# Patient Record
Sex: Male | Born: 1945 | Race: White | Hispanic: No | Marital: Married | Smoking: Never smoker
Health system: Southern US, Community
[De-identification: ages and names within clinical notes are randomized; demographics above are authoritative.]

## PROBLEM LIST (undated history)

## (undated) DIAGNOSIS — E119 Type 2 diabetes mellitus without complications: Secondary | ICD-10-CM

## (undated) DIAGNOSIS — Z9109 Other allergy status, other than to drugs and biological substances: Secondary | ICD-10-CM

## (undated) DIAGNOSIS — K219 Gastro-esophageal reflux disease without esophagitis: Secondary | ICD-10-CM

## (undated) DIAGNOSIS — I4510 Unspecified right bundle-branch block: Secondary | ICD-10-CM

## (undated) DIAGNOSIS — F32A Depression, unspecified: Secondary | ICD-10-CM

## (undated) DIAGNOSIS — N189 Chronic kidney disease, unspecified: Secondary | ICD-10-CM

## (undated) DIAGNOSIS — Z87442 Personal history of urinary calculi: Secondary | ICD-10-CM

## (undated) DIAGNOSIS — M1990 Unspecified osteoarthritis, unspecified site: Secondary | ICD-10-CM

## (undated) DIAGNOSIS — S81801A Unspecified open wound, right lower leg, initial encounter: Secondary | ICD-10-CM

## (undated) DIAGNOSIS — F329 Major depressive disorder, single episode, unspecified: Secondary | ICD-10-CM

## (undated) DIAGNOSIS — L509 Urticaria, unspecified: Secondary | ICD-10-CM

## (undated) DIAGNOSIS — J45909 Unspecified asthma, uncomplicated: Secondary | ICD-10-CM

## (undated) DIAGNOSIS — I10 Essential (primary) hypertension: Secondary | ICD-10-CM

## (undated) DIAGNOSIS — G4730 Sleep apnea, unspecified: Secondary | ICD-10-CM

## (undated) HISTORY — DX: Depression, unspecified: F32.A

## (undated) HISTORY — PX: JOINT REPLACEMENT: SHX530

## (undated) HISTORY — DX: Unspecified asthma, uncomplicated: J45.909

## (undated) HISTORY — DX: Chronic kidney disease, unspecified: N18.9

## (undated) HISTORY — PX: SPINE SURGERY: SHX786

## (undated) HISTORY — PX: TONSILLECTOMY: SUR1361

## (undated) HISTORY — PX: EYE SURGERY: SHX253

## (undated) HISTORY — DX: Essential (primary) hypertension: I10

## (undated) HISTORY — DX: Other allergy status, other than to drugs and biological substances: Z91.09

## (undated) HISTORY — PX: TONSILLECTOMY: SHX5217

## (undated) HISTORY — DX: Type 2 diabetes mellitus without complications: E11.9

## (undated) HISTORY — DX: Major depressive disorder, single episode, unspecified: F32.9

---

## 1993-12-05 HISTORY — PX: BACK SURGERY: SHX140

## 1999-09-18 ENCOUNTER — Ambulatory Visit: Admission: RE | Admit: 1999-09-18 | Discharge: 1999-09-18 | Payer: Self-pay | Admitting: Family Medicine

## 1999-10-22 ENCOUNTER — Ambulatory Visit (HOSPITAL_COMMUNITY): Admission: RE | Admit: 1999-10-22 | Discharge: 1999-10-22 | Payer: Self-pay | Admitting: Gastroenterology

## 2004-03-09 ENCOUNTER — Encounter: Admission: RE | Admit: 2004-03-09 | Discharge: 2004-06-07 | Payer: Self-pay | Admitting: Internal Medicine

## 2005-09-29 ENCOUNTER — Ambulatory Visit: Payer: Self-pay | Admitting: Internal Medicine

## 2008-06-02 ENCOUNTER — Encounter: Payer: Self-pay | Admitting: Cardiology

## 2008-06-12 ENCOUNTER — Ambulatory Visit: Payer: Self-pay | Admitting: Cardiology

## 2008-06-19 ENCOUNTER — Ambulatory Visit: Payer: Self-pay | Admitting: Cardiology

## 2008-06-19 ENCOUNTER — Ambulatory Visit: Payer: Self-pay

## 2008-06-19 ENCOUNTER — Encounter: Payer: Self-pay | Admitting: Cardiology

## 2008-06-19 LAB — CONVERTED CEMR LAB
BUN: 14 mg/dL (ref 6–23)
Creatinine, Ser: 0.8 mg/dL (ref 0.4–1.5)
GFR calc Af Amer: 127 mL/min
GFR calc non Af Amer: 105 mL/min

## 2008-09-18 ENCOUNTER — Ambulatory Visit: Payer: Self-pay | Admitting: Internal Medicine

## 2008-09-26 ENCOUNTER — Ambulatory Visit: Payer: Self-pay | Admitting: Internal Medicine

## 2008-11-20 ENCOUNTER — Ambulatory Visit: Payer: Self-pay | Admitting: Internal Medicine

## 2009-02-06 ENCOUNTER — Ambulatory Visit: Payer: Self-pay | Admitting: Internal Medicine

## 2009-03-12 ENCOUNTER — Ambulatory Visit: Payer: Self-pay | Admitting: Internal Medicine

## 2009-05-13 DIAGNOSIS — I10 Essential (primary) hypertension: Secondary | ICD-10-CM | POA: Insufficient documentation

## 2009-05-13 DIAGNOSIS — Z9109 Other allergy status, other than to drugs and biological substances: Secondary | ICD-10-CM

## 2009-05-13 DIAGNOSIS — E119 Type 2 diabetes mellitus without complications: Secondary | ICD-10-CM | POA: Insufficient documentation

## 2009-05-13 DIAGNOSIS — J45909 Unspecified asthma, uncomplicated: Secondary | ICD-10-CM | POA: Insufficient documentation

## 2009-05-13 DIAGNOSIS — F339 Major depressive disorder, recurrent, unspecified: Secondary | ICD-10-CM | POA: Insufficient documentation

## 2009-07-13 ENCOUNTER — Ambulatory Visit: Payer: Self-pay | Admitting: Internal Medicine

## 2009-09-04 ENCOUNTER — Ambulatory Visit: Payer: Self-pay | Admitting: Internal Medicine

## 2009-09-07 ENCOUNTER — Ambulatory Visit: Payer: Self-pay | Admitting: Internal Medicine

## 2009-12-28 ENCOUNTER — Ambulatory Visit: Payer: Self-pay | Admitting: Internal Medicine

## 2009-12-28 ENCOUNTER — Encounter: Admission: RE | Admit: 2009-12-28 | Discharge: 2009-12-28 | Payer: Self-pay | Admitting: Internal Medicine

## 2010-01-08 ENCOUNTER — Ambulatory Visit: Payer: Self-pay | Admitting: Internal Medicine

## 2010-03-01 ENCOUNTER — Ambulatory Visit: Payer: Self-pay | Admitting: Internal Medicine

## 2010-11-08 ENCOUNTER — Ambulatory Visit: Payer: Self-pay | Admitting: Gastroenterology

## 2010-11-09 LAB — PATHOLOGY REPORT

## 2010-11-15 LAB — HM COLONOSCOPY: HM Colonoscopy: NORMAL

## 2010-12-26 ENCOUNTER — Encounter: Payer: Self-pay | Admitting: Internal Medicine

## 2011-04-19 NOTE — Assessment & Plan Note (Signed)
[facility] OFFICE NOTE   JASMON, GRAFFAM                        MRN:          [id number]  DATE:06/12/2008                            DOB:          [birth year]    ADDENDUM   Instead of increasing the patient's Micardis, we will leave this at 80  mg p.o. daily, and we will add Norvasc 5 mg p.o. daily.  Note, he has an  allergy to HCTZ.     Madolyn Frieze [name], MD, [facility]  Electronically Signed    BSC/MedQ  DD: 06/12/2008  DT: 06/12/2008  Job #: [id number]   cc:   Luanna Cole. Lenord Fellers, M.D.

## 2011-04-19 NOTE — Assessment & Plan Note (Signed)
[facility] OFFICE NOTE   Larry Roman, Larry Roman                        MRN:          [id number]  DATE:06/12/2008                            DOB:          06-Dec-1945    The patient is a pleasant 65 year old male who I was asked to evaluate  for hypertension, episodes of dizziness, and diaphoresis.  The patient  has no prior cardiac history.  He typically does not have dyspnea on  exertion, orthopnea, PND, pedal edema, palpitations, presyncope,  syncope, or exertional chest pain.  He recently states that he was in  the sun at approximately 10 o'clock in the morning.  He otherwise is  doing very little activity.  He developed a sensation of feeling weak  and fatigued and also was diaphoretic.  There was mild nausea.  There is  no chest pain, palpitations, or shortness of breath.  He felt that this  was most likely heat exhaustion.  He went inside and his symptoms did  improve, but he felt weak for 2 days.  He had a second episode on May 23, 2008.  At this time, again he was having diaphoresis and weakness as  well as nausea, but there was no chest pain.  This lasted for several  hours and resolved spontaneously.  His blood pressure also apparently  has been elevated and ACT was added to his baseline regimen of Micardis  80 mg p.o. daily.  However, he apparently developed a rash and swelling  in his upper extremities with this and it was discontinued.  Because of  the above, we were asked to further evaluate.   MEDICATIONS:  1. Micardis 80 mg p.o. daily.  2. Cymbalta 30 mg p.o. daily.  3. Astelin nasal spray .  4. Zantac.  5. Loratadine 10 mg p.o. daily.   He has no known drug allergies.   SOCIAL HISTORY:  He does not smoke.  He consumes an occasional beer.   FAMILY HISTORY:  His mother died of myocardial infarction at age 33.  His father had congestive heart failure.   PAST MEDICAL HISTORY:  Significant for  diet-controlled diabetes mellitus  for approximately 2 years.  He also has hypertension.  There is no  hyperlipidemia by his report.  He has had a prior tonsillectomy as well  as back surgery.  He also has a history of asthma as a child and also  allergies.  There is a history of depression by his report.   REVIEW OF SYSTEMS:  He denies any headaches, fevers, or chills.  There  is no productive cough or hemoptysis.  There is no dysphagia,  odynophagia, melena, or hematochezia.  There is no dysuria or hematuria.  No rash or seizure activity.  No orthopnea, PND, or pedal edema.  The  remaining systems are negative.   PHYSICAL EXAMINATION:  VITAL SIGNS:  Today, blood pressure is 177/95 and  his pulse is 59.  He weighs 253 pounds.  GENERAL:  He is well developed and somewhat obese.  He is in no acute  distress  at present.  He does not appear to be depressed.  SKIN:  Warm and dry.  There is no peripheral clubbing.  BACK:  Normal.  HEENT:  Normal with normal eyelids.  NECK:  Supple with a normal upstroke bilaterally.  No bruits noted.  There is no jugular venous distention and I cannot appreciate  thyromegaly.  CHEST:  Clear to auscultation with normal expansion.  CARDIOVASCULAR:  Regular rhythm with a normal S1 and S2.  There is a 2/6  systolic murmur at left sternal border.  There is no diastolic murmur  noted.  There is no S3 or S4.  ABDOMINAL:  Nontender and nondistended.  Positive bowel sounds.  No  hepatosplenomegaly.  No mass appreciated.  There is no abdominal bruit.  He has 2+ femoral pulses bilaterally.  No bruits.  EXTREMITIES:  No edema.  I can palpate no cords.  He has 2+ posterior  tibial pulses bilaterally.  NEUROLOGIC:  Grossly intact.   I do have an electrocardiogram from Dr. Beryle Quant office dated June 02, 2008.  At that time, he was in a sinus rhythm with a normal axis and  there were nonspecific ST changes.   DIAGNOSES:  1. Episodes of dizziness, nausea, and  diaphoresis.  The patient feels      that these are most likely related to heat exhaustion.  He is also      diabetic, has hypertension.  We will schedule him for a stress      Myoview for risk stratification.  If this shows no ischemia, then      we will not pursue further cardiac workup.  2. Murmur - he will also receive an echocardiogram, although this      sounds to be potentially an ejection murmur.  3. Hypertension - his blood pressure is elevated today.  He states it      typically runs in the 130-140 range.  I have asked him to increase      his Micardis to 160 mg p.o. daily.  We will check a BMET in 1 week      to follow his potassium and renal function.  He will track his      blood pressure at home and keep records and follow up with Dr.      Lenord Fellers for further adjustment of his medical regimen as indicated.  4. History of depression.  5. History of allergies.  6. Diet-controlled diabetes mellitus - Management per Dr. Lenord Fellers.   If his Myoview and echocardiogram are unremarkable, then he will see [place]  back on an as-needed basis.     Madolyn Frieze [name], MD, [facility]  Electronically Signed    BSC/MedQ  DD: 06/12/2008  DT: 06/13/2008  Job #: [id number]   cc:   Luanna Cole. Lenord Fellers, M.D.

## 2011-05-26 ENCOUNTER — Encounter: Payer: Self-pay | Admitting: Cardiovascular Disease

## 2011-11-07 ENCOUNTER — Other Ambulatory Visit: Payer: Self-pay

## 2011-11-10 ENCOUNTER — Ambulatory Visit (INDEPENDENT_AMBULATORY_CARE_PROVIDER_SITE_OTHER): Payer: PRIVATE HEALTH INSURANCE | Admitting: Internal Medicine

## 2011-11-10 ENCOUNTER — Encounter: Payer: Self-pay | Admitting: Internal Medicine

## 2011-11-10 VITALS — BP 120/70 | HR 71 | Temp 97.7°F | Wt 236.0 lb

## 2011-11-10 DIAGNOSIS — Z23 Encounter for immunization: Secondary | ICD-10-CM

## 2011-11-10 DIAGNOSIS — M549 Dorsalgia, unspecified: Secondary | ICD-10-CM

## 2011-11-10 DIAGNOSIS — Z0000 Encounter for general adult medical examination without abnormal findings: Secondary | ICD-10-CM

## 2011-11-10 DIAGNOSIS — G4700 Insomnia, unspecified: Secondary | ICD-10-CM

## 2011-11-10 DIAGNOSIS — I10 Essential (primary) hypertension: Secondary | ICD-10-CM

## 2011-11-10 DIAGNOSIS — E119 Type 2 diabetes mellitus without complications: Secondary | ICD-10-CM

## 2011-11-10 MED ORDER — ZOLPIDEM TARTRATE 10 MG PO TABS: 5.0000 mg | ORAL_TABLET | Freq: Every evening | ORAL | Status: DC | PRN

## 2011-11-10 MED ORDER — TRIAMCINOLONE ACETONIDE 0.1 % EX CREA: 1.0000 "application " | TOPICAL_CREAM | Freq: Two times a day (BID) | CUTANEOUS | Status: DC | PRN

## 2011-11-10 MED ORDER — ZOSTER VACCINE LIVE 19400 UNT/0.65ML ~~LOC~~ SOLR: 0.6500 mL | Freq: Once | SUBCUTANEOUS | Status: AC

## 2011-11-10 MED ORDER — HYDROCODONE-ACETAMINOPHEN 10-650 MG PO TABS: 1.0000 | ORAL_TABLET | Freq: Four times a day (QID) | ORAL | Status: DC | PRN

## 2011-11-10 MED ORDER — AMLODIPINE BESYLATE 10 MG PO TABS: 10.0000 mg | ORAL_TABLET | Freq: Every day | ORAL | Status: DC

## 2011-11-10 MED ORDER — AZELASTINE HCL 0.1 % NA SOLN: 1.0000 | Freq: Two times a day (BID) | NASAL | Status: DC

## 2011-11-10 MED ORDER — FUROSEMIDE 20 MG PO TABS: 20.0000 mg | ORAL_TABLET | Freq: Every day | ORAL | Status: DC

## 2011-11-10 MED ORDER — DULOXETINE HCL 30 MG PO CPEP: 30.0000 mg | ORAL_CAPSULE | Freq: Every day | ORAL | Status: DC

## 2011-11-10 MED ORDER — CYCLOBENZAPRINE HCL 10 MG PO TABS: 10.0000 mg | ORAL_TABLET | Freq: Three times a day (TID) | ORAL | Status: DC | PRN

## 2011-11-10 MED ORDER — GLUCOSE BLOOD VI STRP: 1.0000 | ORAL_STRIP | Freq: Two times a day (BID) | Status: DC

## 2011-11-10 MED ORDER — LOSARTAN POTASSIUM 100 MG PO TABS: 100.0000 mg | ORAL_TABLET | Freq: Every day | ORAL | Status: DC

## 2011-11-10 NOTE — Progress Notes (Signed)
Subjective:    Patient ID: LOU LOEWE, male    DOB: 10-08-1946, 65 y.o.   MRN: [id number]  HPI Mr. Mota is a 65 year old male with a history of hypertension, diabetes, and insomnia who presents for followup. He reports that he has been doing well. In regards to his hypertension, he has not regularly been checking his blood pressure, however he reports full compliance with his medications. He notes that he has been exercising on a regular basis by walking.  In regards to his diabetes, he did not bring a record of his blood sugars today. His diabetes has typically been diet controlled. He is due for hemoglobin A1c.  In regards to his insomnia, he reports good control of Ambien. He denies any side effects from this medication.   Outpatient Encounter Prescriptions as of 11/10/2011  Medication Sig Dispense Refill  . amLODipine (NORVASC) 10 MG tablet Take 1 tablet (10 mg total) by mouth daily.  90 tablet  3  . aspirin EC 81 MG tablet Take 81 mg by mouth daily.        Marland Kitchen azelastine (ASTELIN) 137 MCG/SPRAY nasal spray Place 1 spray into the nose 2 (two) times daily. Use in each nostril as directed  90 mL  3  . cyclobenzaprine (FLEXERIL) 10 MG tablet Take 1 tablet (10 mg total) by mouth 3 (three) times daily as needed.  30 tablet  3  . DULoxetine (CYMBALTA) 30 MG capsule Take 1 capsule (30 mg total) by mouth daily.  90 capsule  3  . furosemide (LASIX) 20 MG tablet Take 1 tablet (20 mg total) by mouth daily.  90 tablet  3  . glucose blood (ONE TOUCH TEST STRIPS) test strip 1 each by Other route 2 (two) times daily. Use as instructed  100 each  3  . HYDROcodone-acetaminophen (LORCET) 10-650 MG per tablet Take 1 tablet by mouth every 6 (six) hours as needed.  60 tablet  3  . losartan (COZAAR) 100 MG tablet Take 1 tablet (100 mg total) by mouth daily.  90 tablet  3  . triamcinolone cream (KENALOG) 0.1 % Apply 1 application topically 2 (two) times daily as needed.  30 g  3  . zolpidem (AMBIEN) 10 MG tablet  Take 0.5-1 tablets (5-10 mg total) by mouth at bedtime as needed.  30 tablet  1    Review of Systems  Constitutional: Negative for fever, chills, activity change, appetite change, fatigue and unexpected weight change.  Eyes: Negative for visual disturbance.  Respiratory: Negative for cough and shortness of breath.   Cardiovascular: Negative for chest pain, palpitations and leg swelling.  Gastrointestinal: Negative for abdominal pain and abdominal distention.  Genitourinary: Negative for dysuria, urgency and difficulty urinating.  Musculoskeletal: Negative for arthralgias and gait problem.  Skin: Negative for color change and rash.  Hematological: Negative for adenopathy.  Psychiatric/Behavioral: Negative for sleep disturbance and dysphoric mood. The patient is not nervous/anxious.    BP 120/70  Pulse 71  Temp(Src) 97.7 F (36.5 C) (Oral)  Wt 236 lb (107.049 kg)  SpO2 96%     Objective:   Physical Exam  Constitutional: He is oriented to person, place, and time. He appears well-developed and well-nourished. No distress.  HENT:  Head: Normocephalic and atraumatic.  Right Ear: External ear normal.  Left Ear: External ear normal.  Nose: Nose normal.  Mouth/Throat: Oropharynx is clear and moist. No oropharyngeal exudate.  Eyes: Conjunctivae and EOM are normal. Pupils are equal, round, and reactive to  light. Right eye exhibits no discharge. Left eye exhibits no discharge. No scleral icterus.  Neck: Normal range of motion. Neck supple. No tracheal deviation present. No thyromegaly present.  Cardiovascular: Normal rate, regular rhythm and normal heart sounds.  Exam reveals no gallop and no friction rub.   No murmur heard. Pulmonary/Chest: Effort normal and breath sounds normal. No respiratory distress. He has no wheezes. He has no rales. He exhibits no tenderness.  Abdominal: Soft. Bowel sounds are normal. He exhibits no distension and no mass. There is no tenderness. There is no rebound  and no guarding.  Musculoskeletal: Normal range of motion. He exhibits no edema.  Lymphadenopathy:    He has no cervical adenopathy.  Neurological: He is alert and oriented to person, place, and time. No cranial nerve deficit. Coordination normal.  Skin: Skin is warm and dry. No rash noted. He is not diaphoretic. No erythema. No pallor.  Psychiatric: He has a normal mood and affect. His behavior is normal. Judgment and thought content normal.          Assessment & Plan:  1. Hypertension - blood pressure well-controlled today. We'll check renal function with labs. Will continue current medications. Patient will followup in 6 months.  2. Diabetes mellitus - will check hemoglobin A1c with labs today.  3. Insomnia - will continue Ambien. Refill given today.  4. Health maintenance - flu vaccine and tetanus and pertussis vaccines given today. Prescription for her shingles vaccine also given. Will check labs today including CBC, CMP, lipid profile, and hemoglobin A1c. We discussed that benefits and risk of checking PSA. Patient would like to check PSA with labs today. Patient is up-to-date on colonoscopy which was performed one year ago.

## 2011-11-11 LAB — COMPREHENSIVE METABOLIC PANEL
Alkaline Phosphatase: 66 U/L (ref 39–117)
BUN: 16 mg/dL (ref 6–23)
CO2: 30 mEq/L (ref 19–32)
GFR: 97.67 mL/min (ref >60.00)
Glucose, Bld: 98 mg/dL (ref 70–99)
Total Bilirubin: 1 mg/dL (ref 0.3–1.2)

## 2011-11-11 LAB — CBC WITH DIFFERENTIAL/PLATELET
Basophils Relative: 0.4 % (ref 0.0–3.0)
Eosinophils Absolute: 0.3 10*3/uL (ref 0.0–0.7)
HCT: 42 % (ref 39.0–52.0)
Lymphs Abs: 1 10*3/uL (ref 0.7–4.0)
MCHC: 34.3 g/dL (ref 30.0–36.0)
MCV: 93.2 fl (ref 78.0–100.0)
Monocytes Absolute: 0.4 10*3/uL (ref 0.1–1.0)
Neutrophils Relative %: 71.8 % (ref 43.0–77.0)
Platelets: 233 10*3/uL (ref 150.0–400.0)
RBC: 4.5 Mil/uL (ref 4.22–5.81)

## 2011-11-11 LAB — MICROALBUMIN / CREATININE URINE RATIO
Creatinine,U: 169.5 mg/dL
Microalb Creat Ratio: 1.7 mg/g (ref 0.0–30.0)
Microalb, Ur: 2.9 mg/dL — ABNORMAL HIGH (ref 0.0–1.9)

## 2011-11-11 LAB — LIPID PANEL: VLDL: 42 mg/dL — ABNORMAL HIGH (ref 0.0–40.0)

## 2011-11-11 LAB — HEMOGLOBIN A1C: Hgb A1c MFr Bld: 6.2 % (ref 4.6–6.5)

## 2011-11-11 LAB — PSA: PSA: 0.39 ng/mL (ref 0.10–4.00)

## 2011-12-08 ENCOUNTER — Other Ambulatory Visit: Payer: Self-pay | Admitting: *Deleted

## 2011-12-08 DIAGNOSIS — I10 Essential (primary) hypertension: Secondary | ICD-10-CM

## 2011-12-08 MED ORDER — LOSARTAN POTASSIUM 100 MG PO TABS: 100.0000 mg | ORAL_TABLET | Freq: Every day | ORAL | Status: DC

## 2011-12-22 ENCOUNTER — Other Ambulatory Visit: Payer: Self-pay | Admitting: *Deleted

## 2011-12-22 DIAGNOSIS — G4700 Insomnia, unspecified: Secondary | ICD-10-CM

## 2011-12-22 MED ORDER — ZOLPIDEM TARTRATE 10 MG PO TABS: 5.0000 mg | ORAL_TABLET | Freq: Every evening | ORAL | Status: DC | PRN

## 2012-02-07 ENCOUNTER — Encounter: Payer: Self-pay | Admitting: Internal Medicine

## 2012-02-15 ENCOUNTER — Encounter: Payer: Self-pay | Admitting: Internal Medicine

## 2012-05-10 ENCOUNTER — Ambulatory Visit: Payer: PRIVATE HEALTH INSURANCE | Admitting: Internal Medicine

## 2012-07-09 ENCOUNTER — Other Ambulatory Visit (INDEPENDENT_AMBULATORY_CARE_PROVIDER_SITE_OTHER): Payer: Medicare Other | Admitting: *Deleted

## 2012-07-09 ENCOUNTER — Telehealth: Payer: Self-pay | Admitting: *Deleted

## 2012-07-09 DIAGNOSIS — I10 Essential (primary) hypertension: Secondary | ICD-10-CM

## 2012-07-09 DIAGNOSIS — E119 Type 2 diabetes mellitus without complications: Secondary | ICD-10-CM

## 2012-07-09 NOTE — Addendum Note (Signed)
Addended by: Jobie Quaker on: 07/09/2012 05:23 PM   Modules accepted: Orders

## 2012-07-09 NOTE — Telephone Encounter (Signed)
CMP and lipids. Thanks!

## 2012-07-09 NOTE — Telephone Encounter (Signed)
[facility] advised per this note.

## 2012-07-09 NOTE — Telephone Encounter (Signed)
Patient came in today for labs, what would you like for me to order?

## 2012-07-10 LAB — LIPID PANEL
Cholesterol: 155 mg/dL (ref 0–200)
LDL Cholesterol: 84 mg/dL (ref 0–99)
Triglycerides: 157 mg/dL — ABNORMAL HIGH (ref 0.0–149.0)

## 2012-07-10 LAB — HEMOGLOBIN A1C: Hgb A1c MFr Bld: 6.4 % (ref 4.6–6.5)

## 2012-07-10 LAB — COMPREHENSIVE METABOLIC PANEL
ALT: 28 U/L (ref 0–53)
AST: 27 U/L (ref 0–37)
Alkaline Phosphatase: 54 U/L (ref 39–117)
Calcium: 9.5 mg/dL (ref 8.4–10.5)
Chloride: 98 mEq/L (ref 96–112)
Creatinine, Ser: 0.9 mg/dL (ref 0.4–1.5)
Potassium: 4 mEq/L (ref 3.5–5.1)

## 2012-07-16 ENCOUNTER — Ambulatory Visit (INDEPENDENT_AMBULATORY_CARE_PROVIDER_SITE_OTHER): Payer: Medicare Other | Admitting: Internal Medicine

## 2012-07-16 ENCOUNTER — Encounter: Payer: Self-pay | Admitting: Internal Medicine

## 2012-07-16 VITALS — BP 160/90 | HR 60 | Temp 98.2°F | Ht 72.0 in | Wt 234.5 lb

## 2012-07-16 DIAGNOSIS — Z23 Encounter for immunization: Secondary | ICD-10-CM | POA: Diagnosis not present

## 2012-07-16 DIAGNOSIS — M549 Dorsalgia, unspecified: Secondary | ICD-10-CM

## 2012-07-16 DIAGNOSIS — I10 Essential (primary) hypertension: Secondary | ICD-10-CM

## 2012-07-16 DIAGNOSIS — E119 Type 2 diabetes mellitus without complications: Secondary | ICD-10-CM

## 2012-07-16 DIAGNOSIS — G4700 Insomnia, unspecified: Secondary | ICD-10-CM | POA: Diagnosis not present

## 2012-07-16 MED ORDER — ZOLPIDEM TARTRATE 10 MG PO TABS: 5.0000 mg | ORAL_TABLET | Freq: Every evening | ORAL | Status: DC | PRN

## 2012-07-16 MED ORDER — AZELASTINE HCL 0.1 % NA SOLN: 1.0000 | Freq: Two times a day (BID) | NASAL | Status: DC

## 2012-07-16 MED ORDER — HYDROCODONE-ACETAMINOPHEN 10-650 MG PO TABS: 1.0000 | ORAL_TABLET | Freq: Four times a day (QID) | ORAL | Status: DC | PRN

## 2012-07-16 MED ORDER — DULOXETINE HCL 30 MG PO CPEP: 30.0000 mg | ORAL_CAPSULE | Freq: Every day | ORAL | Status: DC

## 2012-07-16 MED ORDER — FUROSEMIDE 20 MG PO TABS: 20.0000 mg | ORAL_TABLET | Freq: Every day | ORAL | Status: DC

## 2012-07-16 MED ORDER — AMLODIPINE BESYLATE 10 MG PO TABS: 10.0000 mg | ORAL_TABLET | Freq: Every day | ORAL | Status: DC

## 2012-07-16 MED ORDER — CYCLOBENZAPRINE HCL 10 MG PO TABS: 10.0000 mg | ORAL_TABLET | Freq: Three times a day (TID) | ORAL | Status: DC | PRN

## 2012-07-16 MED ORDER — LOSARTAN POTASSIUM 100 MG PO TABS: 100.0000 mg | ORAL_TABLET | Freq: Every day | ORAL | Status: DC

## 2012-07-16 NOTE — Assessment & Plan Note (Signed)
Blood sugars well controlled with hemoglobin A1c of 6.4%. Encouraged him to continue efforts at exercise, setting goals for 30 minutes of walking daily. Encouraged him to continue efforts at healthy diet. Followup in 6 months.

## 2012-07-16 NOTE — Assessment & Plan Note (Signed)
Blood pressure elevated today. Patient reports compliance with medication, but notes that this has been particularly anxious time for him. We will plan to recheck blood pressure in 4 weeks. He will also monitor at home and call if consistently greater than 140/90.

## 2012-07-16 NOTE — Progress Notes (Signed)
Subjective:    Patient ID: GUIDO COMP, male    DOB: November 16, 1946, 66 y.o.   MRN: [id number]  HPI 66 year old male with history of hypertension, elevated blood sugars presents for followup. He reports he is generally doing well. He reports full compliance with his medications. He reports that he has been walking 2 miles 5 days per week in effort to improve his health. He has also been limiting his intake of sweets and fatty foods.  Outpatient Encounter Prescriptions as of 07/16/2012  Medication Sig Dispense Refill  . amLODipine (NORVASC) 10 MG tablet Take 1 tablet (10 mg total) by mouth daily.  90 tablet  3  . aspirin EC 81 MG tablet Take 81 mg by mouth daily.        Marland Kitchen azelastine (ASTELIN) 137 MCG/SPRAY nasal spray Place 1 spray into the nose 2 (two) times daily. Use in each nostril as directed  90 mL  3  . cyclobenzaprine (FLEXERIL) 10 MG tablet Take 1 tablet (10 mg total) by mouth 3 (three) times daily as needed.  90 tablet  3  . DULoxetine (CYMBALTA) 30 MG capsule Take 1 capsule (30 mg total) by mouth daily.  90 capsule  3  . furosemide (LASIX) 20 MG tablet Take 1 tablet (20 mg total) by mouth daily.  90 tablet  3  . glucose blood (ONE TOUCH TEST STRIPS) test strip 1 each by Other route 2 (two) times daily. Use as instructed  100 each  3  . HYDROcodone-acetaminophen (LORCET) 10-650 MG per tablet Take 1 tablet by mouth every 6 (six) hours as needed.  60 tablet  3  . losartan (COZAAR) 100 MG tablet Take 1 tablet (100 mg total) by mouth daily.  90 tablet  3  . zolpidem (AMBIEN) 10 MG tablet Take 0.5-1 tablets (5-10 mg total) by mouth at bedtime as needed.  90 tablet  0   BP 160/90  Pulse 60  Temp 98.2 F (36.8 C) (Oral)  Ht 6' (1.829 m)  Wt 234 lb 8 oz (106.369 kg)  BMI 31.80 kg/m2  SpO2 98%  Review of Systems  Constitutional: Negative for fever, chills, activity change, appetite change, fatigue and unexpected weight change.  Eyes: Negative for visual disturbance.  Respiratory: Negative  for cough and shortness of breath.   Cardiovascular: Negative for chest pain, palpitations and leg swelling.  Gastrointestinal: Negative for abdominal pain and abdominal distention.  Genitourinary: Negative for dysuria, urgency and difficulty urinating.  Musculoskeletal: Negative for arthralgias and gait problem.  Skin: Negative for color change and rash.  Hematological: Negative for adenopathy.  Psychiatric/Behavioral: Negative for disturbed wake/sleep cycle and dysphoric mood. The patient is not nervous/anxious.        Objective:   Physical Exam  Constitutional: He is oriented to person, place, and time. He appears well-developed and well-nourished. No distress.  HENT:  Head: Normocephalic and atraumatic.  Right Ear: External ear normal.  Left Ear: External ear normal.  Nose: Nose normal.  Mouth/Throat: Oropharynx is clear and moist. No oropharyngeal exudate.  Eyes: Conjunctivae and EOM are normal. Pupils are equal, round, and reactive to light. Right eye exhibits no discharge. Left eye exhibits no discharge. No scleral icterus.  Neck: Normal range of motion. Neck supple. No tracheal deviation present. No thyromegaly present.  Cardiovascular: Normal rate, regular rhythm and normal heart sounds.  Exam reveals no gallop and no friction rub.   No murmur heard. Pulmonary/Chest: Effort normal and breath sounds normal. No respiratory distress. He has  no wheezes. He has no rales. He exhibits no tenderness.  Musculoskeletal: Normal range of motion. He exhibits no edema.  Lymphadenopathy:    He has no cervical adenopathy.  Neurological: He is alert and oriented to person, place, and time. No cranial nerve deficit. Coordination normal.  Skin: Skin is warm and dry. No rash noted. He is not diaphoretic. No erythema. No pallor.  Psychiatric: He has a normal mood and affect. His behavior is normal. Judgment and thought content normal.          Assessment & Plan:

## 2012-07-24 ENCOUNTER — Other Ambulatory Visit: Payer: Self-pay | Admitting: *Deleted

## 2012-07-24 MED ORDER — GLUCOSE BLOOD VI STRP: 1.0000 | ORAL_STRIP | Freq: Two times a day (BID) | Status: DC

## 2012-07-31 ENCOUNTER — Other Ambulatory Visit: Payer: Self-pay | Admitting: *Deleted

## 2012-07-31 MED ORDER — GLUCOSE BLOOD VI STRP: ORAL_STRIP | Status: DC

## 2012-08-30 ENCOUNTER — Encounter: Payer: Medicare Other | Admitting: Internal Medicine

## 2012-09-10 ENCOUNTER — Encounter: Payer: Self-pay | Admitting: Internal Medicine

## 2012-09-10 ENCOUNTER — Ambulatory Visit (INDEPENDENT_AMBULATORY_CARE_PROVIDER_SITE_OTHER): Payer: Medicare Other | Admitting: Internal Medicine

## 2012-09-10 VITALS — BP 150/90 | HR 61 | Temp 98.6°F | Ht 72.0 in | Wt 234.8 lb

## 2012-09-10 DIAGNOSIS — I10 Essential (primary) hypertension: Secondary | ICD-10-CM

## 2012-09-10 DIAGNOSIS — G4700 Insomnia, unspecified: Secondary | ICD-10-CM | POA: Diagnosis not present

## 2012-09-10 DIAGNOSIS — F3289 Other specified depressive episodes: Secondary | ICD-10-CM

## 2012-09-10 DIAGNOSIS — Z23 Encounter for immunization: Secondary | ICD-10-CM

## 2012-09-10 DIAGNOSIS — Z0000 Encounter for general adult medical examination without abnormal findings: Secondary | ICD-10-CM

## 2012-09-10 DIAGNOSIS — F329 Major depressive disorder, single episode, unspecified: Secondary | ICD-10-CM

## 2012-09-10 DIAGNOSIS — I4510 Unspecified right bundle-branch block: Secondary | ICD-10-CM

## 2012-09-10 DIAGNOSIS — E119 Type 2 diabetes mellitus without complications: Secondary | ICD-10-CM

## 2012-09-10 DIAGNOSIS — N529 Male erectile dysfunction, unspecified: Secondary | ICD-10-CM

## 2012-09-10 MED ORDER — VARDENAFIL HCL 5 MG PO TABS: 5.0000 mg | ORAL_TABLET | ORAL | Status: DC | PRN

## 2012-09-10 MED ORDER — AZELASTINE HCL 0.1 % NA SOLN: 1.0000 | Freq: Two times a day (BID) | NASAL | Status: DC

## 2012-09-10 MED ORDER — LOSARTAN POTASSIUM 100 MG PO TABS: 100.0000 mg | ORAL_TABLET | Freq: Every day | ORAL | Status: DC

## 2012-09-10 MED ORDER — ZOLPIDEM TARTRATE 10 MG PO TABS: 5.0000 mg | ORAL_TABLET | Freq: Every evening | ORAL | Status: DC | PRN

## 2012-09-10 NOTE — Assessment & Plan Note (Signed)
Symptoms well controlled with Cymbalta. Will continue. Follow up 6 months and prn.

## 2012-09-10 NOTE — Assessment & Plan Note (Signed)
New RBBB noted on EKG today. Asymptomatic. However, some risk for CAD including diabetes mellitus, HTN. Will set up cardiology evaluation with possible stress test.

## 2012-09-10 NOTE — Assessment & Plan Note (Addendum)
General medical exam normal today. Health maintenance is UTD including colonoscopy, vaccinations.  Appropriate screening performed. Information on HCPOA given. Encouraged continued efforts at healthy diet and exercise.  Follow up 6 months and prn.

## 2012-09-10 NOTE — Assessment & Plan Note (Signed)
BP slightly elevated today, but well controlled at home. Will continue current medications. Follow up 6 months and prn.

## 2012-09-10 NOTE — Progress Notes (Signed)
Subjective:    Patient ID: Larry Roman, male    DOB: 06-Mar-1946, 66 y.o.   MRN: [id number]  HPI The patient is here for annual Medicare wellness examination and management of other chronic and acute problems.   The risk factors are reflected in the social history.  The roster of all physicians providing medical care to patient - is listed in the Snapshot section of the chart.  Activities of daily living:  The patient is 100% independent in all ADLs: dressing, toileting, feeding as well as independent mobility  Home safety : The patient has smoke detectors in the home. They wear seatbelts.  There are locked firearms at home. There is no violence in the home.   There is no risks for hepatitis, STDs or HIV. There is no history of blood transfusion. They have no travel history to infectious disease endemic areas of the world.  The patient has seen their dentist in the last six month. (Dr. Judy Pimple) They have seen their eye doctor in the last year. [facility]) No trouble with hearing  They have deferred audiologic testing in the last year.   They do not  have excessive sun exposure. Discussed the need for sun protection: hats, long sleeves and use of sunscreen if there is significant sun exposure.   Diet: the importance of a healthy diet is discussed. They do have a healthy diet.  The benefits of regular aerobic exercise were discussed. He walks 5 days per week 35-46min.   Depression screen: there are no signs or vegative symptoms of depression- irritability, change in appetite, anhedonia, sadness/tearfullness.  Cognitive assessment: the patient manages all their financial and personal affairs and is actively engaged. They could relate day,date,year and events.  Living Will in Place. HCPOA in place.  The following portions of the patient's history were reviewed and updated as appropriate: allergies, current medications, past family history, past medical history,  past surgical  history, past social history  and problem list.  Visual acuity was not assessed per patient preference since he has regular follow up with her ophthalmologist. Hearing and body mass index were assessed and reviewed.   During the course of the visit the patient was educated and counseled about appropriate screening and preventive services including : fall prevention , diabetes screening, nutrition counseling, colorectal cancer screening, and recommended immunizations.    He is also concerned today about some erectile dysfunction. He notes difficulty establishing and maintaining an erection during intercourse. He has never taken medications for this.  In regards to chronic issue of hypertension, he brings record of blood pressures which show average blood pressure 140/80.  Outpatient Encounter Prescriptions as of 09/10/2012  Medication Sig Dispense Refill  . amLODipine (NORVASC) 10 MG tablet Take 1 tablet (10 mg total) by mouth daily.  90 tablet  3  . aspirin EC 81 MG tablet Take 81 mg by mouth daily.        Marland Kitchen azelastine (ASTELIN) 137 MCG/SPRAY nasal spray Place 1 spray into the nose 2 (two) times daily. Use in each nostril as directed  90 mL  3  . cyclobenzaprine (FLEXERIL) 10 MG tablet Take 1 tablet (10 mg total) by mouth 3 (three) times daily as needed.  90 tablet  3  . DULoxetine (CYMBALTA) 30 MG capsule Take 1 capsule (30 mg total) by mouth daily.  90 capsule  3  . furosemide (LASIX) 20 MG tablet Take 1 tablet (20 mg total) by mouth daily.  90 tablet  3  . glucose blood test strip One Touch Test strips Dx: 250.00 Use to check blood sugar 2-3 times daily  100 each  11  . HYDROcodone-acetaminophen (LORCET) 10-650 MG per tablet Take 1 tablet by mouth every 6 (six) hours as needed.  60 tablet  3  . losartan (COZAAR) 100 MG tablet Take 1 tablet (100 mg total) by mouth daily.  90 tablet  3  . zolpidem (AMBIEN) 10 MG tablet Take 0.5-1 tablets (5-10 mg total) by mouth at bedtime as needed.  90  tablet  0   BP 150/90  Pulse 61  Temp 98.6 F (37 C) (Oral)  Ht 6' (1.829 m)  Wt 234 lb 12 oz (106.482 kg)  BMI 31.84 kg/m2  SpO2 96%  Review of Systems  Constitutional: Negative for fever, chills, activity change, appetite change, fatigue and unexpected weight change.  Eyes: Negative for visual disturbance.  Respiratory: Negative for cough and shortness of breath.   Cardiovascular: Negative for chest pain, palpitations and leg swelling.  Gastrointestinal: Negative for abdominal pain and abdominal distention.  Genitourinary: Negative for dysuria, urgency and difficulty urinating.  Musculoskeletal: Negative for arthralgias and gait problem.  Skin: Negative for color change and rash.  Hematological: Negative for adenopathy.  Psychiatric/Behavioral: Negative for disturbed wake/sleep cycle and dysphoric mood. The patient is not nervous/anxious.        Objective:   Physical Exam  Constitutional: He is oriented to person, place, and time. He appears well-developed and well-nourished. No distress.  HENT:  Head: Normocephalic and atraumatic.  Right Ear: External ear normal.  Left Ear: External ear normal.  Nose: Nose normal.  Mouth/Throat: Oropharynx is clear and moist. No oropharyngeal exudate.  Eyes: Conjunctivae normal and EOM are normal. Pupils are equal, round, and reactive to light. Right eye exhibits no discharge. Left eye exhibits no discharge. No scleral icterus.  Neck: Normal range of motion. Neck supple. No tracheal deviation present. No thyromegaly present.  Cardiovascular: Normal rate, regular rhythm and normal heart sounds.  Exam reveals no gallop and no friction rub.   No murmur heard. Pulmonary/Chest: Effort normal and breath sounds normal. No respiratory distress. He has no wheezes. He has no rales. He exhibits no tenderness.  Abdominal: Soft. Bowel sounds are normal. He exhibits no distension and no mass. There is no tenderness. There is no guarding.    Musculoskeletal: Normal range of motion. He exhibits no edema.  Lymphadenopathy:    He has no cervical adenopathy.  Neurological: He is alert and oriented to person, place, and time. No cranial nerve deficit. Coordination normal.  Skin: Skin is warm and dry. No rash noted. He is not diaphoretic. No erythema. No pallor.  Psychiatric: He has a normal mood and affect. His behavior is normal. Judgment and thought content normal.          Assessment & Plan:

## 2012-09-10 NOTE — Assessment & Plan Note (Signed)
Borderline diabetes. Diet controlled. Recent A1c showed excellent control of BG. Will continue to monitor. Follow up 6 months.

## 2012-09-10 NOTE — Assessment & Plan Note (Signed)
Symptoms of erectile dysfunction likely related to use of SSRI. Will try adding Levitra to see if any improvement. Follow up 6 months and prn.

## 2012-09-13 ENCOUNTER — Encounter: Payer: Self-pay | Admitting: Internal Medicine

## 2012-09-17 ENCOUNTER — Encounter: Payer: Self-pay | Admitting: Internal Medicine

## 2012-09-20 ENCOUNTER — Telehealth: Payer: Self-pay | Admitting: Internal Medicine

## 2012-09-20 NOTE — Telephone Encounter (Signed)
Abnormal EKG  

## 2012-09-21 ENCOUNTER — Encounter: Payer: Self-pay | Admitting: Internal Medicine

## 2012-09-24 ENCOUNTER — Encounter: Payer: Self-pay | Admitting: Internal Medicine

## 2012-09-24 DIAGNOSIS — R9431 Abnormal electrocardiogram [ECG] [EKG]: Secondary | ICD-10-CM

## 2012-10-15 ENCOUNTER — Ambulatory Visit (INDEPENDENT_AMBULATORY_CARE_PROVIDER_SITE_OTHER): Payer: Medicare Other | Admitting: Cardiovascular Disease

## 2012-10-15 ENCOUNTER — Encounter: Payer: Self-pay | Admitting: Cardiovascular Disease

## 2012-10-15 VITALS — BP 112/70 | HR 74 | Ht 72.0 in | Wt 233.2 lb

## 2012-10-15 DIAGNOSIS — I4510 Unspecified right bundle-branch block: Secondary | ICD-10-CM

## 2012-10-15 DIAGNOSIS — E119 Type 2 diabetes mellitus without complications: Secondary | ICD-10-CM | POA: Diagnosis not present

## 2012-10-15 DIAGNOSIS — I10 Essential (primary) hypertension: Secondary | ICD-10-CM

## 2012-10-15 NOTE — Assessment & Plan Note (Signed)
We have encouraged continued exercise, careful diet management in an effort to lose weight. 

## 2012-10-15 NOTE — Assessment & Plan Note (Signed)
Likely a benign finding in the setting of no symptoms. He is active, exercises on a regular basis. His other risk factors are well controlled with excellent cholesterol, low blood pressure and hemoglobin A1c. Nonsmoker. We have discussed the various treatment options with him which included medical management as he is doing and possibly a stress test. After much discussion, stress test will not be ordered at this time as he is asymptomatic. He will call us if he has any symptoms of shortness of breath or chest discomfort concerning for angina.

## 2012-10-15 NOTE — Progress Notes (Signed)
Patient ID: Larry Roman, male    DOB: 02/24/1946, 66 y.o.   MRN: [id number]  HPI Comments: 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 He is very active at baseline. 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 He does have a relatively vigorous space with no symptoms. Last weekend, he went "zip lining"and [place] and reported no problems.   He was surprised to hear he had a new change noted on his EKG.  Prior EKGs from 2009 showed normal sinus rhythm. Recent EKG showed new right bundle branch block since 2009. Repeat EKG today shows normal sinus rhythm with rate 74 beats per minute, right bundle branch block, no other significant ST or T wave changes     Outpatient Encounter Prescriptions as of 10/15/2012  Medication Sig Dispense Refill  . amLODipine (NORVASC) 10 MG tablet Take 1 tablet (10 mg total) by mouth daily.  90 tablet  3  . aspirin EC 81 MG tablet Take 81 mg by mouth daily.        Marland Kitchen azelastine (ASTELIN) 137 MCG/SPRAY nasal spray Place 1 spray into the nose 2 (two) times daily. Use in each nostril as directed  90 mL  3  . cyclobenzaprine (FLEXERIL) 10 MG tablet Take 1 tablet (10 mg total) by mouth 3 (three) times daily as needed.  90 tablet  3  . DULoxetine (CYMBALTA) 30 MG capsule Take 1 capsule (30 mg total) by mouth daily.  90 capsule  3  . furosemide (LASIX) 20 MG tablet Take 1 tablet (20 mg total) by mouth daily.  90 tablet  3  . glucose blood test strip One Touch Test strips Dx: 250.00 Use to check blood sugar 2-3 times daily  100 each  11  . HYDROcodone-acetaminophen (LORCET) 10-650 MG per tablet Take 1 tablet by mouth every 6 (six) hours as  needed.  60 tablet  3  . losartan (COZAAR) 100 MG tablet Take 1 tablet (100 mg total) by mouth daily.  90 tablet  3  . vardenafil (LEVITRA) 5 MG tablet Take 1 tablet (5 mg total) by mouth as needed for erectile dysfunction.  10 tablet  0  . zolpidem (AMBIEN) 10 MG tablet Take 0.5-1 tablets (5-10 mg total) by mouth at bedtime as needed.  90 tablet  0    Review of Systems  Constitutional: Negative.   HENT: Negative.   Eyes: Negative.   Respiratory: Negative.   Cardiovascular: Negative.   Gastrointestinal: Negative.   Musculoskeletal: Negative.   Skin: Negative.   Neurological: Negative.   Hematological: Negative.   Psychiatric/Behavioral: Negative.   All other systems reviewed and are negative.    BP 112/70  Pulse 74  Ht 6' (1.829 m)  Wt 233 lb 4 oz (105.802 kg)  BMI 31.63 kg/m2  Physical Exam  Nursing note and vitals reviewed. Constitutional: He is oriented to person, place, and time. He appears well-developed and well-nourished.  HENT:  Head: Normocephalic.  Nose: Nose normal.  Mouth/Throat: Oropharynx is clear and moist.  Eyes: Conjunctivae normal are normal. Pupils are equal, round, and reactive to light.  Neck: Normal range of motion. Neck supple. No JVD present.  Cardiovascular: Normal rate, regular  rhythm, S1 normal, S2 normal, normal heart sounds and intact distal pulses.  Exam reveals no gallop and no friction rub.   No murmur heard. Pulmonary/Chest: Effort normal and breath sounds normal. No respiratory distress. He has no wheezes. He has no rales. He exhibits no tenderness.  Abdominal: Soft. Bowel sounds are normal. He exhibits no distension. There is no tenderness.  Musculoskeletal: Normal range of motion. He exhibits no edema and no tenderness.  Lymphadenopathy:    He has no cervical adenopathy.  Neurological: He is alert and oriented to person, place, and time. Coordination normal.  Skin: Skin is warm and dry. No rash noted. No erythema.  Psychiatric: He has  a normal mood and affect. His behavior is normal. Judgment and thought content normal.           Assessment and Plan

## 2012-10-15 NOTE — Patient Instructions (Addendum)
You are doing well. No medication changes were made.  No further testing at this time Please call the office if you have shortness of breath or chest pain with exertion  Please call us if you have new issues that need to be addressed before your next appt.

## 2012-10-15 NOTE — Assessment & Plan Note (Signed)
Blood pressure is well controlled on today's visit. No changes made to the medications. 

## 2012-12-14 ENCOUNTER — Encounter: Payer: Self-pay | Admitting: Internal Medicine

## 2012-12-14 ENCOUNTER — Telehealth: Payer: Self-pay | Admitting: Internal Medicine

## 2012-12-14 ENCOUNTER — Ambulatory Visit (INDEPENDENT_AMBULATORY_CARE_PROVIDER_SITE_OTHER): Payer: Medicare Other | Admitting: Internal Medicine

## 2012-12-14 VITALS — BP 170/92 | HR 77 | Temp 98.2°F | Ht 72.0 in | Wt 235.8 lb

## 2012-12-14 DIAGNOSIS — N390 Urinary tract infection, site not specified: Secondary | ICD-10-CM

## 2012-12-14 LAB — POCT URINALYSIS DIPSTICK
Protein, UA: 30
Spec Grav, UA: 1.015
Urobilinogen, UA: 0.2

## 2012-12-14 MED ORDER — CIPROFLOXACIN HCL 500 MG PO TABS: 500.0000 mg | ORAL_TABLET | Freq: Two times a day (BID) | ORAL | Status: DC

## 2012-12-14 NOTE — Telephone Encounter (Signed)
Repeat UA next week

## 2012-12-14 NOTE — Assessment & Plan Note (Signed)
Symptoms and urinalysis consistent with UTI. Will treat empirically with cipro x 7 days. Will send urine for culture. Will repeat UA in 1 week to ensure clearance. We discussed potential longer course of treatment given risk prostatitis if UA positive in 1 week.

## 2012-12-14 NOTE — Progress Notes (Signed)
Subjective:    Patient ID: Larry Roman, male    DOB: 1946/03/31, 67 y.o.   MRN: [id number]  HPI 67 year old male with history of hypertension presents for acute visit complaining of several days of increased urinary frequency, urgency, dysuria, and low back pain. He denies any fever or chills. He has been taking Pyridium with some improvement in symptoms.  Outpatient Encounter Prescriptions as of 12/14/2012  Medication Sig Dispense Refill  . amLODipine (NORVASC) 10 MG tablet Take 1 tablet (10 mg total) by mouth daily.  90 tablet  3  . aspirin EC 81 MG tablet Take 81 mg by mouth daily.        Marland Kitchen azelastine (ASTELIN) 137 MCG/SPRAY nasal spray Place 1 spray into the nose 2 (two) times daily. Use in each nostril as directed  90 mL  3  . cyclobenzaprine (FLEXERIL) 10 MG tablet Take 1 tablet (10 mg total) by mouth 3 (three) times daily as needed.  90 tablet  3  . DULoxetine (CYMBALTA) 30 MG capsule Take 1 capsule (30 mg total) by mouth daily.  90 capsule  3  . furosemide (LASIX) 20 MG tablet Take 1 tablet (20 mg total) by mouth daily.  90 tablet  3  . glucose blood test strip One Touch Test strips Dx: 250.00 Use to check blood sugar 2-3 times daily  100 each  11  . HYDROcodone-acetaminophen (LORCET) 10-650 MG per tablet Take 1 tablet by mouth every 6 (six) hours as needed.  60 tablet  3  . losartan (COZAAR) 100 MG tablet Take 1 tablet (100 mg total) by mouth daily.  90 tablet  3  . vardenafil (LEVITRA) 5 MG tablet Take 1 tablet (5 mg total) by mouth as needed for erectile dysfunction.  10 tablet  0  . zolpidem (AMBIEN) 10 MG tablet Take 0.5-1 tablets (5-10 mg total) by mouth at bedtime as needed.  90 tablet  0   BP 170/92  Pulse 77  Temp 98.2 F (36.8 C) (Oral)  Ht 6' (1.829 m)  Wt 235 lb 12 oz (106.935 kg)  BMI 31.97 kg/m2  SpO2 96%  Review of Systems  Constitutional: Negative for fever, chills, activity change, appetite change, fatigue and unexpected weight change.  Eyes: Negative for  visual disturbance.  Respiratory: Negative for cough and shortness of breath.   Cardiovascular: Negative for chest pain, palpitations and leg swelling.  Gastrointestinal: Negative for abdominal pain and abdominal distention.  Genitourinary: Positive for dysuria, urgency and frequency. Negative for hematuria, penile swelling, scrotal swelling, difficulty urinating, penile pain and testicular pain.  Musculoskeletal: Positive for back pain (low back). Negative for arthralgias and gait problem.  Skin: Negative for color change and rash.  Hematological: Negative for adenopathy.  Psychiatric/Behavioral: Negative for sleep disturbance and dysphoric mood. The patient is not nervous/anxious.        Objective:   Physical Exam  Constitutional: He is oriented to person, place, and time. He appears well-developed and well-nourished. No distress.  HENT:  Head: Normocephalic and atraumatic.  Right Ear: External ear normal.  Left Ear: External ear normal.  Nose: Nose normal.  Mouth/Throat: Oropharynx is clear and moist.  Eyes: Conjunctivae normal and EOM are normal. Pupils are equal, round, and reactive to light. Right eye exhibits no discharge. Left eye exhibits no discharge. No scleral icterus.  Neck: Normal range of motion. Neck supple. No tracheal deviation present. No thyromegaly present.  Cardiovascular: Normal rate, regular rhythm and normal heart sounds.  Exam reveals no  gallop and no friction rub.   No murmur heard. Pulmonary/Chest: Effort normal and breath sounds normal. No respiratory distress. He has no wheezes. He has no rales. He exhibits no tenderness.  Abdominal: There is no tenderness (no CVA tenderness).  Musculoskeletal: Normal range of motion. He exhibits no edema.  Lymphadenopathy:    He has no cervical adenopathy.  Neurological: He is alert and oriented to person, place, and time. No cranial nerve deficit. Coordination normal.  Skin: Skin is warm and dry. No rash noted. He is not  diaphoretic. No erythema. No pallor.  Psychiatric: He has a normal mood and affect. His behavior is normal. Judgment and thought content normal.          Assessment & Plan:

## 2012-12-16 ENCOUNTER — Encounter: Payer: Self-pay | Admitting: Internal Medicine

## 2012-12-17 ENCOUNTER — Encounter: Payer: Self-pay | Admitting: Internal Medicine

## 2012-12-20 ENCOUNTER — Telehealth: Payer: Self-pay | Admitting: *Deleted

## 2012-12-20 NOTE — Telephone Encounter (Signed)
This pt is coming in for labs tomorrow (01.17.2014). Was it just for a urine sample?

## 2012-12-20 NOTE — Telephone Encounter (Signed)
Repeat urinalysis because of hematuria.

## 2012-12-21 ENCOUNTER — Telehealth: Payer: Self-pay | Admitting: *Deleted

## 2012-12-21 ENCOUNTER — Other Ambulatory Visit (HOSPITAL_COMMUNITY)
Admission: RE | Admit: 2012-12-21 | Discharge: 2012-12-21 | Disposition: A | Discharge status: Home / Self Care | Payer: Medicare Other | Source: Ambulatory Visit | Attending: Internal Medicine | Admitting: Internal Medicine

## 2012-12-21 ENCOUNTER — Other Ambulatory Visit (INDEPENDENT_AMBULATORY_CARE_PROVIDER_SITE_OTHER): Payer: Medicare Other

## 2012-12-21 DIAGNOSIS — R319 Hematuria, unspecified: Secondary | ICD-10-CM | POA: Insufficient documentation

## 2012-12-21 DIAGNOSIS — D414 Neoplasm of uncertain behavior of bladder: Secondary | ICD-10-CM | POA: Diagnosis not present

## 2012-12-21 LAB — POCT URINALYSIS DIPSTICK
Bilirubin, UA: NEGATIVE
Glucose, UA: NEGATIVE
Nitrite, UA: NEGATIVE
Spec Grav, UA: <=1.005

## 2012-12-21 NOTE — Telephone Encounter (Signed)
Yes, please.

## 2012-12-21 NOTE — Telephone Encounter (Signed)
For this pt would you like a urine culture done?  

## 2012-12-23 LAB — URINE CULTURE
Colony Count: NO GROWTH
Organism ID, Bacteria: NO GROWTH

## 2012-12-24 ENCOUNTER — Other Ambulatory Visit: Payer: Self-pay | Admitting: Internal Medicine

## 2012-12-24 ENCOUNTER — Encounter: Payer: Self-pay | Admitting: Internal Medicine

## 2012-12-24 DIAGNOSIS — R319 Hematuria, unspecified: Secondary | ICD-10-CM

## 2012-12-24 DIAGNOSIS — R8289 Other abnormal findings on cytological and histological examination of urine: Secondary | ICD-10-CM

## 2012-12-27 ENCOUNTER — Other Ambulatory Visit: Payer: Medicare Other

## 2012-12-28 ENCOUNTER — Ambulatory Visit: Payer: Medicare Other | Admitting: Internal Medicine

## 2013-01-01 DIAGNOSIS — N410 Acute prostatitis: Secondary | ICD-10-CM | POA: Diagnosis not present

## 2013-01-01 DIAGNOSIS — N401 Enlarged prostate with lower urinary tract symptoms: Secondary | ICD-10-CM | POA: Diagnosis not present

## 2013-01-14 ENCOUNTER — Encounter: Payer: Self-pay | Admitting: Internal Medicine

## 2013-01-14 DIAGNOSIS — N390 Urinary tract infection, site not specified: Secondary | ICD-10-CM

## 2013-01-14 MED ORDER — CIPROFLOXACIN HCL 500 MG PO TABS: 500.0000 mg | ORAL_TABLET | Freq: Two times a day (BID) | ORAL | Status: DC

## 2013-01-15 ENCOUNTER — Encounter: Payer: Self-pay | Admitting: Internal Medicine

## 2013-01-15 ENCOUNTER — Other Ambulatory Visit: Payer: Medicare Other

## 2013-01-15 DIAGNOSIS — N390 Urinary tract infection, site not specified: Secondary | ICD-10-CM

## 2013-01-16 ENCOUNTER — Encounter: Payer: Self-pay | Admitting: Internal Medicine

## 2013-01-16 ENCOUNTER — Other Ambulatory Visit: Payer: Medicare Other

## 2013-01-18 LAB — URINE CULTURE: Organism ID, Bacteria: NO GROWTH

## 2013-01-28 ENCOUNTER — Other Ambulatory Visit: Payer: Self-pay | Admitting: Internal Medicine

## 2013-01-28 ENCOUNTER — Encounter: Payer: Self-pay | Admitting: Internal Medicine

## 2013-01-28 NOTE — Telephone Encounter (Signed)
If pt is not completely out - I would hold this refill for Dr Dan Humphreys since she will return in 2 days.  (since the rx is for 90 tablets. )

## 2013-01-29 ENCOUNTER — Encounter: Payer: Self-pay | Admitting: Internal Medicine

## 2013-01-29 DIAGNOSIS — G4700 Insomnia, unspecified: Secondary | ICD-10-CM

## 2013-01-29 NOTE — Telephone Encounter (Signed)
This has been addressed in a previous encounter.

## 2013-01-30 MED ORDER — ZOLPIDEM TARTRATE 10 MG PO TABS: 5.0000 mg | ORAL_TABLET | Freq: Every evening | ORAL | Status: DC | PRN

## 2013-01-30 NOTE — Telephone Encounter (Signed)
Fine to fill. 

## 2013-01-30 NOTE — Telephone Encounter (Signed)
Requesting refill on:    Ambien

## 2013-01-30 NOTE — Telephone Encounter (Signed)
Rx has been sent to the pharmacy

## 2013-01-31 ENCOUNTER — Encounter: Payer: Self-pay | Admitting: Internal Medicine

## 2013-02-22 ENCOUNTER — Encounter: Payer: Self-pay | Admitting: Internal Medicine

## 2013-02-22 ENCOUNTER — Telehealth: Payer: Self-pay | Admitting: Internal Medicine

## 2013-02-22 NOTE — Telephone Encounter (Signed)
notes

## 2013-03-04 ENCOUNTER — Other Ambulatory Visit (INDEPENDENT_AMBULATORY_CARE_PROVIDER_SITE_OTHER): Payer: Medicare Other

## 2013-03-04 ENCOUNTER — Encounter: Payer: Self-pay | Admitting: Internal Medicine

## 2013-03-04 DIAGNOSIS — Z139 Encounter for screening, unspecified: Secondary | ICD-10-CM

## 2013-03-04 LAB — POCT URINALYSIS DIPSTICK
Glucose, UA: NEGATIVE
Leukocytes, UA: NEGATIVE
Nitrite, UA: NEGATIVE
Urobilinogen, UA: 0.2
pH, UA: 5.5

## 2013-03-07 ENCOUNTER — Encounter: Payer: Self-pay | Admitting: Internal Medicine

## 2013-03-22 ENCOUNTER — Ambulatory Visit: Payer: Medicare Other | Admitting: Internal Medicine

## 2013-03-25 ENCOUNTER — Encounter: Payer: Self-pay | Admitting: Internal Medicine

## 2013-03-25 ENCOUNTER — Ambulatory Visit (INDEPENDENT_AMBULATORY_CARE_PROVIDER_SITE_OTHER): Payer: Medicare Other | Admitting: Internal Medicine

## 2013-03-25 VITALS — BP 156/96 | HR 84 | Temp 98.4°F | Wt 233.0 lb

## 2013-03-25 DIAGNOSIS — R300 Dysuria: Secondary | ICD-10-CM | POA: Diagnosis not present

## 2013-03-25 DIAGNOSIS — Z9109 Other allergy status, other than to drugs and biological substances: Secondary | ICD-10-CM

## 2013-03-25 DIAGNOSIS — I10 Essential (primary) hypertension: Secondary | ICD-10-CM

## 2013-03-25 DIAGNOSIS — R5383 Other fatigue: Secondary | ICD-10-CM | POA: Diagnosis not present

## 2013-03-25 DIAGNOSIS — R5381 Other malaise: Secondary | ICD-10-CM

## 2013-03-25 DIAGNOSIS — E119 Type 2 diabetes mellitus without complications: Secondary | ICD-10-CM | POA: Diagnosis not present

## 2013-03-25 DIAGNOSIS — G4700 Insomnia, unspecified: Secondary | ICD-10-CM | POA: Diagnosis not present

## 2013-03-25 DIAGNOSIS — M549 Dorsalgia, unspecified: Secondary | ICD-10-CM

## 2013-03-25 DIAGNOSIS — F329 Major depressive disorder, single episode, unspecified: Secondary | ICD-10-CM | POA: Diagnosis not present

## 2013-03-25 DIAGNOSIS — J45909 Unspecified asthma, uncomplicated: Secondary | ICD-10-CM

## 2013-03-25 DIAGNOSIS — N390 Urinary tract infection, site not specified: Secondary | ICD-10-CM

## 2013-03-25 DIAGNOSIS — I4510 Unspecified right bundle-branch block: Secondary | ICD-10-CM

## 2013-03-25 DIAGNOSIS — N529 Male erectile dysfunction, unspecified: Secondary | ICD-10-CM

## 2013-03-25 LAB — POCT URINALYSIS DIPSTICK
Blood, UA: NEGATIVE
Glucose, UA: NEGATIVE
Nitrite, UA: NEGATIVE
Protein, UA: NEGATIVE
Urobilinogen, UA: 0.2

## 2013-03-25 LAB — VITAMIN B12: Vitamin B-12: 268 pg/mL (ref 211–911)

## 2013-03-25 LAB — COMPREHENSIVE METABOLIC PANEL
ALT: 35 U/L (ref 0–53)
AST: 28 U/L (ref 0–37)
Albumin: 4.6 g/dL (ref 3.5–5.2)
BUN: 9 mg/dL (ref 6–23)
Calcium: 9.3 mg/dL (ref 8.4–10.5)
Chloride: 99 mEq/L (ref 96–112)
Potassium: 3.9 mEq/L (ref 3.5–5.1)
Sodium: 137 mEq/L (ref 135–145)
Total Protein: 7.8 g/dL (ref 6.0–8.3)

## 2013-03-25 LAB — CBC WITH DIFFERENTIAL/PLATELET
Basophils Relative: 0.6 % (ref 0.0–3.0)
Eosinophils Absolute: 0.2 10*3/uL (ref 0.0–0.7)
Lymphocytes Relative: 15.7 % (ref 12.0–46.0)
MCHC: 34.6 g/dL (ref 30.0–36.0)
Neutrophils Relative %: 74.8 % (ref 43.0–77.0)
Platelets: 274 10*3/uL (ref 150.0–400.0)
RBC: 4.6 Mil/uL (ref 4.22–5.81)
WBC: 7.3 10*3/uL (ref 4.5–10.5)

## 2013-03-25 MED ORDER — ZOLPIDEM TARTRATE 10 MG PO TABS: 5.0000 mg | ORAL_TABLET | Freq: Every evening | ORAL | Status: DC | PRN

## 2013-03-25 MED ORDER — FUROSEMIDE 20 MG PO TABS: 20.0000 mg | ORAL_TABLET | Freq: Every day | ORAL | Status: DC

## 2013-03-25 MED ORDER — AMLODIPINE BESYLATE 10 MG PO TABS: 10.0000 mg | ORAL_TABLET | Freq: Every day | ORAL | Status: DC

## 2013-03-25 MED ORDER — DULOXETINE HCL 30 MG PO CPEP: 30.0000 mg | ORAL_CAPSULE | Freq: Every day | ORAL | Status: DC

## 2013-03-25 MED ORDER — LOSARTAN POTASSIUM 100 MG PO TABS: 100.0000 mg | ORAL_TABLET | Freq: Every day | ORAL | Status: DC

## 2013-03-25 NOTE — Assessment & Plan Note (Signed)
Persistent symptoms of fatigue. No focal symptoms such as chest pain, dyspnea, change in bowel habits or appetite. Suspect symptoms may be related to ongoing issues with prostatitis. Will also check CMP, CBC, TSH, B12 with labs today. Follow up 4 weeks. If no improvement, will consider sleep study.

## 2013-03-25 NOTE — Addendum Note (Signed)
Addended by: Baldomero Lamy on: 03/25/2013 01:19 PM   Modules accepted: Orders

## 2013-03-25 NOTE — Progress Notes (Signed)
Subjective:    Patient ID: Larry Roman, male    DOB: 11-29-1946, 67 y.o.   MRN: [id number]  HPI 67YO male with history of hypertension and diet-controlled diabetes presents for followup. At his last visit he was noted to have hematuria and a urine cytology was abnormal. He was referred to urology. Repeat urinalysis was negative for blood. Repeat cytology was not performed. He was treated for suspected prostatitis with Bactrim x2 weeks and then Cipro times one week. Despite this, he continues to have increased urinary frequency and some dysuria. He denies fever, chills, flank pain.   He also notes ongoing fatigue. He attributes this in part to waking up several times during the night to urinate. He denies focal symptoms such as chest pain, shortness of breath, change in appetite.  In regards to BP, he notes typically at home BP 130s/80s. He is compliant with medications. He denies chest pain, headache, palpitations.  Outpatient Encounter Prescriptions as of 03/25/2013  Medication Sig Dispense Refill  . amLODipine (NORVASC) 10 MG tablet Take 1 tablet (10 mg total) by mouth daily.  90 tablet  3  . aspirin EC 81 MG tablet Take 81 mg by mouth daily.        Marland Kitchen azelastine (ASTELIN) 137 MCG/SPRAY nasal spray Place 1 spray into the nose 2 (two) times daily. Use in each nostril as directed  90 mL  3  . cyclobenzaprine (FLEXERIL) 10 MG tablet Take 1 tablet (10 mg total) by mouth 3 (three) times daily as needed.  90 tablet  3  . DULoxetine (CYMBALTA) 30 MG capsule Take 1 capsule (30 mg total) by mouth daily.  90 capsule  3  . furosemide (LASIX) 20 MG tablet Take 1 tablet (20 mg total) by mouth daily.  90 tablet  3  . glucose blood test strip One Touch Test strips Dx: 250.00 Use to check blood sugar 2-3 times daily  100 each  11  . HYDROcodone-acetaminophen (LORCET) 10-650 MG per tablet Take 1 tablet by mouth every 6 (six) hours as needed.  60 tablet  3  . losartan (COZAAR) 100 MG tablet Take 1 tablet (100  mg total) by mouth daily.  90 tablet  3  . vardenafil (LEVITRA) 5 MG tablet Take 1 tablet (5 mg total) by mouth as needed for erectile dysfunction.  10 tablet  0  . zolpidem (AMBIEN) 10 MG tablet Take 0.5-1 tablets (5-10 mg total) by mouth at bedtime as needed.  90 tablet  0   No facility-administered encounter medications on file as of 03/25/2013.   BP 156/96  Pulse 84  Temp(Src) 98.4 F (36.9 C) (Oral)  Wt 233 lb (105.688 kg)  BMI 31.59 kg/m2  SpO2 97%  Review of Systems  Constitutional: Positive for diaphoresis. Negative for fever, chills, activity change, appetite change, fatigue and unexpected weight change.  Eyes: Negative for visual disturbance.  Respiratory: Negative for cough and shortness of breath.   Cardiovascular: Negative for chest pain, palpitations and leg swelling.  Gastrointestinal: Negative for abdominal pain and abdominal distention.  Genitourinary: Positive for dysuria, urgency and frequency. Negative for hematuria, flank pain, discharge, penile swelling, difficulty urinating, genital sores and penile pain.  Musculoskeletal: Negative for arthralgias and gait problem.  Skin: Negative for color change and rash.  Hematological: Negative for adenopathy.  Psychiatric/Behavioral: Negative for sleep disturbance and dysphoric mood. The patient is not nervous/anxious.        Objective:   Physical Exam  Constitutional: He is oriented to  person, place, and time. He appears well-developed and well-nourished. No distress.  HENT:  Head: Normocephalic and atraumatic.  Right Ear: External ear normal.  Left Ear: External ear normal.  Nose: Nose normal.  Mouth/Throat: Oropharynx is clear and moist. No oropharyngeal exudate.  Eyes: Conjunctivae and EOM are normal. Pupils are equal, round, and reactive to light. Right eye exhibits no discharge. Left eye exhibits no discharge. No scleral icterus.  Neck: Normal range of motion. Neck supple. No tracheal deviation present. No  thyromegaly present.  Cardiovascular: Normal rate, regular rhythm and normal heart sounds.  Exam reveals no gallop and no friction rub.   No murmur heard. Pulmonary/Chest: Effort normal and breath sounds normal. No accessory muscle usage. Not tachypneic. No respiratory distress. He has no decreased breath sounds. He has no wheezes. He has no rhonchi. He has no rales. He exhibits no tenderness.  Musculoskeletal: Normal range of motion. He exhibits no edema.  Lymphadenopathy:    He has no cervical adenopathy.  Neurological: He is alert and oriented to person, place, and time. No cranial nerve deficit. Coordination normal.  Skin: Skin is warm and dry. No rash noted. He is not diaphoretic. No erythema. No pallor.  Psychiatric: He has a normal mood and affect. His behavior is normal. Judgment and thought content normal.          Assessment & Plan:

## 2013-03-25 NOTE — Assessment & Plan Note (Signed)
BP Readings from Last 3 Encounters:  03/25/13 156/96  12/14/12 170/92  10/15/12 112/70   Blood pressure initially elevated on arrival at clinic however then improved on recheck. We'll continue to monitor. Patient will call consistently greater than 150/90.

## 2013-03-25 NOTE — Assessment & Plan Note (Signed)
Will check A1c with labs today. 

## 2013-03-25 NOTE — Assessment & Plan Note (Signed)
Persistent dysuria and increased frequency. Urine culture x 2 negative. Will repeat urine cytology, which was abnormal in the past. If persistently abnormal, will set up follow up with urology.

## 2013-03-26 ENCOUNTER — Encounter: Payer: Self-pay | Admitting: Internal Medicine

## 2013-03-26 ENCOUNTER — Ambulatory Visit: Payer: Medicare Other

## 2013-03-26 DIAGNOSIS — E1059 Type 1 diabetes mellitus with other circulatory complications: Secondary | ICD-10-CM

## 2013-03-26 LAB — URINE CULTURE
Colony Count: NO GROWTH
Organism ID, Bacteria: NO GROWTH

## 2013-03-26 LAB — HEMOGLOBIN A1C: Hgb A1c MFr Bld: 6.7 % — ABNORMAL HIGH (ref 4.6–6.5)

## 2013-04-25 ENCOUNTER — Ambulatory Visit (INDEPENDENT_AMBULATORY_CARE_PROVIDER_SITE_OTHER): Payer: Medicare Other | Admitting: Internal Medicine

## 2013-04-25 ENCOUNTER — Encounter: Payer: Self-pay | Admitting: Internal Medicine

## 2013-04-25 VITALS — BP 136/74 | HR 70 | Temp 98.6°F | Wt 232.0 lb

## 2013-04-25 DIAGNOSIS — N419 Inflammatory disease of prostate, unspecified: Secondary | ICD-10-CM | POA: Diagnosis not present

## 2013-04-25 DIAGNOSIS — M549 Dorsalgia, unspecified: Secondary | ICD-10-CM | POA: Diagnosis not present

## 2013-04-25 LAB — POCT URINALYSIS DIPSTICK
Bilirubin, UA: NEGATIVE
Glucose, UA: NEGATIVE
Ketones, UA: NEGATIVE
Leukocytes, UA: NEGATIVE
Nitrite, UA: NEGATIVE

## 2013-04-25 MED ORDER — HYDROCODONE-ACETAMINOPHEN 10-650 MG PO TABS: 1.0000 | ORAL_TABLET | Freq: Four times a day (QID) | ORAL | Status: DC | PRN

## 2013-04-25 NOTE — Assessment & Plan Note (Signed)
Chronic low back pain well controlled with prn Hydrocodone. Will continue.

## 2013-04-25 NOTE — Assessment & Plan Note (Signed)
Recent h/o prostatitis. Symptoms not completely resolved. Having some dysuria. Will repeat urinalysis and will check PSA with labs today. Will plan to set up new urology evaluation if labs show signs of persistent infection.

## 2013-04-25 NOTE — Progress Notes (Signed)
Subjective:    Patient ID: Larry Roman, male    DOB: [birth year], 67 y.o.   MRN: [id number]  HPI 67 year old male with history of diabetes, hypertension, chronic low back pain, and recent episode of prostatitis presents for followup. He reports persistent symptoms of burning pain in his genital area. He denies any increased urinary frequency, hematuria, fever, chills, flank pain. He denies any change in urine flow. He completed one month course of antibiotics. He was seen by urology. Aside from this, he reports he is generally feeling well.  Chronic low back pain has generally been well-controlled with intermittent use of hydrocodone. Blood sugars have been well-controlled and he is compliant with healthy, low carbohydrate diet. Last A1c in April 2014 was 6.7%.  Outpatient Encounter Prescriptions as of 04/25/2013  Medication Sig Dispense Refill  . amLODipine (NORVASC) 10 MG tablet Take 1 tablet (10 mg total) by mouth daily.  90 tablet  3  . aspirin EC 81 MG tablet Take 81 mg by mouth daily.        Marland Kitchen azelastine (ASTELIN) 137 MCG/SPRAY nasal spray Place 1 spray into the nose 2 (two) times daily. Use in each nostril as directed  90 mL  3  . cyclobenzaprine (FLEXERIL) 10 MG tablet Take 1 tablet (10 mg total) by mouth 3 (three) times daily as needed.  90 tablet  3  . DULoxetine (CYMBALTA) 30 MG capsule Take 1 capsule (30 mg total) by mouth daily.  90 capsule  3  . furosemide (LASIX) 20 MG tablet Take 1 tablet (20 mg total) by mouth daily.  90 tablet  3  . glucose blood test strip One Touch Test strips Dx: 250.00 Use to check blood sugar 2-3 times daily  100 each  11  . HYDROcodone-acetaminophen (LORCET) 10-650 MG per tablet Take 1 tablet by mouth every 6 (six) hours as needed.  60 tablet  3  . losartan (COZAAR) 100 MG tablet Take 1 tablet (100 mg total) by mouth daily.  90 tablet  3  . vardenafil (LEVITRA) 5 MG tablet Take 1 tablet (5 mg total) by mouth as needed for erectile dysfunction.  10 tablet   0  . zolpidem (AMBIEN) 10 MG tablet Take 0.5-1 tablets (5-10 mg total) by mouth at bedtime as needed.  90 tablet  0  . [DISCONTINUED] HYDROcodone-acetaminophen (LORCET) 10-650 MG per tablet Take 1 tablet by mouth every 6 (six) hours as needed.  60 tablet  3   No facility-administered encounter medications on file as of 04/25/2013.   BP 136/74  Pulse 70  Temp(Src) 98.6 F (37 C) (Oral)  Wt 232 lb (105.235 kg)  BMI 31.46 kg/m2  SpO2 95%  Review of Systems  Constitutional: Negative for fever, chills, activity change, appetite change, fatigue and unexpected weight change.  Eyes: Negative for visual disturbance.  Respiratory: Negative for cough and shortness of breath.   Cardiovascular: Negative for chest pain, palpitations and leg swelling.  Gastrointestinal: Negative for abdominal pain and abdominal distention.  Genitourinary: Positive for dysuria. Negative for urgency, frequency, hematuria, decreased urine volume, penile swelling, scrotal swelling, difficulty urinating and testicular pain.  Musculoskeletal: Negative for arthralgias and gait problem.  Skin: Negative for color change and rash.  Hematological: Negative for adenopathy.  Psychiatric/Behavioral: Negative for sleep disturbance and dysphoric mood. The patient is not nervous/anxious.        Objective:   Physical Exam  Constitutional: He is oriented to person, place, and time. He appears well-developed and well-nourished. No distress.  HENT:  Head: Normocephalic and atraumatic.  Right Ear: External ear normal.  Left Ear: External ear normal.  Nose: Nose normal.  Mouth/Throat: Oropharynx is clear and moist. No oropharyngeal exudate.  Eyes: Conjunctivae and EOM are normal. Pupils are equal, round, and reactive to light. Right eye exhibits no discharge. Left eye exhibits no discharge. No scleral icterus.  Neck: Normal range of motion. Neck supple. No tracheal deviation present. No thyromegaly present.  Cardiovascular: Normal  rate, regular rhythm and normal heart sounds.  Exam reveals no gallop and no friction rub.   No murmur heard. Pulmonary/Chest: Effort normal and breath sounds normal. No respiratory distress. He has no wheezes. He has no rales. He exhibits no tenderness.  Musculoskeletal: Normal range of motion. He exhibits no edema.  Lymphadenopathy:    He has no cervical adenopathy.  Neurological: He is alert and oriented to person, place, and time. No cranial nerve deficit. Coordination normal.  Skin: Skin is warm and dry. No rash noted. He is not diaphoretic. No erythema. No pallor.  Psychiatric: He has a normal mood and affect. His behavior is normal. Judgment and thought content normal.          Assessment & Plan:

## 2013-04-26 LAB — PSA, TOTAL AND FREE
PSA, Free Pct: 18 % — ABNORMAL LOW (ref >25)
PSA, Free: 0.1 ng/mL
PSA: 0.56 ng/mL (ref <=4.00)

## 2013-04-27 LAB — CULTURE, URINE COMPREHENSIVE
Colony Count: NO GROWTH
Organism ID, Bacteria: NO GROWTH

## 2013-05-15 ENCOUNTER — Telehealth: Payer: Self-pay | Admitting: *Deleted

## 2013-05-15 NOTE — Telephone Encounter (Signed)
Need to switch to Lorcet from the 500 to  [place] 10-325. They no longer make the 500. Patient is in [place] on [address]

## 2013-05-15 NOTE — Telephone Encounter (Signed)
Called and spoke with pharmacist.

## 2013-05-15 NOTE — Telephone Encounter (Signed)
That is fine 

## 2013-06-25 ENCOUNTER — Encounter: Payer: Self-pay | Admitting: Internal Medicine

## 2013-06-25 ENCOUNTER — Ambulatory Visit (INDEPENDENT_AMBULATORY_CARE_PROVIDER_SITE_OTHER): Payer: Medicare Other | Admitting: Internal Medicine

## 2013-06-25 VITALS — BP 138/90 | HR 63 | Temp 98.2°F | Wt 232.0 lb

## 2013-06-25 DIAGNOSIS — G4700 Insomnia, unspecified: Secondary | ICD-10-CM | POA: Diagnosis not present

## 2013-06-25 DIAGNOSIS — I10 Essential (primary) hypertension: Secondary | ICD-10-CM | POA: Diagnosis not present

## 2013-06-25 DIAGNOSIS — E119 Type 2 diabetes mellitus without complications: Secondary | ICD-10-CM | POA: Diagnosis not present

## 2013-06-25 LAB — COMPREHENSIVE METABOLIC PANEL WITH GFR
ALT: 30 U/L (ref 0–53)
AST: 25 U/L (ref 0–37)
Albumin: 4.4 g/dL (ref 3.5–5.2)
Alkaline Phosphatase: 58 U/L (ref 39–117)
BUN: 12 mg/dL (ref 6–23)
CO2: 30 meq/L (ref 19–32)
Calcium: 9.5 mg/dL (ref 8.4–10.5)
Chloride: 101 meq/L (ref 96–112)
Creatinine, Ser: 0.8 mg/dL (ref 0.4–1.5)
GFR: 101.34 mL/min
Glucose, Bld: 100 mg/dL — ABNORMAL HIGH (ref 70–99)
Potassium: 3.9 meq/L (ref 3.5–5.1)
Sodium: 138 meq/L (ref 135–145)
Total Bilirubin: 1 mg/dL (ref 0.3–1.2)
Total Protein: 7.3 g/dL (ref 6.0–8.3)

## 2013-06-25 LAB — HEMOGLOBIN A1C: Hgb A1c MFr Bld: 6.9 % — ABNORMAL HIGH (ref 4.6–6.5)

## 2013-06-25 MED ORDER — ZOLPIDEM TARTRATE 10 MG PO TABS: 5.0000 mg | ORAL_TABLET | Freq: Every evening | ORAL | Status: DC | PRN

## 2013-06-25 NOTE — Progress Notes (Signed)
Subjective:    Patient ID: Larry Roman, male    DOB: August 09, 1946, 67 y.o.   MRN: [id number]  HPI 68 year old male with diet-controlled diabetes, hypertension, chronic insomnia presents for followup. At his last visit, he was being treated for acute prostatitis. He reports that symptoms have completely resolved. He is generally feeling well. He periodically test his blood sugars and notes that his blood sugars have been increasing gradually. He attributes this to some dietary indiscretion. He has never taken medication to control his blood sugars. He denies polyuria. He denies any blood sugars greater than 200. No other concerns today.  Outpatient Encounter Prescriptions as of 06/25/2013  Medication Sig Dispense Refill  . amLODipine (NORVASC) 10 MG tablet Take 1 tablet (10 mg total) by mouth daily.  90 tablet  3  . aspirin EC 81 MG tablet Take 81 mg by mouth daily.        Marland Kitchen azelastine (ASTELIN) 137 MCG/SPRAY nasal spray Place 1 spray into the nose 2 (two) times daily. Use in each nostril as directed  90 mL  3  . cyclobenzaprine (FLEXERIL) 10 MG tablet Take 1 tablet (10 mg total) by mouth 3 (three) times daily as needed.  90 tablet  3  . DULoxetine (CYMBALTA) 30 MG capsule Take 1 capsule (30 mg total) by mouth daily.  90 capsule  3  . furosemide (LASIX) 20 MG tablet Take 1 tablet (20 mg total) by mouth daily.  90 tablet  3  . glucose blood test strip One Touch Test strips Dx: 250.00 Use to check blood sugar 2-3 times daily  100 each  11  . HYDROcodone-acetaminophen (LORCET) 10-650 MG per tablet Take 1 tablet by mouth every 6 (six) hours as needed.  60 tablet  3  . losartan (COZAAR) 100 MG tablet Take 1 tablet (100 mg total) by mouth daily.  90 tablet  3  . vardenafil (LEVITRA) 5 MG tablet Take 1 tablet (5 mg total) by mouth as needed for erectile dysfunction.  10 tablet  0  . zolpidem (AMBIEN) 10 MG tablet Take 0.5-1 tablets (5-10 mg total) by mouth at bedtime as needed.  90 tablet  2  .  [DISCONTINUED] zolpidem (AMBIEN) 10 MG tablet Take 0.5-1 tablets (5-10 mg total) by mouth at bedtime as needed.  90 tablet  0   No facility-administered encounter medications on file as of 06/25/2013.   BP 138/90  Pulse 63  Temp(Src) 98.2 F (36.8 C) (Oral)  Wt 232 lb (105.235 kg)  BMI 31.46 kg/m2  SpO2 97%  Review of Systems  Constitutional: Negative for fever, chills, activity change, appetite change, fatigue and unexpected weight change.  Eyes: Negative for visual disturbance.  Respiratory: Negative for cough and shortness of breath.   Cardiovascular: Negative for chest pain, palpitations and leg swelling.  Gastrointestinal: Negative for abdominal pain and abdominal distention.  Genitourinary: Negative for dysuria, urgency and difficulty urinating.  Musculoskeletal: Negative for arthralgias and gait problem.  Skin: Negative for color change and rash.  Hematological: Negative for adenopathy.  Psychiatric/Behavioral: Negative for sleep disturbance and dysphoric mood. The patient is not nervous/anxious.        Objective:   Physical Exam  Constitutional: He is oriented to person, place, and time. He appears well-developed and well-nourished. No distress.  HENT:  Head: Normocephalic and atraumatic.  Right Ear: External ear normal.  Left Ear: External ear normal.  Nose: Nose normal.  Mouth/Throat: Oropharynx is clear and moist. No oropharyngeal exudate.  Eyes: Conjunctivae  and EOM are normal. Pupils are equal, round, and reactive to light. Right eye exhibits no discharge. Left eye exhibits no discharge. No scleral icterus.  Neck: Normal range of motion. Neck supple. No tracheal deviation present. No thyromegaly present.  Cardiovascular: Normal rate, regular rhythm and normal heart sounds.  Exam reveals no gallop and no friction rub.   No murmur heard. Pulmonary/Chest: Effort normal and breath sounds normal. No respiratory distress. He has no wheezes. He has no rales. He exhibits no  tenderness.  Musculoskeletal: Normal range of motion. He exhibits no edema.  Lymphadenopathy:    He has no cervical adenopathy.  Neurological: He is alert and oriented to person, place, and time. No cranial nerve deficit. Coordination normal.  Skin: Skin is warm and dry. No rash noted. He is not diaphoretic. No erythema. No pallor.  Psychiatric: He has a normal mood and affect. His behavior is normal. Judgment and thought content normal.          Assessment & Plan:

## 2013-06-25 NOTE — Assessment & Plan Note (Signed)
Lab Results  Component Value Date   HGBA1C 6.7* 03/26/2013   Historically diet-controlled. Will recheck A1c with labs today. Discussed adding metformin if A1c consistently >6.5%.

## 2013-06-25 NOTE — Assessment & Plan Note (Signed)
BP Readings from Last 3 Encounters:  06/25/13 138/90  04/25/13 136/74  03/25/13 156/96   BP well controlled generally. Will continue current medications. Will check renal function with labs today.

## 2013-06-27 ENCOUNTER — Encounter: Payer: Self-pay | Admitting: Internal Medicine

## 2013-06-27 MED ORDER — METFORMIN HCL 500 MG PO TABS: 500.0000 mg | ORAL_TABLET | Freq: Two times a day (BID) | ORAL | Status: DC

## 2013-08-29 DIAGNOSIS — E119 Type 2 diabetes mellitus without complications: Secondary | ICD-10-CM | POA: Diagnosis not present

## 2013-10-01 ENCOUNTER — Encounter: Payer: Self-pay | Admitting: *Deleted

## 2013-10-02 ENCOUNTER — Encounter: Payer: Self-pay | Admitting: Internal Medicine

## 2013-10-02 ENCOUNTER — Ambulatory Visit: Payer: Medicare Other

## 2013-10-02 ENCOUNTER — Ambulatory Visit (INDEPENDENT_AMBULATORY_CARE_PROVIDER_SITE_OTHER): Payer: Medicare Other | Admitting: Internal Medicine

## 2013-10-02 VITALS — BP 136/80 | HR 76 | Temp 98.5°F | Wt 229.0 lb

## 2013-10-02 DIAGNOSIS — E119 Type 2 diabetes mellitus without complications: Secondary | ICD-10-CM

## 2013-10-02 DIAGNOSIS — J321 Chronic frontal sinusitis: Secondary | ICD-10-CM

## 2013-10-02 DIAGNOSIS — G4700 Insomnia, unspecified: Secondary | ICD-10-CM

## 2013-10-02 DIAGNOSIS — Z23 Encounter for immunization: Secondary | ICD-10-CM | POA: Diagnosis not present

## 2013-10-02 DIAGNOSIS — I10 Essential (primary) hypertension: Secondary | ICD-10-CM

## 2013-10-02 LAB — HM DIABETES FOOT EXAM: HM Diabetic Foot Exam: NORMAL

## 2013-10-02 LAB — LIPID PANEL
Cholesterol: 161 mg/dL (ref 0–200)
HDL: 41.4 mg/dL (ref >39.00)
Total CHOL/HDL Ratio: 4
Triglycerides: 226 mg/dL — ABNORMAL HIGH (ref 0.0–149.0)

## 2013-10-02 LAB — COMPREHENSIVE METABOLIC PANEL
ALT: 32 U/L (ref 0–53)
AST: 29 U/L (ref 0–37)
Albumin: 4.6 g/dL (ref 3.5–5.2)
Alkaline Phosphatase: 56 U/L (ref 39–117)
BUN: 11 mg/dL (ref 6–23)
CO2: 29 mEq/L (ref 19–32)
Calcium: 9.5 mg/dL (ref 8.4–10.5)
Glucose, Bld: 132 mg/dL — ABNORMAL HIGH (ref 70–99)
Potassium: 3.8 mEq/L (ref 3.5–5.1)
Sodium: 137 mEq/L (ref 135–145)
Total Protein: 7.8 g/dL (ref 6.0–8.3)

## 2013-10-02 LAB — HEMOGLOBIN A1C: Hgb A1c MFr Bld: 6.3 % (ref 4.6–6.5)

## 2013-10-02 LAB — LDL CHOLESTEROL, DIRECT: Direct LDL: 91.9 mg/dL

## 2013-10-02 LAB — HM DIABETES EYE EXAM

## 2013-10-02 MED ORDER — AZELASTINE HCL 0.1 % NA SOLN: 1.0000 | Freq: Two times a day (BID) | NASAL | Status: DC

## 2013-10-02 MED ORDER — GLUCOSE BLOOD VI STRP: ORAL_STRIP | Status: DC

## 2013-10-02 MED ORDER — AMOXICILLIN-POT CLAVULANATE 875-125 MG PO TABS: 1.0000 | ORAL_TABLET | Freq: Two times a day (BID) | ORAL | Status: DC

## 2013-10-02 MED ORDER — AMLODIPINE BESYLATE 10 MG PO TABS: 10.0000 mg | ORAL_TABLET | Freq: Every day | ORAL | Status: DC

## 2013-10-02 MED ORDER — ZOLPIDEM TARTRATE 10 MG PO TABS: 10.0000 mg | ORAL_TABLET | Freq: Every evening | ORAL | Status: DC | PRN

## 2013-10-02 NOTE — Assessment & Plan Note (Addendum)
Lab Results  Component Value Date   HGBA1C 6.9* 06/25/2013   Will check A1c with labs today. Continue metformin. Foot exam normal today. Eye exam up-to-date. Historically, he has not been on a statin medication because of low cholesterol readings. Will recheck lipid profile today.

## 2013-10-02 NOTE — Assessment & Plan Note (Signed)
BP Readings from Last 3 Encounters:  10/02/13 136/80  06/25/13 138/90  04/25/13 136/74   Blood pressure well-controlled on losartan. Will check renal function with labs today.

## 2013-10-02 NOTE — Progress Notes (Signed)
Subjective:    Patient ID: Larry Roman, male    DOB: 07/03/1946, 67 y.o.   MRN: [id number]  HPI 67 year old male with history of diabetes, hypertension presents for followup. He reports blood sugars have been well-controlled with fasting blood sugars near 100. He is compliant with medication. No low blood sugars less than 70 or blood sugars greater than 200 noted.  He is concerned today about one week history of nasal congestion and frontal sinus pain. Symptoms began suddenly last week after he spent a prolonged period of time outdoors working with proximal. He questions whether allergic symptoms may have triggered sinus infection. He has had sinus infections in the past. He denies any fever or chills. He denies any cough. He has been taking Alka-Seltzer with some improvement.  Outpatient Encounter Prescriptions as of 10/02/2013  Medication Sig Dispense Refill  . amLODipine (NORVASC) 10 MG tablet Take 1 tablet (10 mg total) by mouth daily.  90 tablet  3  . aspirin EC 81 MG tablet Take 81 mg by mouth daily.        Marland Kitchen azelastine (ASTELIN) 137 MCG/SPRAY nasal spray Place 1 spray into the nose 2 (two) times daily. Use in each nostril as directed  90 mL  3  . Cholecalciferol (VITAMIN D3) 2000 UNITS TABS Take by mouth.      . Cyanocobalamin (B-12) 5000 MCG SUBL Place under the tongue.      . cyclobenzaprine (FLEXERIL) 10 MG tablet Take 1 tablet (10 mg total) by mouth 3 (three) times daily as needed.  90 tablet  3  . DULoxetine (CYMBALTA) 30 MG capsule Take 1 capsule (30 mg total) by mouth daily.  90 capsule  3  . furosemide (LASIX) 20 MG tablet Take 1 tablet (20 mg total) by mouth daily.  90 tablet  3  . glucose blood test strip One Touch Test strips Use to check blood sugar 2-3 times daily  100 each  11  . loratadine (CLARITIN) 10 MG tablet Take 10 mg by mouth daily.      Marland Kitchen losartan (COZAAR) 100 MG tablet Take 1 tablet (100 mg total) by mouth daily.  90 tablet  3  . metFORMIN (GLUCOPHAGE) 500 MG  tablet Take 1 tablet (500 mg total) by mouth 2 (two) times daily with a meal.  180 tablet  3  . ranitidine (ZANTAC) 75 MG tablet Take 75 mg by mouth daily as needed for heartburn.      . zolpidem (AMBIEN) 10 MG tablet Take 1 tablet (10 mg total) by mouth at bedtime as needed.  90 tablet  2  . HYDROcodone-acetaminophen (LORCET) 10-650 MG per tablet Take 1 tablet by mouth every 6 (six) hours as needed.  60 tablet  3  . vardenafil (LEVITRA) 5 MG tablet Take 1 tablet (5 mg total) by mouth as needed for erectile dysfunction.  10 tablet  0   No facility-administered encounter medications on file as of 10/02/2013.   BP 136/80  Pulse 76  Temp(Src) 98.5 F (36.9 C) (Oral)  Wt 229 lb (103.874 kg)  BMI 31.05 kg/m2  SpO2 97%  Review of Systems  Constitutional: Positive for fatigue. Negative for fever, chills, activity change, appetite change and unexpected weight change.  HENT: Positive for rhinorrhea and sinus pressure.   Eyes: Negative for visual disturbance.  Respiratory: Negative for cough and shortness of breath.   Cardiovascular: Negative for chest pain, palpitations and leg swelling.  Gastrointestinal: Negative for abdominal pain and abdominal distention.  Genitourinary: Negative for dysuria, urgency and difficulty urinating.  Musculoskeletal: Negative for arthralgias and gait problem.  Skin: Negative for color change and rash.  Hematological: Negative for adenopathy.  Psychiatric/Behavioral: Negative for sleep disturbance and dysphoric mood. The patient is not nervous/anxious.        Objective:   Physical Exam  Constitutional: He is oriented to person, place, and time. He appears well-developed and well-nourished. No distress.  HENT:  Head: Normocephalic and atraumatic.  Right Ear: External ear normal. Tympanic membrane is erythematous. A middle ear effusion is present.  Left Ear: External ear normal. A middle ear effusion is present.  Nose: Mucosal edema present. Right sinus  exhibits frontal sinus tenderness. Left sinus exhibits frontal sinus tenderness.  Mouth/Throat: Oropharynx is clear and moist. No oropharyngeal exudate.  Eyes: Conjunctivae and EOM are normal. Pupils are equal, round, and reactive to light. Right eye exhibits no discharge. Left eye exhibits no discharge. No scleral icterus.  Neck: Normal range of motion. Neck supple. No tracheal deviation present. No thyromegaly present.  Cardiovascular: Normal rate, regular rhythm and normal heart sounds.  Exam reveals no gallop and no friction rub.   No murmur heard. Pulmonary/Chest: Effort normal and breath sounds normal. No respiratory distress. He has no wheezes. He has no rales. He exhibits no tenderness.  Musculoskeletal: Normal range of motion. He exhibits no edema.  Lymphadenopathy:    He has no cervical adenopathy.  Neurological: He is alert and oriented to person, place, and time. No cranial nerve deficit. Coordination normal.  Skin: Skin is warm and dry. No rash noted. He is not diaphoretic. No erythema. No pallor.  Psychiatric: He has a normal mood and affect. His behavior is normal. Judgment and thought content normal.          Assessment & Plan:

## 2013-10-02 NOTE — Assessment & Plan Note (Signed)
Symptoms and exam consistent with frontal sinusitis. We discussed that this may resolve without intervention. He will continue to monitor symptoms and use Alka-Seltzer as needed over the next 24 hours. If no improvement, will start Augmentin twice daily for 10 days. He will e-mail or call with update. Followup if no improvement.

## 2013-10-07 ENCOUNTER — Telehealth: Payer: Self-pay | Admitting: *Deleted

## 2013-10-07 NOTE — Telephone Encounter (Signed)
Called patients pharmacy and they said the he didn't need a prior authorization, that they need a dx code for the strip and the rx sent over with the dx code

## 2013-10-07 NOTE — Telephone Encounter (Signed)
Patient left a voicemail stating he needs PA for his test strips, [facility] faxed something over last week in reference to this. Could you please check into this, it is [facility] on [address] for his [employer] Strips.

## 2013-10-08 NOTE — Telephone Encounter (Signed)
Thanks and patient was informed prescription resent to pharmacy

## 2013-10-10 ENCOUNTER — Encounter: Payer: Self-pay | Admitting: Internal Medicine

## 2013-10-10 MED ORDER — GLUCOSE BLOOD VI STRP: ORAL_STRIP | Status: DC

## 2013-11-25 ENCOUNTER — Other Ambulatory Visit: Payer: Self-pay | Admitting: Internal Medicine

## 2013-12-16 ENCOUNTER — Telehealth: Payer: Self-pay | Admitting: Internal Medicine

## 2013-12-16 NOTE — Telephone Encounter (Signed)
Spoke with patient and he would like a refill on this abx because he has a head cold that he thinks it is going into bronchitis or sinus infection.

## 2013-12-16 NOTE — Telephone Encounter (Signed)
amoxicillin-clavulanate (AUGMENTIN) 875-125 MG per tablet

## 2013-12-16 NOTE — Telephone Encounter (Signed)
Needs to be seen. May overbook 6:30pm tomorrow

## 2013-12-17 NOTE — Telephone Encounter (Signed)
Spoke with patient and offered an appointment. He declined, per patient he will wait awhile to see if it gets better on it's own if it doesn't then he will call back to schedule an appointment.

## 2014-01-14 ENCOUNTER — Encounter: Payer: Self-pay | Admitting: Internal Medicine

## 2014-01-14 ENCOUNTER — Ambulatory Visit (INDEPENDENT_AMBULATORY_CARE_PROVIDER_SITE_OTHER): Payer: Medicare Other | Admitting: Internal Medicine

## 2014-01-14 VITALS — BP 140/88 | HR 68 | Temp 97.8°F | Wt 223.0 lb

## 2014-01-14 DIAGNOSIS — Z0000 Encounter for general adult medical examination without abnormal findings: Secondary | ICD-10-CM

## 2014-01-14 DIAGNOSIS — E119 Type 2 diabetes mellitus without complications: Secondary | ICD-10-CM

## 2014-01-14 DIAGNOSIS — G4700 Insomnia, unspecified: Secondary | ICD-10-CM | POA: Diagnosis not present

## 2014-01-14 DIAGNOSIS — Z23 Encounter for immunization: Secondary | ICD-10-CM | POA: Diagnosis not present

## 2014-01-14 DIAGNOSIS — M549 Dorsalgia, unspecified: Secondary | ICD-10-CM

## 2014-01-14 DIAGNOSIS — Z125 Encounter for screening for malignant neoplasm of prostate: Secondary | ICD-10-CM

## 2014-01-14 DIAGNOSIS — J321 Chronic frontal sinusitis: Secondary | ICD-10-CM

## 2014-01-14 DIAGNOSIS — I10 Essential (primary) hypertension: Secondary | ICD-10-CM

## 2014-01-14 LAB — MICROALBUMIN / CREATININE URINE RATIO
Creatinine,U: 150.8 mg/dL
Microalb Creat Ratio: 5.3 mg/g (ref 0.0–30.0)
Microalb, Ur: 8 mg/dL — ABNORMAL HIGH (ref 0.0–1.9)

## 2014-01-14 LAB — COMPREHENSIVE METABOLIC PANEL
ALBUMIN: 4.5 g/dL (ref 3.5–5.2)
ALT: 29 U/L (ref 0–53)
AST: 24 U/L (ref 0–37)
Alkaline Phosphatase: 56 U/L (ref 39–117)
BUN: 16 mg/dL (ref 6–23)
CALCIUM: 9.6 mg/dL (ref 8.4–10.5)
CHLORIDE: 99 meq/L (ref 96–112)
CO2: 28 mEq/L (ref 19–32)
Creatinine, Ser: 0.8 mg/dL (ref 0.4–1.5)
GFR: 102.63 mL/min (ref >60.00)
Glucose, Bld: 87 mg/dL (ref 70–99)
Potassium: 3.9 mEq/L (ref 3.5–5.1)
SODIUM: 137 meq/L (ref 135–145)
Total Bilirubin: 1.1 mg/dL (ref 0.3–1.2)
Total Protein: 7.3 g/dL (ref 6.0–8.3)

## 2014-01-14 LAB — HEMOGLOBIN A1C: Hgb A1c MFr Bld: 6 % (ref 4.6–6.5)

## 2014-01-14 LAB — PSA, MEDICARE: PSA: 0.43 ng/ml (ref 0.10–4.00)

## 2014-01-14 MED ORDER — GLUCOSE BLOOD VI STRP: ORAL_STRIP | Status: DC

## 2014-01-14 MED ORDER — ZOLPIDEM TARTRATE 5 MG PO TABS: 5.0000 mg | ORAL_TABLET | Freq: Every evening | ORAL | Status: DC | PRN

## 2014-01-14 MED ORDER — AMOXICILLIN-POT CLAVULANATE 875-125 MG PO TABS: 1.0000 | ORAL_TABLET | Freq: Two times a day (BID) | ORAL | Status: DC

## 2014-01-14 MED ORDER — LOSARTAN POTASSIUM 100 MG PO TABS: 100.0000 mg | ORAL_TABLET | Freq: Every day | ORAL | Status: DC

## 2014-01-14 MED ORDER — HYDROCODONE-ACETAMINOPHEN 5-325 MG PO TABS: 1.0000 | ORAL_TABLET | Freq: Four times a day (QID) | ORAL | Status: DC | PRN

## 2014-01-14 MED ORDER — FUROSEMIDE 20 MG PO TABS: 20.0000 mg | ORAL_TABLET | Freq: Every day | ORAL | Status: DC

## 2014-01-14 MED ORDER — CYCLOBENZAPRINE HCL 10 MG PO TABS: 10.0000 mg | ORAL_TABLET | Freq: Three times a day (TID) | ORAL | Status: DC | PRN

## 2014-01-14 MED ORDER — DULOXETINE HCL 30 MG PO CPEP: 30.0000 mg | ORAL_CAPSULE | Freq: Every day | ORAL | Status: DC

## 2014-01-14 NOTE — Assessment & Plan Note (Signed)
Lab Results  Component Value Date   HGBA1C 6.3 10/02/2013   BG well controlled on metformin alone. Will recheck A1c today. Plan for 6 month follow up. Foot exam normal today.

## 2014-01-14 NOTE — Assessment & Plan Note (Signed)
Secondary to OA and DJD. Symptoms well controlled with prn Flexeril and Hydrocodone. Will continue.

## 2014-01-14 NOTE — Progress Notes (Signed)
Subjective:    Patient ID: Larry Roman, male    DOB: 11/09/1946, 68 y.o.   MRN: [id number]  HPI The patient is here for annual Medicare wellness examination and management of other chronic and acute problems.   The risk factors are reflected in the social history.  The roster of all physicians providing medical care to patient - is listed in the Snapshot section of the chart.  Activities of daily living:  The patient is 100% independent in all ADLs: dressing, toileting, feeding as well as independent mobility. Lives with wife. No pets in home.  Home safety : The patient has smoke detectors in the home. They wear seatbelts.  There are locked firearms at home. There is no violence in the home.   There is no risks for hepatitis, STDs or HIV. There is no history of blood transfusion. They have no travel history to infectious disease endemic areas of the world.  The patient has seen their dentist in the last six month. (Dr. Belva Agee) They have seen their eye doctor in the last year. [facility]) No trouble with hearing  They have deferred audiologic testing in the last year.   They do not  have excessive sun exposure. Discussed the need for sun protection: hats, long sleeves and use of sunscreen if there is significant sun exposure. No dermatologist.  Diet: the importance of a healthy diet is discussed. They do have a healthy diet.  The benefits of regular aerobic exercise were discussed. He walks 5 days per week 35-76min.   Depression screen: there are no signs or vegative symptoms of depression- irritability, change in appetite, anhedonia, sadness/tearfullness.  Cognitive assessment: the patient manages all their financial and personal affairs and is actively engaged. They could relate day,date,year and events.  Living Will in Place. HCPOA - wife, Larry Roman.  The following portions of the patient's history were reviewed and updated as appropriate: allergies, current  medications, past family history, past medical history,  past surgical history, past social history  and problem list.  Visual acuity was not assessed per patient preference since he has regular follow up with her ophthalmologist. Hearing and body mass index were assessed and reviewed.   During the course of the visit the patient was educated and counseled about appropriate screening and preventive services including : fall prevention , diabetes screening, nutrition counseling, colorectal cancer screening, and recommended immunizations.     Outpatient Encounter Prescriptions as of 01/14/2014  Medication Sig  . amLODipine (NORVASC) 10 MG tablet Take 1 tablet (10 mg total) by mouth daily.  Marland Kitchen aspirin EC 81 MG tablet Take 81 mg by mouth daily.    Marland Kitchen azelastine (ASTELIN) 137 MCG/SPRAY nasal spray Place 1 spray into the nose 2 (two) times daily. Use in each nostril as directed  . Cholecalciferol (VITAMIN D3) 2000 UNITS TABS Take by mouth.  . Cyanocobalamin (B-12) 5000 MCG SUBL Place under the tongue.  . cyclobenzaprine (FLEXERIL) 10 MG tablet Take 1 tablet (10 mg total) by mouth 3 (three) times daily as needed.  . DULoxetine (CYMBALTA) 30 MG capsule Take 1 capsule (30 mg total) by mouth daily.  . furosemide (LASIX) 20 MG tablet Take 1 tablet (20 mg total) by mouth daily.  Marland Kitchen glucose blood test strip One Touch Test strips Use to check blood sugar 2-3 times daily  . loratadine (CLARITIN) 10 MG tablet Take 10 mg by mouth daily.  Marland Kitchen losartan (COZAAR) 100 MG tablet Take 1 tablet (100 mg  total) by mouth daily.  . metFORMIN (GLUCOPHAGE) 500 MG tablet Take 1 tablet (500 mg total) by mouth 2 (two) times daily with a meal.  . ranitidine (ZANTAC) 75 MG tablet Take 75 mg by mouth daily as needed for heartburn.  . zolpidem (AMBIEN) 10 MG tablet Take 1 tablet (10 mg total) by mouth at bedtime as needed.  Marland Kitchen HYDROcodone-acetaminophen (LORCET) 10-650 MG per tablet Take 1 tablet by mouth every 6 (six) hours as needed.    . vardenafil (LEVITRA) 5 MG tablet Take 1 tablet (5 mg total) by mouth as needed for erectile dysfunction.   BP 140/88  Pulse 68  Temp(Src) 97.8 F (36.6 C) (Oral)  Wt 223 lb (101.152 kg)  SpO2 96%   Review of Systems  Constitutional: Negative for fever, chills, activity change, appetite change, fatigue and unexpected weight change.  Eyes: Negative for visual disturbance.  Respiratory: Negative for cough and shortness of breath.   Cardiovascular: Negative for chest pain, palpitations and leg swelling.  Gastrointestinal: Negative for abdominal pain and abdominal distention.  Genitourinary: Negative for dysuria, urgency and difficulty urinating.  Musculoskeletal: Positive for arthralgias and back pain (chronic). Negative for gait problem.  Skin: Negative for color change and rash.  Hematological: Negative for adenopathy.  Psychiatric/Behavioral: Negative for sleep disturbance and dysphoric mood. The patient is not nervous/anxious.        Objective:   Physical Exam  Constitutional: He is oriented to person, place, and time. He appears well-developed and well-nourished. No distress.  HENT:  Head: Normocephalic and atraumatic.  Right Ear: External ear normal.  Left Ear: External ear normal.  Nose: Nose normal.  Mouth/Throat: Oropharynx is clear and moist. No oropharyngeal exudate.  Eyes: Conjunctivae and EOM are normal. Pupils are equal, round, and reactive to light. Right eye exhibits no discharge. Left eye exhibits no discharge. No scleral icterus.  Neck: Normal range of motion. Neck supple. No tracheal deviation present. No thyromegaly present.  Cardiovascular: Normal rate, regular rhythm and normal heart sounds.  Exam reveals no gallop and no friction rub.   No murmur heard. Pulmonary/Chest: Effort normal and breath sounds normal. No accessory muscle usage. Not tachypneic. No respiratory distress. He has no decreased breath sounds. He has no wheezes. He has no rhonchi. He has no  rales. He exhibits no tenderness.  Abdominal: Soft. Bowel sounds are normal. He exhibits no distension and no mass. There is no tenderness. There is no rebound and no guarding.  Musculoskeletal: Normal range of motion. He exhibits no edema.  Lymphadenopathy:    He has no cervical adenopathy.  Neurological: He is alert and oriented to person, place, and time. No cranial nerve deficit. Coordination normal.  Skin: Skin is warm and dry. No rash noted. He is not diaphoretic. No erythema. No pallor.  Psychiatric: He has a normal mood and affect. His behavior is normal. Judgment and thought content normal.          Assessment & Plan:

## 2014-01-14 NOTE — Assessment & Plan Note (Signed)
Will try reducing dose of Ambien to 5mg  daily given FDA guidelines. Follow up prn.

## 2014-01-14 NOTE — Assessment & Plan Note (Signed)
General medical exam normal today. Health maintenance UTD except for Prevnar which was given today. Encouraged continued healthy diet and regular exercise. Labs today including CMP, PSA, urine microalbumin. Will hold on repeating lipids, as normal 09/2013.

## 2014-01-14 NOTE — Assessment & Plan Note (Signed)
BP Readings from Last 3 Encounters:  01/14/14 140/88  10/02/13 136/80  06/25/13 138/90   BP generally well controlled on Losartan and amlodipine. Will continue.

## 2014-01-15 ENCOUNTER — Telehealth: Payer: Self-pay | Admitting: Internal Medicine

## 2014-01-15 NOTE — Telephone Encounter (Signed)
Patient was sent a Mychart message 

## 2014-01-15 NOTE — Telephone Encounter (Signed)
Relevant patient education assigned to patient using Emmi. ° °

## 2014-01-15 NOTE — Telephone Encounter (Signed)
See below my chart message   ','<More Detail >>          Appointment Request (HM)  Larry Roman  MRN: [id number] DOB: 02-18-1946     Pt Work: [phone] Pt Home: [phone]   Sent: Tue January 14, 2014 5:02 PM   Entered: [phone]     To: [facility] Admin Pool                                 Message    Appointment Request From: Chancy Hurter      With Provider: Rica Mast, MD [-Primary Care Physician-]      Preferred Date Range: Any date 01/14/2014 or later      Preferred Times: Any      Reason: To address the following health maintenance concerns.   Zostavax      Comments:   Had a shingles shot at [facility] in [place]   on 12/01/2011.   I remember because it cost $200.   Please update my record.      Cheri Guppy

## 2014-01-16 ENCOUNTER — Telehealth: Payer: Self-pay

## 2014-01-16 NOTE — Telephone Encounter (Signed)
Relevant patient education assigned to patient using Emmi. ° °

## 2014-04-22 ENCOUNTER — Ambulatory Visit: Payer: Medicare Other | Admitting: Internal Medicine

## 2014-06-19 ENCOUNTER — Other Ambulatory Visit: Payer: Self-pay | Admitting: Internal Medicine

## 2014-07-10 ENCOUNTER — Ambulatory Visit: Payer: Medicare Other | Admitting: Internal Medicine

## 2014-07-22 ENCOUNTER — Telehealth: Payer: Self-pay | Admitting: *Deleted

## 2014-07-22 NOTE — Telephone Encounter (Signed)
PA started online for cyclobenzaprine. Form given to Dr. Gilford Rile for signature

## 2014-07-23 ENCOUNTER — Encounter: Payer: Self-pay | Admitting: Internal Medicine

## 2014-07-23 ENCOUNTER — Ambulatory Visit (INDEPENDENT_AMBULATORY_CARE_PROVIDER_SITE_OTHER): Payer: Medicare Other | Admitting: Internal Medicine

## 2014-07-23 VITALS — BP 134/78 | HR 66 | Temp 98.4°F | Ht 72.0 in | Wt 229.5 lb

## 2014-07-23 DIAGNOSIS — M549 Dorsalgia, unspecified: Secondary | ICD-10-CM | POA: Diagnosis not present

## 2014-07-23 DIAGNOSIS — E785 Hyperlipidemia, unspecified: Secondary | ICD-10-CM

## 2014-07-23 DIAGNOSIS — G4700 Insomnia, unspecified: Secondary | ICD-10-CM

## 2014-07-23 DIAGNOSIS — I10 Essential (primary) hypertension: Secondary | ICD-10-CM

## 2014-07-23 DIAGNOSIS — R5381 Other malaise: Secondary | ICD-10-CM

## 2014-07-23 DIAGNOSIS — L989 Disorder of the skin and subcutaneous tissue, unspecified: Secondary | ICD-10-CM | POA: Insufficient documentation

## 2014-07-23 DIAGNOSIS — E119 Type 2 diabetes mellitus without complications: Secondary | ICD-10-CM

## 2014-07-23 DIAGNOSIS — R5383 Other fatigue: Secondary | ICD-10-CM

## 2014-07-23 LAB — COMPREHENSIVE METABOLIC PANEL
ALBUMIN: 4.4 g/dL (ref 3.5–5.2)
ALT: 29 U/L (ref 0–53)
AST: 23 U/L (ref 0–37)
Alkaline Phosphatase: 52 U/L (ref 39–117)
BUN: 17 mg/dL (ref 6–23)
CALCIUM: 9.5 mg/dL (ref 8.4–10.5)
CO2: 28 meq/L (ref 19–32)
Chloride: 100 mEq/L (ref 96–112)
Creatinine, Ser: 0.9 mg/dL (ref 0.4–1.5)
GFR: 90.61 mL/min (ref >60.00)
Glucose, Bld: 85 mg/dL (ref 70–99)
POTASSIUM: 4 meq/L (ref 3.5–5.1)
Sodium: 137 mEq/L (ref 135–145)
Total Bilirubin: 1.1 mg/dL (ref 0.2–1.2)
Total Protein: 7.1 g/dL (ref 6.0–8.3)

## 2014-07-23 LAB — CBC WITH DIFFERENTIAL/PLATELET
Basophils Absolute: 0.1 10*3/uL (ref 0.0–0.1)
Basophils Relative: 1.3 % (ref 0.0–3.0)
EOS PCT: 2.4 % (ref 0.0–5.0)
Eosinophils Absolute: 0.1 10*3/uL (ref 0.0–0.7)
HCT: 41.3 % (ref 39.0–52.0)
Hemoglobin: 13.9 g/dL (ref 13.0–17.0)
LYMPHS PCT: 19.9 % (ref 12.0–46.0)
Lymphs Abs: 1.2 10*3/uL (ref 0.7–4.0)
MCHC: 33.6 g/dL (ref 30.0–36.0)
MCV: 93.5 fl (ref 78.0–100.0)
Monocytes Absolute: 0.5 10*3/uL (ref 0.1–1.0)
Monocytes Relative: 7.3 % (ref 3.0–12.0)
NEUTROS PCT: 69.1 % (ref 43.0–77.0)
Neutro Abs: 4.3 10*3/uL (ref 1.4–7.7)
PLATELETS: 249 10*3/uL (ref 150.0–400.0)
RBC: 4.41 Mil/uL (ref 4.22–5.81)
RDW: 13.1 % (ref 11.5–15.5)
WBC: 6.2 10*3/uL (ref 4.0–10.5)

## 2014-07-23 LAB — HEMOGLOBIN A1C: Hgb A1c MFr Bld: 6.2 % (ref 4.6–6.5)

## 2014-07-23 LAB — LIPID PANEL
CHOL/HDL RATIO: 4
Cholesterol: 158 mg/dL (ref 0–200)
HDL: 38.5 mg/dL — ABNORMAL LOW (ref >39.00)
NONHDL: 119.5
Triglycerides: 326 mg/dL — ABNORMAL HIGH (ref 0.0–149.0)
VLDL: 65.2 mg/dL — ABNORMAL HIGH (ref 0.0–40.0)

## 2014-07-23 LAB — LDL CHOLESTEROL, DIRECT: LDL DIRECT: 86.4 mg/dL

## 2014-07-23 LAB — TSH: TSH: 1.86 u[IU]/mL (ref 0.35–4.50)

## 2014-07-23 LAB — MICROALBUMIN / CREATININE URINE RATIO
Creatinine,U: 232.7 mg/dL
MICROALB UR: 9.8 mg/dL — AB (ref 0.0–1.9)
MICROALB/CREAT RATIO: 4.2 mg/g (ref 0.0–30.0)

## 2014-07-23 LAB — VITAMIN B12: Vitamin B-12: >1500 pg/mL — ABNORMAL HIGH (ref 211–911)

## 2014-07-23 MED ORDER — CYCLOBENZAPRINE HCL 10 MG PO TABS: 10.0000 mg | ORAL_TABLET | Freq: Three times a day (TID) | ORAL | Status: DC | PRN

## 2014-07-23 MED ORDER — ZOLPIDEM TARTRATE 5 MG PO TABS: 5.0000 mg | ORAL_TABLET | Freq: Every evening | ORAL | Status: DC | PRN

## 2014-07-23 MED ORDER — AMLODIPINE BESYLATE 10 MG PO TABS: 10.0000 mg | ORAL_TABLET | Freq: Every day | ORAL | Status: DC

## 2014-07-23 MED ORDER — AZELASTINE HCL 0.1 % NA SOLN: 1.0000 | Freq: Two times a day (BID) | NASAL | Status: DC

## 2014-07-23 MED ORDER — DULOXETINE HCL 30 MG PO CPEP: 30.0000 mg | ORAL_CAPSULE | Freq: Every day | ORAL | Status: DC

## 2014-07-23 NOTE — Assessment & Plan Note (Signed)
BP Readings from Last 3 Encounters:  07/23/14 134/78  01/14/14 140/88  10/02/13 136/80   BP well controlled on current medications. Renal function with labs today.

## 2014-07-23 NOTE — Assessment & Plan Note (Signed)
Scaling lesion left thumb. Will set up dermatology evaluation.

## 2014-07-23 NOTE — Assessment & Plan Note (Signed)
Symptoms well controlled with Flexeril. Will continue.

## 2014-07-23 NOTE — Progress Notes (Signed)
Pre visit review using our clinic review tool, if applicable. No additional management support is needed unless otherwise documented below in the visit note. 

## 2014-07-23 NOTE — Patient Instructions (Signed)
Labs today.  Follow up in 3 months and as needed. 

## 2014-07-23 NOTE — Assessment & Plan Note (Signed)
Symptoms well controlled with Ambien 5mg  dosing. Will continue.

## 2014-07-23 NOTE — Progress Notes (Signed)
Subjective:    Patient ID: Larry Roman, male    DOB: 09/07/1946, 68 y.o.   MRN: [id number]  HPI 68YO male presents for follow up.  DM - Morning BG near 120-125.  Feeling generally fatigued this summer. No focal symptoms. No chest pain, dyspnea, change in bowel habits.  Notes scaling of left thumb nail present for several months. Not painful.  Review of Systems  Constitutional: Positive for fatigue. Negative for fever, chills, activity change, appetite change and unexpected weight change.  Eyes: Negative for visual disturbance.  Respiratory: Negative for cough and shortness of breath.   Cardiovascular: Negative for chest pain, palpitations and leg swelling.  Gastrointestinal: Negative for nausea, vomiting, abdominal pain, diarrhea, constipation and abdominal distention.  Genitourinary: Negative for dysuria, urgency and difficulty urinating.  Musculoskeletal: Negative for arthralgias and gait problem.  Skin: Negative for color change and rash.  Hematological: Negative for adenopathy.  Psychiatric/Behavioral: Negative for sleep disturbance and dysphoric mood. The patient is not nervous/anxious.        Objective:    BP 134/78  Pulse 66  Temp(Src) 98.4 F (36.9 C) (Oral)  Ht 6' (1.829 m)  Wt 229 lb 8 oz (104.101 kg)  BMI 31.12 kg/m2  SpO2 96% Physical Exam  Constitutional: He is oriented to person, place, and time. He appears well-developed and well-nourished. No distress.  HENT:  Head: Normocephalic and atraumatic.  Right Ear: External ear normal.  Left Ear: External ear normal.  Nose: Nose normal.  Mouth/Throat: Oropharynx is clear and moist. No oropharyngeal exudate.  Eyes: Conjunctivae and EOM are normal. Pupils are equal, round, and reactive to light. Right eye exhibits no discharge. Left eye exhibits no discharge. No scleral icterus.  Neck: Normal range of motion. Neck supple. No tracheal deviation present. No thyromegaly present.  Cardiovascular: Normal rate,  regular rhythm and normal heart sounds.  Exam reveals no gallop and no friction rub.   No murmur heard. Pulmonary/Chest: Effort normal and breath sounds normal. No accessory muscle usage. Not tachypneic. No respiratory distress. He has no decreased breath sounds. He has no wheezes. He has no rhonchi. He has no rales. He exhibits no tenderness.  Musculoskeletal: Normal range of motion. He exhibits no edema.  Lymphadenopathy:    He has no cervical adenopathy.  Neurological: He is alert and oriented to person, place, and time. No cranial nerve deficit. Coordination normal.  Skin: Skin is warm and dry. No rash noted. He is not diaphoretic. No erythema. No pallor.     Psychiatric: He has a normal mood and affect. His behavior is normal. Judgment and thought content normal.          Assessment & Plan:   Problem List Items Addressed This Visit     Unprioritized   Back pain     Symptoms well controlled with Flexeril. Will continue.    Relevant Medications      DULoxetine (CYMBALTA) DR capsule      cyclobenzaprine (FLEXERIL) tablet   Diabetes mellitus type 2, controlled - Primary     Will check A1c with labs today. Continue Metformin.    Relevant Orders      Comprehensive metabolic panel      Hemoglobin A1c      Lipid panel      Microalbumin / creatinine urine ratio   Hypertension      BP Readings from Last 3 Encounters:  07/23/14 134/78  01/14/14 140/88  10/02/13 136/80   BP well controlled on current medications.  Renal function with labs today.    Relevant Medications      amLODIpine (NORVASC) tablet   Insomnia     Symptoms well controlled with Ambien 5mg  dosing. Will continue.    Relevant Medications      zolpidem (AMBIEN)  tablet   Other malaise and fatigue     Recent generalized fatigue. No focal symptoms. Will check CBC, TSH, B12, CMP with labs.    Relevant Orders      B12      TSH      CBC w/Diff   Thumb lesion     Scaling lesion left thumb. Will set up  dermatology evaluation.    Relevant Orders      Ambulatory referral to Dermatology       Return in about 3 months (around 10/23/2014) for Recheck of Diabetes.

## 2014-07-23 NOTE — Assessment & Plan Note (Signed)
Will check A1c with labs today. Continue Metformin. 

## 2014-07-23 NOTE — Assessment & Plan Note (Signed)
Recent generalized fatigue. No focal symptoms. Will check CBC, TSH, B12, CMP with labs.

## 2014-07-24 NOTE — Telephone Encounter (Signed)
Approved through 07/23/15

## 2014-07-29 ENCOUNTER — Telehealth: Payer: Self-pay | Admitting: *Deleted

## 2014-07-29 ENCOUNTER — Encounter: Payer: Self-pay | Admitting: Internal Medicine

## 2014-07-29 NOTE — Telephone Encounter (Signed)
Pt sent mychart, stating needing PA for Zolpidem. Pa sent to insurance for approval. PA denied due to the fact that he has not tried a non-high risk medication alternative formulary drug like temazepam, silenor, or rozerem. Ok to change to one of those options if he is agreeable?

## 2014-07-29 NOTE — Telephone Encounter (Signed)
PA started online for Zolpidem, awaiting response from insurance

## 2014-07-29 NOTE — Telephone Encounter (Signed)
Yes, I would recommend Temazepam 15mg  po qhs #30 if he is reasonable to try this.

## 2014-07-30 MED ORDER — TEMAZEPAM 15 MG PO CAPS: 15.0000 mg | ORAL_CAPSULE | Freq: Every evening | ORAL | Status: DC | PRN

## 2014-07-30 NOTE — Telephone Encounter (Signed)
Sent mychart

## 2014-07-30 NOTE — Telephone Encounter (Signed)
Rx phoned to pharmacy. Pt notified via mychart

## 2014-08-04 ENCOUNTER — Encounter: Payer: Self-pay | Admitting: Internal Medicine

## 2014-08-26 ENCOUNTER — Telehealth: Payer: Self-pay | Admitting: *Deleted

## 2014-08-26 NOTE — Telephone Encounter (Signed)
Pt has side effects of dry mouth with Temazepam. Started PA for Zolpidem, given to Dr. Gilford Rile for signature.

## 2014-08-26 NOTE — Addendum Note (Signed)
Addended by: Wynonia Lawman E on: 08/26/2014 02:20 PM   Modules accepted: Orders

## 2014-08-27 NOTE — Telephone Encounter (Signed)
Zolpidem approved. Pt notified.

## 2014-08-30 ENCOUNTER — Ambulatory Visit (INDEPENDENT_AMBULATORY_CARE_PROVIDER_SITE_OTHER): Payer: Medicare Other

## 2014-08-30 DIAGNOSIS — Z23 Encounter for immunization: Secondary | ICD-10-CM | POA: Diagnosis not present

## 2014-09-18 ENCOUNTER — Encounter: Payer: Self-pay | Admitting: Internal Medicine

## 2014-09-18 ENCOUNTER — Other Ambulatory Visit: Payer: Self-pay | Admitting: Internal Medicine

## 2014-09-20 ENCOUNTER — Encounter: Payer: Self-pay | Admitting: Internal Medicine

## 2014-09-20 ENCOUNTER — Ambulatory Visit (INDEPENDENT_AMBULATORY_CARE_PROVIDER_SITE_OTHER): Payer: Medicare Other | Admitting: Internal Medicine

## 2014-09-20 VITALS — BP 142/78 | HR 65 | Temp 98.0°F

## 2014-09-20 DIAGNOSIS — J0100 Acute maxillary sinusitis, unspecified: Secondary | ICD-10-CM

## 2014-09-20 MED ORDER — AMOXICILLIN-POT CLAVULANATE 875-125 MG PO TABS: 1.0000 | ORAL_TABLET | Freq: Two times a day (BID) | ORAL | Status: DC

## 2014-09-20 NOTE — Patient Instructions (Signed)
Start Augmentin twice daily.  Please call if symptoms are not improving.

## 2014-09-20 NOTE — Assessment & Plan Note (Signed)
Symptoms consistent with acute maxillary sinusitis. Will continue Augmentin for 10 day course. Prn antihistamines as needed. Avoid decongestants because of hypertension. Follow up prn if symptoms are not improving.

## 2014-09-20 NOTE — Progress Notes (Signed)
   Subjective:    Patient ID: Larry Roman, male    DOB: [birth year], 68 y.o.   MRN: [id number]  HPI 68YO male presents for acute visit.  Developed "itchy ears" and sore throat with burning in nasal passages on Wednesday. Started Rx for Augmentin which seems to be helping, but is out of meds. No fever, chills. No shortness of breath. Occasional dry cough. Taking some OTC decongestants on occasion with some improvement in symptoms. Having continued bilateral sinus pressure.  Review of Systems  Constitutional: Negative for fever, chills, activity change and fatigue.  HENT: Positive for congestion, postnasal drip, sinus pressure and sore throat. Negative for ear discharge, ear pain, hearing loss, nosebleeds, rhinorrhea, sneezing, tinnitus, trouble swallowing and voice change.   Eyes: Negative for discharge, redness, itching and visual disturbance.  Respiratory: Positive for cough. Negative for chest tightness, shortness of breath, wheezing and stridor.   Cardiovascular: Negative for chest pain and leg swelling.  Musculoskeletal: Negative for arthralgias, myalgias, neck pain and neck stiffness.  Skin: Negative for color change and rash.  Neurological: Negative for dizziness, facial asymmetry and headaches.  Psychiatric/Behavioral: Negative for sleep disturbance.       Objective:    BP 142/78  Pulse 65  Temp(Src) 98 F (36.7 C)  SpO2 96% Physical Exam  Constitutional: He is oriented to person, place, and time. He appears well-developed and well-nourished. No distress.  HENT:  Head: Normocephalic and atraumatic.  Right Ear: External ear normal.  Left Ear: External ear normal.  Nose: Mucosal edema present. Right sinus exhibits maxillary sinus tenderness. Left sinus exhibits maxillary sinus tenderness.  Mouth/Throat: Oropharynx is clear and moist. No oropharyngeal exudate.  Eyes: Conjunctivae and EOM are normal. Pupils are equal, round, and reactive to light. Right eye exhibits no  discharge. Left eye exhibits no discharge. No scleral icterus.  Neck: Normal range of motion. Neck supple. No tracheal deviation present. No thyromegaly present.  Cardiovascular: Normal rate, regular rhythm and normal heart sounds.  Exam reveals no gallop and no friction rub.   No murmur heard. Pulmonary/Chest: Effort normal and breath sounds normal. No accessory muscle usage. Not tachypneic. No respiratory distress. He has no decreased breath sounds. He has no wheezes. He has no rhonchi. He has no rales. He exhibits no tenderness.  Musculoskeletal: Normal range of motion. He exhibits no edema.  Lymphadenopathy:    He has no cervical adenopathy.  Neurological: He is alert and oriented to person, place, and time. No cranial nerve deficit. Coordination normal.  Skin: Skin is warm and dry. No rash noted. He is not diaphoretic. No erythema. No pallor.  Psychiatric: He has a normal mood and affect. His behavior is normal. Judgment and thought content normal.          Assessment & Plan:   Problem List Items Addressed This Visit     Unprioritized   Acute maxillary sinusitis - Primary     Symptoms consistent with acute maxillary sinusitis. Will continue Augmentin for 10 day course. Prn antihistamines as needed. Avoid decongestants because of hypertension. Follow up prn if symptoms are not improving.    Relevant Medications      AMOXICILLIN-POT CLAVULANATE 875-125 MG PO TABS       Return if symptoms worsen or fail to improve.

## 2014-09-24 DIAGNOSIS — L603 Nail dystrophy: Secondary | ICD-10-CM | POA: Diagnosis not present

## 2014-09-24 DIAGNOSIS — L708 Other acne: Secondary | ICD-10-CM | POA: Diagnosis not present

## 2014-09-24 DIAGNOSIS — B359 Dermatophytosis, unspecified: Secondary | ICD-10-CM | POA: Diagnosis not present

## 2014-10-17 ENCOUNTER — Other Ambulatory Visit: Payer: Self-pay | Admitting: Internal Medicine

## 2014-10-23 ENCOUNTER — Ambulatory Visit (INDEPENDENT_AMBULATORY_CARE_PROVIDER_SITE_OTHER): Payer: Medicare Other | Admitting: Internal Medicine

## 2014-10-23 ENCOUNTER — Encounter: Payer: Self-pay | Admitting: Internal Medicine

## 2014-10-23 VITALS — BP 157/87 | HR 68 | Temp 98.6°F | Ht 72.0 in | Wt 233.2 lb

## 2014-10-23 DIAGNOSIS — M549 Dorsalgia, unspecified: Secondary | ICD-10-CM

## 2014-10-23 DIAGNOSIS — E119 Type 2 diabetes mellitus without complications: Secondary | ICD-10-CM | POA: Diagnosis not present

## 2014-10-23 DIAGNOSIS — I10 Essential (primary) hypertension: Secondary | ICD-10-CM

## 2014-10-23 DIAGNOSIS — J0100 Acute maxillary sinusitis, unspecified: Secondary | ICD-10-CM

## 2014-10-23 DIAGNOSIS — M545 Low back pain: Secondary | ICD-10-CM

## 2014-10-23 MED ORDER — ZOLPIDEM TARTRATE 5 MG PO TABS: 5.0000 mg | ORAL_TABLET | Freq: Every evening | ORAL | Status: DC | PRN

## 2014-10-23 MED ORDER — METFORMIN HCL 500 MG PO TABS: 500.0000 mg | ORAL_TABLET | Freq: Two times a day (BID) | ORAL | Status: DC

## 2014-10-23 MED ORDER — DULOXETINE HCL 30 MG PO CPEP: 30.0000 mg | ORAL_CAPSULE | Freq: Every day | ORAL | Status: DC

## 2014-10-23 MED ORDER — AMLODIPINE BESYLATE 10 MG PO TABS: 10.0000 mg | ORAL_TABLET | Freq: Every day | ORAL | Status: DC

## 2014-10-23 MED ORDER — LOSARTAN POTASSIUM 100 MG PO TABS: 100.0000 mg | ORAL_TABLET | Freq: Every day | ORAL | Status: DC

## 2014-10-23 MED ORDER — HYDROCODONE-ACETAMINOPHEN 5-325 MG PO TABS: 1.0000 | ORAL_TABLET | Freq: Four times a day (QID) | ORAL | Status: DC | PRN

## 2014-10-23 MED ORDER — AMOXICILLIN-POT CLAVULANATE 875-125 MG PO TABS: 1.0000 | ORAL_TABLET | Freq: Two times a day (BID) | ORAL | Status: DC

## 2014-10-23 NOTE — Assessment & Plan Note (Signed)
Symptoms and exam consistent with acute maxillary sinusitis. Will start Augmentin. Use probiotic with Augmentin. Follow up by email next week and in 3 weeks for recheck.

## 2014-10-23 NOTE — Progress Notes (Signed)
Pre visit review using our clinic review tool, if applicable. No additional management support is needed unless otherwise documented below in the visit note. 

## 2014-10-23 NOTE — Assessment & Plan Note (Signed)
Chronic. Continue prn Hydrocodone for severe pain only.

## 2014-10-23 NOTE — Assessment & Plan Note (Signed)
Lab Results  Component Value Date   HGBA1C 6.2 07/23/2014   Will check A1c with labs today. Continue metformin.

## 2014-10-23 NOTE — Assessment & Plan Note (Signed)
BP Readings from Last 3 Encounters:  10/23/14 157/87  09/20/14 142/78  07/23/14 134/78   BP elevated today, likely because he has been using Bronkaid (with ephedrine). Will have him hold this medication. Renal function with labs. Recheck BP in 3 weeks.

## 2014-10-23 NOTE — Patient Instructions (Signed)
Start Augmentin twice daily.  Follow up recheck in 3 weeks.

## 2014-10-23 NOTE — Progress Notes (Signed)
Subjective:    Patient ID: Larry Roman, male    DOB: [birth year], 68 y.o.   MRN: [id number]  HPI 68YO male presents for follow up.  Tuesday developed nasal congestion, pressure, sore throat, hoarse voice. No fever, chills. Took Bronkaid 3-4 times yesterday with some improvement.  DM - BG well controlled recently. Did not bring record. Compliant with medications.  Review of Systems  Constitutional: Negative for fever, chills, activity change, appetite change, fatigue and unexpected weight change.  HENT: Positive for congestion, ear pain, postnasal drip, rhinorrhea, sinus pressure, sore throat and voice change. Negative for tinnitus.   Eyes: Negative for visual disturbance.  Respiratory: Positive for cough and shortness of breath. Negative for wheezing.   Cardiovascular: Negative for chest pain, palpitations and leg swelling.  Gastrointestinal: Negative for abdominal pain and abdominal distention.  Genitourinary: Negative for dysuria, urgency and difficulty urinating.  Musculoskeletal: Negative for arthralgias and gait problem.  Skin: Negative for color change and rash.  Hematological: Negative for adenopathy.  Psychiatric/Behavioral: Negative for sleep disturbance and dysphoric mood. The patient is not nervous/anxious.        Objective:    BP 157/87 mmHg  Pulse 68  Temp(Src) 98.6 F (37 C) (Oral)  Ht 6' (1.829 m)  Wt 233 lb 4 oz (105.802 kg)  BMI 31.63 kg/m2  SpO2 98% Physical Exam  Constitutional: He is oriented to person, place, and time. He appears well-developed and well-nourished. No distress.  HENT:  Head: Normocephalic and atraumatic.  Right Ear: External ear normal. No swelling or tenderness. Tympanic membrane is bulging. A middle ear effusion is present.  Left Ear: External ear normal. No swelling or tenderness. Tympanic membrane is bulging. A middle ear effusion is present.  Nose: Mucosal edema present. Right sinus exhibits maxillary sinus tenderness. Left sinus  exhibits maxillary sinus tenderness.  Mouth/Throat: Oropharynx is clear and moist. No oropharyngeal exudate.  Eyes: Conjunctivae and EOM are normal. Pupils are equal, round, and reactive to light. Right eye exhibits no discharge. Left eye exhibits no discharge. No scleral icterus.  Neck: Normal range of motion. Neck supple. No tracheal deviation present. No thyromegaly present.  Cardiovascular: Normal rate, regular rhythm and normal heart sounds.  Exam reveals no gallop and no friction rub.   No murmur heard. Pulmonary/Chest: Effort normal and breath sounds normal. No accessory muscle usage. No tachypnea. No respiratory distress. He has no decreased breath sounds. He has no wheezes. He has no rhonchi. He has no rales. He exhibits no tenderness.  Musculoskeletal: Normal range of motion. He exhibits no edema.  Lymphadenopathy:    He has no cervical adenopathy.  Neurological: He is alert and oriented to person, place, and time. No cranial nerve deficit. Coordination normal.  Skin: Skin is warm and dry. No rash noted. He is not diaphoretic. No erythema. No pallor.  Psychiatric: He has a normal mood and affect. His behavior is normal. Judgment and thought content normal.          Assessment & Plan:   Problem List Items Addressed This Visit      Unprioritized   Acute maxillary sinusitis    Symptoms and exam consistent with acute maxillary sinusitis. Will start Augmentin. Use probiotic with Augmentin. Follow up by email next week and in 3 weeks for recheck.    Relevant Medications      amoxicillin-clavulanate (AUGMENTIN) 875-125 MG per tablet   Back pain    Chronic. Continue prn Hydrocodone for severe pain only.    Relevant  Medications      HYDROcodone-acetaminophen (NORCO/VICODIN) 5-325 MG per tablet      DULoxetine (CYMBALTA) DR capsule   Diabetes mellitus type 2, controlled - Primary    Lab Results  Component Value Date   HGBA1C 6.2 07/23/2014   Will check A1c with labs today.  Continue metformin.    Relevant Medications      metFORMIN (GLUCOPHAGE) tablet      losartan (COZAAR) tablet   Other Relevant Orders      Comprehensive metabolic panel      Hemoglobin A1c   Hypertension    BP Readings from Last 3 Encounters:  10/23/14 157/87  09/20/14 142/78  07/23/14 134/78   BP elevated today, likely because he has been using Bronkaid (with ephedrine). Will have him hold this medication. Renal function with labs. Recheck BP in 3 weeks.    Relevant Medications      amLODIpine (NORVASC) tablet      losartan (COZAAR) tablet       Return in about 3 weeks (around 11/13/2014) for Recheck.

## 2014-10-24 LAB — COMPREHENSIVE METABOLIC PANEL
ALT: 32 U/L (ref 0–53)
AST: 29 U/L (ref 0–37)
Albumin: 4.3 g/dL (ref 3.5–5.2)
Alkaline Phosphatase: 57 U/L (ref 39–117)
BILIRUBIN TOTAL: 0.7 mg/dL (ref 0.2–1.2)
BUN: 15 mg/dL (ref 6–23)
CO2: 27 meq/L (ref 19–32)
CREATININE: 0.8 mg/dL (ref 0.4–1.5)
Calcium: 9.4 mg/dL (ref 8.4–10.5)
Chloride: 102 mEq/L (ref 96–112)
GFR: 100.93 mL/min (ref >60.00)
GLUCOSE: 97 mg/dL (ref 70–99)
Potassium: 3.6 mEq/L (ref 3.5–5.1)
Sodium: 137 mEq/L (ref 135–145)
Total Protein: 6.8 g/dL (ref 6.0–8.3)

## 2014-10-24 LAB — HEMOGLOBIN A1C: Hgb A1c MFr Bld: 6.2 % (ref 4.6–6.5)

## 2014-10-27 DIAGNOSIS — B351 Tinea unguium: Secondary | ICD-10-CM | POA: Diagnosis not present

## 2014-10-27 DIAGNOSIS — L708 Other acne: Secondary | ICD-10-CM | POA: Diagnosis not present

## 2014-10-27 DIAGNOSIS — L603 Nail dystrophy: Secondary | ICD-10-CM | POA: Diagnosis not present

## 2014-10-27 DIAGNOSIS — D2339 Other benign neoplasm of skin of other parts of face: Secondary | ICD-10-CM | POA: Diagnosis not present

## 2014-10-29 ENCOUNTER — Encounter: Payer: Self-pay | Admitting: Internal Medicine

## 2014-11-10 ENCOUNTER — Other Ambulatory Visit: Payer: Self-pay | Admitting: Internal Medicine

## 2014-11-12 ENCOUNTER — Encounter: Payer: Self-pay | Admitting: Internal Medicine

## 2014-11-17 ENCOUNTER — Telehealth: Payer: Self-pay | Admitting: Internal Medicine

## 2014-11-17 NOTE — Telephone Encounter (Signed)
The patient stated he was treated for a sinus infection over 3 weeks ago and he wants to know if something could be called to the pharmacy for a sinus infection or does he have to come in to see the physician.

## 2014-11-17 NOTE — Telephone Encounter (Signed)
The patient has been contacted he will see Doss on 12.15.16.

## 2014-11-17 NOTE — Telephone Encounter (Signed)
Per [place] [facility] Board guidelines, he will need to be seen in a visit. We can work him in tomorrow if needed.

## 2014-11-18 ENCOUNTER — Ambulatory Visit (INDEPENDENT_AMBULATORY_CARE_PROVIDER_SITE_OTHER): Payer: Medicare Other | Admitting: Nurse Practitioner

## 2014-11-18 ENCOUNTER — Encounter: Payer: Self-pay | Admitting: Nurse Practitioner

## 2014-11-18 VITALS — BP 142/76 | HR 73 | Temp 98.0°F | Resp 12 | Ht 72.0 in | Wt 228.0 lb

## 2014-11-18 DIAGNOSIS — J0101 Acute recurrent maxillary sinusitis: Secondary | ICD-10-CM | POA: Diagnosis not present

## 2014-11-18 MED ORDER — METHYLPREDNISOLONE (PAK) 4 MG PO TABS: ORAL_TABLET | ORAL | Status: DC

## 2014-11-18 MED ORDER — LEVOFLOXACIN 250 MG PO TABS: 250.0000 mg | ORAL_TABLET | Freq: Every day | ORAL | Status: DC

## 2014-11-18 NOTE — Progress Notes (Signed)
Subjective:    Patient ID: Larry Roman, male    DOB: 18-May-1946, 68 y.o.   MRN: [id number]  HPI  [name] is a 68 yo male with a hx of Acute Maxillary Sinusitis.   1) Congestion, drainage, tenderness behind ears, chest congestion- green, no rhinorrhea, no fever, Ears feel congested one is not worse than the other. Treatment to date:  10/27/14, was better Z-pack with Augmentin (pt took wife's prescription of z-pack with his rx for augmentin).  Astelin- Fine  Claritin- every day  Alkaseltzer plus, Dayquil- not helpful Ibuprofen q4h- Helps   Review of Systems  Constitutional: Positive for fatigue. Negative for fever, chills and diaphoresis.  HENT: Positive for congestion, ear pain, postnasal drip, sinus pressure and sneezing. Negative for nosebleeds, rhinorrhea, sore throat and trouble swallowing.        Frontal and maxillary pressure, frontal worse  Eyes: Negative for visual disturbance.  Respiratory: Positive for cough. Negative for chest tightness, shortness of breath and wheezing.   Cardiovascular: Negative for chest pain, palpitations and leg swelling.  Gastrointestinal: Negative for nausea, vomiting, abdominal pain and diarrhea.  Musculoskeletal: Negative for myalgias.  Skin: Negative for rash.  Neurological: Positive for headaches. Negative for dizziness and light-headedness.   Past Medical History  Diagnosis Date  . HTN (hypertension)   . DM (diabetes mellitus)   . Depression   . Other allergy, other than to medicinal agents   . Extrinsic asthma, unspecified     childhood    History   Social History  . Marital Status: Married    Spouse Name: N/A    Number of Children: 2  . Years of Education: N/A   Occupational History  .     Social History Main Topics  . Smoking status: Never Smoker   . Smokeless tobacco: Never Used     Comment: tobacco use - no  . Alcohol Use: Yes  . Drug Use: No  . Sexual Activity: Not on file   Other Topics Concern  . Not on file     Social History Narrative    Past Surgical History  Procedure Laterality Date  . Tonsillectomy    . Back surgery      Family History  Problem Relation Age of Onset  . Heart failure Father   . Heart attack Mother     No Known Allergies  Current Outpatient Prescriptions on File Prior to Visit  Medication Sig Dispense Refill  . amLODipine (NORVASC) 10 MG tablet TAKE 1 TABLET BY MOUTH EVERY DAY 90 tablet 2  . aspirin EC 81 MG tablet Take 81 mg by mouth daily.      Marland Kitchen azelastine (ASTELIN) 0.1 % nasal spray USE 1 SPRAY IN EACH NOSTRIL TWICE DAILY AS DIRECTED 30 mL 2  . Cholecalciferol (VITAMIN D3) 2000 UNITS TABS Take by mouth.    . Cyanocobalamin (B-12) 5000 MCG SUBL Place under the tongue.    . DULoxetine (CYMBALTA) 30 MG capsule Take 1 capsule (30 mg total) by mouth daily. 90 capsule 3  . furosemide (LASIX) 20 MG tablet Take 1 tablet (20 mg total) by mouth daily. 90 tablet 3  . glucose blood test strip One Touch Test strips Use to check blood sugar 2-3 times daily 100 each 11  . loratadine (CLARITIN) 10 MG tablet Take 10 mg by mouth daily.    Marland Kitchen losartan (COZAAR) 100 MG tablet Take 1 tablet (100 mg total) by mouth daily. 90 tablet 3  . metFORMIN (GLUCOPHAGE)  500 MG tablet Take 1 tablet (500 mg total) by mouth 2 (two) times daily with a meal. 180 tablet 1  . ranitidine (ZANTAC) 75 MG tablet Take 75 mg by mouth daily as needed for heartburn.    . zolpidem (AMBIEN) 5 MG tablet Take 1 tablet (5 mg total) by mouth at bedtime as needed for sleep. 30 tablet 4  . cyclobenzaprine (FLEXERIL) 10 MG tablet Take 1 tablet (10 mg total) by mouth 3 (three) times daily as needed. (Patient not taking: Reported on 11/18/2014) 90 tablet 3  . HYDROcodone-acetaminophen (NORCO/VICODIN) 5-325 MG per tablet Take 1 tablet by mouth every 6 (six) hours as needed for moderate pain. (Patient not taking: Reported on 11/18/2014) 60 tablet 0   No current facility-administered medications on file prior to visit.        Objective:   Physical Exam  Constitutional: He is oriented to person, place, and time. He appears well-developed and well-nourished. No distress.  HENT:  Head: Normocephalic and atraumatic.  Right Ear: External ear normal.  Left Ear: External ear normal.  Mouth/Throat: No oropharyngeal exudate.  Eyes: Conjunctivae and EOM are normal. Pupils are equal, round, and reactive to light. Right eye exhibits no discharge. Left eye exhibits no discharge. No scleral icterus.  Cardiovascular: Normal rate and regular rhythm.   Pulmonary/Chest: Effort normal and breath sounds normal. No respiratory distress. He has no wheezes. He has no rales. He exhibits no tenderness.  Neurological: He is alert and oriented to person, place, and time.  Skin: Skin is warm and dry. No rash noted. He is not diaphoretic.  Psychiatric: He has a normal mood and affect. His behavior is normal. Judgment and thought content normal.      BP 142/76 mmHg  Pulse 73  Temp(Src) 98 F (36.7 C) (Oral)  Resp 12  Ht 6' (1.829 m)  Wt 228 lb (103.42 kg)  BMI 30.92 kg/m2  SpO2 97%      Assessment & Plan:

## 2014-11-18 NOTE — Progress Notes (Signed)
Pre visit review using our clinic review tool, if applicable. No additional management support is needed unless otherwise documented below in the visit note. 

## 2014-11-18 NOTE — Patient Instructions (Signed)
Call us if symptoms worsen or do not improve.   Please finish all antibiotics.   Prednisone will help with inflammation.   Happy Holidays!

## 2014-11-19 NOTE — Assessment & Plan Note (Addendum)
Pt took wife's z-pack and augmentin without relief. Will try Levaquin x 5 days. Call us if symptoms worsen or do not improve. Prednisone taper for inflammation. ENT is next step if not improved.

## 2014-11-27 ENCOUNTER — Ambulatory Visit: Payer: Medicare Other | Admitting: Internal Medicine

## 2014-12-05 HISTORY — PX: CATARACT EXTRACTION, BILATERAL: SHX1313

## 2014-12-18 ENCOUNTER — Telehealth: Payer: Self-pay | Admitting: *Deleted

## 2014-12-18 MED ORDER — GLUCOSE BLOOD VI STRP: ORAL_STRIP | Status: DC

## 2014-12-18 NOTE — Telephone Encounter (Signed)
Fine to check blood sugar just once per day

## 2014-12-18 NOTE — Telephone Encounter (Signed)
Rx sent to pharmacy by escript  

## 2014-12-18 NOTE — Telephone Encounter (Signed)
Fax from [facility], Medicare requires additional documentation if needing to check sugars more than 1x/day for non-insulin treated. Ok to change Rx to once daily checking or send 6 months of progress notes indicating why needs to check more than once daily?

## 2014-12-24 DIAGNOSIS — H2513 Age-related nuclear cataract, bilateral: Secondary | ICD-10-CM | POA: Diagnosis not present

## 2015-01-09 DIAGNOSIS — H2513 Age-related nuclear cataract, bilateral: Secondary | ICD-10-CM | POA: Diagnosis not present

## 2015-01-19 ENCOUNTER — Ambulatory Visit: Payer: Self-pay | Admitting: Ophthalmology

## 2015-01-19 DIAGNOSIS — Z7982 Long term (current) use of aspirin: Secondary | ICD-10-CM | POA: Diagnosis not present

## 2015-01-19 DIAGNOSIS — E119 Type 2 diabetes mellitus without complications: Secondary | ICD-10-CM | POA: Diagnosis not present

## 2015-01-19 DIAGNOSIS — M549 Dorsalgia, unspecified: Secondary | ICD-10-CM | POA: Diagnosis not present

## 2015-01-19 DIAGNOSIS — H2513 Age-related nuclear cataract, bilateral: Secondary | ICD-10-CM | POA: Diagnosis not present

## 2015-01-19 DIAGNOSIS — Z79899 Other long term (current) drug therapy: Secondary | ICD-10-CM | POA: Diagnosis not present

## 2015-01-19 DIAGNOSIS — I10 Essential (primary) hypertension: Secondary | ICD-10-CM | POA: Diagnosis not present

## 2015-01-19 DIAGNOSIS — R531 Weakness: Secondary | ICD-10-CM | POA: Diagnosis not present

## 2015-01-19 DIAGNOSIS — I4510 Unspecified right bundle-branch block: Secondary | ICD-10-CM | POA: Diagnosis not present

## 2015-01-19 DIAGNOSIS — F329 Major depressive disorder, single episode, unspecified: Secondary | ICD-10-CM | POA: Diagnosis not present

## 2015-01-19 DIAGNOSIS — H2511 Age-related nuclear cataract, right eye: Secondary | ICD-10-CM | POA: Diagnosis not present

## 2015-02-06 ENCOUNTER — Other Ambulatory Visit: Payer: Self-pay | Admitting: Internal Medicine

## 2015-02-08 ENCOUNTER — Other Ambulatory Visit: Payer: Self-pay | Admitting: Internal Medicine

## 2015-02-09 NOTE — Telephone Encounter (Signed)
Last visit: 10/23/14

## 2015-02-09 NOTE — Telephone Encounter (Signed)
Called to pharmacy 

## 2015-02-12 ENCOUNTER — Encounter: Payer: Self-pay | Admitting: Nurse Practitioner

## 2015-02-12 ENCOUNTER — Encounter: Payer: Self-pay | Admitting: Internal Medicine

## 2015-02-12 ENCOUNTER — Ambulatory Visit (INDEPENDENT_AMBULATORY_CARE_PROVIDER_SITE_OTHER): Payer: Medicare Other | Admitting: Nurse Practitioner

## 2015-02-12 VITALS — BP 138/78 | HR 77 | Temp 98.7°F | Resp 14 | Ht 72.0 in | Wt 228.4 lb

## 2015-02-12 DIAGNOSIS — J0141 Acute recurrent pansinusitis: Secondary | ICD-10-CM | POA: Diagnosis not present

## 2015-02-12 NOTE — Progress Notes (Signed)
Subjective:    Patient ID: Larry Ramus., male    DOB: [birth year], 69 y.o.   MRN: [id number]  HPI  Larry Roman is a 69 yo male with a CC of sinusitis and ear pressure x 2 days.   1) First saw Dr. Gilford Rile in October, Augmentin was given for 10 days.  Augmentin in November after seeing Dr. Gilford Rile again.  Saw me in December and Levaquin x 5 days with Prednisone taper  Current symptoms: 99 temp last night, frontal pressure equal on both sides worse than maxillary pressure, which is also equal on both sides.  Tried: Alkaseltzer plus- not helpful   Review of Systems  Constitutional: Negative for fever, chills, diaphoresis and fatigue.  HENT: Positive for congestion, ear pain and sinus pressure. Negative for ear discharge, facial swelling, nosebleeds, postnasal drip, rhinorrhea, sneezing and sore throat.        Ear pressure bilaterally  Eyes: Positive for discharge. Negative for visual disturbance.  Respiratory: Positive for cough. Negative for chest tightness, shortness of breath and wheezing.   Cardiovascular: Negative for chest pain, palpitations and leg swelling.  Gastrointestinal: Negative for nausea, vomiting and diarrhea.  Skin: Negative for rash.  Neurological: Positive for headaches. Negative for dizziness, weakness and numbness.  Psychiatric/Behavioral: The patient is not nervous/anxious.    Past Medical History  Diagnosis Date  . HTN (hypertension)   . DM (diabetes mellitus)   . Depression   . Other allergy, other than to medicinal agents   . Extrinsic asthma, unspecified     childhood    History   Social History  . Marital Status: Married    Spouse Name: N/A  . Number of Children: 2  . Years of Education: N/A   Occupational History  .     Social History Main Topics  . Smoking status: Never Smoker   . Smokeless tobacco: Never Used     Comment: tobacco use - no  . Alcohol Use: Yes  . Drug Use: No  . Sexual Activity: Not on file   Other Topics Concern  .  Not on file   Social History Narrative    Past Surgical History  Procedure Laterality Date  . Tonsillectomy    . Back surgery      Family History  Problem Relation Age of Onset  . Heart failure Father   . Heart attack Mother     No Known Allergies  Current Outpatient Prescriptions on File Prior to Visit  Medication Sig Dispense Refill  . amLODipine (NORVASC) 10 MG tablet TAKE 1 TABLET BY MOUTH EVERY DAY 90 tablet 2  . aspirin EC 81 MG tablet Take 81 mg by mouth daily.      Marland Kitchen azelastine (ASTELIN) 0.1 % nasal spray USE 1 SPRAY IN EACH NOSTRIL TWICE DAILY AS DIRECTED 30 mL 2  . Cholecalciferol (VITAMIN D3) 2000 UNITS TABS Take by mouth.    . Cyanocobalamin (B-12) 5000 MCG SUBL Place under the tongue.    . cyclobenzaprine (FLEXERIL) 10 MG tablet Take 1 tablet (10 mg total) by mouth 3 (three) times daily as needed. 90 tablet 3  . DULoxetine (CYMBALTA) 30 MG capsule Take 1 capsule (30 mg total) by mouth daily. 90 capsule 3  . furosemide (LASIX) 20 MG tablet TAKE 1 TABLET EVERY DAY 90 tablet 3  . glucose blood test strip One Touch Test strips Use to check blood once daily Dx E11.9 50 each 5  . HYDROcodone-acetaminophen (NORCO/VICODIN) 5-325 MG per tablet  Take 1 tablet by mouth every 6 (six) hours as needed for moderate pain. 60 tablet 0  . loratadine (CLARITIN) 10 MG tablet Take 10 mg by mouth daily.    Marland Kitchen losartan (COZAAR) 100 MG tablet Take 1 tablet (100 mg total) by mouth daily. 90 tablet 3  . metFORMIN (GLUCOPHAGE) 500 MG tablet Take 1 tablet (500 mg total) by mouth 2 (two) times daily with a meal. 180 tablet 1  . ranitidine (ZANTAC) 75 MG tablet Take 75 mg by mouth daily as needed for heartburn.    . zolpidem (AMBIEN) 5 MG tablet TAKE 1 TABLET BY MOUTH AT BEDTIME AS NEEDED 90 tablet 1   No current facility-administered medications on file prior to visit.       Objective:   Physical Exam  Constitutional: He is oriented to person, place, and time. He appears well-developed and  well-nourished. No distress.  BP 138/78 mmHg  Pulse 77  Temp(Src) 98.7 F (37.1 C) (Oral)  Resp 14  Ht 6' (1.829 m)  Wt 228 lb 6.4 oz (103.602 kg)  BMI 30.97 kg/m2  SpO2 97%   HENT:  Head: Normocephalic and atraumatic.  Right Ear: External ear normal.  Left Ear: External ear normal.  Mouth/Throat: No oropharyngeal exudate.  TM's blocked by cerumen on left and right ear canal was red with some scabbing seen- states he uses q-tips.  Eyes: Conjunctivae and EOM are normal. Pupils are equal, round, and reactive to light. Right eye exhibits no discharge. Left eye exhibits no discharge. No scleral icterus.  Neck: Normal range of motion. Neck supple. No thyromegaly present.  Cardiovascular: Normal rate, regular rhythm, normal heart sounds and intact distal pulses.  Exam reveals no gallop and no friction rub.   No murmur heard. Pulmonary/Chest: Effort normal and breath sounds normal. No respiratory distress. He has no wheezes. He has no rales. He exhibits no tenderness.  Lymphadenopathy:    He has no cervical adenopathy.  Neurological: He is alert and oriented to person, place, and time.  Skin: Skin is warm and dry. No rash noted. He is not diaphoretic.  Psychiatric: He has a normal mood and affect. His behavior is normal. Judgment and thought content normal.      Assessment & Plan:

## 2015-02-12 NOTE — Progress Notes (Signed)
Pre visit review using our clinic review tool, if applicable. No additional management support is needed unless otherwise documented below in the visit note. 

## 2015-02-12 NOTE — Assessment & Plan Note (Addendum)
Pt has multiple visits (4) in 6 months for sinus pressure and other symptoms. Instead of antibiotics today I will send him to ENT for evaluation and treatment. Asked him to try NeilMed OTC sinus rinses. FU prn worsening/failure to improve.

## 2015-02-12 NOTE — Patient Instructions (Signed)
NeilMed rinse over the counter at any drug store. This will help clear your sinuses.   See Bonnita Nasuti for your referral to ENT.

## 2015-02-16 DIAGNOSIS — J301 Allergic rhinitis due to pollen: Secondary | ICD-10-CM | POA: Diagnosis not present

## 2015-02-16 DIAGNOSIS — J324 Chronic pansinusitis: Secondary | ICD-10-CM | POA: Diagnosis not present

## 2015-02-21 ENCOUNTER — Other Ambulatory Visit: Payer: Self-pay | Admitting: Internal Medicine

## 2015-02-25 DIAGNOSIS — H2512 Age-related nuclear cataract, left eye: Secondary | ICD-10-CM | POA: Diagnosis not present

## 2015-03-09 ENCOUNTER — Ambulatory Visit
Admit: 2015-03-09 | Disposition: A | Discharge status: Home / Self Care | Payer: Self-pay | Attending: Ophthalmology | Admitting: Ophthalmology

## 2015-03-09 DIAGNOSIS — H2512 Age-related nuclear cataract, left eye: Secondary | ICD-10-CM | POA: Diagnosis not present

## 2015-03-09 DIAGNOSIS — I499 Cardiac arrhythmia, unspecified: Secondary | ICD-10-CM | POA: Diagnosis not present

## 2015-03-09 DIAGNOSIS — I10 Essential (primary) hypertension: Secondary | ICD-10-CM | POA: Diagnosis not present

## 2015-03-09 DIAGNOSIS — K5790 Diverticulosis of intestine, part unspecified, without perforation or abscess without bleeding: Secondary | ICD-10-CM | POA: Diagnosis not present

## 2015-03-09 DIAGNOSIS — E119 Type 2 diabetes mellitus without complications: Secondary | ICD-10-CM | POA: Diagnosis not present

## 2015-03-09 DIAGNOSIS — Z7982 Long term (current) use of aspirin: Secondary | ICD-10-CM | POA: Diagnosis not present

## 2015-03-09 DIAGNOSIS — Z791 Long term (current) use of non-steroidal anti-inflammatories (NSAID): Secondary | ICD-10-CM | POA: Diagnosis not present

## 2015-03-09 DIAGNOSIS — F329 Major depressive disorder, single episode, unspecified: Secondary | ICD-10-CM | POA: Diagnosis not present

## 2015-03-09 DIAGNOSIS — Z79899 Other long term (current) drug therapy: Secondary | ICD-10-CM | POA: Diagnosis not present

## 2015-03-09 DIAGNOSIS — J45909 Unspecified asthma, uncomplicated: Secondary | ICD-10-CM | POA: Diagnosis not present

## 2015-03-11 DIAGNOSIS — J324 Chronic pansinusitis: Secondary | ICD-10-CM | POA: Diagnosis not present

## 2015-03-11 DIAGNOSIS — H6063 Unspecified chronic otitis externa, bilateral: Secondary | ICD-10-CM | POA: Diagnosis not present

## 2015-03-20 ENCOUNTER — Telehealth: Payer: Self-pay

## 2015-03-20 NOTE — Telephone Encounter (Signed)
PA needed for zolpidem. PA completed on cover my meds. Awaiting a response at this time.

## 2015-03-23 NOTE — Telephone Encounter (Signed)
PA approved. Approval authorizes coverage through 12/20/14 til 12/05/15.

## 2015-04-09 LAB — HM DIABETES EYE EXAM

## 2015-04-14 ENCOUNTER — Encounter: Payer: Self-pay | Admitting: Internal Medicine

## 2015-04-14 ENCOUNTER — Ambulatory Visit (INDEPENDENT_AMBULATORY_CARE_PROVIDER_SITE_OTHER): Payer: Medicare Other | Admitting: Internal Medicine

## 2015-04-14 VITALS — BP 126/76 | HR 65 | Temp 98.0°F | Ht 72.0 in | Wt 231.5 lb

## 2015-04-14 DIAGNOSIS — E119 Type 2 diabetes mellitus without complications: Secondary | ICD-10-CM | POA: Diagnosis not present

## 2015-04-14 DIAGNOSIS — J209 Acute bronchitis, unspecified: Secondary | ICD-10-CM

## 2015-04-14 DIAGNOSIS — I10 Essential (primary) hypertension: Secondary | ICD-10-CM | POA: Diagnosis not present

## 2015-04-14 MED ORDER — AZITHROMYCIN 250 MG PO TABS
ORAL_TABLET | ORAL | Status: DC
Start: 2015-04-14 — End: 2015-05-18

## 2015-04-14 MED ORDER — PREDNISONE 10 MG PO TABS: ORAL_TABLET | ORAL | Status: DC

## 2015-04-14 NOTE — Assessment & Plan Note (Signed)
BG well controlled. Will check A1c with labs in 4 weeks. Continue Metformin.

## 2015-04-14 NOTE — Progress Notes (Signed)
Subjective:    Patient ID: Larry Ramus., male    DOB: 1946-07-02, 69 y.o.   MRN: [id number]  HPI  69YO male presents for follow up.  Recently recovered from sinus infection. Continues to have nasal congestion. Using Mucinex. Has "hacking cough" in the morning. Productive cough with purulent mucous continues. No chest pain. No fever, chills. No dyspnea.  DM - BG have been near 140s. Compliant with medications.  Past medical, surgical, family and social history per today's encounter.  Review of Systems  Constitutional: Positive for fatigue. Negative for fever, chills and activity change.  HENT: Positive for congestion, postnasal drip and sinus pressure. Negative for ear discharge, ear pain, hearing loss, nosebleeds, rhinorrhea, sneezing, sore throat, tinnitus, trouble swallowing and voice change.   Eyes: Negative for discharge, redness, itching and visual disturbance.  Respiratory: Positive for cough. Negative for chest tightness, shortness of breath, wheezing and stridor.   Cardiovascular: Negative for chest pain and leg swelling.  Musculoskeletal: Negative for myalgias, arthralgias, neck pain and neck stiffness.  Skin: Negative for color change and rash.  Neurological: Negative for dizziness, facial asymmetry and headaches.  Psychiatric/Behavioral: Negative for sleep disturbance.       Objective:    BP 126/76 mmHg  Pulse 65  Temp(Src) 98 F (36.7 C) (Oral)  Ht 6' (1.829 m)  Wt 231 lb 8 oz (105.008 kg)  BMI 31.39 kg/m2  SpO2 98% Physical Exam  Constitutional: He is oriented to person, place, and time. He appears well-developed and well-nourished. No distress.  HENT:  Head: Normocephalic and atraumatic.  Right Ear: External ear normal.  Left Ear: External ear normal.  Nose: Nose normal.  Mouth/Throat: Oropharynx is clear and moist. No oropharyngeal exudate.  Eyes: Conjunctivae and EOM are normal. Pupils are equal, round, and reactive to light. Right eye exhibits no  discharge. Left eye exhibits no discharge. No scleral icterus.  Neck: Normal range of motion. Neck supple. No tracheal deviation present. No thyromegaly present.  Cardiovascular: Normal rate, regular rhythm and normal heart sounds.  Exam reveals no gallop and no friction rub.   No murmur heard. Pulmonary/Chest: Effort normal. No accessory muscle usage. No tachypnea. No respiratory distress. He has no decreased breath sounds. He has no wheezes. He has rhonchi (scattered). He has no rales. He exhibits no tenderness.  Musculoskeletal: Normal range of motion. He exhibits no edema.  Lymphadenopathy:    He has no cervical adenopathy.  Neurological: He is alert and oriented to person, place, and time. No cranial nerve deficit. Coordination normal.  Skin: Skin is warm and dry. No rash noted. He is not diaphoretic. No erythema. No pallor.  Psychiatric: He has a normal mood and affect. His behavior is normal. Judgment and thought content normal.          Assessment & Plan:   Problem List Items Addressed This Visit      Unprioritized   Acute bronchitis    Symptoms and exam c/w acute bronchitis. Will start Azithromycin and Prednisone taper. Continue Mucinex. Follow up in 4 weeks and prn.      Relevant Medications   predniSONE (DELTASONE) 10 MG tablet   Diabetes mellitus type 2, controlled - Primary    BG well controlled. Will check A1c with labs in 4 weeks. Continue Metformin.      Relevant Orders   Comprehensive metabolic panel   Hemoglobin A1c   Lipid panel   Microalbumin / creatinine urine ratio   Hypertension    BP  Readings from Last 3 Encounters:  04/14/15 126/76  02/12/15 138/78  11/18/14 142/76   BP well controlled. Continue current medications. Renal function with labs next visit.          Return in about 4 weeks (around 05/12/2015) for Recheck of Diabetes.

## 2015-04-14 NOTE — Assessment & Plan Note (Signed)
BP Readings from Last 3 Encounters:  04/14/15 126/76  02/12/15 138/78  11/18/14 142/76   BP well controlled. Continue current medications. Renal function with labs next visit.

## 2015-04-14 NOTE — Progress Notes (Signed)
Pre visit review using our clinic review tool, if applicable. No additional management support is needed unless otherwise documented below in the visit note. 

## 2015-04-14 NOTE — Patient Instructions (Signed)
Start Azithromycin and Prednisone taper. Continue Mucinex.  Labs in 4 weeks prior to follow up visit.

## 2015-04-14 NOTE — Assessment & Plan Note (Signed)
Symptoms and exam c/w acute bronchitis. Will start Azithromycin and Prednisone taper. Continue Mucinex. Follow up in 4 weeks and prn.

## 2015-04-15 ENCOUNTER — Encounter: Payer: Self-pay | Admitting: *Deleted

## 2015-04-22 ENCOUNTER — Other Ambulatory Visit: Payer: Self-pay | Admitting: Internal Medicine

## 2015-05-14 ENCOUNTER — Other Ambulatory Visit (INDEPENDENT_AMBULATORY_CARE_PROVIDER_SITE_OTHER): Payer: Medicare Other

## 2015-05-14 DIAGNOSIS — E119 Type 2 diabetes mellitus without complications: Secondary | ICD-10-CM

## 2015-05-14 LAB — LIPID PANEL
CHOL/HDL RATIO: 3
Cholesterol: 128 mg/dL (ref 0–200)
HDL: 37.9 mg/dL — ABNORMAL LOW (ref >39.00)
LDL Cholesterol: 61 mg/dL (ref 0–99)
NonHDL: 90.1
Triglycerides: 144 mg/dL (ref 0.0–149.0)
VLDL: 28.8 mg/dL (ref 0.0–40.0)

## 2015-05-14 LAB — MICROALBUMIN / CREATININE URINE RATIO
Creatinine,U: 84.8 mg/dL
MICROALB/CREAT RATIO: 2.9 mg/g (ref 0.0–30.0)
Microalb, Ur: 2.5 mg/dL — ABNORMAL HIGH (ref 0.0–1.9)

## 2015-05-14 LAB — COMPREHENSIVE METABOLIC PANEL
ALBUMIN: 4.4 g/dL (ref 3.5–5.2)
ALT: 25 U/L (ref 0–53)
AST: 22 U/L (ref 0–37)
Alkaline Phosphatase: 67 U/L (ref 39–117)
BUN: 14 mg/dL (ref 6–23)
CHLORIDE: 102 meq/L (ref 96–112)
CO2: 30 mEq/L (ref 19–32)
Calcium: 9.6 mg/dL (ref 8.4–10.5)
Creatinine, Ser: 0.79 mg/dL (ref 0.40–1.50)
GFR: 103.71 mL/min (ref >60.00)
Glucose, Bld: 141 mg/dL — ABNORMAL HIGH (ref 70–99)
Potassium: 4.5 mEq/L (ref 3.5–5.1)
Sodium: 137 mEq/L (ref 135–145)
Total Bilirubin: 0.8 mg/dL (ref 0.2–1.2)
Total Protein: 6.9 g/dL (ref 6.0–8.3)

## 2015-05-14 LAB — HEMOGLOBIN A1C: HEMOGLOBIN A1C: 6.3 % (ref 4.6–6.5)

## 2015-05-18 ENCOUNTER — Ambulatory Visit (INDEPENDENT_AMBULATORY_CARE_PROVIDER_SITE_OTHER): Payer: Medicare Other | Admitting: Internal Medicine

## 2015-05-18 ENCOUNTER — Encounter: Payer: Self-pay | Admitting: Internal Medicine

## 2015-05-18 VITALS — BP 129/78 | HR 77 | Temp 98.1°F | Ht 72.0 in | Wt 232.5 lb

## 2015-05-18 DIAGNOSIS — G4700 Insomnia, unspecified: Secondary | ICD-10-CM

## 2015-05-18 DIAGNOSIS — E119 Type 2 diabetes mellitus without complications: Secondary | ICD-10-CM | POA: Diagnosis not present

## 2015-05-18 DIAGNOSIS — I10 Essential (primary) hypertension: Secondary | ICD-10-CM | POA: Diagnosis not present

## 2015-05-18 LAB — HM DIABETES FOOT EXAM: HM Diabetic Foot Exam: NORMAL

## 2015-05-18 MED ORDER — ZOLPIDEM TARTRATE 5 MG PO TABS: 5.0000 mg | ORAL_TABLET | Freq: Every evening | ORAL | Status: DC | PRN

## 2015-05-18 NOTE — Assessment & Plan Note (Addendum)
Excellent control of BG. Will continue Metformin. Follow up in 6 months. Note no statin medication as LDL 60 without medications.

## 2015-05-18 NOTE — Assessment & Plan Note (Signed)
Symptoms well controlled with Ambien. Will continue. 

## 2015-05-18 NOTE — Assessment & Plan Note (Signed)
BP Readings from Last 3 Encounters:  05/18/15 129/78  04/14/15 126/76  02/12/15 138/78   BP well controlled. Renal function normal. Continue current medications.

## 2015-05-18 NOTE — Patient Instructions (Signed)
Labs and follow up in 6 months

## 2015-05-18 NOTE — Progress Notes (Signed)
   Subjective:    Patient ID: Larry Ramus., male    DOB: September 27, 1946, 69 y.o.   MRN: [id number]  HPI  69YO male presents for follow up.  Feeling well. No concerns today. Fasting BG near 80-100. Compliant with medication. No side effects noted. Trying to follow a healthy diet. Exercising 296min per week.  Tapered Ambien to 5mg  daily at bedtime. Symptoms well controlled with this dosing. No daytime fatigue.  Past medical, surgical, family and social history per today's encounter.  Review of Systems  Constitutional: Negative for fever, chills, activity change, appetite change, fatigue and unexpected weight change.  Eyes: Negative for visual disturbance.  Respiratory: Negative for cough and shortness of breath.   Cardiovascular: Negative for chest pain, palpitations and leg swelling.  Gastrointestinal: Negative for nausea, vomiting, abdominal pain, diarrhea, constipation and abdominal distention.  Genitourinary: Negative for dysuria, urgency and difficulty urinating.  Musculoskeletal: Negative for myalgias, arthralgias and gait problem.  Skin: Negative for color change and rash.  Hematological: Negative for adenopathy.  Psychiatric/Behavioral: Negative for sleep disturbance and dysphoric mood. The patient is not nervous/anxious.        Objective:    BP 129/78 mmHg  Pulse 77  Temp(Src) 98.1 F (36.7 C) (Oral)  Ht 6' (1.829 m)  Wt 232 lb 8 oz (105.461 kg)  BMI 31.53 kg/m2  SpO2 98% Physical Exam  Constitutional: He is oriented to person, place, and time. He appears well-developed and well-nourished. No distress.  HENT:  Head: Normocephalic and atraumatic.  Right Ear: External ear normal.  Left Ear: External ear normal.  Nose: Nose normal.  Mouth/Throat: Oropharynx is clear and moist. No oropharyngeal exudate.  Eyes: Conjunctivae and EOM are normal. Pupils are equal, round, and reactive to light. Right eye exhibits no discharge. Left eye exhibits no discharge. No scleral  icterus.  Neck: Normal range of motion. Neck supple. No tracheal deviation present. No thyromegaly present.  Cardiovascular: Normal rate, regular rhythm and normal heart sounds.  Exam reveals no gallop and no friction rub.   No murmur heard. Pulmonary/Chest: Effort normal and breath sounds normal. No accessory muscle usage. No tachypnea. No respiratory distress. He has no decreased breath sounds. He has no wheezes. He has no rhonchi. He has no rales. He exhibits no tenderness.  Musculoskeletal: Normal range of motion. He exhibits no edema.  Lymphadenopathy:    He has no cervical adenopathy.  Neurological: He is alert and oriented to person, place, and time. No cranial nerve deficit. Coordination normal.  Skin: Skin is warm and dry. No rash noted. He is not diaphoretic. No erythema. No pallor.  Psychiatric: He has a normal mood and affect. His behavior is normal. Judgment and thought content normal.          Assessment & Plan:   Problem List Items Addressed This Visit      Unprioritized   Diabetes mellitus type 2, controlled - Primary    Excellent control of BG. Will continue Metformin. Follow up in 6 months. Note no statin medication as LDL 60 without medications.      Hypertension    BP Readings from Last 3 Encounters:  05/18/15 129/78  04/14/15 126/76  02/12/15 138/78   BP well controlled. Renal function normal. Continue current medications.      Insomnia    Symptoms well controlled with Ambien. Will continue.          Return in about 6 months (around 11/17/2015) for Recheck of Diabetes.

## 2015-05-18 NOTE — Progress Notes (Signed)
Pre visit review using our clinic review tool, if applicable. No additional management support is needed unless otherwise documented below in the visit note. 

## 2015-06-17 ENCOUNTER — Other Ambulatory Visit: Payer: Self-pay | Admitting: Internal Medicine

## 2015-06-18 ENCOUNTER — Encounter: Payer: Self-pay | Admitting: Nurse Practitioner

## 2015-06-18 ENCOUNTER — Ambulatory Visit (INDEPENDENT_AMBULATORY_CARE_PROVIDER_SITE_OTHER): Payer: Medicare Other | Admitting: Nurse Practitioner

## 2015-06-18 VITALS — BP 132/82 | HR 71 | Temp 99.4°F | Resp 18 | Ht 72.0 in | Wt 230.4 lb

## 2015-06-18 DIAGNOSIS — J329 Chronic sinusitis, unspecified: Secondary | ICD-10-CM | POA: Diagnosis not present

## 2015-06-18 MED ORDER — SULFAMETHOXAZOLE-TRIMETHOPRIM 800-160 MG PO TABS: 1.0000 | ORAL_TABLET | Freq: Two times a day (BID) | ORAL | Status: DC

## 2015-06-18 MED ORDER — METHYLPREDNISOLONE 4 MG PO TABS: ORAL_TABLET | ORAL | Status: DC

## 2015-06-18 NOTE — Assessment & Plan Note (Addendum)
Pt reports ENT gave him Bactrim-DS last time and was helpful. Will also send in medrol dose pack. FU prn worsening/failure to improve.

## 2015-06-18 NOTE — Progress Notes (Signed)
   Subjective:    Patient ID: Larry Ramus., male    DOB: 1946-06-25, 69 y.o.   MRN: [id number]  HPI  Larry Roman is a 69 yo male with a CC sinusitis/bronchitis x 2 days.   1) Pain, pressure, drainage, tightness, congestion in chest, cough yellow/green. Right side of face tender, pndrip, ear feels full on right.   Tylenol- Helpful  Mucinex- Not helpful   Review of Systems  Constitutional: Negative for fever, chills, diaphoresis and fatigue.  HENT: Positive for ear pain, postnasal drip, rhinorrhea and sinus pressure. Negative for congestion, ear discharge, sneezing, sore throat, tinnitus, trouble swallowing and voice change.   Eyes: Negative for visual disturbance.  Respiratory: Positive for cough. Negative for chest tightness, shortness of breath and wheezing.   Cardiovascular: Negative for chest pain, palpitations and leg swelling.  Gastrointestinal: Negative for nausea, vomiting and diarrhea.  Skin: Negative for rash.  Neurological: Positive for headaches. Negative for dizziness.      Objective:   Physical Exam  Constitutional: He is oriented to person, place, and time. He appears well-developed and well-nourished. No distress.  BP 132/82 mmHg  Pulse 71  Temp(Src) 99.4 F (37.4 C)  Resp 18  Ht 6' (1.829 m)  Wt 230 lb 6.4 oz (104.509 kg)  BMI 31.24 kg/m2  SpO2 97%   HENT:  Head: Normocephalic and atraumatic.  Right Ear: External ear normal.  Left Ear: External ear normal.  Mouth/Throat: Oropharynx is clear and moist.  TM's clear bilaterally  Red oropharynx with clear drainage  Eyes: Conjunctivae and EOM are normal. Pupils are equal, round, and reactive to light. Right eye exhibits no discharge. Left eye exhibits no discharge. No scleral icterus.  Neck: Normal range of motion. Neck supple. No thyromegaly present.  Cardiovascular: Normal rate and regular rhythm.   Pulmonary/Chest: Effort normal and breath sounds normal. No respiratory distress. He has no wheezes. He has  no rales. He exhibits no tenderness.  Lymphadenopathy:    He has no cervical adenopathy.  Neurological: He is alert and oriented to person, place, and time.  Skin: Skin is warm and dry. No rash noted. He is not diaphoretic.  Psychiatric: He has a normal mood and affect. His behavior is normal. Judgment and thought content normal.      Assessment & Plan:

## 2015-06-18 NOTE — Patient Instructions (Signed)
Prednisone with breakfast  6 tablets on day 1, 5 tablets on day 2, 4 tablets on day 3, 3 tablets on day 4, 2 tablets day 5, 1 tablet on day 6...done!   Continue Mucinex.

## 2015-09-08 ENCOUNTER — Encounter: Payer: Self-pay | Admitting: Internal Medicine

## 2015-09-14 ENCOUNTER — Ambulatory Visit (INDEPENDENT_AMBULATORY_CARE_PROVIDER_SITE_OTHER)
Admission: RE | Admit: 2015-09-14 | Discharge: 2015-09-14 | Disposition: A | Discharge status: Home / Self Care | Payer: Medicare Other | Source: Ambulatory Visit | Attending: Internal Medicine | Admitting: Internal Medicine

## 2015-09-14 ENCOUNTER — Ambulatory Visit (INDEPENDENT_AMBULATORY_CARE_PROVIDER_SITE_OTHER): Payer: Medicare Other | Admitting: Internal Medicine

## 2015-09-14 ENCOUNTER — Encounter: Payer: Self-pay | Admitting: Internal Medicine

## 2015-09-14 VITALS — BP 136/78 | HR 69 | Temp 98.0°F | Ht 72.0 in | Wt 228.4 lb

## 2015-09-14 DIAGNOSIS — M5431 Sciatica, right side: Secondary | ICD-10-CM | POA: Diagnosis not present

## 2015-09-14 DIAGNOSIS — G8929 Other chronic pain: Secondary | ICD-10-CM | POA: Insufficient documentation

## 2015-09-14 DIAGNOSIS — M545 Low back pain: Secondary | ICD-10-CM | POA: Diagnosis not present

## 2015-09-14 DIAGNOSIS — M179 Osteoarthritis of knee, unspecified: Secondary | ICD-10-CM | POA: Diagnosis not present

## 2015-09-14 DIAGNOSIS — Z23 Encounter for immunization: Secondary | ICD-10-CM | POA: Diagnosis not present

## 2015-09-14 DIAGNOSIS — M5430 Sciatica, unspecified side: Secondary | ICD-10-CM | POA: Insufficient documentation

## 2015-09-14 DIAGNOSIS — M25561 Pain in right knee: Secondary | ICD-10-CM

## 2015-09-14 DIAGNOSIS — M25562 Pain in left knee: Secondary | ICD-10-CM

## 2015-09-14 MED ORDER — PREDNISONE 10 MG PO TABS: ORAL_TABLET | ORAL | Status: DC

## 2015-09-14 MED ORDER — HYDROCODONE-ACETAMINOPHEN 5-325 MG PO TABS: 1.0000 | ORAL_TABLET | Freq: Three times a day (TID) | ORAL | Status: DC | PRN

## 2015-09-14 NOTE — Addendum Note (Signed)
Addended by: Ronette Deter A on: 09/14/2015 09:01 AM   Modules accepted: Orders

## 2015-09-14 NOTE — Progress Notes (Signed)
Subjective:    Patient ID: Larry Roman., male    DOB: January 28, 1946, 69 y.o.   MRN: [id number]  HPI  69YO male presents for acute visit  Back pain - Started 3 weeks ago. Has h/o lumbar back surgery in past with discectomy.  Sharp pain starts in posterior right hip and radiates down right posterior leg. Sometimes feels that leg will give out on him. No numbness. Worse with increased physical activity. Taking Hydrocodone with some improvement. However this causes constipation. 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 Followed by chiropractor for right leg pain.  Wt Readings from Last 3 Encounters:  09/14/15 228 lb 6 oz (103.59 kg)  06/18/15 230 lb 6.4 oz (104.509 kg)  05/18/15 232 lb 8 oz (105.461 kg)   BP Readings from Last 3 Encounters:  09/14/15 136/78  06/18/15 132/82  05/18/15 129/78    Past Medical History  Diagnosis Date  . HTN (hypertension)   . DM (diabetes mellitus) ([place])   . Depression   . Other allergy, other than to medicinal agents   . Extrinsic asthma, unspecified     childhood   Family History  Problem Relation Age of Onset  . Heart failure Father   . Heart attack Mother    Past Surgical History  Procedure Laterality Date  . Tonsillectomy    . Back surgery    . Cataract extraction, bilateral  2016    Dr. Kerman Passey at [place]  . Marital Status: Married    Spouse Name: N/A  . Number of Children: 2  . Years of Education: N/A   Occupational History  .     Social History Main Topics  . Smoking status: Never Smoker   . Smokeless tobacco: Never Used     Comment: tobacco use - no  . Alcohol Use: Yes  . Drug Use: No  . Sexual Activity: Not Asked   Other Topics Concern  . None   Social History Narrative    Review of Systems  Constitutional: Negative for fever, chills, activity change, appetite change, fatigue and unexpected weight change.  Eyes: Negative for visual disturbance.    Respiratory: Negative for cough and shortness of breath.   Cardiovascular: Negative for chest pain, palpitations and leg swelling.  Gastrointestinal: Positive for constipation. Negative for nausea, vomiting, abdominal pain, diarrhea and abdominal distention.  Genitourinary: Negative for dysuria, urgency and difficulty urinating.  Musculoskeletal: Positive for myalgias, back pain and arthralgias. Negative for gait problem.  Skin: Negative for color change and rash.  Neurological: Positive for weakness. Negative for numbness.  Hematological: Negative for adenopathy.  Psychiatric/Behavioral: Negative for suicidal ideas, sleep disturbance and dysphoric mood. The patient is not nervous/anxious.        Objective:    BP 136/78 mmHg  Pulse 69  Temp(Src) 98 F (36.7 C) (Oral)  Ht 6' (1.829 m)  Wt 228 lb 6 oz (103.59 kg)  BMI 30.97 kg/m2  SpO2 96% Physical Exam  Constitutional: He is oriented to person, place, and time. He appears well-developed and well-nourished. No distress.  HENT:  Head: Normocephalic and atraumatic.  Right Ear: External ear normal.  Left Ear: External ear normal.  Nose: Nose normal.  Mouth/Throat: Oropharynx is clear and moist. No oropharyngeal exudate.  Eyes: Conjunctivae and EOM are normal. Pupils are equal, round, and reactive to light. Right eye exhibits no discharge. Left eye exhibits no discharge. No scleral icterus.  Neck:  Normal range of motion. Neck supple. No tracheal deviation present. No thyromegaly present.  Cardiovascular: Normal rate, regular rhythm and normal heart sounds.  Exam reveals no gallop and no friction rub.   No murmur heard. Pulmonary/Chest: Effort normal and breath sounds normal. No accessory muscle usage. No tachypnea. No respiratory distress. He has no decreased breath sounds. He has no wheezes. He has no rhonchi. He has no rales. He exhibits no tenderness.  Musculoskeletal: Normal range of motion. He exhibits no edema.       Lumbar  back: He exhibits tenderness and pain. He exhibits normal range of motion and no bony tenderness.  Atrophy right calf noted  Lymphadenopathy:    He has no cervical adenopathy.  Neurological: He is alert and oriented to person, place, and time. No cranial nerve deficit. Coordination normal.  Skin: Skin is warm and dry. No rash noted. He is not diaphoretic. No erythema. No pallor.  Psychiatric: He has a normal mood and affect. His behavior is normal. Judgment and thought content normal.          Assessment & Plan:   Problem List Items Addressed This Visit      Unprioritized   Back pain   Relevant Medications   predniSONE (DELTASONE) 10 MG tablet   HYDROcodone-acetaminophen (NORCO/VICODIN) 5-325 MG tablet   Right knee pain - Primary    Right knee pain medially, with crepitus noted on exam. Likely OA and question medial meniscal tear. Will get plain xray. Continue prn Hydrocodone for severe pain.      Relevant Orders   DG Knee Complete 4 Views Right   Sciatic pain    Right sciatica, acute on chronic pain. Will start Prednisone taper. Continue prn Hydrocodone. Follow up recheck next week and prn.      Relevant Medications   predniSONE (DELTASONE) 10 MG tablet       Return in about 1 week (around 09/21/2015) for Recheck.

## 2015-09-14 NOTE — Assessment & Plan Note (Signed)
Right knee pain medially, with crepitus noted on exam. Likely OA and question medial meniscal tear. Will get plain xray. Continue prn Hydrocodone for severe pain.

## 2015-09-14 NOTE — Assessment & Plan Note (Signed)
Right sciatica, acute on chronic pain. Will start Prednisone taper. Continue prn Hydrocodone. Follow up recheck next week and prn.

## 2015-09-14 NOTE — Patient Instructions (Signed)
Start Prednisone taper.  Xray today of right knee.  Follow up next week.

## 2015-09-21 ENCOUNTER — Ambulatory Visit: Payer: Medicare Other | Admitting: Internal Medicine

## 2015-09-23 ENCOUNTER — Encounter: Payer: Self-pay | Admitting: Internal Medicine

## 2015-09-23 ENCOUNTER — Ambulatory Visit
Admission: RE | Admit: 2015-09-23 | Discharge: 2015-09-23 | Disposition: A | Discharge status: Home / Self Care | Payer: Medicare Other | Source: Ambulatory Visit | Attending: Internal Medicine | Admitting: Internal Medicine

## 2015-09-23 DIAGNOSIS — S83241A Other tear of medial meniscus, current injury, right knee, initial encounter: Secondary | ICD-10-CM | POA: Insufficient documentation

## 2015-09-23 DIAGNOSIS — S83209A Unspecified tear of unspecified meniscus, current injury, unspecified knee, initial encounter: Secondary | ICD-10-CM

## 2015-09-23 DIAGNOSIS — M179 Osteoarthritis of knee, unspecified: Secondary | ICD-10-CM | POA: Diagnosis not present

## 2015-09-23 DIAGNOSIS — X58XXXA Exposure to other specified factors, initial encounter: Secondary | ICD-10-CM | POA: Insufficient documentation

## 2015-09-23 DIAGNOSIS — M25561 Pain in right knee: Secondary | ICD-10-CM | POA: Diagnosis present

## 2015-09-29 ENCOUNTER — Encounter: Payer: Self-pay | Admitting: Internal Medicine

## 2015-09-29 ENCOUNTER — Ambulatory Visit (INDEPENDENT_AMBULATORY_CARE_PROVIDER_SITE_OTHER): Payer: Medicare Other | Admitting: Internal Medicine

## 2015-09-29 VITALS — BP 138/73 | HR 68 | Temp 98.0°F | Ht 72.0 in | Wt 229.4 lb

## 2015-09-29 DIAGNOSIS — I10 Essential (primary) hypertension: Secondary | ICD-10-CM

## 2015-09-29 DIAGNOSIS — S83249A Other tear of medial meniscus, current injury, unspecified knee, initial encounter: Secondary | ICD-10-CM | POA: Insufficient documentation

## 2015-09-29 DIAGNOSIS — S83241D Other tear of medial meniscus, current injury, right knee, subsequent encounter: Secondary | ICD-10-CM | POA: Diagnosis not present

## 2015-09-29 DIAGNOSIS — M549 Dorsalgia, unspecified: Secondary | ICD-10-CM

## 2015-09-29 MED ORDER — DULOXETINE HCL 30 MG PO CPEP: 30.0000 mg | ORAL_CAPSULE | Freq: Every day | ORAL | Status: DC

## 2015-09-29 NOTE — Patient Instructions (Signed)
Continue Hydrocodone as needed for severe pain.  We will set up orthopedic evaluation.

## 2015-09-29 NOTE — Progress Notes (Signed)
Subjective:    Patient ID: Larry Roman., male    DOB: 08/29/1946, 69 y.o.   MRN: [id number]  HPI  69YO male presents for follow up.  RIght knee pain - Continues to have medial knee pain and also having some right sciatic pain. Taking Hydrocodone 1-3 times per day with improvement in pain symptoms. Prolonged inactivity, followed by activity causes pain. Going from seated to standing position increases pain. Minimal swelling noted.  HTN - Questions if he can limit use of medication. Has been taking Amlodipine, Losartan and Furosemide for years. No side effects noted.  Wt Readings from Last 3 Encounters:  09/29/15 229 lb 6 oz (104.044 kg)  09/14/15 228 lb 6 oz (103.59 kg)  06/18/15 230 lb 6.4 oz (104.509 kg)   BP Readings from Last 3 Encounters:  09/29/15 138/73  09/14/15 136/78  06/18/15 132/82    Past Medical History  Diagnosis Date  . HTN (hypertension)   . DM (diabetes mellitus) ([place])   . Depression   . Other allergy, other than to medicinal agents   . Extrinsic asthma, unspecified     childhood   Family History  Problem Relation Age of Onset  . Heart failure Father   . Heart attack Mother    Past Surgical History  Procedure Laterality Date  . Tonsillectomy    . Back surgery    . Cataract extraction, bilateral  2016    Dr. Kerman Passey at [facility]  . Marital Status: Married    Spouse Name: N/A  . Number of Children: 2  . Years of Education: N/A   Occupational History  .     Social History Main Topics  . Smoking status: Never Smoker   . Smokeless tobacco: Never Used     Comment: tobacco use - no  . Alcohol Use: Yes  . Drug Use: No  . Sexual Activity: Not Asked   Other Topics Concern  . None   Social History Narrative    Review of Systems  Constitutional: Negative for fever, chills, activity change, appetite change, fatigue and unexpected weight change.  Eyes: Negative for visual disturbance.    Respiratory: Negative for cough and shortness of breath.   Cardiovascular: Negative for chest pain, palpitations and leg swelling.  Gastrointestinal: Negative for abdominal pain and abdominal distention.  Genitourinary: Negative for dysuria, urgency and difficulty urinating.  Musculoskeletal: Positive for myalgias, arthralgias and gait problem.  Skin: Negative for color change and rash.  Hematological: Negative for adenopathy.  Psychiatric/Behavioral: Negative for sleep disturbance and dysphoric mood. The patient is not nervous/anxious.        Objective:    BP 138/73 mmHg  Pulse 68  Temp(Src) 98 F (36.7 C) (Oral)  Ht 6' (1.829 m)  Wt 229 lb 6 oz (104.044 kg)  BMI 31.10 kg/m2  SpO2 96% Physical Exam  Constitutional: He is oriented to person, place, and time. He appears well-developed and well-nourished. No distress.  HENT:  Head: Normocephalic and atraumatic.  Right Ear: External ear normal.  Left Ear: External ear normal.  Nose: Nose normal.  Mouth/Throat: Oropharynx is clear and moist.  Eyes: Conjunctivae and EOM are normal. Pupils are equal, round, and reactive to light. Right eye exhibits no discharge. Left eye exhibits no discharge. No scleral icterus.  Neck: Normal range of motion. Neck supple. No tracheal deviation present. No thyromegaly present.  Cardiovascular: Normal rate, regular rhythm and normal heart sounds.  Exam reveals no gallop and no friction rub.   No murmur heard. Pulmonary/Chest: Effort normal and breath sounds normal. No accessory muscle usage. No tachypnea. No respiratory distress. He has no decreased breath sounds. He has no wheezes. He has no rhonchi. He has no rales. He exhibits no tenderness.  Musculoskeletal: Normal range of motion. He exhibits no edema.       Right knee: He exhibits normal range of motion and no swelling. Tenderness found. Medial joint line tenderness noted.  Lymphadenopathy:    He has no cervical adenopathy.  Neurological: He is  alert and oriented to person, place, and time. No cranial nerve deficit. Coordination normal.  Skin: Skin is warm and dry. No rash noted. He is not diaphoretic. No erythema. No pallor.  Psychiatric: He has a normal mood and affect. His behavior is normal. Judgment and thought content normal.          Assessment & Plan:   Problem List Items Addressed This Visit      Unprioritized   Acute medial meniscal tear - Primary    Right medial meniscal tear. Reviewed MRI with pt. Will set up ortho evaluation. Continue prn Hydrocodone for severe pain.      Back pain    Chronic low back pain, controlled with use of Cymbalta and prn Hydrocodone. Will continue.      Relevant Medications   DULoxetine (CYMBALTA) 30 MG capsule   Hypertension    BP Readings from Last 3 Encounters:  09/29/15 138/73  09/14/15 136/78  06/18/15 132/82   BP well controlled. We discussed possibly trying to come off Furosemide. He will stop medication for a few days and monitor BP at home. Follow up 4 weeks and prn.          Return in about 4 weeks (around 10/27/2015) for Recheck.

## 2015-09-29 NOTE — Assessment & Plan Note (Signed)
Right medial meniscal tear. Reviewed MRI with pt. Will set up ortho evaluation. Continue prn Hydrocodone for severe pain.

## 2015-09-29 NOTE — Assessment & Plan Note (Signed)
Chronic low back pain, controlled with use of Cymbalta and prn Hydrocodone. Will continue.

## 2015-09-29 NOTE — Assessment & Plan Note (Signed)
BP Readings from Last 3 Encounters:  09/29/15 138/73  09/14/15 136/78  06/18/15 132/82   BP well controlled. We discussed possibly trying to come off Furosemide. He will stop medication for a few days and monitor BP at home. Follow up 4 weeks and prn.

## 2015-09-29 NOTE — Progress Notes (Signed)
Pre visit review using our clinic review tool, if applicable. No additional management support is needed unless otherwise documented below in the visit note. 

## 2015-10-06 DIAGNOSIS — M1711 Unilateral primary osteoarthritis, right knee: Secondary | ICD-10-CM | POA: Diagnosis not present

## 2015-10-15 ENCOUNTER — Ambulatory Visit (INDEPENDENT_AMBULATORY_CARE_PROVIDER_SITE_OTHER): Payer: Medicare Other | Admitting: Nurse Practitioner

## 2015-10-15 ENCOUNTER — Encounter: Payer: Self-pay | Admitting: Nurse Practitioner

## 2015-10-15 VITALS — BP 158/80 | HR 72 | Temp 98.1°F | Wt 227.0 lb

## 2015-10-15 DIAGNOSIS — J329 Chronic sinusitis, unspecified: Secondary | ICD-10-CM

## 2015-10-15 MED ORDER — METHYLPREDNISOLONE 4 MG PO TABS: ORAL_TABLET | ORAL | Status: DC

## 2015-10-15 MED ORDER — SULFAMETHOXAZOLE-TRIMETHOPRIM 800-160 MG PO TABS: 1.0000 | ORAL_TABLET | Freq: Two times a day (BID) | ORAL | Status: DC

## 2015-10-15 NOTE — Patient Instructions (Signed)
Use probiotics! Please take a probiotic ( Align, Floraque or Culturelle) while you are on the antibiotic to prevent a serious antibiotic associated diarrhea  Called clostirudium dificile colitis.   Bactrim DS twice daily x 10 days with prednisone taper.   Prednisone with breakfast or lunch at the latest.  6 tablets on day 1, 5 tablets on day 2, 4 tablets on day 3, 3 tablets on day 4, 2 tablets day 5, 1 tablet on day 6...done! Take tablets all together not spaced out Don't take with NSAIDs (Ibuprofen, Aleve, Naproxen, Meloxicam ect...)

## 2015-10-15 NOTE — Progress Notes (Signed)
Patient ID: Larry Roman., male    DOB: August 23, 1946  Age: 69 y.o. MRN: [id number]  CC: Sinusitis   HPI Larry Roman. presents for sinusitis x 1 week.  1) Saw Dr. Pryor Ochoa in March and he was prescribed prednisone taper and antibiotic.  Patient reports headache, ear pressure, scratchy throat Denies sick contacts Denies treatment to date  History Larry Roman has a past medical history of HTN (hypertension); DM (diabetes mellitus) ([place]); Depression; Other allergy, other than to medicinal agents; and Extrinsic asthma, unspecified.   He has past surgical history that includes Tonsillectomy; Back surgery; and Cataract extraction, bilateral (2016).   His family history includes Heart attack in his mother; Heart failure in his father.He reports that he has never smoked. He has never used smokeless tobacco. He reports that he drinks alcohol. He reports that he does not use illicit drugs.  Outpatient Prescriptions Prior to Visit  Medication Sig Dispense Refill  . amLODipine (NORVASC) 10 MG tablet TAKE 1 TABLET BY MOUTH EVERY DAY 90 tablet 2  . aspirin EC 81 MG tablet Take 81 mg by mouth daily.      Marland Kitchen azelastine (ASTELIN) 0.1 % nasal spray USE 1 SPRAY IN EACH NOSTRIL TWICE DAILY AS DIRECTED 30 mL 2  . Cholecalciferol (VITAMIN D3) 2000 UNITS TABS Take by mouth.    . Cyanocobalamin (B-12) 5000 MCG SUBL Place under the tongue.    . cyclobenzaprine (FLEXERIL) 10 MG tablet Take 1 tablet (10 mg total) by mouth 3 (three) times daily as needed. 90 tablet 3  . DULoxetine (CYMBALTA) 30 MG capsule Take 1 capsule (30 mg total) by mouth daily. 90 capsule 3  . furosemide (LASIX) 20 MG tablet TAKE 1 TABLET EVERY DAY (Patient not taking: Reported on 10/15/2015) 90 tablet 3  . glucose blood (ONE TOUCH ULTRA TEST) test strip Check sugar once daily. Dx E11.9 100 each 1  . HYDROcodone-acetaminophen (NORCO/VICODIN) 5-325 MG tablet Take 1-2 tablets by mouth 3 (three) times daily as needed for moderate pain. 90 tablet 0   . loratadine (CLARITIN) 10 MG tablet Take 10 mg by mouth daily.    Marland Kitchen losartan (COZAAR) 100 MG tablet Take 1 tablet (100 mg total) by mouth daily. 90 tablet 3  . metFORMIN (GLUCOPHAGE) 500 MG tablet TAKE 1 TABLET (500 MG TOTAL) BY MOUTH 2 (TWO) TIMES DAILY WITH A MEAL. 180 tablet 1  . ranitidine (ZANTAC) 75 MG tablet Take 75 mg by mouth daily as needed for heartburn.    . zolpidem (AMBIEN) 5 MG tablet Take 1 tablet (5 mg total) by mouth at bedtime as needed. 90 tablet 1  . predniSONE (DELTASONE) 10 MG tablet Take 60mg  by mouth on day 1, then taper by 10mg  daily until gone (Patient not taking: Reported on 10/15/2015) 21 tablet 0   No facility-administered medications prior to visit.    ROS Review of Systems  Constitutional: Negative for fever, chills, diaphoresis and fatigue.  HENT: Positive for congestion, ear pain, rhinorrhea, sinus pressure and sore throat. Negative for ear discharge and sneezing.   Eyes: Negative for visual disturbance.  Respiratory: Negative for chest tightness, shortness of breath and wheezing.   Cardiovascular: Negative for chest pain, palpitations and leg swelling.  Gastrointestinal: Negative for nausea, vomiting and diarrhea.  Endocrine: Negative for polydipsia, polyphagia and polyuria.  Neurological: Positive for headaches. Negative for dizziness.    Objective:  BP 158/80 mmHg  Pulse 72  Temp(Src) 98.1 F (36.7 C) (Oral)  Wt 227 lb (102.967  kg)  SpO2 95%  Physical Exam  Constitutional: He is oriented to person, place, and time. He appears well-developed and well-nourished. No distress.  HENT:  Head: Normocephalic and atraumatic.  Right Ear: External ear normal.  Left Ear: External ear normal.  TMs clear bilaterally  Eyes: EOM are normal. Pupils are equal, round, and reactive to light. Right eye exhibits no discharge. Left eye exhibits no discharge. No scleral icterus.  Cardiovascular: Normal rate, regular rhythm and normal heart sounds.  Exam reveals  no gallop and no friction rub.   No murmur heard. Pulmonary/Chest: Effort normal and breath sounds normal. No respiratory distress. He has no wheezes. He has no rales. He exhibits no tenderness.  Neurological: He is alert and oriented to person, place, and time.  Skin: Skin is warm and dry. No rash noted. He is not diaphoretic.  Psychiatric: He has a normal mood and affect. His behavior is normal. Judgment and thought content normal.      Assessment & Plan:   [name] was seen today for sinusitis.  Diagnoses and all orders for this visit:  Recurrent sinus infections  Other orders -     sulfamethoxazole-trimethoprim (BACTRIM DS,SEPTRA DS) 800-160 MG tablet; Take 1 tablet by mouth 2 (two) times daily. -     methylPREDNISolone (MEDROL) 4 MG tablet; Take 6 tablets by mouth with breakfast or lunch and decrease by 1 tablet each day until gone.  I have discontinued Larry Roman predniSONE. I am also having him start on sulfamethoxazole-trimethoprim and methylPREDNISolone. Additionally, I am having him maintain his aspirin EC, ranitidine, loratadine, Vitamin D3, B-12, cyclobenzaprine, losartan, amLODipine, furosemide, azelastine, glucose blood, zolpidem, metFORMIN, HYDROcodone-acetaminophen, and DULoxetine.  Meds ordered this encounter  Medications  . sulfamethoxazole-trimethoprim (BACTRIM DS,SEPTRA DS) 800-160 MG tablet    Sig: Take 1 tablet by mouth 2 (two) times daily.    Dispense:  20 tablet    Refill:  0    Order Specific Question:  Supervising Provider    Answer:  Deborra Medina L [[id number]]  . methylPREDNISolone (MEDROL) 4 MG tablet    Sig: Take 6 tablets by mouth with breakfast or lunch and decrease by 1 tablet each day until gone.    Dispense:  21 tablet    Refill:  0    Order Specific Question:  Supervising Provider    Answer:  Crecencio Mc [[id number]]     Follow-up: Return if symptoms worsen or fail to improve.

## 2015-10-21 NOTE — Assessment & Plan Note (Addendum)
Bactrim DS and prednisone taper given for care. Advised follow up with ENT if recurs. Probiotics encouraged. FU prn worsening/failure to improve.

## 2015-11-11 ENCOUNTER — Ambulatory Visit (INDEPENDENT_AMBULATORY_CARE_PROVIDER_SITE_OTHER): Payer: Medicare Other | Admitting: Nurse Practitioner

## 2015-11-11 ENCOUNTER — Encounter: Payer: Self-pay | Admitting: Nurse Practitioner

## 2015-11-11 VITALS — BP 130/76 | HR 68 | Temp 98.3°F | Resp 18 | Ht 72.0 in | Wt 226.6 lb

## 2015-11-11 DIAGNOSIS — J329 Chronic sinusitis, unspecified: Secondary | ICD-10-CM | POA: Diagnosis not present

## 2015-11-11 MED ORDER — SULFAMETHOXAZOLE-TRIMETHOPRIM 800-160 MG PO TABS: 1.0000 | ORAL_TABLET | Freq: Two times a day (BID) | ORAL | Status: DC

## 2015-11-11 MED ORDER — METHYLPREDNISOLONE 4 MG PO TABS: ORAL_TABLET | ORAL | Status: DC

## 2015-11-11 NOTE — Progress Notes (Signed)
Pre visit review using our clinic review tool, if applicable. No additional management support is needed unless otherwise documented below in the visit note. 

## 2015-11-11 NOTE — Patient Instructions (Signed)
Please take a probiotic ( Align, Floraque or Culturelle) while you are on the antibiotic to prevent a serious antibiotic associated diarrhea  Called clostirudium dificile colitis.   Prednisone with breakfast or lunch at the latest.  6 tablets on day 1, 5 tablets on day 2, 4 tablets on day 3, 3 tablets on day 4, 2 tablets day 5, 1 tablet on day 6...done! Take tablets all together not spaced out Don't take with NSAIDs (Ibuprofen, Aleve, Naproxen, Meloxicam ect...)  Continue to see ENT on the 16th to find out why these happen 6-7 x a year.

## 2015-11-11 NOTE — Assessment & Plan Note (Signed)
Same symptoms as last few visits. He does have an ENT visit on the 16 th and I encouraged him to keep this. Bactrim DS, Prednisone taper, encouraged probiotics.   FU prn worsening/failure to improve.

## 2015-11-11 NOTE — Progress Notes (Signed)
Patient ID: Larry Roman., male    DOB: 1946/10/26  Age: 69 y.o. MRN: [id number]  CC: Sinusitis   HPI Larry Roman. presents for CC of sinusitis x 2 days.   1) Ear pain bilaterally, PNDrip, sinus pressure. Pt has had multiple recurrences and wants the same regimen Larry Roman has had for the last few times. Larry Roman has had 6-7 episodes in the last year of this. Larry Roman has seen ENT in the past and his next appointment is on the 16th.   History Larry Roman has a past medical history of HTN (hypertension); DM (diabetes mellitus) ([place]); Depression; Other allergy, other than to medicinal agents; and Extrinsic asthma, unspecified.   Larry Roman has past surgical history that includes Tonsillectomy; Back surgery; and Cataract extraction, bilateral (2016).   His family history includes Heart attack in his mother; Heart failure in his father.Larry Roman reports that Larry Roman has never smoked. Larry Roman has never used smokeless tobacco. Larry Roman reports that Larry Roman drinks alcohol. Larry Roman reports that Larry Roman does not use illicit drugs.  Outpatient Prescriptions Prior to Visit  Medication Sig Dispense Refill  . amLODipine (NORVASC) 10 MG tablet TAKE 1 TABLET BY MOUTH EVERY DAY 90 tablet 2  . aspirin EC 81 MG tablet Take 81 mg by mouth daily.      Marland Kitchen azelastine (ASTELIN) 0.1 % nasal spray USE 1 SPRAY IN EACH NOSTRIL TWICE DAILY AS DIRECTED 30 mL 2  . Cholecalciferol (VITAMIN D3) 2000 UNITS TABS Take by mouth.    . Cyanocobalamin (B-12) 5000 MCG SUBL Place under the tongue.    . cyclobenzaprine (FLEXERIL) 10 MG tablet Take 1 tablet (10 mg total) by mouth 3 (three) times daily as needed. 90 tablet 3  . DULoxetine (CYMBALTA) 30 MG capsule Take 1 capsule (30 mg total) by mouth daily. 90 capsule 3  . furosemide (LASIX) 20 MG tablet TAKE 1 TABLET EVERY DAY (Patient not taking: Reported on 10/15/2015) 90 tablet 3  . glucose blood (ONE TOUCH ULTRA TEST) test strip Check sugar once daily. Dx E11.9 100 each 1  . HYDROcodone-acetaminophen (NORCO/VICODIN) 5-325 MG tablet Take 1-2  tablets by mouth 3 (three) times daily as needed for moderate pain. 90 tablet 0  . loratadine (CLARITIN) 10 MG tablet Take 10 mg by mouth daily.    Marland Kitchen losartan (COZAAR) 100 MG tablet Take 1 tablet (100 mg total) by mouth daily. 90 tablet 3  . metFORMIN (GLUCOPHAGE) 500 MG tablet TAKE 1 TABLET (500 MG TOTAL) BY MOUTH 2 (TWO) TIMES DAILY WITH A MEAL. 180 tablet 1  . ranitidine (ZANTAC) 75 MG tablet Take 75 mg by mouth daily as needed for heartburn.    . zolpidem (AMBIEN) 5 MG tablet Take 1 tablet (5 mg total) by mouth at bedtime as needed. 90 tablet 1  . methylPREDNISolone (MEDROL) 4 MG tablet Take 6 tablets by mouth with breakfast or lunch and decrease by 1 tablet each day until gone. 21 tablet 0  . sulfamethoxazole-trimethoprim (BACTRIM DS,SEPTRA DS) 800-160 MG tablet Take 1 tablet by mouth 2 (two) times daily. 20 tablet 0   No facility-administered medications prior to visit.    ROS Review of Systems  Constitutional: Negative for fever, chills, diaphoresis and fatigue.  HENT: Positive for congestion, ear pain, postnasal drip, rhinorrhea and sore throat. Negative for ear discharge, sneezing and trouble swallowing.   Respiratory: Negative for chest tightness, shortness of breath and wheezing.   Cardiovascular: Negative for chest pain, palpitations and leg swelling.  Gastrointestinal: Negative for nausea, vomiting  and diarrhea.  Skin: Negative for rash.  Neurological: Negative for dizziness and numbness.    Objective:  BP 130/76 mmHg  Pulse 68  Temp(Src) 98.3 F (36.8 C)  Resp 18  Ht 6' (1.829 m)  Wt 226 lb 9.6 oz (102.785 kg)  BMI 30.73 kg/m2  SpO2 93%  Physical Exam  Constitutional: Larry Roman is oriented to person, place, and time. Larry Roman appears well-developed and well-nourished. No distress.  HENT:  Head: Normocephalic and atraumatic.  Right Ear: External ear normal.  Left Ear: External ear normal.  Eyes: EOM are normal. Pupils are equal, round, and reactive to light. Right eye exhibits  no discharge. Left eye exhibits no discharge. No scleral icterus.  Cardiovascular: Normal rate, regular rhythm and normal heart sounds.   Pulmonary/Chest: Effort normal and breath sounds normal. No respiratory distress. Larry Roman has no wheezes. Larry Roman has no rales. Larry Roman exhibits no tenderness.  Neurological: Larry Roman is alert and oriented to person, place, and time.  Skin: Skin is warm and dry. No rash noted. Larry Roman is not diaphoretic.  Psychiatric: Larry Roman has a normal mood and affect. His behavior is normal. Judgment and thought content normal.   Assessment & Plan:   [name] was seen today for sinusitis.  Diagnoses and all orders for this visit:  Recurrent sinus infections  Other orders -     sulfamethoxazole-trimethoprim (BACTRIM DS,SEPTRA DS) 800-160 MG tablet; Take 1 tablet by mouth 2 (two) times daily. -     methylPREDNISolone (MEDROL) 4 MG tablet; Take 6 tablets by mouth with breakfast or lunch and decrease by 1 tablet each day until gone.  I am having Larry Roman maintain his aspirin EC, ranitidine, loratadine, Vitamin D3, B-12, cyclobenzaprine, losartan, amLODipine, furosemide, azelastine, glucose blood, zolpidem, metFORMIN, HYDROcodone-acetaminophen, DULoxetine, sulfamethoxazole-trimethoprim, and methylPREDNISolone.  Meds ordered this encounter  Medications  . sulfamethoxazole-trimethoprim (BACTRIM DS,SEPTRA DS) 800-160 MG tablet    Sig: Take 1 tablet by mouth 2 (two) times daily.    Dispense:  20 tablet    Refill:  0    Order Specific Question:  Supervising Provider    Answer:  Deborra Medina L [[id number]]  . methylPREDNISolone (MEDROL) 4 MG tablet    Sig: Take 6 tablets by mouth with breakfast or lunch and decrease by 1 tablet each day until gone.    Dispense:  21 tablet    Refill:  0    Order Specific Question:  Supervising Provider    Answer:  Crecencio Mc [[id number]]     Follow-up: Return if symptoms worsen or fail to improve.

## 2015-11-16 ENCOUNTER — Other Ambulatory Visit (INDEPENDENT_AMBULATORY_CARE_PROVIDER_SITE_OTHER): Payer: Medicare Other

## 2015-11-16 ENCOUNTER — Telehealth: Payer: Self-pay | Admitting: *Deleted

## 2015-11-16 DIAGNOSIS — E119 Type 2 diabetes mellitus without complications: Secondary | ICD-10-CM

## 2015-11-16 DIAGNOSIS — I10 Essential (primary) hypertension: Secondary | ICD-10-CM | POA: Diagnosis not present

## 2015-11-16 LAB — COMPREHENSIVE METABOLIC PANEL
ALBUMIN: 4.5 g/dL (ref 3.5–5.2)
ALT: 19 U/L (ref 0–53)
AST: 16 U/L (ref 0–37)
Alkaline Phosphatase: 67 U/L (ref 39–117)
BILIRUBIN TOTAL: 0.6 mg/dL (ref 0.2–1.2)
BUN: 21 mg/dL (ref 6–23)
CALCIUM: 9.7 mg/dL (ref 8.4–10.5)
CHLORIDE: 99 meq/L (ref 96–112)
CO2: 30 mEq/L (ref 19–32)
CREATININE: 1.15 mg/dL (ref 0.40–1.50)
GFR: 67.14 mL/min (ref >60.00)
Glucose, Bld: 134 mg/dL — ABNORMAL HIGH (ref 70–99)
Potassium: 4.5 mEq/L (ref 3.5–5.1)
SODIUM: 139 meq/L (ref 135–145)
TOTAL PROTEIN: 6.8 g/dL (ref 6.0–8.3)

## 2015-11-16 LAB — LIPID PANEL
Cholesterol: 140 mg/dL (ref 0–200)
HDL: 45.8 mg/dL (ref >39.00)
NonHDL: 93.92
TRIGLYCERIDES: 224 mg/dL — AB (ref 0.0–149.0)
Total CHOL/HDL Ratio: 3
VLDL: 44.8 mg/dL — ABNORMAL HIGH (ref 0.0–40.0)

## 2015-11-16 LAB — LDL CHOLESTEROL, DIRECT: LDL DIRECT: 68 mg/dL

## 2015-11-16 LAB — HEMOGLOBIN A1C: Hgb A1c MFr Bld: 6.4 % (ref 4.6–6.5)

## 2015-11-16 NOTE — Telephone Encounter (Signed)
Cmp, lipids and A1c for DM

## 2015-11-16 NOTE — Telephone Encounter (Signed)
Labs and dx?  

## 2015-11-17 ENCOUNTER — Other Ambulatory Visit: Payer: Self-pay | Admitting: Internal Medicine

## 2015-11-19 ENCOUNTER — Encounter: Payer: Self-pay | Admitting: Internal Medicine

## 2015-11-19 ENCOUNTER — Ambulatory Visit (INDEPENDENT_AMBULATORY_CARE_PROVIDER_SITE_OTHER): Payer: Medicare Other | Admitting: Internal Medicine

## 2015-11-19 VITALS — BP 130/79 | HR 80 | Temp 98.2°F | Ht 72.0 in | Wt 226.2 lb

## 2015-11-19 DIAGNOSIS — I10 Essential (primary) hypertension: Secondary | ICD-10-CM | POA: Diagnosis not present

## 2015-11-19 DIAGNOSIS — J329 Chronic sinusitis, unspecified: Secondary | ICD-10-CM | POA: Diagnosis not present

## 2015-11-19 DIAGNOSIS — E119 Type 2 diabetes mellitus without complications: Secondary | ICD-10-CM | POA: Diagnosis not present

## 2015-11-19 MED ORDER — LEVOFLOXACIN 500 MG PO TABS: 500.0000 mg | ORAL_TABLET | Freq: Every day | ORAL | Status: DC

## 2015-11-19 MED ORDER — AMLODIPINE BESYLATE 10 MG PO TABS: 10.0000 mg | ORAL_TABLET | Freq: Every day | ORAL | Status: DC

## 2015-11-19 NOTE — Assessment & Plan Note (Signed)
BP Readings from Last 3 Encounters:  11/19/15 130/79  11/11/15 130/76  10/15/15 158/80   BP well controlled. Will continue current medication.

## 2015-11-19 NOTE — Progress Notes (Addendum)
Subjective:    Patient ID: Larry Roman., male    DOB: Mar 02, 1946, 69 y.o.   MRN: [id number]  HPI  68YO male presents for follow up.  Recently seen for sinus infection. On prednisone and bactrim. Taking Mucinex. Continues to cough up mucous. Energy level low. Has ENT evaluation with Dr. Pryor Ochoa pending.  DM - BG in 120-130s. Trying to follow a healthy diet. Compliant with medication.    Wt Readings from Last 3 Encounters:  11/19/15 226 lb 4 oz (102.626 kg)  11/11/15 226 lb 9.6 oz (102.785 kg)  10/15/15 227 lb (102.967 kg)   BP Readings from Last 3 Encounters:  11/19/15 130/79  11/11/15 130/76  10/15/15 158/80    Past Medical History  Diagnosis Date  . HTN (hypertension)   . DM (diabetes mellitus) ([place])   . Depression   . Other allergy, other than to medicinal agents   . Extrinsic asthma, unspecified     childhood   Family History  Problem Relation Age of Onset  . Heart failure Father   . Heart attack Mother    Past Surgical History  Procedure Laterality Date  . Tonsillectomy    . Back surgery    . Cataract extraction, bilateral  2016    Dr. Kerman Passey at [place]  . Marital Status: Married    Spouse Name: N/A  . Number of Children: 2  . Years of Education: N/A   Occupational History  .     Social History Main Topics  . Smoking status: Never Smoker   . Smokeless tobacco: Never Used     Comment: tobacco use - no  . Alcohol Use: 0.0 oz/week    0 Standard drinks or equivalent per week  . Drug Use: No  . Sexual Activity: Not Asked   Other Topics Concern  . None   Social History Narrative    Review of Systems  Constitutional: Positive for fatigue. Negative for fever, chills, activity change, appetite change and unexpected weight change.  HENT: Positive for congestion, postnasal drip, rhinorrhea and sinus pressure. Negative for ear discharge, ear pain, hearing loss, nosebleeds, sneezing, sore throat,  tinnitus, trouble swallowing and voice change.   Eyes: Negative for discharge, redness, itching and visual disturbance.  Respiratory: Positive for cough. Negative for chest tightness, shortness of breath, wheezing and stridor.   Cardiovascular: Negative for chest pain, palpitations and leg swelling.  Gastrointestinal: Negative for nausea, vomiting, abdominal pain, diarrhea, constipation and abdominal distention.  Genitourinary: Negative for dysuria, urgency and difficulty urinating.  Musculoskeletal: Negative for myalgias, arthralgias, gait problem, neck pain and neck stiffness.  Skin: Negative for color change and rash.  Neurological: Negative for dizziness, facial asymmetry and headaches.  Hematological: Negative for adenopathy.  Psychiatric/Behavioral: Negative for sleep disturbance and dysphoric mood. The patient is not nervous/anxious.        Objective:    BP 130/79 mmHg  Pulse 80  Temp(Src) 98.2 F (36.8 C) (Oral)  Ht 6' (1.829 m)  Wt 226 lb 4 oz (102.626 kg)  BMI 30.68 kg/m2  SpO2 97% Physical Exam  Constitutional: He is oriented to person, place, and time. He appears well-developed and well-nourished. No distress.  HENT:  Head: Normocephalic and atraumatic.  Right Ear: Tympanic membrane and external ear normal.  Left Ear: Tympanic membrane and external ear normal.  Nose: No mucosal edema or rhinorrhea. Right sinus exhibits frontal sinus tenderness. Left sinus exhibits frontal sinus tenderness.  Mouth/Throat: Oropharynx is clear and moist. No oropharyngeal exudate.  Eyes: Conjunctivae and EOM are normal. Pupils are equal, round, and reactive to light. Right eye exhibits no discharge. Left eye exhibits no discharge. No scleral icterus.  Neck: Normal range of motion. Neck supple. No tracheal deviation present. No thyromegaly present.  Cardiovascular: Normal rate, regular rhythm and normal heart sounds.  Exam reveals no gallop and no friction rub.   No murmur  heard. Pulmonary/Chest: Effort normal and breath sounds normal. No accessory muscle usage. No tachypnea. No respiratory distress. He has no decreased breath sounds. He has no wheezes. He has no rhonchi. He has no rales. He exhibits no tenderness.  Musculoskeletal: Normal range of motion. He exhibits no edema.  Lymphadenopathy:    He has no cervical adenopathy.  Neurological: He is alert and oriented to person, place, and time. No cranial nerve deficit. Coordination normal.  Skin: Skin is warm and dry. No rash noted. He is not diaphoretic. No erythema. No pallor.  Psychiatric: He has a normal mood and affect. His behavior is normal. Judgment and thought content normal.          Assessment & Plan:   Problem List Items Addressed This Visit      Unprioritized   Diabetes mellitus type 2, controlled ([place])    BG very well controlled. Will continue Metformin. Follow up in 3 months and prn.      Hypertension    BP Readings from Last 3 Encounters:  11/19/15 130/79  11/11/15 130/76  10/15/15 158/80   BP well controlled. Will continue current medication.      Relevant Medications   amLODipine (NORVASC) 10 MG tablet   Recurrent sinus infections - Primary    No improvement with recent Bactrim. Will change to Levaquin. Follow up with ENT as scheduled. Question if culture and direct visualization might be helpful.      Relevant Medications   levofloxacin (LEVAQUIN) 500 MG tablet       Return in about 6 months (around 05/19/2016) for Recheck of Diabetes.

## 2015-11-19 NOTE — Patient Instructions (Signed)
Stop Bactrim.  Start Levaquin. Please take a probiotic while on this medication.  Follow up with Dr. Pryor Ochoa as scheduled.

## 2015-11-19 NOTE — Assessment & Plan Note (Signed)
BG very well controlled. Will continue Metformin. Follow up in 3 months and prn.

## 2015-11-19 NOTE — Progress Notes (Signed)
Pre visit review using our clinic review tool, if applicable. No additional management support is needed unless otherwise documented below in the visit note. 

## 2015-11-19 NOTE — Assessment & Plan Note (Signed)
No improvement with recent Bactrim. Will change to Levaquin. Follow up with ENT as scheduled. Question if culture and direct visualization might be helpful.

## 2015-11-20 DIAGNOSIS — J320 Chronic maxillary sinusitis: Secondary | ICD-10-CM | POA: Diagnosis not present

## 2015-12-10 DIAGNOSIS — M1711 Unilateral primary osteoarthritis, right knee: Secondary | ICD-10-CM | POA: Diagnosis not present

## 2015-12-21 DIAGNOSIS — R2689 Other abnormalities of gait and mobility: Secondary | ICD-10-CM | POA: Diagnosis not present

## 2015-12-21 DIAGNOSIS — M25561 Pain in right knee: Secondary | ICD-10-CM | POA: Diagnosis not present

## 2015-12-21 DIAGNOSIS — M25661 Stiffness of right knee, not elsewhere classified: Secondary | ICD-10-CM | POA: Diagnosis not present

## 2015-12-23 ENCOUNTER — Other Ambulatory Visit: Payer: Self-pay | Admitting: Internal Medicine

## 2015-12-23 DIAGNOSIS — M25661 Stiffness of right knee, not elsewhere classified: Secondary | ICD-10-CM | POA: Diagnosis not present

## 2015-12-23 DIAGNOSIS — M25561 Pain in right knee: Secondary | ICD-10-CM | POA: Diagnosis not present

## 2016-01-01 ENCOUNTER — Other Ambulatory Visit: Payer: Self-pay | Admitting: Internal Medicine

## 2016-01-05 ENCOUNTER — Encounter: Payer: Self-pay | Admitting: Internal Medicine

## 2016-01-06 ENCOUNTER — Other Ambulatory Visit: Payer: Self-pay | Admitting: *Deleted

## 2016-01-06 MED ORDER — KETOCONAZOLE 2 % EX CREA: 1.0000 "application " | TOPICAL_CREAM | Freq: Every day | CUTANEOUS | Status: DC

## 2016-01-06 MED ORDER — TRIAMCINOLONE ACETONIDE 0.1 % EX CREA: 1.0000 "application " | TOPICAL_CREAM | Freq: Two times a day (BID) | CUTANEOUS | Status: DC

## 2016-01-18 ENCOUNTER — Other Ambulatory Visit: Payer: Self-pay | Admitting: *Deleted

## 2016-01-18 ENCOUNTER — Encounter: Payer: Self-pay | Admitting: Internal Medicine

## 2016-01-18 DIAGNOSIS — M549 Dorsalgia, unspecified: Secondary | ICD-10-CM

## 2016-01-18 MED ORDER — DULOXETINE HCL 30 MG PO CPEP: 30.0000 mg | ORAL_CAPSULE | Freq: Every day | ORAL | Status: DC

## 2016-01-27 ENCOUNTER — Other Ambulatory Visit: Payer: Self-pay | Admitting: Internal Medicine

## 2016-02-18 DIAGNOSIS — M170 Bilateral primary osteoarthritis of knee: Secondary | ICD-10-CM | POA: Diagnosis not present

## 2016-02-22 ENCOUNTER — Encounter: Payer: Self-pay | Admitting: Internal Medicine

## 2016-02-23 ENCOUNTER — Other Ambulatory Visit: Payer: Self-pay | Admitting: Internal Medicine

## 2016-02-23 MED ORDER — ZOLPIDEM TARTRATE 5 MG PO TABS: 5.0000 mg | ORAL_TABLET | Freq: Every evening | ORAL | Status: DC | PRN

## 2016-02-23 NOTE — Telephone Encounter (Signed)
Was refilled 05/2015, but patient states it was refilled in December 2016. Please advise?

## 2016-02-28 ENCOUNTER — Other Ambulatory Visit: Payer: Self-pay | Admitting: Internal Medicine

## 2016-02-29 ENCOUNTER — Other Ambulatory Visit: Payer: Self-pay | Admitting: Internal Medicine

## 2016-04-04 ENCOUNTER — Encounter: Payer: Self-pay | Admitting: Internal Medicine

## 2016-04-04 ENCOUNTER — Other Ambulatory Visit: Payer: Self-pay | Admitting: Internal Medicine

## 2016-04-04 DIAGNOSIS — M549 Dorsalgia, unspecified: Secondary | ICD-10-CM

## 2016-04-04 MED ORDER — DULOXETINE HCL 30 MG PO CPEP: 30.0000 mg | ORAL_CAPSULE | Freq: Every day | ORAL | Status: DC

## 2016-04-04 NOTE — Telephone Encounter (Signed)
Patient is switching pharmacy's . He is switching to [facility] and would like a 90-day supply sent over. It looks like his last prescriptions was disp with 15. Please advise?

## 2016-04-04 NOTE — Telephone Encounter (Signed)
Fine to send 90 day supply

## 2016-04-22 ENCOUNTER — Ambulatory Visit (INDEPENDENT_AMBULATORY_CARE_PROVIDER_SITE_OTHER): Payer: Medicare Other

## 2016-04-22 VITALS — BP 150/80 | HR 62 | Temp 97.1°F | Resp 14 | Ht 71.0 in | Wt 228.8 lb

## 2016-04-22 DIAGNOSIS — Z0000 Encounter for general adult medical examination without abnormal findings: Secondary | ICD-10-CM

## 2016-04-22 NOTE — Progress Notes (Signed)
Subjective:   Larry Roman. is a 70 y.o. male who presents for Medicare Annual/Subsequent preventive examination.  Review of Systems:  No ROS.  Medicare Wellness Visit.  Cardiac Risk Factors include: advanced age (>60men, >61 women);male gender;hypertension;diabetes mellitus     Objective:    Vitals: BP 150/80 mmHg  Pulse 62  Temp(Src) 97.1 F (36.2 C) (Oral)  Resp 14  Ht 5\' 11"  (1.803 m)  Wt 228 lb 12.8 oz (103.783 kg)  BMI 31.93 kg/m2  SpO2 96%  Body mass index is 31.93 kg/(m^2).  Tobacco History  Smoking status  . Never Smoker   Smokeless tobacco  . Never Used    Comment: tobacco use - no     Counseling given: Not Answered   Past Medical History  Diagnosis Date  . HTN (hypertension)   . DM (diabetes mellitus) ([place])   . Depression   . Other allergy, other than to medicinal agents   . Extrinsic asthma, unspecified     childhood   Past Surgical History  Procedure Laterality Date  . Tonsillectomy    . Back surgery    . Cataract extraction, bilateral  2016    Dr. Kerman [facility]sey at [facility]   Family History  Problem Relation Age of Onset  . Heart failure Father   . Heart attack Mother    History  Sexual Activity  . Sexual Activity: Yes    Outpatient Encounter Prescriptions as of 04/22/2016  Medication Sig  . amLODipine (NORVASC) 10 MG tablet Take 1 tablet (10 mg total) by mouth daily.  Marland Kitchen aspirin EC 81 MG tablet Take 81 mg by mouth daily.    Marland Kitchen azelastine (ASTELIN) 0.1 % nasal spray USE 1 SPRAY IN EACH NOSTRIL TWICE DAILY AS DIRECTED  . Cholecalciferol (VITAMIN D3) 2000 UNITS TABS Take by mouth.  . Cyanocobalamin (B-12) 5000 MCG SUBL Place under the tongue.  . cyclobenzaprine (FLEXERIL) 10 MG tablet Take 1 tablet (10 mg total) by mouth 3 (three) times daily as needed.  . DULoxetine (CYMBALTA) 30 MG capsule Take 1 capsule (30 mg total) by mouth daily.  . furosemide (LASIX) 20 MG tablet TAKE 1 TABLET EVERY DAY  . HYDROcodone-acetaminophen  (NORCO/VICODIN) 5-325 MG tablet Take 1-2 tablets by mouth 3 (three) times daily as needed for moderate pain.  Marland Kitchen ketoconazole (NIZORAL) 2 % cream Apply 1 application topically daily.  Marland Kitchen loratadine (CLARITIN) 10 MG tablet Take 10 mg by mouth daily.  Marland Kitchen losartan (COZAAR) 100 MG tablet TAKE 1 TABLET (100 MG TOTAL) BY MOUTH DAILY.  . metFORMIN (GLUCOPHAGE) 500 MG tablet TAKE 1 TABLET (500 MG TOTAL) BY MOUTH 2 (TWO) TIMES DAILY WITH A MEAL.  Marland Kitchen ONE TOUCH ULTRA TEST test strip CHECK SUGAR ONCE DAILY. DX E11.9  . ranitidine (ZANTAC) 75 MG tablet Take 75 mg by mouth daily as needed for heartburn.  . zolpidem (AMBIEN) 5 MG tablet Take 1 tablet (5 mg total) by mouth at bedtime as needed.  . [DISCONTINUED] levofloxacin (LEVAQUIN) 500 MG tablet Take 1 tablet (500 mg total) by mouth daily.   No facility-administered encounter medications on file as of 04/22/2016.    Activities of Daily Living In your present state of health, do you have any difficulty performing the following activities: 04/22/2016  Hearing? N  Vision? N  Difficulty concentrating or making decisions? N  Walking or climbing stairs? N  Dressing or bathing? N  Doing errands, shopping? N  Preparing Food and eating ? N  Using the  Toilet? N  In the past six months, have you accidently leaked urine? N  Do you have problems with loss of bowel control? N  Managing your Medications? N  Managing your Finances? N  Housekeeping or managing your Housekeeping? N    Patient Care Team: Jackolyn Confer, MD as PCP - General (Internal Medicine)   Assessment:   This is a routine wellness examination for [facility]. The goal of the wellness visit is to assist the patient how to close the gaps in care and create a preventative care plan for the patient.   Taking VIT D3 as appropriate/Osteoporosis risk reviewed.  Medications reviewed; taking without issues or barriers.  Safety issues reviewed; smoke and carbon monoxide detectors in the home. Firearms  locked in a secure area within the home. Wears seatbelts when driving or riding with others. No violence in the home.  No identified risk were noted; The patient was oriented x 3; appropriate in dress and manner and no objective failures at ADL's or IADL's.   Type 2 diabetes mell-stable and followed by PCP. DMII wo cmp nt st u-stable and followed by PCP.  Hepatitis C Screening; postponed for next lab draw.  Educational material provided.  Patient Concerns:  Hx of back pain.  States he recently strained his back and is taking the muscle relaxer on file.  Follow up appointment offered; declined. Reports receiving bilateral knee injections for pain.  Stable and followed by Dr. Mack Guise. Encouraged to follow up with PCP as needed.  Exercise Activities and Dietary recommendations Current Exercise Habits: Home exercise routine, Type of exercise: walking, Frequency (Times/Week): 3, Intensity: Mild  Goals    . Healthy Lifestyle     Stay hydrated and drink plenty of fluids. Low carb foods.  Lean meats and vegetables. Stay active and continue walking for exercise.  Increase as tolerated.      Fall Risk Fall Risk  04/22/2016 02/12/2015 01/14/2014  Falls in the past year? No No No   Depression Screen PHQ 2/9 Scores 04/22/2016 02/12/2015 01/14/2014  PHQ - 2 Score 0 0 0    Cognitive Testing MMSE - Mini Mental State Exam 04/22/2016  Orientation to time 5  Orientation to Place 5  Registration 3  Attention/ Calculation 5  Recall 3  Language- name 2 objects 2  Language- repeat 1  Language- follow 3 step command 3  Language- read & follow direction 1  Write a sentence 1  Copy design 1  Total score 30    Immunization History  Administered Date(s) Administered  . Influenza Split 11/10/2011, 09/10/2012  . Influenza,inj,Quad PF,36+ Mos 10/02/2013, 08/30/2014, 09/14/2015  . Pneumococcal Conjugate-13 01/14/2014  . Pneumococcal Polysaccharide-23 07/16/2012  . Tdap 11/10/2011  . Varicella  11/16/2011  . Zoster 12/01/2011   Screening Tests Health Maintenance  Topic Date Due  . Hepatitis C Screening  1946-09-29  . OPHTHALMOLOGY EXAM  04/08/2016  . HEMOGLOBIN A1C  05/16/2016  . FOOT EXAM  05/17/2016  . INFLUENZA VACCINE  07/05/2016  . COLONOSCOPY  11/15/2020  . TETANUS/TDAP  11/09/2021  . ZOSTAVAX  Completed  . PNA vac Low Risk Adult  Completed      Plan:   End of life planning; Advance aging; Advanced directives discussed. Copy requested of current Living Will.  Return in June for scheduled visit.  Hepatitis C Screening, next lab draw.  Educational material provided.  Follow up with Dr. Gilford Rile as needed.    During the course of the visit the patient was educated  and counseled about the following appropriate screening and preventive services:   Vaccines to include Pneumoccal, Influenza, Hepatitis B, Td, Zostavax, HCV  Electrocardiogram  Cardiovascular Disease  Colorectal cancer screening  Diabetes screening  Prostate Cancer Screening  Glaucoma screening  Nutrition counseling   Smoking cessation counseling  Patient Instructions (the written plan) was given to the patient.    Varney Biles, LPN  [phone]

## 2016-04-22 NOTE — Patient Instructions (Addendum)
[name] , Thank you for taking time to come for your Medicare Wellness Visit. I appreciate your ongoing commitment to your health goals. Please review the following plan we discussed and let me know if I can assist you in the future.   Return in June for scheduled visit.  Hepatitis C Screening, next lab draw.  Educational material provided.  Follow up with Dr. Gilford Rile as needed.   This is a list of the screening recommended for you and due dates:  Health Maintenance  Topic Date Due  .  Hepatitis C: One time screening is recommended by Center for Disease Control  (CDC) for  adults born from [birth year] through 1964.   [birth year]  . Eye exam for diabetics  04/08/2016  . Hemoglobin A1C  05/16/2016  . Complete foot exam   05/17/2016  . Flu Shot  07/05/2016  . Colon Cancer Screening  11/15/2020  . Tetanus Vaccine  11/09/2021  . Shingles Vaccine  Completed  . Pneumonia vaccines  Completed    Health Maintenance, Male A healthy lifestyle and preventative care can promote health and wellness.  Maintain regular health, dental, and eye exams.  Eat a healthy diet. Foods like vegetables, fruits, whole grains, low-fat dairy products, and lean protein foods contain the nutrients you need and are low in calories. Decrease your intake of foods high in solid fats, added sugars, and salt. Get information about a proper diet from your health care provider, if necessary.  Regular physical exercise is one of the most important things you can do for your health. Most adults should get at least 150 minutes of moderate-intensity exercise (any activity that increases your heart rate and causes you to sweat) each week. In addition, most adults need muscle-strengthening exercises on 2 or more days a week.   Maintain a healthy weight. The body mass index (BMI) is a screening tool to identify possible weight problems. It provides an estimate of body fat based on height and weight. Your health care provider can find  your BMI and can help you achieve or maintain a healthy weight. For males 20 years and older:  A BMI below 18.5 is considered underweight.  A BMI of 18.5 to 24.9 is normal.  A BMI of 25 to 29.9 is considered overweight.  A BMI of 30 and above is considered obese.  Maintain normal blood lipids and cholesterol by exercising and minimizing your intake of saturated fat. Eat a balanced diet with plenty of fruits and vegetables. Blood tests for lipids and cholesterol should begin at age 47 and be repeated every 5 years. If your lipid or cholesterol levels are high, you are over age 33, or you are at high risk for heart disease, you may need your cholesterol levels checked more frequently.Ongoing high lipid and cholesterol levels should be treated with medicines if diet and exercise are not working.  If you smoke, find out from your health care provider how to quit. If you do not use tobacco, do not start.  Lung cancer screening is recommended for adults aged 46-80 years who are at high risk for developing lung cancer because of a history of smoking. A yearly low-dose CT scan of the lungs is recommended for people who have at least a 30-pack-year history of smoking and are current smokers or have quit within the past 15 years. A pack year of smoking is smoking an average of 1 pack of cigarettes a day for 1 year (for example, a 30-pack-year history of  smoking could mean smoking 1 pack a day for 30 years or 2 packs a day for 15 years). Yearly screening should continue until the smoker has stopped smoking for at least 15 years. Yearly screening should be stopped for people who develop a health problem that would prevent them from having lung cancer treatment.  If you choose to drink alcohol, do not have more than 2 drinks per day. One drink is considered to be 12 oz (360 mL) of beer, 5 oz (150 mL) of wine, or 1.5 oz (45 mL) of liquor.  Avoid the use of street drugs. Do not share needles with anyone. Ask for  help if you need support or instructions about stopping the use of drugs.  High blood pressure causes heart disease and increases the risk of stroke. High blood pressure is more likely to develop in:  People who have blood pressure in the end of the normal range (100-139/85-89 mm Hg).  People who are overweight or obese.  People who are African American.  If you are 54-42 years of age, have your blood pressure checked every 3-5 years. If you are 40 years of age or older, have your blood pressure checked every year. You should have your blood pressure measured twice--once when you are at a hospital or clinic, and once when you are not at a hospital or clinic. Record the average of the two measurements. To check your blood pressure when you are not at a hospital or clinic, you can use:  An automated blood pressure machine at a pharmacy.  A home blood pressure monitor.  If you are 55-23 years old, ask your health care provider if you should take aspirin to prevent heart disease.  Diabetes screening involves taking a blood sample to check your fasting blood sugar level. This should be done once every 3 years after age 33 if you are at a normal weight and without risk factors for diabetes. Testing should be considered at a younger age or be carried out more frequently if you are overweight and have at least 1 risk factor for diabetes.  Colorectal cancer can be detected and often prevented. Most routine colorectal cancer screening begins at the age of 82 and continues through age 32. However, your health care provider may recommend screening at an earlier age if you have risk factors for colon cancer. On a yearly basis, your health care provider may provide home test kits to check for hidden blood in the stool. A small camera at the end of a tube may be used to directly examine the colon (sigmoidoscopy or colonoscopy) to detect the earliest forms of colorectal cancer. Talk to your health care provider  about this at age 7 when routine screening begins. A direct exam of the colon should be repeated every 5-10 years through age 48, unless early forms of precancerous polyps or small growths are found.  People who are at an increased risk for hepatitis B should be screened for this virus. You are considered at high risk for hepatitis B if:  You were born in a country where hepatitis B occurs often. Talk with your health care provider about which countries are considered high risk.  Your parents were born in a high-risk country and you have not received a shot to protect against hepatitis B (hepatitis B vaccine).  You have HIV or AIDS.  You use needles to inject street drugs.  You live with, or have sex with, someone who has hepatitis B.  You are a man who has sex with other men (MSM).  You get hemodialysis treatment.  You take certain medicines for conditions like cancer, organ transplantation, and autoimmune conditions.  Hepatitis C blood testing is recommended for all people born from [birth year] through 1965 and any individual with known risk factors for hepatitis C.  Healthy men should no longer receive prostate-specific antigen (PSA) blood tests as part of routine cancer screening. Talk to your health care provider about prostate cancer screening.  Testicular cancer screening is not recommended for adolescents or adult males who have no symptoms. Screening includes self-exam, a health care provider exam, and other screening tests. Consult with your health care provider about any symptoms you have or any concerns you have about testicular cancer.  Practice safe sex. Use condoms and avoid high-risk sexual practices to reduce the spread of sexually transmitted infections (STIs).  You should be screened for STIs, including gonorrhea and chlamydia if:  You are sexually active and are younger than 24 years.  You are older than 24 years, and your health care provider tells you that you are at  risk for this type of infection.  Your sexual activity has changed since you were last screened, and you are at an increased risk for chlamydia or gonorrhea. Ask your health care provider if you are at risk.  If you are at risk of being infected with HIV, it is recommended that you take a prescription medicine daily to prevent HIV infection. This is called pre-exposure prophylaxis (PrEP). You are considered at risk if:  You are a man who has sex with other men (MSM).  You are a heterosexual man who is sexually active with multiple partners.  You take drugs by injection.  You are sexually active with a partner who has HIV.  Talk with your health care provider about whether you are at high risk of being infected with HIV. If you choose to begin PrEP, you should first be tested for HIV. You should then be tested every 3 months for as long as you are taking PrEP.  Use sunscreen. Apply sunscreen liberally and repeatedly throughout the day. You should seek shade when your shadow is shorter than you. Protect yourself by wearing long sleeves, pants, a wide-brimmed hat, and sunglasses year round whenever you are outdoors.  Tell your health care provider of new moles or changes in moles, especially if there is a change in shape or color. Also, tell your health care provider if a mole is larger than the size of a pencil eraser.  A one-time screening for abdominal aortic aneurysm (AAA) and surgical repair of large AAAs by ultrasound is recommended for men aged 34-75 years who are current or former smokers.  Stay current with your vaccines (immunizations).   This information is not intended to replace advice given to you by your health care provider. Make sure you discuss any questions you have with your health care provider.   Document Released: 05/19/2008 Document Revised: 12/12/2014 Document Reviewed: 04/18/2011 Elsevier Interactive Patient Education [employer].

## 2016-04-22 NOTE — Progress Notes (Signed)
Annual Wellness Visit as completed by Health Coach was reviewed in full.  

## 2016-05-03 ENCOUNTER — Other Ambulatory Visit: Payer: Self-pay | Admitting: Internal Medicine

## 2016-05-04 NOTE — Telephone Encounter (Signed)
Refill request for ketoconazole, last seen [id number], last filled [id number].  Please advise.

## 2016-05-05 DIAGNOSIS — M9903 Segmental and somatic dysfunction of lumbar region: Secondary | ICD-10-CM | POA: Diagnosis not present

## 2016-05-05 DIAGNOSIS — M5442 Lumbago with sciatica, left side: Secondary | ICD-10-CM | POA: Diagnosis not present

## 2016-05-09 DIAGNOSIS — M5442 Lumbago with sciatica, left side: Secondary | ICD-10-CM | POA: Diagnosis not present

## 2016-05-09 DIAGNOSIS — M9903 Segmental and somatic dysfunction of lumbar region: Secondary | ICD-10-CM | POA: Diagnosis not present

## 2016-05-11 LAB — HM DIABETES EYE EXAM

## 2016-05-12 ENCOUNTER — Encounter: Payer: Self-pay | Admitting: Internal Medicine

## 2016-05-12 DIAGNOSIS — E119 Type 2 diabetes mellitus without complications: Secondary | ICD-10-CM

## 2016-05-12 DIAGNOSIS — M5442 Lumbago with sciatica, left side: Secondary | ICD-10-CM | POA: Diagnosis not present

## 2016-05-12 DIAGNOSIS — I10 Essential (primary) hypertension: Secondary | ICD-10-CM

## 2016-05-12 DIAGNOSIS — M9903 Segmental and somatic dysfunction of lumbar region: Secondary | ICD-10-CM | POA: Diagnosis not present

## 2016-05-12 NOTE — Telephone Encounter (Signed)
Spoke to patient, he will be coming to draw his fasting labs tomorrow morning.

## 2016-05-13 ENCOUNTER — Other Ambulatory Visit: Payer: Medicare Other

## 2016-05-16 ENCOUNTER — Other Ambulatory Visit (INDEPENDENT_AMBULATORY_CARE_PROVIDER_SITE_OTHER): Payer: Medicare Other

## 2016-05-16 DIAGNOSIS — E119 Type 2 diabetes mellitus without complications: Secondary | ICD-10-CM

## 2016-05-16 DIAGNOSIS — M9903 Segmental and somatic dysfunction of lumbar region: Secondary | ICD-10-CM | POA: Diagnosis not present

## 2016-05-16 DIAGNOSIS — I10 Essential (primary) hypertension: Secondary | ICD-10-CM | POA: Diagnosis not present

## 2016-05-16 DIAGNOSIS — M5442 Lumbago with sciatica, left side: Secondary | ICD-10-CM | POA: Diagnosis not present

## 2016-05-16 LAB — LIPID PANEL
CHOLESTEROL: 147 mg/dL (ref 0–200)
HDL: 43.5 mg/dL (ref >39.00)
LDL Cholesterol: 68 mg/dL (ref 0–99)
NonHDL: 103.18
TRIGLYCERIDES: 177 mg/dL — AB (ref 0.0–149.0)
Total CHOL/HDL Ratio: 3
VLDL: 35.4 mg/dL (ref 0.0–40.0)

## 2016-05-16 LAB — COMPREHENSIVE METABOLIC PANEL
ALK PHOS: 56 U/L (ref 39–117)
ALT: 28 U/L (ref 0–53)
AST: 23 U/L (ref 0–37)
Albumin: 4.5 g/dL (ref 3.5–5.2)
BILIRUBIN TOTAL: 0.9 mg/dL (ref 0.2–1.2)
BUN: 20 mg/dL (ref 6–23)
CALCIUM: 9.4 mg/dL (ref 8.4–10.5)
CO2: 28 mEq/L (ref 19–32)
Chloride: 101 mEq/L (ref 96–112)
Creatinine, Ser: 0.87 mg/dL (ref 0.40–1.50)
GFR: 92.51 mL/min (ref >60.00)
Glucose, Bld: 119 mg/dL — ABNORMAL HIGH (ref 70–99)
POTASSIUM: 4.1 meq/L (ref 3.5–5.1)
Sodium: 138 mEq/L (ref 135–145)
TOTAL PROTEIN: 7.2 g/dL (ref 6.0–8.3)

## 2016-05-16 LAB — MICROALBUMIN / CREATININE URINE RATIO
CREATININE, U: 45.4 mg/dL
MICROALB/CREAT RATIO: 5.1 mg/g (ref 0.0–30.0)
Microalb, Ur: 2.3 mg/dL — ABNORMAL HIGH (ref 0.0–1.9)

## 2016-05-16 LAB — HEMOGLOBIN A1C: Hgb A1c MFr Bld: 6.5 % (ref 4.6–6.5)

## 2016-05-19 ENCOUNTER — Encounter: Payer: Self-pay | Admitting: Internal Medicine

## 2016-05-19 ENCOUNTER — Ambulatory Visit (INDEPENDENT_AMBULATORY_CARE_PROVIDER_SITE_OTHER): Payer: Medicare Other | Admitting: Internal Medicine

## 2016-05-19 VITALS — BP 148/72 | HR 69 | Ht 72.0 in | Wt 229.2 lb

## 2016-05-19 DIAGNOSIS — M9903 Segmental and somatic dysfunction of lumbar region: Secondary | ICD-10-CM | POA: Diagnosis not present

## 2016-05-19 DIAGNOSIS — M549 Dorsalgia, unspecified: Secondary | ICD-10-CM

## 2016-05-19 DIAGNOSIS — I10 Essential (primary) hypertension: Secondary | ICD-10-CM

## 2016-05-19 DIAGNOSIS — M545 Low back pain: Secondary | ICD-10-CM | POA: Diagnosis not present

## 2016-05-19 DIAGNOSIS — M5442 Lumbago with sciatica, left side: Secondary | ICD-10-CM | POA: Diagnosis not present

## 2016-05-19 DIAGNOSIS — E119 Type 2 diabetes mellitus without complications: Secondary | ICD-10-CM | POA: Diagnosis not present

## 2016-05-19 MED ORDER — DULOXETINE HCL 30 MG PO CPEP: 30.0000 mg | ORAL_CAPSULE | Freq: Every day | ORAL | Status: DC

## 2016-05-19 MED ORDER — AMLODIPINE BESYLATE 10 MG PO TABS: 10.0000 mg | ORAL_TABLET | Freq: Every day | ORAL | Status: DC

## 2016-05-19 MED ORDER — CYCLOBENZAPRINE HCL 10 MG PO TABS: 10.0000 mg | ORAL_TABLET | Freq: Three times a day (TID) | ORAL | Status: DC | PRN

## 2016-05-19 MED ORDER — ZOLPIDEM TARTRATE 5 MG PO TABS: 5.0000 mg | ORAL_TABLET | Freq: Every evening | ORAL | Status: DC | PRN

## 2016-05-19 MED ORDER — METFORMIN HCL 500 MG PO TABS
ORAL_TABLET | ORAL | Status: DC
Start: 2016-05-19 — End: 2017-06-08

## 2016-05-19 MED ORDER — LOSARTAN POTASSIUM 100 MG PO TABS: ORAL_TABLET | ORAL | Status: DC

## 2016-05-19 NOTE — Assessment & Plan Note (Signed)
Low back pain radiating to left hip. Question if this pain may be coming from hip. Currently under evaluation with ortho. Exam normal today with strength and sensation intact.  Will restart Cyclobenzaprine and use prn Hydrocodone for severe pain.

## 2016-05-19 NOTE — Assessment & Plan Note (Signed)
Lab Results  Component Value Date   HGBA1C 6.5 05/16/2016   BG well controlled. Continue Metformin

## 2016-05-19 NOTE — Progress Notes (Signed)
Pre visit review using our clinic review tool, if applicable. No additional management support is needed unless otherwise documented below in the visit note. 

## 2016-05-19 NOTE — Patient Instructions (Signed)
We will set up referral to Dr. Mack Guise.  Follow up 3 months.

## 2016-05-19 NOTE — Assessment & Plan Note (Signed)
BP Readings from Last 3 Encounters:  05/19/16 148/72  04/22/16 150/80  11/19/15 130/79   BP generally well controlled. Renal function normal. Continue current medication.

## 2016-05-19 NOTE — Progress Notes (Signed)
Subjective:    Patient ID: Larry Ramus., male    DOB: [birth year], 70 y.o.   MRN: [id number]  HPI  70YO male presents for follow up.  DM - BG running near 130s. Compliant with medication.  Lab Results  Component Value Date   HGBA1C 6.5 05/16/2016   Back pain - Chronic. Being followed by chiropractor. Question if hip problem. Described as burning pain in left sided buttock. Only in mornings. Improves with stretching. Severe at times.Seen by ortho, Dr. Raliegh Ip. Started on Meloxicam and Robaxin. No improvement with these. Has hydrocodone, but doesn't use because of constipation.   Wt Readings from Last 3 Encounters:  05/19/16 229 lb 3.2 oz (103.964 kg)  04/22/16 228 lb 12.8 oz (103.783 kg)  11/19/15 226 lb 4 oz (102.626 kg)   BP Readings from Last 3 Encounters:  05/19/16 148/72  04/22/16 150/80  11/19/15 130/79    Past Medical History  Diagnosis Date  . HTN (hypertension)   . DM (diabetes mellitus) ([place])   . Depression   . Other allergy, other than to medicinal agents   . Extrinsic asthma, unspecified     childhood   Family History  Problem Relation Age of Onset  . Heart failure Father   . Heart attack Mother    Past Surgical History  Procedure Laterality Date  . Tonsillectomy    . Back surgery    . Cataract extraction, bilateral  2016    Dr. Kerman Passey at [facility]  . Marital Status: Married    Spouse Name: N/A  . Number of Children: 2  . Years of Education: N/A   Occupational History  .     Social History Main Topics  . Smoking status: Never Smoker   . Smokeless tobacco: Never Used     Comment: tobacco use - no  . Alcohol Use: 0.0 oz/week    0 Standard drinks or equivalent per week  . Drug Use: No  . Sexual Activity: Yes   Other Topics Concern  . None   Social History Narrative    Review of Systems  Constitutional: Negative for fever, chills, activity change, appetite change, fatigue and unexpected  weight change.  Eyes: Negative for visual disturbance.  Respiratory: Negative for cough and shortness of breath.   Cardiovascular: Negative for chest pain, palpitations and leg swelling.  Gastrointestinal: Negative for nausea, vomiting, abdominal pain, diarrhea, constipation and abdominal distention.  Genitourinary: Negative for dysuria, urgency and difficulty urinating.  Musculoskeletal: Positive for myalgias, back pain and arthralgias. Negative for gait problem.  Skin: Negative for color change and rash.  Hematological: Negative for adenopathy.  Psychiatric/Behavioral: Negative for suicidal ideas, sleep disturbance and dysphoric mood. The patient is not nervous/anxious.        Objective:    BP 148/72 mmHg  Pulse 69  Ht 6' (1.829 m)  Wt 229 lb 3.2 oz (103.964 kg)  BMI 31.08 kg/m2  SpO2 96% Physical Exam  Constitutional: He is oriented to person, place, and time. He appears well-developed and well-nourished. No distress.  HENT:  Head: Normocephalic and atraumatic.  Right Ear: External ear normal.  Left Ear: External ear normal.  Nose: Nose normal.  Mouth/Throat: Oropharynx is clear and moist. No oropharyngeal exudate.  Eyes: Conjunctivae and EOM are normal. Pupils are equal, round, and reactive to light. Right eye exhibits no discharge. Left eye exhibits no discharge. No scleral icterus.  Neck: Normal range of motion. Neck  supple. No tracheal deviation present. No thyromegaly present.  Cardiovascular: Normal rate, regular rhythm and normal heart sounds.  Exam reveals no gallop and no friction rub.   No murmur heard. Pulmonary/Chest: Effort normal and breath sounds normal. No accessory muscle usage. No tachypnea. No respiratory distress. He has no decreased breath sounds. He has no wheezes. He has no rhonchi. He has no rales. He exhibits no tenderness.  Musculoskeletal: Normal range of motion. He exhibits no edema.       Left hip: He exhibits normal range of motion, normal strength  and no tenderness.  Lymphadenopathy:    He has no cervical adenopathy.  Neurological: He is alert and oriented to person, place, and time. No cranial nerve deficit. Coordination normal.  Skin: Skin is warm and dry. No rash noted. He is not diaphoretic. No erythema. No pallor.  Psychiatric: He has a normal mood and affect. His behavior is normal. Judgment and thought content normal.          Assessment & Plan:   Problem List Items Addressed This Visit      Unprioritized   Back pain    Low back pain radiating to left hip. Question if this pain may be coming from hip. Currently under evaluation with ortho. Exam normal today with strength and sensation intact.  Will restart Cyclobenzaprine and use prn Hydrocodone for severe pain.      Relevant Medications   cyclobenzaprine (FLEXERIL) 10 MG tablet   DULoxetine (CYMBALTA) 30 MG capsule   Other Relevant Orders   Ambulatory referral to Orthopedic Surgery   Diabetes mellitus type 2, controlled ([place]) - Primary    Lab Results  Component Value Date   HGBA1C 6.5 05/16/2016   BG well controlled. Continue Metformin      Relevant Medications   losartan (COZAAR) 100 MG tablet   metFORMIN (GLUCOPHAGE) 500 MG tablet   Hypertension    BP Readings from Last 3 Encounters:  05/19/16 148/72  04/22/16 150/80  11/19/15 130/79   BP generally well controlled. Renal function normal. Continue current medication.      Relevant Medications   losartan (COZAAR) 100 MG tablet   amLODipine (NORVASC) 10 MG tablet       Return in about 3 months (around 08/19/2016) for Recheck of Diabetes.  Ronette Deter, MD [facility]

## 2016-05-20 ENCOUNTER — Encounter: Payer: Self-pay | Admitting: Internal Medicine

## 2016-05-20 DIAGNOSIS — Z79899 Other long term (current) drug therapy: Secondary | ICD-10-CM | POA: Diagnosis not present

## 2016-05-20 DIAGNOSIS — Z79891 Long term (current) use of opiate analgesic: Secondary | ICD-10-CM | POA: Diagnosis not present

## 2016-05-23 DIAGNOSIS — M9903 Segmental and somatic dysfunction of lumbar region: Secondary | ICD-10-CM | POA: Diagnosis not present

## 2016-05-23 DIAGNOSIS — M5442 Lumbago with sciatica, left side: Secondary | ICD-10-CM | POA: Diagnosis not present

## 2016-05-26 DIAGNOSIS — M9903 Segmental and somatic dysfunction of lumbar region: Secondary | ICD-10-CM | POA: Diagnosis not present

## 2016-05-26 DIAGNOSIS — M5442 Lumbago with sciatica, left side: Secondary | ICD-10-CM | POA: Diagnosis not present

## 2016-05-30 ENCOUNTER — Encounter: Payer: Self-pay | Admitting: Internal Medicine

## 2016-06-02 DIAGNOSIS — M9903 Segmental and somatic dysfunction of lumbar region: Secondary | ICD-10-CM | POA: Diagnosis not present

## 2016-06-02 DIAGNOSIS — M5442 Lumbago with sciatica, left side: Secondary | ICD-10-CM | POA: Diagnosis not present

## 2016-06-09 DIAGNOSIS — M5442 Lumbago with sciatica, left side: Secondary | ICD-10-CM | POA: Diagnosis not present

## 2016-06-09 DIAGNOSIS — M9903 Segmental and somatic dysfunction of lumbar region: Secondary | ICD-10-CM | POA: Diagnosis not present

## 2016-06-16 DIAGNOSIS — M5416 Radiculopathy, lumbar region: Secondary | ICD-10-CM | POA: Diagnosis not present

## 2016-06-16 DIAGNOSIS — I10 Essential (primary) hypertension: Secondary | ICD-10-CM | POA: Diagnosis not present

## 2016-06-16 DIAGNOSIS — Z6830 Body mass index (BMI) 30.0-30.9, adult: Secondary | ICD-10-CM | POA: Diagnosis not present

## 2016-06-17 ENCOUNTER — Encounter: Payer: Self-pay | Admitting: Internal Medicine

## 2016-06-17 DIAGNOSIS — M4806 Spinal stenosis, lumbar region: Secondary | ICD-10-CM | POA: Diagnosis not present

## 2016-06-17 DIAGNOSIS — M5416 Radiculopathy, lumbar region: Secondary | ICD-10-CM | POA: Diagnosis not present

## 2016-06-21 DIAGNOSIS — M9903 Segmental and somatic dysfunction of lumbar region: Secondary | ICD-10-CM | POA: Diagnosis not present

## 2016-06-21 DIAGNOSIS — M5442 Lumbago with sciatica, left side: Secondary | ICD-10-CM | POA: Diagnosis not present

## 2016-06-23 DIAGNOSIS — M5416 Radiculopathy, lumbar region: Secondary | ICD-10-CM | POA: Diagnosis not present

## 2016-06-24 DIAGNOSIS — M5116 Intervertebral disc disorders with radiculopathy, lumbar region: Secondary | ICD-10-CM | POA: Diagnosis not present

## 2016-06-24 DIAGNOSIS — M47816 Spondylosis without myelopathy or radiculopathy, lumbar region: Secondary | ICD-10-CM | POA: Diagnosis not present

## 2016-06-24 DIAGNOSIS — M5416 Radiculopathy, lumbar region: Secondary | ICD-10-CM | POA: Diagnosis not present

## 2016-06-24 DIAGNOSIS — M4806 Spinal stenosis, lumbar region: Secondary | ICD-10-CM | POA: Diagnosis not present

## 2016-06-30 DIAGNOSIS — M5442 Lumbago with sciatica, left side: Secondary | ICD-10-CM | POA: Diagnosis not present

## 2016-06-30 DIAGNOSIS — M9903 Segmental and somatic dysfunction of lumbar region: Secondary | ICD-10-CM | POA: Diagnosis not present

## 2016-06-30 DIAGNOSIS — M5126 Other intervertebral disc displacement, lumbar region: Secondary | ICD-10-CM | POA: Diagnosis not present

## 2016-07-21 DIAGNOSIS — I10 Essential (primary) hypertension: Secondary | ICD-10-CM | POA: Diagnosis not present

## 2016-07-21 DIAGNOSIS — Z6830 Body mass index (BMI) 30.0-30.9, adult: Secondary | ICD-10-CM | POA: Diagnosis not present

## 2016-07-21 DIAGNOSIS — M5416 Radiculopathy, lumbar region: Secondary | ICD-10-CM | POA: Diagnosis not present

## 2016-08-09 DIAGNOSIS — M170 Bilateral primary osteoarthritis of knee: Secondary | ICD-10-CM | POA: Diagnosis not present

## 2016-08-16 DIAGNOSIS — M170 Bilateral primary osteoarthritis of knee: Secondary | ICD-10-CM | POA: Diagnosis not present

## 2016-08-19 ENCOUNTER — Encounter: Payer: Self-pay | Admitting: Family Medicine

## 2016-08-19 ENCOUNTER — Ambulatory Visit (INDEPENDENT_AMBULATORY_CARE_PROVIDER_SITE_OTHER): Payer: Medicare Other | Admitting: Family Medicine

## 2016-08-19 ENCOUNTER — Ambulatory Visit: Payer: Medicare Other | Admitting: Internal Medicine

## 2016-08-19 DIAGNOSIS — M549 Dorsalgia, unspecified: Secondary | ICD-10-CM

## 2016-08-19 DIAGNOSIS — E119 Type 2 diabetes mellitus without complications: Secondary | ICD-10-CM

## 2016-08-19 DIAGNOSIS — G4700 Insomnia, unspecified: Secondary | ICD-10-CM | POA: Diagnosis not present

## 2016-08-19 DIAGNOSIS — I10 Essential (primary) hypertension: Secondary | ICD-10-CM | POA: Diagnosis not present

## 2016-08-19 DIAGNOSIS — G8929 Other chronic pain: Secondary | ICD-10-CM | POA: Insufficient documentation

## 2016-08-19 MED ORDER — GLUCOSE BLOOD VI STRP: ORAL_STRIP | 3 refills | Status: DC

## 2016-08-19 MED ORDER — AZELASTINE HCL 0.1 % NA SOLN: NASAL | 3 refills | Status: DC

## 2016-08-19 MED ORDER — KETOCONAZOLE 2 % EX CREA: TOPICAL_CREAM | Freq: Every day | CUTANEOUS | 2 refills | Status: DC

## 2016-08-19 MED ORDER — ZOLPIDEM TARTRATE 5 MG PO TABS: 5.0000 mg | ORAL_TABLET | Freq: Every evening | ORAL | 1 refills | Status: DC | PRN

## 2016-08-19 NOTE — Addendum Note (Signed)
Addended by: Carmin Muskrat on: 08/19/2016 02:05 PM   Modules accepted: Orders

## 2016-08-19 NOTE — Assessment & Plan Note (Signed)
Stable.  Ambien refilled. 

## 2016-08-19 NOTE — Progress Notes (Signed)
Subjective:  Patient ID: Larry Ramus., male    DOB: 1946/03/21  Age: 70 y.o. MRN: [id number]  CC: Follow up  HPI:  70 year old male with hypertension, chronic back pain, DM 2 presents for follow-up.  DM  Well controlled.  Medications - Metformin.  Adverse effects - No.  Compliance - Yes.  Preventative care  Eye exam - Up to date.   Foot exam - In need of.  Last A1C - 6.5 in 6/12.  Urine microalbumin - On ARB.  HTN  Stable on Losartan or Norvasc.  Insomnia  Stable on Ambien.  Needs refill.  Social Hx   Social History   Social History  . Marital status: Married    Spouse name: N/A  . Number of children: 2  . Years of education: N/A   Occupational History  .  [employer]   Social History Main Topics  . Smoking status: Never Smoker  . Smokeless tobacco: Never Used     Comment: tobacco use - no  . Alcohol use 0.0 oz/week  . Drug use: No  . Sexual activity: Yes   Other Topics Concern  . None   Social History Narrative  . None    Review of Systems  Constitutional: Negative.   HENT: Positive for sinus pressure.   Neurological: Positive for headaches.   Objective:  BP (!) 145/88 (BP Location: Right Arm, Patient Position: Sitting, Cuff Size: Normal)   Pulse 85   Temp 98.7 F (37.1 C) (Oral)   Wt 223 lb 2 oz (101.2 kg)   SpO2 97%   BMI 30.26 kg/m   BP/Weight 08/19/2016 05/19/2016 [id number]  Systolic BP Q000111Q 123456 Q000111Q  Diastolic BP 88 72 80  Wt. (Lbs) 223.13 229.2 228.8  BMI 30.26 31.08 31.93    Physical Exam  Constitutional: He is oriented to person, place, and time. He appears well-developed. No distress.  Cardiovascular: Normal rate and regular rhythm.   Pulmonary/Chest: Effort normal. He has no wheezes. He has no rales.  Abdominal: Soft. He exhibits no distension. There is no tenderness. There is no rebound and no guarding.  Neurological: He is alert and oriented to person, place, and time.  Psychiatric: He has a normal mood  and affect.  Vitals reviewed.  Lab Results  Component Value Date   WBC 6.2 07/23/2014   HGB 13.9 07/23/2014   HCT 41.3 07/23/2014   PLT 249.0 07/23/2014   GLUCOSE 119 (H) 05/16/2016   CHOL 147 05/16/2016   TRIG 177.0 (H) 05/16/2016   HDL 43.50 05/16/2016   LDLDIRECT 68.0 11/16/2015   LDLCALC 68 05/16/2016   ALT 28 05/16/2016   AST 23 05/16/2016   NA 138 05/16/2016   K 4.1 05/16/2016   CL 101 05/16/2016   CREATININE 0.87 05/16/2016   BUN 20 05/16/2016   CO2 28 05/16/2016   TSH 1.86 07/23/2014   PSA 0.43 01/14/2014   HGBA1C 6.5 05/16/2016   MICROALBUR 2.3 (H) 05/16/2016    Assessment & Plan:   Problem List Items Addressed This Visit    Hypertension    Stable on Losartan and Norvasc. Continue.       Insomnia    Stable. Ambien refilled.       Diabetes mellitus type 2, controlled ([place])    Stable at goal. Continue metformin. Foot exam performed.        Other Visit Diagnoses   None.     Meds ordered this encounter  Medications  . zolpidem (AMBIEN)  5 MG tablet    Sig: Take 1 tablet (5 mg total) by mouth at bedtime as needed.    Dispense:  90 tablet    Refill:  1    Follow-up: Return in about 6 months (around 02/16/2017).  [place]

## 2016-08-19 NOTE — Progress Notes (Signed)
Pre visit review using our clinic review tool, if applicable. No additional management support is needed unless otherwise documented below in the visit note. 

## 2016-08-19 NOTE — Assessment & Plan Note (Signed)
Stable at goal. Continue metformin. Foot exam performed.

## 2016-08-19 NOTE — Patient Instructions (Signed)
Continue your current meds.  Follow up in 6 months.  Take care  Dr. Lacinda Axon

## 2016-08-19 NOTE — Assessment & Plan Note (Signed)
Stable on Losartan and Norvasc. Continue.

## 2016-08-23 DIAGNOSIS — M170 Bilateral primary osteoarthritis of knee: Secondary | ICD-10-CM | POA: Diagnosis not present

## 2016-08-25 ENCOUNTER — Ambulatory Visit (INDEPENDENT_AMBULATORY_CARE_PROVIDER_SITE_OTHER): Payer: Medicare Other

## 2016-08-25 DIAGNOSIS — Z23 Encounter for immunization: Secondary | ICD-10-CM | POA: Diagnosis not present

## 2016-08-29 ENCOUNTER — Other Ambulatory Visit: Payer: Self-pay | Admitting: Nurse Practitioner

## 2016-09-02 ENCOUNTER — Encounter: Payer: Self-pay | Admitting: Family Medicine

## 2016-09-02 ENCOUNTER — Ambulatory Visit (INDEPENDENT_AMBULATORY_CARE_PROVIDER_SITE_OTHER): Payer: Medicare Other | Admitting: Family Medicine

## 2016-09-02 DIAGNOSIS — J329 Chronic sinusitis, unspecified: Secondary | ICD-10-CM | POA: Diagnosis not present

## 2016-09-02 MED ORDER — SULFAMETHOXAZOLE-TRIMETHOPRIM 800-160 MG PO TABS: 1.0000 | ORAL_TABLET | Freq: Two times a day (BID) | ORAL | 0 refills | Status: DC

## 2016-09-02 NOTE — Progress Notes (Signed)
  Larry Rumps, MD Phone: [phone]  [name] Larry Roman. is a 70 y.o. male who presents today for same-day visit.  Sinusitis: Patient notes for the last 2 weeks he has had ear congestion, sore throat and postnasal drip, dry cough, and sinus pressure. He is not blowing anything out of his nose. No fevers. No shortness of breath. He was evaluated by ENT sometime last year for recurrent sinus infections and then saw his dentist who thought maybe it was his tooth but it didn't end up being his tooth. This is the first time he has had another sinus infection. He reports he responded well to Bactrim in the past.  ROS see history of present illness  Objective  Physical Exam Vitals:   09/02/16 0823  BP: 126/84  Pulse: 81  Temp: 98.5 F (36.9 C)    BP Readings from Last 3 Encounters:  09/02/16 126/84  08/19/16 (!) 145/88  05/19/16 (!) 148/72   Wt Readings from Last 3 Encounters:  09/02/16 226 lb 2 oz (102.6 kg)  08/19/16 223 lb 2 oz (101.2 kg)  05/19/16 229 lb 3.2 oz (104 kg)    Physical Exam  Constitutional: He is well-developed, well-nourished, and in no distress.  HENT:  Head: Normocephalic and atraumatic.  Mouth/Throat: Oropharynx is clear and moist. No oropharyngeal exudate.  Right TM normal, left TM obscured by cerumen initially, irrigated by nursing revealing a normal left TM  Eyes: Conjunctivae are normal. Pupils are equal, round, and reactive to light.  Neck: Neck supple.  Cardiovascular: Normal rate, regular rhythm and normal heart sounds.   Pulmonary/Chest: Effort normal and breath sounds normal.  Lymphadenopathy:    He has no cervical adenopathy.  Neurological: He is alert. Gait normal.  Skin: Skin is warm and dry.     Assessment/Plan: Please see individual problem list.  Recurrent sinus infections Patient with sinus infection. Given duration we will treat with antibiotics. Patient is requesting Bactrim. I did discuss that this is not a typical antibiotic for  sinus infections. He reports he has not responded to Augmentin in the past. He would like to proceed with Bactrim. This was sent to his pharmacy. He is given return precautions.   No orders of the defined types were placed in this encounter.   Meds ordered this encounter  Medications  . sulfamethoxazole-trimethoprim (BACTRIM DS,SEPTRA DS) 800-160 MG tablet    Sig: Take 1 tablet by mouth 2 (two) times daily.    Dispense:  14 tablet    Refill:  0   Larry Rumps, MD [place]

## 2016-09-02 NOTE — Patient Instructions (Signed)
Nice to meet you. You have sinus infection. We will treat this with Bactrim. If you develop fevers, cough productive of blood, shortness of breath, or any new or change in symptoms please seek medical attention.

## 2016-09-02 NOTE — Assessment & Plan Note (Addendum)
Patient with sinus infection. Given duration we will treat with antibiotics. Patient is requesting Bactrim. I did discuss that this is not a typical antibiotic for sinus infections. He reports he has not responded to Augmentin in the past. He would like to proceed with Bactrim. This was sent to his pharmacy. He is given return precautions.

## 2016-10-04 ENCOUNTER — Ambulatory Visit: Payer: Medicare Other

## 2016-10-07 ENCOUNTER — Ambulatory Visit: Payer: Medicare Other

## 2016-10-10 ENCOUNTER — Ambulatory Visit (INDEPENDENT_AMBULATORY_CARE_PROVIDER_SITE_OTHER): Payer: Medicare Other | Admitting: Family Medicine

## 2016-10-10 VITALS — BP 124/78 | HR 94 | Temp 98.6°F | Wt 230.1 lb

## 2016-10-10 DIAGNOSIS — R300 Dysuria: Secondary | ICD-10-CM

## 2016-10-10 DIAGNOSIS — E119 Type 2 diabetes mellitus without complications: Secondary | ICD-10-CM | POA: Diagnosis not present

## 2016-10-10 LAB — POC URINALSYSI DIPSTICK (AUTOMATED)
Bilirubin, UA: NEGATIVE
GLUCOSE UA: NEGATIVE
Nitrite, UA: NEGATIVE
Protein, UA: NEGATIVE
RBC UA: NEGATIVE
UROBILINOGEN UA: NEGATIVE
pH, UA: 5.5

## 2016-10-10 LAB — COMPREHENSIVE METABOLIC PANEL
ALBUMIN: 4.7 g/dL (ref 3.5–5.2)
ALT: 21 U/L (ref 0–53)
AST: 17 U/L (ref 0–37)
Alkaline Phosphatase: 75 U/L (ref 39–117)
BUN: 19 mg/dL (ref 6–23)
CALCIUM: 10.1 mg/dL (ref 8.4–10.5)
CHLORIDE: 101 meq/L (ref 96–112)
CO2: 31 mEq/L (ref 19–32)
Creatinine, Ser: 0.8 mg/dL (ref 0.40–1.50)
GFR: 101.79 mL/min (ref >60.00)
Glucose, Bld: 134 mg/dL — ABNORMAL HIGH (ref 70–99)
POTASSIUM: 4.8 meq/L (ref 3.5–5.1)
SODIUM: 139 meq/L (ref 135–145)
Total Bilirubin: 0.9 mg/dL (ref 0.2–1.2)
Total Protein: 7 g/dL (ref 6.0–8.3)

## 2016-10-10 LAB — HEMOGLOBIN A1C: HEMOGLOBIN A1C: 6.1 % (ref 4.6–6.5)

## 2016-10-10 NOTE — Progress Notes (Signed)
Pre visit review using our clinic review tool, if applicable. No additional management support is needed unless otherwise documented below in the visit note. 

## 2016-10-10 NOTE — Assessment & Plan Note (Signed)
New acute problem. With frequency, nocturia. Urinalysis with 1+ leukocytes. I doubt urinary tract infection given symptoms and the fact that he is a male. More likely to be LUTS from BPH. Will await urine culture.

## 2016-10-10 NOTE — Progress Notes (Signed)
Subjective:  Patient ID: Larry Roman., male    DOB: 06-29-1946  Age: 70 y.o. MRN: [id number]  CC: Burning with urination, sugars rising  HPI:  70 year old male with hypertension, DM 2, chronic low back pain presents with the above complaint.  Patient states that he has had mild dysuria for the past week. He reports associated urinary frequency. He also notes nocturia; he states that he gets up approximately 3 times a night. No associated fevers or chills. He used some Azo last week and had mild improvement. No associated fevers or chills. No abdominal pain. No known exacerbating factors.  Additionally, patient states that he has not been able to exercise recently. He states that his blood sugars have been increasing. He states that they are typically in the 140s. He endorses compliance with metformin. He's taking 500 mg twice daily.   Social Hx   Social History   Social History  . Marital status: Married    Spouse name: N/A  . Number of children: 2  . Years of education: N/A   Occupational History  .  [employer]   Social History Main Topics  . Smoking status: Never Smoker  . Smokeless tobacco: Never Used     Comment: tobacco use - no  . Alcohol use 0.0 oz/week  . Drug use: No  . Sexual activity: Yes   Other Topics Concern  . Not on file   Social History Narrative  . No narrative on file   Review of Systems  Constitutional: Negative.   Genitourinary: Positive for frequency.       Nocturia.   Objective:  BP 124/78 (BP Location: Left Arm, Patient Position: Sitting, Cuff Size: Large)   Pulse 94   Temp 98.6 F (37 C) (Oral)   Wt 230 lb 2 oz (104.4 kg)   BMI 31.21 kg/m   BP/Weight 10/10/2016 09/02/2016 4/97/0263  Systolic BP 785 885 027  Diastolic BP 78 84 88  Wt. (Lbs) 230.13 226.13 223.13  BMI 31.21 30.67 30.26   Physical Exam  Constitutional: He is oriented to person, place, and time. He appears well-developed. No distress.  Cardiovascular: Normal  rate and regular rhythm.   Pulmonary/Chest: Effort normal. He has no wheezes. He has no rales.  Abdominal: Soft. He exhibits no distension. There is no tenderness. There is no rebound and no guarding.  Neurological: He is alert and oriented to person, place, and time.  Psychiatric: He has a normal mood and affect.  Vitals reviewed.  Lab Results  Component Value Date   WBC 6.2 07/23/2014   HGB 13.9 07/23/2014   HCT 41.3 07/23/2014   PLT 249.0 07/23/2014   GLUCOSE 119 (H) 05/16/2016   CHOL 147 05/16/2016   TRIG 177.0 (H) 05/16/2016   HDL 43.50 05/16/2016   LDLDIRECT 68.0 11/16/2015   LDLCALC 68 05/16/2016   ALT 28 05/16/2016   AST 23 05/16/2016   NA 138 05/16/2016   K 4.1 05/16/2016   CL 101 05/16/2016   CREATININE 0.87 05/16/2016   BUN 20 05/16/2016   CO2 28 05/16/2016   TSH 1.86 07/23/2014   PSA 0.43 01/14/2014   HGBA1C 6.5 05/16/2016   MICROALBUR 2.3 (H) 05/16/2016   Results for orders placed or performed in visit on 10/10/16 (from the past 24 hour(s))  POCT Urinalysis Dipstick (Automated)     Status: Abnormal   Collection Time: 10/10/16  9:10 AM  Result Value Ref Range   Color, UA yellow  Clarity, UA clear    Glucose, UA negative    Bilirubin, UA neg    Ketones, UA 1+    Spec Grav, UA >=1.030    Blood, UA negative    pH, UA 5.5    Protein, UA negative    Urobilinogen, UA negative    Nitrite, UA negative    Leukocytes, UA small (1+) (A) Negative    Assessment & Plan:   Problem List Items Addressed This Visit    Dysuria - Primary    New acute problem. With frequency, nocturia. Urinalysis with 1+ leukocytes. I doubt urinary tract infection given symptoms and the fact that he is a male. More likely to be LUTS from BPH. Will await urine culture.       Relevant Orders   POCT Urinalysis Dipstick (Automated) (Completed)   Urine culture   Diabetes mellitus type 2, controlled ([place])    Worsening control. A1C today. Continuing metformin until A1C returns.  May need increase dosing.      Relevant Orders   Comp Met (CMET)   HgB A1c     Follow-up: Return in about 3 months (around 01/10/2017).  [place]

## 2016-10-10 NOTE — Patient Instructions (Signed)
We will wait on the culture results.  We will call with your lab results.  Follow up in ~ 3 months.  Take care  Dr. Lacinda Axon

## 2016-10-10 NOTE — Assessment & Plan Note (Signed)
Worsening control. A1C today. Continuing metformin until A1C returns. May need increase dosing.

## 2016-10-12 ENCOUNTER — Other Ambulatory Visit: Payer: Self-pay | Admitting: Family Medicine

## 2016-10-12 LAB — URINE CULTURE

## 2016-10-12 MED ORDER — TAMSULOSIN HCL 0.4 MG PO CAPS: 0.4000 mg | ORAL_CAPSULE | Freq: Every day | ORAL | 1 refills | Status: DC

## 2016-10-15 ENCOUNTER — Encounter: Payer: Self-pay | Admitting: Family Medicine

## 2016-10-17 ENCOUNTER — Other Ambulatory Visit: Payer: Self-pay | Admitting: Family Medicine

## 2016-10-17 MED ORDER — BLOOD GLUCOSE METER KIT: PACK | 0 refills | Status: DC

## 2016-10-20 ENCOUNTER — Other Ambulatory Visit: Payer: Self-pay | Admitting: Family Medicine

## 2016-10-20 ENCOUNTER — Encounter: Payer: Self-pay | Admitting: Family Medicine

## 2016-10-20 MED ORDER — EZ SMART BLOOD GLUCOSE LANCETS MISC: 3 refills | Status: DC

## 2016-10-20 MED ORDER — GLUCOSE BLOOD VI STRP: ORAL_STRIP | 3 refills | Status: DC

## 2016-10-21 ENCOUNTER — Telehealth: Payer: Self-pay | Admitting: *Deleted

## 2016-10-21 MED ORDER — EZ SMART BLOOD GLUCOSE LANCETS MISC: 3 refills | Status: DC

## 2016-10-21 MED ORDER — GLUCOSE BLOOD VI STRP: ORAL_STRIP | 12 refills | Status: DC

## 2016-10-21 NOTE — Addendum Note (Signed)
Addended by: Carmin Muskrat on: 10/21/2016 09:46 AM   Modules accepted: Orders

## 2016-10-21 NOTE — Telephone Encounter (Signed)
[facility] requested a resend for test strips -the Rx must say accu check aviva plus , with directions on how many times used daily and a diagnosis code. A resend for -5 soft click lancents, with directions on how many ties used daily, along with diagnosis code.  Pharmacy [facility] [address]

## 2016-10-22 ENCOUNTER — Other Ambulatory Visit: Payer: Self-pay | Admitting: Family Medicine

## 2016-10-22 ENCOUNTER — Encounter: Payer: Self-pay | Admitting: Family Medicine

## 2016-10-22 MED ORDER — GLUCOSE BLOOD VI STRP: ORAL_STRIP | 12 refills | Status: DC

## 2016-10-22 MED ORDER — ACCU-CHEK SOFTCLIX LANCETS MISC: 12 refills | Status: AC

## 2016-10-22 NOTE — Telephone Encounter (Signed)
Rx sents

## 2016-11-07 DIAGNOSIS — M170 Bilateral primary osteoarthritis of knee: Secondary | ICD-10-CM | POA: Diagnosis not present

## 2016-11-09 ENCOUNTER — Encounter: Payer: Self-pay | Admitting: Family Medicine

## 2016-11-18 DIAGNOSIS — M5416 Radiculopathy, lumbar region: Secondary | ICD-10-CM | POA: Diagnosis not present

## 2016-11-18 DIAGNOSIS — M5136 Other intervertebral disc degeneration, lumbar region: Secondary | ICD-10-CM | POA: Diagnosis not present

## 2016-11-18 DIAGNOSIS — M48061 Spinal stenosis, lumbar region without neurogenic claudication: Secondary | ICD-10-CM | POA: Diagnosis not present

## 2016-11-24 ENCOUNTER — Encounter: Payer: Self-pay | Admitting: Family Medicine

## 2016-11-24 ENCOUNTER — Telehealth: Payer: Self-pay | Admitting: Family Medicine

## 2016-11-24 ENCOUNTER — Ambulatory Visit (INDEPENDENT_AMBULATORY_CARE_PROVIDER_SITE_OTHER): Payer: Medicare Other | Admitting: Family Medicine

## 2016-11-24 DIAGNOSIS — J3489 Other specified disorders of nose and nasal sinuses: Secondary | ICD-10-CM

## 2016-11-24 MED ORDER — AMOXICILLIN-POT CLAVULANATE 875-125 MG PO TABS
1.0000 | ORAL_TABLET | Freq: Two times a day (BID) | ORAL | 0 refills | Status: DC
Start: 2016-11-24 — End: 2017-01-10

## 2016-11-24 NOTE — Progress Notes (Signed)
Pre visit review using our clinic review tool, if applicable. No additional management support is needed unless otherwise documented below in the visit note. 

## 2016-11-24 NOTE — Telephone Encounter (Signed)
Pt called and is c/o a sinus infection, wanted to come in for an appt. No available slots. Please advise, thank you!

## 2016-11-24 NOTE — Assessment & Plan Note (Signed)
New acute problem. Likely viral.  Discouraged antibiotic use. Patient adamant for antibiotics. Rx sent for Augmentin if he fails to improve or worsens (given history of recurrent sinusitis; cannot fill before 12/24).

## 2016-11-24 NOTE — Telephone Encounter (Signed)
Spoke with patient and advised him no available appointments . Advised him to go to urgent care or check with one of our other offices .

## 2016-11-24 NOTE — Patient Instructions (Signed)
This is likely viral.  There is no indication for antibiotics at this time.  Continue your current medication.  If you worsen, you can fill the augmentin over the holiday.  Take care  Dr. Lacinda Axon

## 2016-11-24 NOTE — Progress Notes (Signed)
Subjective:  Patient ID: Larry Ramus., male    DOB: 1946/03/29  Age: 70 y.o. MRN: [id number]  CC: Concern for Sinus infection  HPI:  70 year old male presents with the above complaints.  Patient reports he's been sick for the past 2 days. He's had frontal sinus pain and pressure as well as postnasal drip. No fevers or chills. No reports of purulent nasal discharge. He's been using over-the-counter medications with some improvement. No other associated symptoms. No other complaints or concerns at this time.  Social Hx   Social History   Social History  . Marital status: Married    Spouse name: N/A  . Number of children: 2  . Years of education: N/A   Occupational History  .  [employer]   Social History Main Topics  . Smoking status: Never Smoker  . Smokeless tobacco: Never Used     Comment: tobacco use - no  . Alcohol use 0.0 oz/week  . Drug use: No  . Sexual activity: Yes   Other Topics Concern  . None   Social History Narrative  . None   Review of Systems  Constitutional: Negative for fever.  HENT: Positive for postnasal drip and sinus pressure.    Objective:  BP 129/74   Pulse 84   Temp 98 F (36.7 C) (Oral)   Resp 12   Wt 223 lb 6 oz (101.3 kg)   SpO2 97%   BMI 30.30 kg/m   BP/Weight 11/24/2016 10/10/2016 Q000111Q  Systolic BP Q000111Q [id number]  Diastolic BP 74 78 84  Wt. (Lbs) 223.38 230.13 226.13  BMI 30.3 31.21 30.67   Physical Exam  Constitutional: He is oriented to person, place, and time. He appears well-developed. No distress.  HENT:  Head: Normocephalic and atraumatic.  Mouth/Throat: Oropharynx is clear and moist.  Frontal sinus tenderness to palpation.  Cardiovascular: Normal rate and regular rhythm.   Pulmonary/Chest: Effort normal and breath sounds normal.  Neurological: He is alert and oriented to person, place, and time.  Psychiatric: He has a normal mood and affect.  Vitals reviewed.  Lab Results  Component Value Date   WBC 6.2 07/23/2014   HGB 13.9 07/23/2014   HCT 41.3 07/23/2014   PLT 249.0 07/23/2014   GLUCOSE 134 (H) 10/10/2016   CHOL 147 05/16/2016   TRIG 177.0 (H) 05/16/2016   HDL 43.50 05/16/2016   LDLDIRECT 68.0 11/16/2015   LDLCALC 68 05/16/2016   ALT 21 10/10/2016   AST 17 10/10/2016   NA 139 10/10/2016   K 4.8 10/10/2016   CL 101 10/10/2016   CREATININE 0.80 10/10/2016   BUN 19 10/10/2016   CO2 31 10/10/2016   TSH 1.86 07/23/2014   PSA 0.43 01/14/2014   HGBA1C 6.1 10/10/2016   MICROALBUR 2.3 (H) 05/16/2016    Assessment & Plan:   Problem List Items Addressed This Visit    Sinus pressure    New acute problem. Likely viral.  Discouraged antibiotic use. Patient adamant for antibiotics. Rx sent for Augmentin if he fails to improve or worsens (given history of recurrent sinusitis; cannot fill before 12/24).         Meds ordered this encounter  Medications  . amoxicillin-clavulanate (AUGMENTIN) 875-125 MG tablet    Sig: Take 1 tablet by mouth 2 (two) times daily.    Dispense:  14 tablet    Refill:  0    Do not fill before 12/24. Trying to avoid need for antibiotic.  Follow-up: PRN  [place]

## 2016-12-27 ENCOUNTER — Encounter: Payer: Self-pay | Admitting: Family Medicine

## 2016-12-27 ENCOUNTER — Ambulatory Visit (INDEPENDENT_AMBULATORY_CARE_PROVIDER_SITE_OTHER): Payer: PPO | Admitting: Family Medicine

## 2016-12-27 DIAGNOSIS — B349 Viral infection, unspecified: Secondary | ICD-10-CM | POA: Insufficient documentation

## 2016-12-27 NOTE — Progress Notes (Signed)
Subjective:  Patient ID: Larry Roman., male    DOB: 10/09/1946  Age: 71 y.o. MRN: [id number]  CC: Nausea, diarrhea, headache, Fatigue, weakness  HPI:  71 year old male presents with the above complaints.  Patient states that his symptoms started on Friday. Started with sudden onset severe diarrhea. Then subsequently developed nausea, headache, fatigue, weakness. His wife gave him some Imodium with subsequent improvement in diarrhea. She's also given him an antiemetic with improvement in nausea. EMS was called on Saturday given severe diarrhea and associated lethargy. He was evaluated and did not go to the hospital. No associated fevers or chills. His wife has had the same illness as well. His diarrhea and nausea have now completely resolved. He had a small normal bowel movement recently. He is now complaining of severe fatigue and weakness. He is also having headache. No other complaints or concerns at this time.  Social Hx   Social History   Social History  . Marital status: Married    Spouse name: N/A  . Number of children: 2  . Years of education: N/A   Occupational History  .  [employer]   Social History Main Topics  . Smoking status: Never Smoker  . Smokeless tobacco: Never Used     Comment: tobacco use - no  . Alcohol use 0.0 oz/week  . Drug use: No  . Sexual activity: Yes   Other Topics Concern  . None   Social History Narrative  . None   Review of Systems  Constitutional: Positive for fatigue. Negative for fever.  Gastrointestinal: Positive for diarrhea and nausea.  Neurological: Positive for weakness.   Objective:  BP (!) 144/84   Pulse 67   Temp 98 F (36.7 C) (Oral)   Wt 214 lb 12.8 oz (97.4 kg)   SpO2 98%   BMI 29.13 kg/m   BP/Weight 12/27/2016 11/24/2016 [id number]  Systolic BP [id number] [id number]  Diastolic BP 84 74 78  Wt. (Lbs) 214.8 223.38 230.13  BMI 29.13 30.3 31.21   Physical Exam  Constitutional: He is oriented to person, place, and  time.  Appears fatigued. No acute distress.  HENT:  Mouth/Throat: Oropharynx is clear and moist.  Cardiovascular: Normal rate and regular rhythm.   Pulmonary/Chest: Effort normal and breath sounds normal.  Abdominal: Soft. He exhibits no distension. There is no tenderness. There is no rebound and no guarding.  Neurological: He is alert and oriented to person, place, and time.  Vitals reviewed.  Lab Results  Component Value Date   WBC 6.2 07/23/2014   HGB 13.9 07/23/2014   HCT 41.3 07/23/2014   PLT 249.0 07/23/2014   GLUCOSE 134 (H) 10/10/2016   CHOL 147 05/16/2016   TRIG 177.0 (H) 05/16/2016   HDL 43.50 05/16/2016   LDLDIRECT 68.0 11/16/2015   LDLCALC 68 05/16/2016   ALT 21 10/10/2016   AST 17 10/10/2016   NA 139 10/10/2016   K 4.8 10/10/2016   CL 101 10/10/2016   CREATININE 0.80 10/10/2016   BUN 19 10/10/2016   CO2 31 10/10/2016   TSH 1.86 07/23/2014   PSA 0.43 01/14/2014   HGBA1C 6.1 10/10/2016   MICROALBUR 2.3 (H) 05/16/2016    Assessment & Plan:   Problem List Items Addressed This Visit    Viral illness    New acute problem. Acute uncomplicated illness. Diarrhea and nausea is now resolved. Advised supportive care and over-the-counter Tylenol as needed. Slow introduction of normal foods. Aggressive hydration.  Follow-up: PRN  [place]

## 2016-12-27 NOTE — Assessment & Plan Note (Signed)
New acute problem. Acute uncomplicated illness. Diarrhea and nausea is now resolved. Advised supportive care and over-the-counter Tylenol as needed. Slow introduction of normal foods. Aggressive hydration.

## 2016-12-27 NOTE — Progress Notes (Signed)
Pre visit review using our clinic review tool, if applicable. No additional management support is needed unless otherwise documented below in the visit note. 

## 2016-12-28 ENCOUNTER — Ambulatory Visit: Payer: Medicare Other | Admitting: Family Medicine

## 2017-01-10 ENCOUNTER — Other Ambulatory Visit: Payer: Self-pay | Admitting: Family Medicine

## 2017-01-10 ENCOUNTER — Encounter: Payer: Self-pay | Admitting: Family Medicine

## 2017-01-10 ENCOUNTER — Ambulatory Visit (INDEPENDENT_AMBULATORY_CARE_PROVIDER_SITE_OTHER): Payer: PPO | Admitting: Family Medicine

## 2017-01-10 DIAGNOSIS — R0982 Postnasal drip: Secondary | ICD-10-CM | POA: Diagnosis not present

## 2017-01-10 DIAGNOSIS — N400 Enlarged prostate without lower urinary tract symptoms: Secondary | ICD-10-CM | POA: Insufficient documentation

## 2017-01-10 DIAGNOSIS — E119 Type 2 diabetes mellitus without complications: Secondary | ICD-10-CM

## 2017-01-10 DIAGNOSIS — G4700 Insomnia, unspecified: Secondary | ICD-10-CM | POA: Diagnosis not present

## 2017-01-10 DIAGNOSIS — I10 Essential (primary) hypertension: Secondary | ICD-10-CM

## 2017-01-10 MED ORDER — AMOXICILLIN-POT CLAVULANATE 875-125 MG PO TABS: 1.0000 | ORAL_TABLET | Freq: Two times a day (BID) | ORAL | 0 refills | Status: DC

## 2017-01-10 MED ORDER — TRAZODONE HCL 50 MG PO TABS: 50.0000 mg | ORAL_TABLET | Freq: Every evening | ORAL | 3 refills | Status: DC | PRN

## 2017-01-10 NOTE — Patient Instructions (Signed)
Try the trazodone.  Take the antibiotic. If it persists, we will arrange for you to see ENT.  Follow up in 3 months.  Take care  Dr. Lacinda Axon

## 2017-01-10 NOTE — Progress Notes (Signed)
Subjective:  Patient ID: Larry Ramus., male    DOB: September 13, 1946  Age: 71 y.o. MRN: [id number]  CC: Follow up, Complains of post nasal drip  HPI:  71 year old male with DM-2, HTN presents for follow-up. He also has complaints of postoperative drip.  DM-2  Blood sugars average in the 120s.  He is doing well on metformin.  Hypertension  Stable on Norvasc, Lasix, losartan.  Insomnia  Patient is been stable on Ambien.  He states that his insurance company is not covering it well. He's having to pay a lot of money. He would like to discuss an alternative.  Post nasal drip  2-3 week history of postnasal drip and associated cough.  He states that his cough is productive.  He has a history of chronic/recurrent sinus infections.  Been taking Alka-Seltzer and Mucinex with improvement.  However, he feels like his symptoms still linger.  No associated fevers or chills.  No other associated symptoms. No other complaint at this time.  Social Hx   Social History   Social History  . Marital status: Married    Spouse name: N/A  . Number of children: 2  . Years of education: N/A   Occupational History  .  [employer]   Social History Main Topics  . Smoking status: Never Smoker  . Smokeless tobacco: Never Used     Comment: tobacco use - no  . Alcohol use 0.0 oz/week  . Drug use: No  . Sexual activity: Yes   Other Topics Concern  . None   Social History Narrative  . None    Review of Systems  HENT: Positive for postnasal drip.   Respiratory: Positive for cough.    Objective:  BP 135/84   Pulse 83   Temp 98.1 F (36.7 C) (Oral)   Wt 227 lb 12.8 oz (103.3 kg)   SpO2 98%   BMI 30.90 kg/m   BP/Weight 01/10/2017 12/27/2016 Q000111Q  Systolic BP [id number] 123456 Q000111Q  Diastolic BP 84 84 74  Wt. (Lbs) 227.8 214.8 223.38  BMI 30.9 29.13 30.3   Physical Exam  Constitutional: He is oriented to person, place, and time. He appears well-developed. No distress.    HENT:  Mouth/Throat: Oropharynx is clear and moist.  Cardiovascular: Normal rate and regular rhythm.   Pulmonary/Chest: Effort normal and breath sounds normal.  Neurological: He is alert and oriented to person, place, and time.  Psychiatric: He has a normal mood and affect.  Vitals reviewed.  Lab Results  Component Value Date   WBC 6.2 07/23/2014   HGB 13.9 07/23/2014   HCT 41.3 07/23/2014   PLT 249.0 07/23/2014   GLUCOSE 134 (H) 10/10/2016   CHOL 147 05/16/2016   TRIG 177.0 (H) 05/16/2016   HDL 43.50 05/16/2016   LDLDIRECT 68.0 11/16/2015   LDLCALC 68 05/16/2016   ALT 21 10/10/2016   AST 17 10/10/2016   NA 139 10/10/2016   K 4.8 10/10/2016   CL 101 10/10/2016   CREATININE 0.80 10/10/2016   BUN 19 10/10/2016   CO2 31 10/10/2016   TSH 1.86 07/23/2014   PSA 0.43 01/14/2014   HGBA1C 6.1 10/10/2016   MICROALBUR 2.3 (H) 05/16/2016    Assessment & Plan:   Problem List Items Addressed This Visit    Diabetes mellitus type 2, controlled ([place])    Stable. Continue metformin. A1C at next visit.      Hypertension    Stable. Continue Norvasc, Lasix, Losartan.  Insomnia    Trial of trazodone.      Post-nasal drip    Patient with postnasal drip and productive cough. [place] for antibiotics (this is a recurring theme). Will treat with augmentin. If he continues to have issues, he will need to see ENT.         Meds ordered this encounter  Medications  . amoxicillin-clavulanate (AUGMENTIN) 875-125 MG tablet    Sig: Take 1 tablet by mouth 2 (two) times daily.    Dispense:  14 tablet    Refill:  0  . traZODone (DESYREL) 50 MG tablet    Sig: Take 1 tablet (50 mg total) by mouth at bedtime as needed for sleep.    Dispense:  30 tablet    Refill:  3    Follow-up: Return in about 3 months (around 04/09/2017).  [place]

## 2017-01-10 NOTE — Assessment & Plan Note (Signed)
Patient with postnasal drip and productive cough. Larry Roman for antibiotics (this is a recurring theme). Will treat with augmentin. If he continues to have issues, he will need to see ENT.

## 2017-01-10 NOTE — Assessment & Plan Note (Signed)
Stable. Continue metformin. A1C at next visit.

## 2017-01-10 NOTE — Assessment & Plan Note (Signed)
Stable. Continue Norvasc, Lasix, Losartan.

## 2017-01-10 NOTE — Assessment & Plan Note (Signed)
Trial of trazodone. 

## 2017-01-10 NOTE — Progress Notes (Signed)
Pre visit review using our clinic review tool, if applicable. No additional management support is needed unless otherwise documented below in the visit note. 

## 2017-01-16 ENCOUNTER — Encounter: Payer: Self-pay | Admitting: Family Medicine

## 2017-01-18 ENCOUNTER — Other Ambulatory Visit: Payer: Self-pay | Admitting: Family Medicine

## 2017-01-18 MED ORDER — GLUCOSE BLOOD VI STRP: ORAL_STRIP | 12 refills | Status: DC

## 2017-01-22 DIAGNOSIS — J019 Acute sinusitis, unspecified: Secondary | ICD-10-CM | POA: Diagnosis not present

## 2017-02-16 ENCOUNTER — Ambulatory Visit: Payer: Medicare Other | Admitting: Family Medicine

## 2017-02-21 ENCOUNTER — Other Ambulatory Visit: Payer: Self-pay | Admitting: Family Medicine

## 2017-02-21 MED ORDER — AMLODIPINE BESYLATE 10 MG PO TABS: 10.0000 mg | ORAL_TABLET | Freq: Every day | ORAL | 2 refills | Status: DC

## 2017-03-01 DIAGNOSIS — M170 Bilateral primary osteoarthritis of knee: Secondary | ICD-10-CM | POA: Diagnosis not present

## 2017-03-15 ENCOUNTER — Other Ambulatory Visit: Payer: Self-pay | Admitting: Family Medicine

## 2017-03-15 MED ORDER — FUROSEMIDE 20 MG PO TABS
20.0000 mg | ORAL_TABLET | Freq: Every day | ORAL | 3 refills | Status: DC
Start: 2017-03-15 — End: 2018-01-17

## 2017-03-15 NOTE — Telephone Encounter (Signed)
Refilled: 01/27/16 Last OV: 01/10/17 Last Labs: 10/10/16 Future OV:  04/10/17 Please advise?

## 2017-03-24 DIAGNOSIS — M4726 Other spondylosis with radiculopathy, lumbar region: Secondary | ICD-10-CM | POA: Diagnosis not present

## 2017-03-24 DIAGNOSIS — M5416 Radiculopathy, lumbar region: Secondary | ICD-10-CM | POA: Diagnosis not present

## 2017-04-10 ENCOUNTER — Ambulatory Visit: Payer: PPO | Admitting: Family Medicine

## 2017-04-11 ENCOUNTER — Other Ambulatory Visit: Payer: Self-pay | Admitting: Family Medicine

## 2017-04-21 ENCOUNTER — Encounter: Payer: Self-pay | Admitting: Family Medicine

## 2017-04-21 ENCOUNTER — Ambulatory Visit (INDEPENDENT_AMBULATORY_CARE_PROVIDER_SITE_OTHER): Payer: PPO | Admitting: Family Medicine

## 2017-04-21 VITALS — BP 166/76 | HR 97 | Temp 99.8°F | Ht 72.0 in | Wt 222.4 lb

## 2017-04-21 DIAGNOSIS — J329 Chronic sinusitis, unspecified: Secondary | ICD-10-CM | POA: Insufficient documentation

## 2017-04-21 MED ORDER — AMOXICILLIN-POT CLAVULANATE 875-125 MG PO TABS: 1.0000 | ORAL_TABLET | Freq: Two times a day (BID) | ORAL | 0 refills | Status: DC

## 2017-04-21 NOTE — Patient Instructions (Signed)
This is likely viral and/or allergic.  Give it some time.  If you fail to improve, you can start the antibiotic.  Consider seeing ENT again given your frequent bouts of this.  Take care  Dr. Lacinda Axon

## 2017-04-21 NOTE — Progress Notes (Signed)
Subjective:  Patient ID: Gardiner Ramus., male    DOB: 08/24/1946  Age: 71 y.o. MRN: [id number]  CC: Concern for sinusitis  HPI:  71 year old male presents with the above complaint.  Patient reports that he's been sick since Monday. He states that he's had sinus congestion and pressure, postnasal drip, ear pain. Reported low-grade subjective fever at home. No purulent nasal discharge. No dental pain. He's been taking Alka-Seltzer with no improvement. He has a history of recurrent sinusitis. No known exacerbating factors. No other associated symptoms. No other complaints this time.  Social Hx   Social History   Social History  . Marital status: Married    Spouse name: N/A  . Number of children: 2  . Years of education: N/A   Occupational History  .  [employer]   Social History Main Topics  . Smoking status: Never Smoker  . Smokeless tobacco: Never Used     Comment: tobacco use - no  . Alcohol use 0.0 oz/week  . Drug use: No  . Sexual activity: Yes   Other Topics Concern  . None   Social History Narrative  . None    Review of Systems  Constitutional: Negative for fever.  HENT: Positive for congestion, ear pain and sinus pressure.    Objective:  BP (!) 166/76   Pulse 97   Temp 99.8 F (37.7 C) (Oral)   Ht 6' (1.829 m)   Wt 222 lb 6 oz (100.9 kg)   SpO2 97%   BMI 30.16 kg/m   BP/Weight 04/21/2017 01/10/2017 6/38/4665  Systolic BP 993 570 177  Diastolic BP 76 84 84  Wt. (Lbs) 222.38 227.8 214.8  BMI 30.16 30.9 29.13    Physical Exam  Constitutional: He is oriented to person, place, and time. He appears well-developed. No distress.  HENT:  Head: Normocephalic and atraumatic.  Mouth/Throat: Oropharynx is clear and moist.  Normal TM's.  Eyes: Conjunctivae are normal.  Neck: Neck supple.  Cardiovascular: Normal rate and regular rhythm.   Pulmonary/Chest: Effort normal and breath sounds normal.  Lymphadenopathy:    He has no cervical adenopathy.    Neurological: He is alert and oriented to person, place, and time.  Psychiatric: He has a normal mood and affect.  Vitals reviewed.  Lab Results  Component Value Date   WBC 6.2 07/23/2014   HGB 13.9 07/23/2014   HCT 41.3 07/23/2014   PLT 249.0 07/23/2014   GLUCOSE 134 (H) 10/10/2016   CHOL 147 05/16/2016   TRIG 177.0 (H) 05/16/2016   HDL 43.50 05/16/2016   LDLDIRECT 68.0 11/16/2015   LDLCALC 68 05/16/2016   ALT 21 10/10/2016   AST 17 10/10/2016   NA 139 10/10/2016   K 4.8 10/10/2016   CL 101 10/10/2016   CREATININE 0.80 10/10/2016   BUN 19 10/10/2016   CO2 31 10/10/2016   TSH 1.86 07/23/2014   PSA 0.43 01/14/2014   HGBA1C 6.1 10/10/2016   MICROALBUR 2.3 (H) 05/16/2016    Assessment & Plan:   Problem List Items Addressed This Visit    Chronic sinusitis - Primary    His symptoms and presentation is likely viral in origin. Augmentin if he fails to improve or worsens. Patient needs to see ENT. He      Relevant Medications   amoxicillin-clavulanate (AUGMENTIN) 875-125 MG tablet      Meds ordered this encounter  Medications  . amoxicillin-clavulanate (AUGMENTIN) 875-125 MG tablet    Sig: Take 1 tablet by  mouth 2 (two) times daily.    Dispense:  14 tablet    Refill:  0   Follow-up: PRN  [place]

## 2017-04-21 NOTE — Assessment & Plan Note (Signed)
His symptoms and presentation is likely viral in origin. Augmentin if he fails to improve or worsens. Patient needs to see ENT. He

## 2017-04-24 ENCOUNTER — Ambulatory Visit (INDEPENDENT_AMBULATORY_CARE_PROVIDER_SITE_OTHER): Payer: PPO

## 2017-04-24 VITALS — BP 122/72 | HR 76 | Temp 98.6°F | Resp 14 | Ht 72.0 in | Wt 222.0 lb

## 2017-04-24 DIAGNOSIS — Z0000 Encounter for general adult medical examination without abnormal findings: Secondary | ICD-10-CM

## 2017-04-24 NOTE — Progress Notes (Signed)
Subjective:   Larry Cosma. is a 71 y.o. male who presents for Medicare Annual/Subsequent preventive examination.  Review of Systems:  No ROS.  Medicare Wellness Visit.  Cardiac Risk Factors include: advanced age (>7men, >54 women);hypertension;diabetes mellitus;male gender;obesity (BMI >30kg/m2)     Objective:    Vitals: BP 122/72 (BP Location: Left Arm, Patient Position: Sitting, Cuff Size: Normal)   Pulse 76   Temp 98.6 F (37 C) (Oral)   Resp 14   Ht 6' (1.829 m)   Wt 222 lb (100.7 kg)   SpO2 95%   BMI 30.11 kg/m   Body mass index is 30.11 kg/m.  Tobacco History  Smoking Status  . Never Smoker  Smokeless Tobacco  . Never Used    Comment: tobacco use - no     Counseling given: Not Answered   Past Medical History:  Diagnosis Date  . Depression   . DM (diabetes mellitus) ([place])   . Extrinsic asthma, unspecified    childhood  . HTN (hypertension)   . Other allergy, other than to medicinal agents    Past Surgical History:  Procedure Laterality Date  . BACK SURGERY    . CATARACT EXTRACTION, BILATERAL  2016   Dr. Kerman Passey at [facility]  . TONSILLECTOMY     Family History  Problem Relation Age of Onset  . Heart failure Father   . Heart attack Mother    History  Sexual Activity  . Sexual activity: Yes    Outpatient Encounter Prescriptions as of 04/24/2017  Medication Sig  . ACCU-CHEK SOFTCLIX LANCETS lancets Use up to 4 times daily to check blood sugar. Diagnosis E11.9  . amLODipine (NORVASC) 10 MG tablet Take 1 tablet (10 mg total) by mouth daily.  Marland Kitchen amoxicillin-clavulanate (AUGMENTIN) 875-125 MG tablet Take 1 tablet by mouth 2 (two) times daily.  Marland Kitchen aspirin EC 81 MG tablet Take 81 mg by mouth daily.    Marland Kitchen azelastine (ASTELIN) 0.1 % nasal spray USE 1 SPRAY IN EACH NOSTRIL TWICE DAILY AS DIRECTED  . Cholecalciferol (VITAMIN D3) 2000 UNITS TABS Take by mouth.  . Cyanocobalamin (B-12) 5000 MCG SUBL Place under the tongue.  . cyclobenzaprine  (FLEXERIL) 10 MG tablet Take 1 tablet (10 mg total) by mouth 3 (three) times daily as needed.  . DULoxetine (CYMBALTA) 30 MG capsule Take 1 capsule (30 mg total) by mouth daily.  . furosemide (LASIX) 20 MG tablet Take 1 tablet (20 mg total) by mouth daily.  Marland Kitchen glucose blood test strip Use as instructed to check blood up to 3 times daily. E11.9  . ketoconazole (NIZORAL) 2 % cream Apply topically daily.  Marland Kitchen loratadine (CLARITIN) 10 MG tablet Take 10 mg by mouth daily.  Marland Kitchen losartan (COZAAR) 100 MG tablet TAKE 1 TABLET (100 MG TOTAL) BY MOUTH DAILY.  . metFORMIN (GLUCOPHAGE) 500 MG tablet TAKE 1 TABLET (500 MG TOTAL) BY MOUTH 2 (TWO) TIMES DAILY WITH A MEAL.  . ranitidine (ZANTAC) 75 MG tablet Take 75 mg by mouth daily as needed for heartburn.  . tamsulosin (FLOMAX) 0.4 MG CAPS capsule TAKE 1 CAPSULE (0.4 MG TOTAL) BY MOUTH DAILY.  . traZODone (DESYREL) 50 MG tablet Take 1 tablet (50 mg total) by mouth at bedtime as needed for sleep.   No facility-administered encounter medications on file as of 04/24/2017.     Activities of Daily Living In your present state of health, do you have any difficulty performing the following activities: 04/24/2017  Hearing? N  Vision? N  Difficulty concentrating or making decisions? N  Walking or climbing stairs? N  Dressing or bathing? N  Doing errands, shopping? N  Preparing Food and eating ? N  Using the Toilet? N  In the past six months, have you accidently leaked urine? N  Do you have problems with loss of bowel control? N  Managing your Medications? N  Managing your Finances? N  Some recent data might be hidden    Patient Care Team: Coral Spikes, DO as PCP - General (Family Medicine)   Assessment:    This is a routine wellness examination for [facility]. The goal of the wellness visit is to assist the patient how to close the gaps in care and create a preventative care plan for the patient.   Taking calcium VIT D3 as appropriate/Osteoporosis risk  reviewed.  Medications reviewed; taking without issues or barriers.  Safety issues reviewed; smoke detectors in the home. Firearms locked up in the home. Wears seatbelts when driving or riding with others. Patient does wear sunscreen or protective clothing when in direct sunlight. No violence in the home.  Depression- PHQ 2 &9 complete.  No signs/symptoms or verbal communication regarding little pleasure in doing things, feeling down, depressed or hopeless. No changes in sleeping, energy, eating, concentrating.  No thoughts of self harm or harm towards others.  Time spent on this topic is 8 minutes.   Patient is alert, normal appearance, oriented to person/place/and time. Correctly identified the president of the [place], recall of 3/3 words, and performing simple calculations.  Patient displays appropriate judgement and can read correct time from watch face.  No new identified risk were noted.  No failures at ADL's or IADL's.   BMI- discussed the importance of a healthy diet, water intake and exercise. Educational material provided.   24 hour diet recall: Breakfast: Oatmeal Lunch: Pimento cheese sandwich Dinner: [employer], green vegetable Daily fluid intake: 1cups of caffeine, 4 cups of water  HTN- followed by PCP.  Dental- every six months. Dr.Minor.  Eye- Visual acuity not assessed per patient preference since they have regular follow up with the ophthalmologist.  Wears corrective lenses.  Sleep patterns- Sleeps 7-8 hours at night. Naps during the day.  Hepatitis C Screening discussed. Educational material provided.  Patient Concerns: None at this time. Follow up with PCP as needed.  Exercise Activities and Dietary recommendations Current Exercise Habits: Home exercise routine, Time (Minutes): 20, Frequency (Times/Week): 5, Weekly Exercise (Minutes/Week): 100, Intensity: Mild  Goals    . Healthy Lifestyle          Stay hydrated and drink plenty of fluids. Low carb foods.   Lean meats and vegetables. Stay active and continue walking for exercise.  Increase as tolerated.      Fall Risk Fall Risk  04/24/2017 05/19/2016 04/22/2016 02/12/2015 01/14/2014  Falls in the past year? Yes No No No No  Number falls in past yr: 2 or more - - - -  Injury with Fall? No - - - -  Follow up Falls prevention discussed - - - -   Depression Screen PHQ 2/9 Scores 04/24/2017 05/19/2016 04/22/2016 02/12/2015  PHQ - 2 Score 0 0 0 0  PHQ- 9 Score 0 - - -    Cognitive Function MMSE - Mini Mental State Exam 04/24/2017 04/22/2016  Orientation to time 5 5  Orientation to Place 5 5  Registration 3 3  Attention/ Calculation 5 5  Recall 3 3  Language- name 2  objects 2 2  Language- repeat 1 1  Language- follow 3 step command 3 3  Language- read & follow direction 1 1  Write a sentence 1 1  Copy design 1 1  Total score 30 30        Immunization History  Administered Date(s) Administered  . Influenza Split 11/10/2011, 09/10/2012  . Influenza,inj,Quad PF,36+ Mos 10/02/2013, 08/30/2014, 09/14/2015, 08/25/2016  . Pneumococcal Conjugate-13 01/14/2014  . Pneumococcal Polysaccharide-23 07/16/2012  . Tdap 11/10/2011  . Varicella 11/16/2011  . Zoster 12/01/2011   Screening Tests Health Maintenance  Topic Date Due  . Hepatitis C Screening  1946-10-22  . FOOT EXAM  05/17/2016  . HEMOGLOBIN A1C  04/09/2017  . OPHTHALMOLOGY EXAM  05/11/2017  . INFLUENZA VACCINE  07/05/2017  . COLONOSCOPY  11/15/2020  . TETANUS/TDAP  11/09/2021  . PNA vac Low Risk Adult  Completed      Plan:    End of life planning; Advance aging; Advanced directives discussed. Copy of current HCPOA/Living Will requested.    I have personally reviewed and noted the following in the patient's chart:   . Medical and social history . Use of alcohol, tobacco or illicit drugs  . Current medications and supplements . Functional ability and status . Nutritional status . Physical activity . Advanced  directives . List of other physicians . Hospitalizations, surgeries, and ER visits in previous 12 months . Vitals . Screenings to include cognitive, depression, and falls . Referrals and appointments  In addition, I have reviewed and discussed with patient certain preventive protocols, quality metrics, and best practice recommendations. A written personalized care plan for preventive services as well as general preventive health recommendations were provided to patient.     Varney Biles, LPN  6/76/7209

## 2017-04-24 NOTE — Patient Instructions (Addendum)
[name] , Thank you for taking time to come for your Medicare Wellness Visit. I appreciate your ongoing commitment to your health goals. Please review the following plan we discussed and let me know if I can assist you in the future.   Follow up with Dr. Lacinda Axon as needed.    Bring a copy of your [place] and/or Living Will to be scanned into chart.  Consider Hepatitis C Screening.  Educational material provided.  Have a great day!  These are the goals we discussed: Goals    . Healthy Lifestyle          Stay hydrated and drink plenty of fluids. Low carb foods.  Lean meats and vegetables. Stay active and continue walking for exercise.  Increase as tolerated.       This is a list of the screening recommended for you and due dates:  Health Maintenance  Topic Date Due  .  Hepatitis C: One time screening is recommended by Center for Disease Control  (CDC) for  adults born from [birth year] through 1964.   09-08-1946  . Complete foot exam   05/17/2016  . Hemoglobin A1C  04/09/2017  . Eye exam for diabetics  05/11/2017  . Flu Shot  07/05/2017  . Colon Cancer Screening  11/15/2020  . Tetanus Vaccine  11/09/2021  . Pneumonia vaccines  Completed      Fall Prevention in the Home Falls can cause injuries. They can happen to people of all ages. There are many things you can do to make your home safe and to help prevent falls. What can I do on the outside of my home?  Regularly fix the edges of walkways and driveways and fix any cracks.  Remove anything that might make you trip as you walk through a door, such as a raised step or threshold.  Trim any bushes or trees on the path to your home.  Use bright outdoor lighting.  Clear any walking paths of anything that might make someone trip, such as rocks or tools.  Regularly check to see if handrails are loose or broken. Make sure that both sides of any steps have handrails.  Any raised decks and porches should have  guardrails on the edges.  Have any leaves, snow, or ice cleared regularly.  Use sand or salt on walking paths during winter.  Clean up any spills in your garage right away. This includes oil or grease spills. What can I do in the bathroom?  Use night lights.  Install grab bars by the toilet and in the tub and shower. Do not use towel bars as grab bars.  Use non-skid mats or decals in the tub or shower.  If you need to sit down in the shower, use a plastic, non-slip stool.  Keep the floor dry. Clean up any water that spills on the floor as soon as it happens.  Remove soap buildup in the tub or shower regularly.  Attach bath mats securely with double-sided non-slip rug tape.  Do not have throw rugs and other things on the floor that can make you trip. What can I do in the bedroom?  Use night lights.  Make sure that you have a light by your bed that is easy to reach.  Do not use any sheets or blankets that are too big for your bed. They should not hang down onto the floor.  Have a firm chair that has side arms. You can use this  for support while you get dressed.  Do not have throw rugs and other things on the floor that can make you trip. What can I do in the kitchen?  Clean up any spills right away.  Avoid walking on wet floors.  Keep items that you use a lot in easy-to-reach places.  If you need to reach something above you, use a strong step stool that has a grab bar.  Keep electrical cords out of the way.  Do not use floor polish or wax that makes floors slippery. If you must use wax, use non-skid floor wax.  Do not have throw rugs and other things on the floor that can make you trip. What can I do with my stairs?  Do not leave any items on the stairs.  Make sure that there are handrails on both sides of the stairs and use them. Fix handrails that are broken or loose. Make sure that handrails are as long as the stairways.  Check any carpeting to make sure that  it is firmly attached to the stairs. Fix any carpet that is loose or worn.  Avoid having throw rugs at the top or bottom of the stairs. If you do have throw rugs, attach them to the floor with carpet tape.  Make sure that you have a light switch at the top of the stairs and the bottom of the stairs. If you do not have them, ask someone to add them for you. What else can I do to help prevent falls?  Wear shoes that:  Do not have high heels.  Have rubber bottoms.  Are comfortable and fit you well.  Are closed at the toe. Do not wear sandals.  If you use a stepladder:  Make sure that it is fully opened. Do not climb a closed stepladder.  Make sure that both sides of the stepladder are locked into place.  Ask someone to hold it for you, if possible.  Clearly mark and make sure that you can see:  Any grab bars or handrails.  First and last steps.  Where the edge of each step is.  Use tools that help you move around (mobility aids) if they are needed. These include:  Canes.  Walkers.  Scooters.  Crutches.  Turn on the lights when you go into a dark area. Replace any light bulbs as soon as they burn out.  Set up your furniture so you have a clear path. Avoid moving your furniture around.  If any of your floors are uneven, fix them.  If there are any pets around you, be aware of where they are.  Review your medicines with your doctor. Some medicines can make you feel dizzy. This can increase your chance of falling. Ask your doctor what other things that you can do to help prevent falls. This information is not intended to replace advice given to you by your health care provider. Make sure you discuss any questions you have with your health care provider. Document Released: 09/17/2009 Document Revised: 04/28/2016 Document Reviewed: 12/26/2014 Elsevier Interactive Patient Education  2017 [employer].

## 2017-05-10 ENCOUNTER — Ambulatory Visit (INDEPENDENT_AMBULATORY_CARE_PROVIDER_SITE_OTHER): Payer: PPO | Admitting: Family

## 2017-05-10 ENCOUNTER — Encounter: Payer: Self-pay | Admitting: Family

## 2017-05-10 VITALS — BP 120/60 | HR 78 | Temp 98.7°F | Ht 72.0 in | Wt 222.6 lb

## 2017-05-10 DIAGNOSIS — I10 Essential (primary) hypertension: Secondary | ICD-10-CM | POA: Diagnosis not present

## 2017-05-10 DIAGNOSIS — Z136 Encounter for screening for cardiovascular disorders: Secondary | ICD-10-CM | POA: Diagnosis not present

## 2017-05-10 DIAGNOSIS — G4700 Insomnia, unspecified: Secondary | ICD-10-CM | POA: Diagnosis not present

## 2017-05-10 DIAGNOSIS — F339 Major depressive disorder, recurrent, unspecified: Secondary | ICD-10-CM | POA: Diagnosis not present

## 2017-05-10 DIAGNOSIS — E119 Type 2 diabetes mellitus without complications: Secondary | ICD-10-CM | POA: Diagnosis not present

## 2017-05-10 DIAGNOSIS — Z125 Encounter for screening for malignant neoplasm of prostate: Secondary | ICD-10-CM

## 2017-05-10 DIAGNOSIS — Z1159 Encounter for screening for other viral diseases: Secondary | ICD-10-CM

## 2017-05-10 DIAGNOSIS — N400 Enlarged prostate without lower urinary tract symptoms: Secondary | ICD-10-CM

## 2017-05-10 NOTE — Patient Instructions (Signed)
Fasting labs  Follow up with physical

## 2017-05-10 NOTE — Progress Notes (Signed)
Subjective:    Patient ID: Larry Roman., male    DOB: Aug 15, 1946, 71 y.o.   MRN: [id number]  CC: Larry Roman. is a 71 y.o. male who presents today for follow up.   HPI: DM- compliant 500mg  BID. Fasting BS 115 on average  HTN- ON norvasc, lasix,  Losartan. No LE swelling. No CP with exercise. Denies exertional chest pain or pressure, numbness or tingling radiating to left arm or jaw, palpitations, dizziness, frequent headaches, changes in vision, or shortness of breath.   [place] 2013- stress test normal per patient.   No h/o CHF.   Depression- cymbalta for years. Stable  Insomnia- stable.  Trazodone works well.   Urticaria - on  Zantac daily. stable.   BPH- stable, on flomax. Had checked PSA in the past. No urologist.   Colonoscopy 2011; repeat 10 years.     HISTORY:  Past Medical History:  Diagnosis Date  . Depression   . DM (diabetes mellitus) ([place])   . Extrinsic asthma, unspecified    childhood  . HTN (hypertension)   . Other allergy, other than to medicinal agents    Past Surgical History:  Procedure Laterality Date  . BACK SURGERY    . CATARACT EXTRACTION, BILATERAL  2016   Dr. Kerman Passey at [facility]  . TONSILLECTOMY     Family History  Problem Relation Age of Onset  . Heart failure Father   . Heart attack Mother     Allergies: Patient has no known allergies. Current Outpatient Prescriptions on File Prior to Visit  Medication Sig Dispense Refill  . ACCU-CHEK SOFTCLIX LANCETS lancets Use up to 4 times daily to check blood sugar. Diagnosis E11.9 100 each 12  . amLODipine (NORVASC) 10 MG tablet Take 1 tablet (10 mg total) by mouth daily. 90 tablet 2  . amoxicillin-clavulanate (AUGMENTIN) 875-125 MG tablet Take 1 tablet by mouth 2 (two) times daily. 14 tablet 0  . aspirin EC 81 MG tablet Take 81 mg by mouth daily.      Marland Kitchen azelastine (ASTELIN) 0.1 % nasal spray USE 1 SPRAY IN EACH NOSTRIL TWICE DAILY AS DIRECTED 30 mL 3  . Cholecalciferol  (VITAMIN D3) 2000 UNITS TABS Take by mouth.    . Cyanocobalamin (B-12) 5000 MCG SUBL Place under the tongue.    . cyclobenzaprine (FLEXERIL) 10 MG tablet Take 1 tablet (10 mg total) by mouth 3 (three) times daily as needed. 90 tablet 3  . DULoxetine (CYMBALTA) 30 MG capsule Take 1 capsule (30 mg total) by mouth daily. 90 capsule 3  . furosemide (LASIX) 20 MG tablet Take 1 tablet (20 mg total) by mouth daily. 90 tablet 3  . glucose blood test strip Use as instructed to check blood up to 3 times daily. E11.9 100 each 12  . ketoconazole (NIZORAL) 2 % cream Apply topically daily. 30 g 2  . loratadine (CLARITIN) 10 MG tablet Take 10 mg by mouth daily.    Marland Kitchen losartan (COZAAR) 100 MG tablet TAKE 1 TABLET (100 MG TOTAL) BY MOUTH DAILY. 90 tablet 3  . metFORMIN (GLUCOPHAGE) 500 MG tablet TAKE 1 TABLET (500 MG TOTAL) BY MOUTH 2 (TWO) TIMES DAILY WITH A MEAL. 180 tablet 3  . ranitidine (ZANTAC) 75 MG tablet Take 75 mg by mouth daily as needed for heartburn.    . tamsulosin (FLOMAX) 0.4 MG CAPS capsule TAKE 1 CAPSULE (0.4 MG TOTAL) BY MOUTH DAILY. 90 capsule 1  . traZODone (DESYREL) 50 MG  tablet Take 1 tablet (50 mg total) by mouth at bedtime as needed for sleep. 30 tablet 3   No current facility-administered medications on file prior to visit.     Social History  Substance Use Topics  . Smoking status: Never Smoker  . Smokeless tobacco: Never Used     Comment: tobacco use - no  . Alcohol use 0.0 oz/week     Comment: rare    Review of Systems  Constitutional: Negative for chills and fever.  Respiratory: Negative for cough.   Cardiovascular: Negative for chest pain and palpitations.  Gastrointestinal: Negative for nausea and vomiting.  Genitourinary: Positive for difficulty urinating. Negative for decreased urine volume and dysuria.  Psychiatric/Behavioral: Negative for sleep disturbance.      Objective:    BP 120/60   Pulse 78   Temp 98.7 F (37.1 C) (Oral)   Ht 6' (1.829 m)   Wt 222 lb  9.6 oz (101 kg)   SpO2 96%   BMI 30.19 kg/m  BP Readings from Last 3 Encounters:  05/10/17 120/60  04/24/17 122/72  04/21/17 (!) 166/76   Wt Readings from Last 3 Encounters:  05/10/17 222 lb 9.6 oz (101 kg)  04/24/17 222 lb (100.7 kg)  04/21/17 222 lb 6 oz (100.9 kg)    Physical Exam  Constitutional: He appears well-developed and well-nourished.  Cardiovascular: Regular rhythm and normal heart sounds.   Pulmonary/Chest: Effort normal and breath sounds normal. No respiratory distress. He has no wheezes. He has no rhonchi. He has no rales.  Neurological: He is alert.  Skin: Skin is warm and dry.  Psychiatric: He has a normal mood and affect. His speech is normal and behavior is normal.  Vitals reviewed.      Assessment & Plan:   Problem List Items Addressed This Visit      Cardiovascular and Mediastinum   Hypertension - Primary    At goal. Will continue current regimen. Pending BMP.      Relevant Orders   Comprehensive metabolic panel     Endocrine   Diabetes mellitus type 2, controlled ([place])    Pending A1c.      Relevant Orders   Hemoglobin A1c     Genitourinary   BPH (benign prostatic hyperplasia)    Doing well on Flomax. Advised patient have prostate exam with me during physical. Pending PSA        Other   Depression, recurrent ([place])    Doing well on Cymbalta. We'll continue      Insomnia    Stable. Doing well on trazodone. We'll continue       Other Visit Diagnoses    Screening for prostate cancer       Relevant Orders   PSA   Encounter for hepatitis C screening test for low risk patient       Relevant Orders   Hepatitis C antibody   Screening for cardiovascular condition       Relevant Orders   Lipid panel       I am having [name] maintain his aspirin EC, ranitidine, loratadine, Vitamin D3, B-12, cyclobenzaprine, losartan, DULoxetine, metFORMIN, azelastine, ketoconazole, ACCU-CHEK SOFTCLIX LANCETS, traZODone, glucose blood, amLODipine,  furosemide, tamsulosin, and amoxicillin-clavulanate.   No orders of the defined types were placed in this encounter.   Return precautions given.   Risks, benefits, and alternatives of the medications and treatment plan prescribed today were discussed, and patient expressed understanding.   Education regarding symptom management and diagnosis given to  patient on AVS.  Continue to follow with Burnard Hawthorne, FNP for routine health maintenance.   [place] agreed with plan.   Mable Paris, FNP

## 2017-05-11 NOTE — Assessment & Plan Note (Signed)
Stable. Doing well on trazodone. We'll continue

## 2017-05-11 NOTE — Assessment & Plan Note (Signed)
At goal. Will continue current regimen. Pending BMP.

## 2017-05-11 NOTE — Assessment & Plan Note (Signed)
Doing well on Flomax. Advised patient have prostate exam with me during physical. Pending PSA

## 2017-05-11 NOTE — Assessment & Plan Note (Signed)
Doing well on Cymbalta. We'll continue

## 2017-05-11 NOTE — Assessment & Plan Note (Signed)
Pending A1c 

## 2017-05-12 ENCOUNTER — Other Ambulatory Visit: Payer: Self-pay

## 2017-05-12 MED ORDER — TRAZODONE HCL 50 MG PO TABS: 50.0000 mg | ORAL_TABLET | Freq: Every evening | ORAL | 3 refills | Status: DC | PRN

## 2017-05-17 ENCOUNTER — Other Ambulatory Visit: Payer: Self-pay

## 2017-05-17 MED ORDER — DULOXETINE HCL 30 MG PO CPEP: 30.0000 mg | ORAL_CAPSULE | Freq: Every day | ORAL | 3 refills | Status: DC

## 2017-05-19 ENCOUNTER — Encounter: Payer: Self-pay | Admitting: Family

## 2017-05-19 ENCOUNTER — Other Ambulatory Visit: Payer: Self-pay

## 2017-05-19 MED ORDER — LOSARTAN POTASSIUM 100 MG PO TABS: ORAL_TABLET | ORAL | 3 refills | Status: DC

## 2017-05-19 NOTE — Telephone Encounter (Signed)
Medication has been refilled.

## 2017-05-22 ENCOUNTER — Other Ambulatory Visit: Payer: Self-pay | Admitting: Family

## 2017-05-22 ENCOUNTER — Other Ambulatory Visit: Payer: Self-pay | Admitting: Radiology

## 2017-05-24 ENCOUNTER — Other Ambulatory Visit (INDEPENDENT_AMBULATORY_CARE_PROVIDER_SITE_OTHER): Payer: PPO

## 2017-05-24 ENCOUNTER — Telehealth: Payer: Self-pay

## 2017-05-24 DIAGNOSIS — Z125 Encounter for screening for malignant neoplasm of prostate: Secondary | ICD-10-CM | POA: Diagnosis not present

## 2017-05-24 DIAGNOSIS — E119 Type 2 diabetes mellitus without complications: Secondary | ICD-10-CM | POA: Diagnosis not present

## 2017-05-24 DIAGNOSIS — I10 Essential (primary) hypertension: Secondary | ICD-10-CM

## 2017-05-24 DIAGNOSIS — Z136 Encounter for screening for cardiovascular disorders: Secondary | ICD-10-CM

## 2017-05-24 DIAGNOSIS — F339 Major depressive disorder, recurrent, unspecified: Secondary | ICD-10-CM

## 2017-05-24 LAB — LIPID PANEL
Cholesterol: 161 mg/dL (ref 0–200)
HDL: 51.5 mg/dL (ref >39.00)
LDL CALC: 87 mg/dL (ref 0–99)
NONHDL: 109.57
Total CHOL/HDL Ratio: 3
Triglycerides: 114 mg/dL (ref 0.0–149.0)
VLDL: 22.8 mg/dL (ref 0.0–40.0)

## 2017-05-24 LAB — COMPREHENSIVE METABOLIC PANEL
ALBUMIN: 4.7 g/dL (ref 3.5–5.2)
ALT: 18 U/L (ref 0–53)
AST: 15 U/L (ref 0–37)
Alkaline Phosphatase: 60 U/L (ref 39–117)
BUN: 12 mg/dL (ref 6–23)
CHLORIDE: 101 meq/L (ref 96–112)
CO2: 29 meq/L (ref 19–32)
CREATININE: 0.79 mg/dL (ref 0.40–1.50)
Calcium: 9.8 mg/dL (ref 8.4–10.5)
GFR: 103.1 mL/min (ref >60.00)
Glucose, Bld: 135 mg/dL — ABNORMAL HIGH (ref 70–99)
Potassium: 4 mEq/L (ref 3.5–5.1)
SODIUM: 140 meq/L (ref 135–145)
Total Bilirubin: 1 mg/dL (ref 0.2–1.2)
Total Protein: 6.9 g/dL (ref 6.0–8.3)

## 2017-05-24 LAB — PSA: PSA: 0.69 ng/mL (ref 0.10–4.00)

## 2017-05-24 LAB — HEMOGLOBIN A1C: Hgb A1c MFr Bld: 6.8 % — ABNORMAL HIGH (ref 4.6–6.5)

## 2017-05-24 NOTE — Telephone Encounter (Signed)
Fax received to have trazodone changed to 90 day supply. Last given on 05/12/17

## 2017-05-25 ENCOUNTER — Other Ambulatory Visit: Payer: Self-pay | Admitting: Family

## 2017-05-25 DIAGNOSIS — E785 Hyperlipidemia, unspecified: Secondary | ICD-10-CM

## 2017-05-25 MED ORDER — PRAVASTATIN SODIUM 40 MG PO TABS: 40.0000 mg | ORAL_TABLET | Freq: Every day | ORAL | 2 refills | Status: DC

## 2017-05-25 MED ORDER — TRAZODONE HCL 50 MG PO TABS: 50.0000 mg | ORAL_TABLET | Freq: Every evening | ORAL | 1 refills | Status: DC | PRN

## 2017-05-25 NOTE — Telephone Encounter (Signed)
done

## 2017-05-25 NOTE — Addendum Note (Signed)
Addended by: Burnard Hawthorne on: 05/25/2017 08:09 AM   Modules accepted: Orders

## 2017-06-08 ENCOUNTER — Other Ambulatory Visit: Payer: Self-pay

## 2017-06-08 MED ORDER — METFORMIN HCL 500 MG PO TABS: ORAL_TABLET | ORAL | 3 refills | Status: DC

## 2017-06-08 NOTE — Telephone Encounter (Signed)
Medication has been refilled.

## 2017-06-12 ENCOUNTER — Other Ambulatory Visit: Payer: Self-pay

## 2017-06-12 NOTE — Patient Outreach (Signed)
     This RNCM was successful in making contact with patient for this HTA Screening Call. Patient was very pleasant and agreeable to assessment. Patient reports being able to afford co payments, has no issues with housing or food. Patient states his hgA1C is currently 6.5 and blood pressure is controlled at 130s.  Patient agreed to have Larry Roman Program literature to be sent for his use if case management issues arise.

## 2017-06-15 ENCOUNTER — Encounter: Payer: Self-pay | Admitting: Family

## 2017-06-15 ENCOUNTER — Ambulatory Visit (INDEPENDENT_AMBULATORY_CARE_PROVIDER_SITE_OTHER): Payer: PPO | Admitting: Family

## 2017-06-15 DIAGNOSIS — E119 Type 2 diabetes mellitus without complications: Secondary | ICD-10-CM | POA: Diagnosis not present

## 2017-06-15 DIAGNOSIS — I10 Essential (primary) hypertension: Secondary | ICD-10-CM | POA: Diagnosis not present

## 2017-06-15 NOTE — Progress Notes (Signed)
Subjective:    Patient ID: Larry Roman., male    DOB: 11-02-1946, 71 y.o.   MRN: [id number]  CC: Larry Roman. is a 71 y.o. male who presents today for follow up.   HPI: Here to discuss labs  HTN- compliant. Doesn't check blood pressure at home.  Has had back pain this morning and  Think contributing to blood pressure elevation. Denies exertional chest pain or pressure, numbness or tingling radiating to left arm or jaw, palpitations, dizziness, frequent headaches, changes in vision, or shortness of breath.    DM- Compliant with medication. Approaching retirement and plans to focus more on exercise. eye exam is later this month          HISTORY:  Past Medical History:  Diagnosis Date  . Depression   . DM (diabetes mellitus) ([place])   . Extrinsic asthma, unspecified    childhood  . HTN (hypertension)   . Other allergy, other than to medicinal agents    Past Surgical History:  Procedure Laterality Date  . BACK SURGERY    . CATARACT EXTRACTION, BILATERAL  2016   Dr[facility] Kerman [facility] at [facility]  . TONSILLECTOMY     Family History  Problem Relation Age of Onset  . Heart failure Father   . Heart attack Mother     Allergies: Patient has no known allergies. Current Outpatient Prescriptions on File Prior to Visit  Medication Sig Dispense Refill  . ACCU-CHEK SOFTCLIX LANCETS lancets Use up to 4 times daily to check blood sugar. Diagnosis E11.9 100 each 12  . amLODipine (NORVASC) 10 MG tablet Take 1 tablet (10 mg total) by mouth daily. 90 tablet 2  . aspirin EC 81 MG tablet Take 81 mg by mouth daily.      Marland Kitchen azelastine (ASTELIN) 0.1 % nasal spray USE 1 SPRAY IN EACH NOSTRIL TWICE DAILY AS DIRECTED 30 mL 3  . Cholecalciferol (VITAMIN D3) 2000 UNITS TABS Take by mouth.    . Cyanocobalamin (B-12) 5000 MCG SUBL Place under the tongue.    . cyclobenzaprine (FLEXERIL) 10 MG tablet Take 1 tablet (10 mg total) by mouth 3 (three) times daily as needed. 90 tablet 3  .  DULoxetine (CYMBALTA) 30 MG capsule Take 1 capsule (30 mg total) by mouth daily. 90 capsule 3  . furosemide (LASIX) 20 MG tablet Take 1 tablet (20 mg total) by mouth daily. 90 tablet 3  . glucose blood test strip Use as instructed to check blood up to 3 times daily. E11.9 100 each 12  . ketoconazole (NIZORAL) 2 % cream Apply topically daily. 30 g 2  . loratadine (CLARITIN) 10 MG tablet Take 10 mg by mouth daily.    Marland Kitchen losartan (COZAAR) 100 MG tablet TAKE 1 TABLET (100 MG TOTAL) BY MOUTH DAILY. 90 tablet 3  . metFORMIN (GLUCOPHAGE) 500 MG tablet TAKE 1 TABLET (500 MG TOTAL) BY MOUTH 2 (TWO) TIMES DAILY WITH A MEAL. 180 tablet 3  . pravastatin (PRAVACHOL) 40 MG tablet Take 1 tablet (40 mg total) by mouth daily. 90 tablet 2  . ranitidine (ZANTAC) 75 MG tablet Take 75 mg by mouth daily as needed for heartburn.    . tamsulosin (FLOMAX) 0.4 MG CAPS capsule TAKE 1 CAPSULE (0.4 MG TOTAL) BY MOUTH DAILY. 90 capsule 1  . traZODone (DESYREL) 50 MG tablet Take 1 tablet (50 mg total) by mouth at bedtime as needed for sleep. 90 tablet 1   No current facility-administered medications on file  prior to visit.     Social History  Substance Use Topics  . Smoking status: Never Smoker  . Smokeless tobacco: Never Used     Comment: tobacco use - no  . Alcohol use 0.0 oz/week     Comment: rare    Review of Systems  Constitutional: Negative for chills and fever.  Respiratory: Negative for cough.   Cardiovascular: Negative for chest pain and palpitations.  Gastrointestinal: Negative for nausea and vomiting.      Objective:    BP 140/85   Pulse 83   Temp 98.3 F (36.8 C) (Oral)   Ht 6' (1.829 m)   Wt 223 lb 12.8 oz (101.5 kg)   SpO2 96%   BMI 30.35 kg/m  BP Readings from Last 3 Encounters:  06/15/17 140/85  05/10/17 120/60  04/24/17 122/72   Wt Readings from Last 3 Encounters:  06/15/17 223 lb 12.8 oz (101.5 kg)  05/10/17 222 lb 9.6 oz (101 kg)  04/24/17 222 lb (100.7 kg)    Physical Exam    Constitutional: He appears well-developed and well-nourished.  Cardiovascular: Regular rhythm and normal heart sounds.   Pulmonary/Chest: Effort normal and breath sounds normal. No respiratory distress. He has no wheezes. He has no rhonchi. He has no rales.  Neurological: He is alert.  Skin: Skin is warm and dry.  Psychiatric: He has a normal mood and affect. His speech is normal and behavior is normal.  Vitals reviewed.      Assessment & Plan:   Problem List Items Addressed This Visit      Cardiovascular and Mediastinum   Hypertension    Generally well controlled. Suspect slightly elevated today due to aggravated low back pain. Advised patient to increase exercise to help lower these numbers and follow low salt diet.        Endocrine   Diabetes mellitus type 2, controlled ([place])    A1c not quite at goal. Discussed by lifestyle modifications for this. We'll continue to monitor          I have discontinued [name]'s amoxicillin-clavulanate. I am also having him maintain his aspirin EC, ranitidine, loratadine, Vitamin D3, B-12, cyclobenzaprine, azelastine, ketoconazole, ACCU-CHEK SOFTCLIX LANCETS, glucose blood, amLODipine, furosemide, tamsulosin, DULoxetine, losartan, traZODone, pravastatin, and metFORMIN.   No orders of the defined types were placed in this encounter.   Return precautions given.   Risks, benefits, and alternatives of the medications and treatment plan prescribed today were discussed, and patient expressed understanding.   Education regarding symptom management and diagnosis given to patient on AVS.  Continue to follow with Burnard Hawthorne, FNP for routine health maintenance.   [place] agreed with plan.   Mable Paris, FNP

## 2017-06-15 NOTE — Assessment & Plan Note (Signed)
Generally well controlled. Suspect slightly elevated today due to aggravated low back pain. Advised patient to increase exercise to help lower these numbers and follow low salt diet.

## 2017-06-15 NOTE — Patient Instructions (Addendum)
Change email address at front desk.   Silver sneakers program at [employer].  Best way to lower blood sugars and blood pressure since you are so close...  EXERCISE :)    Follow up 3-6 months

## 2017-06-15 NOTE — Assessment & Plan Note (Signed)
A1c not quite at goal. Discussed by lifestyle modifications for this. We'll continue to monitor

## 2017-06-20 ENCOUNTER — Encounter: Payer: Self-pay | Admitting: Family

## 2017-06-26 ENCOUNTER — Telehealth: Payer: Self-pay | Admitting: Family

## 2017-06-26 NOTE — Telephone Encounter (Signed)
Call pt  Does he need a referral back to [place]? ( back surgeon?)  [facility] gave him tramadol 03/2017 with 60 tabs. I would feel more comfortable if his office refilled since I have never evaluated him for back pain.

## 2017-06-26 NOTE — Telephone Encounter (Signed)
Left message for patient to return call back.  

## 2017-06-28 DIAGNOSIS — E119 Type 2 diabetes mellitus without complications: Secondary | ICD-10-CM | POA: Diagnosis not present

## 2017-06-28 NOTE — Telephone Encounter (Signed)
Patient has been notified

## 2017-06-29 DIAGNOSIS — M170 Bilateral primary osteoarthritis of knee: Secondary | ICD-10-CM | POA: Diagnosis not present

## 2017-08-18 DIAGNOSIS — M48061 Spinal stenosis, lumbar region without neurogenic claudication: Secondary | ICD-10-CM | POA: Diagnosis not present

## 2017-08-18 DIAGNOSIS — M4726 Other spondylosis with radiculopathy, lumbar region: Secondary | ICD-10-CM | POA: Diagnosis not present

## 2017-08-18 DIAGNOSIS — M5416 Radiculopathy, lumbar region: Secondary | ICD-10-CM | POA: Diagnosis not present

## 2017-08-18 DIAGNOSIS — M5116 Intervertebral disc disorders with radiculopathy, lumbar region: Secondary | ICD-10-CM | POA: Diagnosis not present

## 2017-09-11 ENCOUNTER — Ambulatory Visit (INDEPENDENT_AMBULATORY_CARE_PROVIDER_SITE_OTHER): Payer: PPO

## 2017-09-11 DIAGNOSIS — Z23 Encounter for immunization: Secondary | ICD-10-CM | POA: Diagnosis not present

## 2017-10-02 ENCOUNTER — Other Ambulatory Visit: Payer: Self-pay | Admitting: Family Medicine

## 2017-10-02 DIAGNOSIS — M170 Bilateral primary osteoarthritis of knee: Secondary | ICD-10-CM | POA: Diagnosis not present

## 2017-10-23 ENCOUNTER — Ambulatory Visit: Payer: PPO | Admitting: Family Medicine

## 2017-10-23 ENCOUNTER — Encounter: Payer: Self-pay | Admitting: Family Medicine

## 2017-10-23 VITALS — BP 132/70 | HR 89 | Temp 99.0°F | Ht 72.0 in | Wt 221.0 lb

## 2017-10-23 DIAGNOSIS — J324 Chronic pansinusitis: Secondary | ICD-10-CM

## 2017-10-23 MED ORDER — AMOXICILLIN-POT CLAVULANATE 875-125 MG PO TABS: 1.0000 | ORAL_TABLET | Freq: Two times a day (BID) | ORAL | 0 refills | Status: DC

## 2017-10-23 NOTE — Progress Notes (Signed)
Patient ID: Larry Roman., male   DOB: 1946/01/26, 71 y.o.   MRN: [id number]  PCP: Burnard Hawthorne, FNP  Subjective:  Larry Roman. is a 71 y.o. year old very pleasant male patient who presents with symptoms including nasal congestion, sinus pressure/pain , cough that is minimally productive of yellow sputum -started: 5 to 6 days ago symptoms are not improving -previous treatments: Mucinex and Alka Seltzer plus has provided limited benefit -sick contacts/travel/risks: denies flu exposure. Influenza vaccine is UTD -Hx of: HTN; chronic sinusitis No recent antibiotic use or recent sick contact exposure   ROS-denies fever, SOB, NVD, tooth pain  Pertinent Past Medical History- Diabetes T2; well controlled.  Medications- reviewed  Current Outpatient Medications  Medication Sig Dispense Refill  . ACCU-CHEK SOFTCLIX LANCETS lancets Use up to 4 times daily to check blood sugar. Diagnosis E11.9 100 each 12  . amLODipine (NORVASC) 10 MG tablet Take 1 tablet (10 mg total) by mouth daily. 90 tablet 2  . aspirin EC 81 MG tablet Take 81 mg by mouth daily.      Marland Kitchen azelastine (ASTELIN) 0.1 % nasal spray USE 1 SPRAY IN EACH NOSTRIL TWICE DAILY AS DIRECTED 30 mL 3  . Cholecalciferol (VITAMIN D3) 2000 UNITS TABS Take by mouth.    . Cyanocobalamin (B-12) 5000 MCG SUBL Place under the tongue.    . cyclobenzaprine (FLEXERIL) 10 MG tablet Take 1 tablet (10 mg total) by mouth 3 (three) times daily as needed. 90 tablet 3  . DULoxetine (CYMBALTA) 30 MG capsule Take 1 capsule (30 mg total) by mouth daily. 90 capsule 3  . furosemide (LASIX) 20 MG tablet Take 1 tablet (20 mg total) by mouth daily. 90 tablet 3  . glucose blood test strip Use as instructed to check blood up to 3 times daily. E11.9 100 each 12  . ketoconazole (NIZORAL) 2 % cream Apply topically daily. 30 g 2  . loratadine (CLARITIN) 10 MG tablet Take 10 mg by mouth daily.    Marland Kitchen losartan (COZAAR) 100 MG tablet TAKE 1 TABLET (100 MG TOTAL) BY  MOUTH DAILY. 90 tablet 3  . metFORMIN (GLUCOPHAGE) 500 MG tablet TAKE 1 TABLET (500 MG TOTAL) BY MOUTH 2 (TWO) TIMES DAILY WITH A MEAL. 180 tablet 3  . pravastatin (PRAVACHOL) 40 MG tablet Take 1 tablet (40 mg total) by mouth daily. 90 tablet 2  . ranitidine (ZANTAC) 75 MG tablet Take 75 mg by mouth daily as needed for heartburn.    . tamsulosin (FLOMAX) 0.4 MG CAPS capsule TAKE 1 CAPSULE BY MOUTH EVERY DAY 90 capsule 1  . traZODone (DESYREL) 50 MG tablet Take 1 tablet (50 mg total) by mouth at bedtime as needed for sleep. 90 tablet 1   No current facility-administered medications for this visit.     Objective: BP 132/70   Pulse 89   Temp 99 F (37.2 C)   Ht 6' (1.829 m)   Wt 221 lb (100.2 kg)   SpO2 98%   BMI 29.97 kg/m  Gen: NAD, resting comfortably HEENT: Turbinates erythematous, TM normal, pharynx mildly erythematous with no tonsilar exudate or edema, +maxillary and frontal sinus tenderness CV: RRR no murmurs rubs or gallops Lungs: CTAB no crackles, wheeze, rhonchi Abdomen: soft/nontender/nondistended/normal bowel sounds. No rebound or guarding.  Ext: no edema Skin: warm, dry, no rash Neuro: grossly normal, moves all extremities  Assessment/Plan:  1. Chronic pansinusitis Symptom duration and history of chronic sinusitis and diabetes. Will initiate empiric treatment with  Augmentin and advised patient on supportive measures:  Get rest, drink plenty of fluids, and use tylenol  as needed for pain. Follow up if fever >100, if symptoms worsen or if symptoms are not improved in 3 to 4 days.  Also advised patient that if symptoms do not improve and with history of chronic sinusitis; ENT will be considered. Patient verbalizes understanding.    Finally, we reviewed reasons to return to care including if symptoms worsen or persist or new concerns arise- once again particularly shortness of breath or fever.   Larry Quint, FNP

## 2017-10-23 NOTE — Patient Instructions (Signed)
It was a pleasure to see you today!  Please drink plenty of water enough for your urine to be pale yellow or clear. You may use Tylenol 325 mg every 6 hours as needed. Mucinex DM can be used for cough. Follow-up for evaluation if your symptoms do not improve in 3-4 days, worsen, or you develop a fever greater than 100.   Sinusitis, Adult Sinusitis is soreness and inflammation of your sinuses. Sinuses are hollow spaces in the bones around your face. They are located:  Around your eyes.  In the middle of your forehead.  Behind your nose.  In your cheekbones.  Your sinuses and nasal passages are lined with a stringy fluid (mucus). Mucus normally drains out of your sinuses. When your nasal tissues get inflamed or swollen, the mucus can get trapped or blocked so air cannot flow through your sinuses. This lets bacteria, viruses, and funguses grow, and that leads to infection. Follow these instructions at home: Medicines  Take, use, or apply over-the-counter and prescription medicines only as told by your doctor. These may include nasal sprays.  If you were prescribed an antibiotic medicine, take it as told by your doctor. Do not stop taking the antibiotic even if you start to feel better. Hydrate and Humidify  Drink enough water to keep your pee (urine) clear or pale yellow.  Use a cool mist humidifier to keep the humidity level in your home above 50%.  Breathe in steam for 10-15 minutes, 3-4 times a day or as told by your doctor. You can do this in the bathroom while a hot shower is running.  Try not to spend time in cool or dry air. Rest  Rest as much as possible.  Sleep with your head raised (elevated).  Make sure to get enough sleep each night. General instructions  Put a warm, moist washcloth on your face 3-4 times a day or as told by your doctor. This will help with discomfort.  Wash your hands often with soap and water. If there is no soap and water, use hand  sanitizer.  Do not smoke. Avoid being around people who are smoking (secondhand smoke).  Keep all follow-up visits as told by your doctor. This is important. Contact a doctor if:  You have a fever.  Your symptoms get worse.  Your symptoms do not get better within 10 days. Get help right away if:  You have a very bad headache.  You cannot stop throwing up (vomiting).  You have pain or swelling around your face or eyes.  You have trouble seeing.  You feel confused.  Your neck is stiff.  You have trouble breathing. This information is not intended to replace advice given to you by your health care provider. Make sure you discuss any questions you have with your health care provider. Document Released: 05/09/2008 Document Revised: 07/17/2016 Document Reviewed: 09/16/2015 Elsevier Interactive Patient Education  [employer].

## 2017-11-10 ENCOUNTER — Ambulatory Visit: Payer: PPO | Admitting: Family Medicine

## 2017-11-10 ENCOUNTER — Encounter: Payer: Self-pay | Admitting: Family Medicine

## 2017-11-10 VITALS — BP 140/72 | HR 89 | Temp 98.6°F | Resp 18 | Ht 72.0 in | Wt 225.0 lb

## 2017-11-10 DIAGNOSIS — M545 Low back pain, unspecified: Secondary | ICD-10-CM

## 2017-11-10 LAB — POCT URINALYSIS DIPSTICK
Bilirubin, UA: NEGATIVE
Glucose, UA: NEGATIVE
KETONES UA: NEGATIVE
Leukocytes, UA: NEGATIVE
Nitrite, UA: NEGATIVE
PROTEIN UA: NEGATIVE
SPEC GRAV UA: 1.01 (ref 1.010–1.025)
UROBILINOGEN UA: 0.2 U/dL
pH, UA: 5.5 (ref 5.0–8.0)

## 2017-11-10 LAB — URINALYSIS, MICROSCOPIC ONLY

## 2017-11-10 MED ORDER — HYDROCODONE-ACETAMINOPHEN 5-325 MG PO TABS: 1.0000 | ORAL_TABLET | Freq: Four times a day (QID) | ORAL | 0 refills | Status: DC | PRN

## 2017-11-10 MED ORDER — BACLOFEN 10 MG PO TABS: 10.0000 mg | ORAL_TABLET | Freq: Three times a day (TID) | ORAL | 0 refills | Status: DC | PRN

## 2017-11-10 NOTE — Progress Notes (Signed)
Patient came in withh complaint of severe back pain on left side just below the rib cage radiating from mid spine to posterior left side, patient rated  Pain 9.5 started on 11/09/17 around 4 pm. Nothing eases or relieves comes in spasms.

## 2017-11-10 NOTE — Patient Instructions (Addendum)
Nice to meet you. Your back pain is likely related to muscle spasm though with a trace amount of blood in kidney stone.  We will look at the microscopic view and if there is a significant amount of blood on it we will treat you for kidney stone. You can take the muscle relaxer for your pain. If it appears that you have a kidney stone you should fill the Percocet prescription.  We will call you to let you know regarding this.  Please be careful as both the muscle relaxer and the Percocet could make you drowsy. If there is minimal blood on microscopy we will treat you with a oral steroid and the muscle relaxer. If you develop worsening pain, numbness, weakness, loss of bowel or bladder function, blood in your urine, or uncontrollable pain please be evaluated immediately.

## 2017-11-10 NOTE — Assessment & Plan Note (Signed)
Differential is musculoskeletal versus nephrolithiasis.  Suspect more likely to be musculoskeletal given tenderness and spasm in lumbar musculature though there was trace blood on urine dipstick.  Will send urine for culture and microscopy.  Discussed if there were increased RBCs on microscopy we would treat as a kidney stone and if there were not increased RBCs on microscopy we would treat with prednisone for musculoskeletal cause.  Patient is already on Flomax.  Vicodin prescribed for pain management.  Muscle relaxer as well for spasm.  Discussed if pain is uncontrollable he should be evaluated in the emergency room for imaging to determine if he has a renal stone.  Given return precautions.

## 2017-11-10 NOTE — Progress Notes (Signed)
Larry Rumps, MD Phone: [phone]  [name] Kyi Romanello. is a 71 y.o. male who presents today for same-day visit.  Patient notes starting yesterday around 4 PM he was sitting in his recliner and felt like he was getting a cramp in his left low back.  It progressively got worse and is fairly severe at this point.  It comes in spasms.  He did go to the gym yesterday though had no issues while he was at the gym.  He notes no radiation.  No numbness or weakness.  No loss of bowel or bladder function.  No saddle anesthesia.  No urinary symptoms.  No hematuria.  No history of kidney stone though his wife wonders if it could be a kidney stone.  He has a chronic history of low back pain and does get injections in his back.  He is not ever had back pain this severe.  He took 2 Percocet last night with some benefit.  He also took Tylenol.  Social History   Tobacco Use  Smoking Status Never Smoker  Smokeless Tobacco Never Used  Tobacco Comment   tobacco use - no     ROS see history of present illness  Objective  Physical Exam Vitals:   11/10/17 0822 11/10/17 0854  BP: (!) 168/88 140/72  Pulse: 89   Resp: 18   Temp: 98.6 F (37 C)   SpO2: 98%     BP Readings from Last 3 Encounters:  11/10/17 140/72  10/23/17 132/70  06/15/17 140/85   Wt Readings from Last 3 Encounters:  11/10/17 225 lb (102.1 kg)  10/23/17 221 lb (100.2 kg)  06/15/17 223 lb 12.8 oz (101.5 kg)    Physical Exam  Constitutional: No distress.  Cardiovascular: Normal rate, regular rhythm and normal heart sounds.  2+ DP and PT pulses bilaterally, feet are warm and well-perfused  Pulmonary/Chest: Effort normal and breath sounds normal.  Musculoskeletal: He exhibits no edema.  No midline spine tenderness, no midline spine step-off, there is left lower back muscular tenderness and spasm palpated  Neurological: He is alert. Gait normal.  5/5 strength bilateral quads, hamstrings, plantar flexion, and dorsiflexion,  sensation light touch intact bilateral lower extremities  Skin: Skin is warm and dry. He is not diaphoretic.  No CVA tenderness   Assessment/Plan: Please see individual problem list.  Acute left-sided low back pain without sciatica Differential is musculoskeletal versus nephrolithiasis.  Suspect more likely to be musculoskeletal given tenderness and spasm in lumbar musculature though there was trace blood on urine dipstick.  Will send urine for culture and microscopy.  Discussed if there were increased RBCs on microscopy we would treat as a kidney stone and if there were not increased RBCs on microscopy we would treat with prednisone for musculoskeletal cause.  Patient is already on Flomax.  Vicodin prescribed for pain management.  Muscle relaxer as well for spasm.  Discussed if pain is uncontrollable he should be evaluated in the emergency room for imaging to determine if he has a renal stone.  Given return precautions.   Kron was seen today for back pain.  Diagnoses and all orders for this visit:  Acute left-sided low back pain without sciatica -     POCT Urinalysis Dipstick -     Urine Culture -     Urine Microscopic Only  Other orders -     HYDROcodone-acetaminophen (NORCO/VICODIN) 5-325 MG tablet; Take 1 tablet by mouth every 6 (six) hours as needed for moderate pain. -  baclofen (LIORESAL) 10 MG tablet; Take 1 tablet (10 mg total) by mouth 3 (three) times daily as needed for muscle spasms.    Orders Placed This Encounter  Procedures  . Urine Culture  . Urine Microscopic Only  . POCT Urinalysis Dipstick    Meds ordered this encounter  Medications  . HYDROcodone-acetaminophen (NORCO/VICODIN) 5-325 MG tablet    Sig: Take 1 tablet by mouth every 6 (six) hours as needed for moderate pain.    Dispense:  8 tablet    Refill:  0  . baclofen (LIORESAL) 10 MG tablet    Sig: Take 1 tablet (10 mg total) by mouth 3 (three) times daily as needed for muscle spasms.    Dispense:  20  each    Refill:  0     Larry Rumps, MD [place]

## 2017-11-11 LAB — URINE CULTURE
MICRO NUMBER:: 81379438
SPECIMEN QUALITY: ADEQUATE

## 2017-11-15 NOTE — Progress Notes (Signed)
Patient states he believes he must have passed it after his office visit.He states he is not having any more pain

## 2017-11-17 ENCOUNTER — Other Ambulatory Visit: Payer: Self-pay | Admitting: Family Medicine

## 2017-12-15 DIAGNOSIS — B353 Tinea pedis: Secondary | ICD-10-CM | POA: Diagnosis not present

## 2017-12-15 DIAGNOSIS — L578 Other skin changes due to chronic exposure to nonionizing radiation: Secondary | ICD-10-CM | POA: Diagnosis not present

## 2017-12-15 DIAGNOSIS — B351 Tinea unguium: Secondary | ICD-10-CM | POA: Diagnosis not present

## 2017-12-18 ENCOUNTER — Ambulatory Visit: Payer: PPO | Admitting: Family

## 2017-12-29 ENCOUNTER — Other Ambulatory Visit: Payer: Self-pay

## 2017-12-29 DIAGNOSIS — F339 Major depressive disorder, recurrent, unspecified: Secondary | ICD-10-CM

## 2017-12-29 MED ORDER — TRAZODONE HCL 50 MG PO TABS: 50.0000 mg | ORAL_TABLET | Freq: Every evening | ORAL | 1 refills | Status: DC | PRN

## 2018-01-09 DIAGNOSIS — M170 Bilateral primary osteoarthritis of knee: Secondary | ICD-10-CM | POA: Diagnosis not present

## 2018-01-17 ENCOUNTER — Encounter: Payer: Self-pay | Admitting: Family

## 2018-01-17 ENCOUNTER — Ambulatory Visit (INDEPENDENT_AMBULATORY_CARE_PROVIDER_SITE_OTHER): Payer: PPO | Admitting: Family

## 2018-01-17 VITALS — BP 150/86 | HR 66 | Temp 98.7°F | Wt 225.4 lb

## 2018-01-17 DIAGNOSIS — I10 Essential (primary) hypertension: Secondary | ICD-10-CM | POA: Diagnosis not present

## 2018-01-17 DIAGNOSIS — R251 Tremor, unspecified: Secondary | ICD-10-CM | POA: Diagnosis not present

## 2018-01-17 DIAGNOSIS — I4510 Unspecified right bundle-branch block: Secondary | ICD-10-CM | POA: Diagnosis not present

## 2018-01-17 LAB — COMPREHENSIVE METABOLIC PANEL
ALT: 16 U/L (ref 0–53)
AST: 13 U/L (ref 0–37)
Albumin: 4.5 g/dL (ref 3.5–5.2)
Alkaline Phosphatase: 62 U/L (ref 39–117)
BILIRUBIN TOTAL: 0.9 mg/dL (ref 0.2–1.2)
BUN: 17 mg/dL (ref 6–23)
CO2: 31 meq/L (ref 19–32)
Calcium: 9.7 mg/dL (ref 8.4–10.5)
Chloride: 100 mEq/L (ref 96–112)
Creatinine, Ser: 0.85 mg/dL (ref 0.40–1.50)
GFR: 94.57 mL/min (ref >60.00)
GLUCOSE: 109 mg/dL — AB (ref 70–99)
Potassium: 4 mEq/L (ref 3.5–5.1)
Sodium: 139 mEq/L (ref 135–145)
Total Protein: 7.2 g/dL (ref 6.0–8.3)

## 2018-01-17 LAB — B12 AND FOLATE PANEL: FOLATE: 12.1 ng/mL (ref >5.9)

## 2018-01-17 LAB — MAGNESIUM: MAGNESIUM: 2.1 mg/dL (ref 1.5–2.5)

## 2018-01-17 LAB — HEMOGLOBIN A1C: Hgb A1c MFr Bld: 6.8 % — ABNORMAL HIGH (ref 4.6–6.5)

## 2018-01-17 LAB — MICROALBUMIN / CREATININE URINE RATIO
Creatinine,U: 94.8 mg/dL
MICROALB UR: 11 mg/dL — AB (ref 0.0–1.9)
MICROALB/CREAT RATIO: 11.6 mg/g (ref 0.0–30.0)

## 2018-01-17 LAB — TSH: TSH: 1.64 u[IU]/mL (ref 0.35–4.50)

## 2018-01-17 MED ORDER — HYDROCHLOROTHIAZIDE 12.5 MG PO CAPS: 12.5000 mg | ORAL_CAPSULE | Freq: Every day | ORAL | 1 refills | Status: DC

## 2018-01-17 NOTE — Patient Instructions (Addendum)
  Labs today and then come back on Monday to check electrolytes.   STOP lasix. Start HCTZ   Monitor blood pressure,  Goal is less than 130/80; if persistently higher, please make sooner follow up appointment so we can recheck you blood pressure and manage medications  We will start with lab work for tremor- if worsens or new symptoms develop, let me know immediately as that would warrant neurology consult. Watch for triggers.   Tremor A tremor is trembling or shaking that you cannot control. Most tremors affect the hands or arms. Tremors can also affect the head, vocal cords, face, and other parts of the body. There are many types of tremors. Common types include:  Essential tremor. These usually occur in people over the age of 68. It may run in families and can happen in otherwise healthy people.  Resting tremor. These occur when the muscles are at rest, such as when your hands are resting in your lap. People with Parkinson disease often have resting tremors.  Postural tremor. These occur when you try to hold a pose, such as keeping your hands outstretched.  Kinetic tremor. These occur during purposeful movement, such as trying to touch a finger to your nose.  Task-specific tremor. These may occur when you perform tasks such as handwriting, speaking, or standing.  Psychogenic tremor. These dramatically lessen or disappear when you are distracted. They can happen in people of all ages.  Some types of tremors have no known cause. Tremors can also be a symptom of nervous system problems (neurological disorders) that may occur with aging. Some tremors go away with treatment while others do not. Follow these instructions at home: Watch your tremor for any changes. The following actions may help to lessen any discomfort you are feeling:  Take medicines only as directed by your health care provider.  Limit alcohol intake to no more than 1 drink per day for nonpregnant women and 2 drinks per  day for men. One drink equals 12 oz of beer, 5 oz of wine, or 1 oz of hard liquor.  Do not use any tobacco products, including cigarettes, chewing tobacco, or electronic cigarettes. If you need help quitting, ask your health care provider.  Avoid extreme heat or cold.  Limit the amount of caffeine you consumeas directed by your health care provider.  Try to get 8 hours of sleep each night.  Find ways to manage your stress, such as meditation or yoga.  Keep all follow-up visits as directed by your health care provider. This is important.  Contact a health care provider if:  You start having a tremor after starting a new medicine.  You have tremor with other symptoms such as: ? Numbness. ? Tingling. ? Pain. ? Weakness.  Your tremor gets worse.  Your tremor interferes with your day-to-day life. This information is not intended to replace advice given to you by your health care provider. Make sure you discuss any questions you have with your health care provider. Document Released: 11/11/2002 Document Revised: 07/24/2016 Document Reviewed: 05/19/2014 Elsevier Interactive Patient Education  [employer].

## 2018-01-17 NOTE — Progress Notes (Signed)
Subjective:    Patient ID: Larry Ramus., male    DOB: 05/19/1946, 72 y.o.   MRN: [id number]  CC: Larry Roman. is a 72 y.o. male who presents today for follow up.   HPI: Feeling well. No complaints.   HTN- compliant with medications. Not checking BP at home.  Exercises at [employer] twice per week. Unsure why on lasix.   RBBB- had seen dr Rockey Situ in the past; per patient stress test normal. Denies exertional chest pain or pressure, numbness or tingling radiating to left arm or jaw, palpitations, dizziness, frequent headaches, changes in vision, or shortness of breath.   Started on diflucan for nail fungus, 2 months ago,  plans to take for a year. Noticed swelling in feet 'at first'. Worse at end of day, improves when elevated. Resolved now.   No pain in calf, recent travel/immobilization, sob.   Has noticed a tremor in right hand for 6 months, intermittent. 'mild' . Notices when holding glasst. Not affecting writing. Notices more so when 'getting hungry or blood sugar is low.' Not worsening. No family h/o PD   Echo 2009 Normal EF. No known h/o HF. No chronic edema.      HISTORY:  Past Medical History:  Diagnosis Date  . Depression   . DM (diabetes mellitus) ([place])   . Extrinsic asthma, unspecified    childhood  . HTN (hypertension)   . Other allergy, other than to medicinal agents    Past Surgical History:  Procedure Laterality Date  . BACK SURGERY    . CATARACT EXTRACTION, BILATERAL  2016   Dr. Kerman Passey [facility]  . TONSILLECTOMY     Family History  Problem Relation Age of Onset  . Heart failure Father   . Heart attack Mother     Allergies: Patient has no known allergies. Current Outpatient Medications on File Prior to Visit  Medication Sig Dispense Refill  . ACCU-CHEK SOFTCLIX LANCETS lancets Use up to 4 times daily to check blood sugar. Diagnosis E11.9 100 each 12  . amLODipine (NORVASC) 10 MG tablet TAKE 1 TABLET (10 MG TOTAL) BY MOUTH DAILY. 90  tablet 2  . aspirin EC 81 MG tablet Take 81 mg by mouth daily.      Marland Kitchen azelastine (ASTELIN) 0.1 % nasal spray USE 1 SPRAY IN EACH NOSTRIL TWICE DAILY AS DIRECTED 30 mL 3  . Cholecalciferol (VITAMIN D3) 2000 UNITS TABS Take by mouth.    . Cyanocobalamin (B-12) 5000 MCG SUBL Place under the tongue.    . cyclobenzaprine (FLEXERIL) 10 MG tablet Take 1 tablet (10 mg total) by mouth 3 (three) times daily as needed. 90 tablet 3  . DULoxetine (CYMBALTA) 30 MG capsule Take 1 capsule (30 mg total) by mouth daily. 90 capsule 3  . fluconazole (DIFLUCAN) 200 MG tablet TAKE ONE TABLET ONCE WEEKLY  3  . glucose blood test strip Use as instructed to check blood up to 3 times daily. E11.9 100 each 12  . loratadine (CLARITIN) 10 MG tablet Take 10 mg by mouth daily.    Marland Kitchen losartan (COZAAR) 100 MG tablet TAKE 1 TABLET (100 MG TOTAL) BY MOUTH DAILY. 90 tablet 3  . metFORMIN (GLUCOPHAGE) 500 MG tablet TAKE 1 TABLET (500 MG TOTAL) BY MOUTH 2 (TWO) TIMES DAILY WITH A MEAL. 180 tablet 3  . pravastatin (PRAVACHOL) 40 MG tablet Take 1 tablet (40 mg total) by mouth daily. 90 tablet 2  . ranitidine (ZANTAC) 75 MG tablet  Take 75 mg by mouth daily as needed for heartburn.    . tamsulosin (FLOMAX) 0.4 MG CAPS capsule TAKE 1 CAPSULE BY MOUTH EVERY DAY 90 capsule 1  . terbinafine (LAMISIL) 1 % cream Apply 1 application topically 2 (two) times daily.    . traZODone (DESYREL) 50 MG tablet Take 1 tablet (50 mg total) by mouth at bedtime as needed for sleep. 90 tablet 1  . tretinoin (RETIN-A) 0.025 % cream APPLY A PEA SIZED AMOUNT AT BEDTIME.  3   No current facility-administered medications on file prior to visit.     Social History   Tobacco Use  . Smoking status: Never Smoker  . Smokeless tobacco: Never Used  . Tobacco comment: tobacco use - no  Substance Use Topics  . Alcohol use: Yes    Alcohol/week: 0.0 oz    Comment: rare  . Drug use: No    Review of Systems  Constitutional: Negative for chills and fever.  HENT:  Negative for congestion, ear pain, rhinorrhea, sinus pressure and sore throat.   Respiratory: Negative for cough, shortness of breath and wheezing.   Cardiovascular: Positive for leg swelling (resolved). Negative for chest pain and palpitations.  Gastrointestinal: Negative for diarrhea, nausea and vomiting.  Genitourinary: Negative for dysuria.  Musculoskeletal: Negative for myalgias.  Skin: Negative for rash.  Neurological: Positive for tremors. Negative for weakness, numbness and headaches.  Hematological: Negative for adenopathy.      Objective:    BP (!) 150/86 (BP Location: Left Arm, Patient Position: Sitting, Cuff Size: Normal)   Pulse 66   Temp 98.7 F (37.1 C) (Oral)   Wt 225 lb 6 oz (102.2 kg)   SpO2 96%   BMI 30.57 kg/m  BP Readings from Last 3 Encounters:  01/17/18 (!) 150/86  11/10/17 140/72  10/23/17 132/70   Wt Readings from Last 3 Encounters:  01/17/18 225 lb 6 oz (102.2 kg)  11/10/17 225 lb (102.1 kg)  10/23/17 221 lb (100.2 kg)    Physical Exam  Constitutional: He appears well-developed and well-nourished.  HENT:  Right Ear: Hearing normal.  Left Ear: Hearing normal.  Mouth/Throat: Uvula is midline, oropharynx is clear and moist and mucous membranes are normal. No posterior oropharyngeal edema or posterior oropharyngeal erythema.  Eyes: Conjunctivae, EOM and lids are normal. Pupils are equal, round, and reactive to light. Lids are everted and swept, no foreign bodies found.  Normal fundus bilaterally.  Cardiovascular: Regular rhythm and normal heart sounds.  No le edema  Pulmonary/Chest: Effort normal and breath sounds normal. No respiratory distress. He has no wheezes. He has no rhonchi. He has no rales.  Lymphadenopathy:       Head (right side): No submental, no submandibular, no tonsillar, no preauricular, no posterior auricular and no occipital adenopathy present.       Head (left side): No submental, no submandibular, no tonsillar, no preauricular,  no posterior auricular and no occipital adenopathy present.    He has no cervical adenopathy.  Neurological: He is alert. He has normal strength. No cranial nerve deficit or sensory deficit. He displays a negative Romberg sign.  Reflex Scores:      Bicep reflexes are 2+ on the right side and 2+ on the left side.      Patellar reflexes are 2+ on the right side and 2+ on the left side. Tremor in right hand noted when holding bottle in room.  No cogwheeling. Worsens with intention. Grip equal and strong bilateral upper extremities. Gait  strong and steady. Able to perform rapid alternating movement without difficulty. No gait abnormality, able to turn without problem.   Skin: Skin is warm and dry.  Psychiatric: He has a normal mood and affect. His speech is normal and behavior is normal.  Vitals reviewed.      Assessment & Plan:   Problem List Items Addressed This Visit      Cardiovascular and Mediastinum   Hypertension - Primary    Elevated. In the absence of h/o HF, patient and I agreed to dc lasix and start HCTZ for better blood pressure control. With BBB, would refrain from using beta blocker therapy.  Encouraged low salt.       Relevant Medications   hydrochlorothiazide (MICROZIDE) 12.5 MG capsule   Other Relevant Orders   Comprehensive metabolic panel   Hemoglobin A1c   Microalbumin / creatinine urine ratio   EKG 12-Lead (Completed)   Basic metabolic panel   Right bundle branch block    EKG continues to demonstrate RBBB. No significant changes since prior EKG 2015. No ischemia noted. Remains asymptomatic; patient will let me know of any symptoms , which would then warrant seeing gollan again.       Relevant Medications   hydrochlorothiazide (MICROZIDE) 12.5 MG capsule     Other   Tremor    Noted with intention ( holding glass)? Essential tremor. No other neurologic features, specifically not bradykinesia noted today. Not worsening. No gait abnormalities or other  parkinsonism features at this time. Pending a1c, tsh, wilson disease lab evaluation. Close vigilance and patient declines medication therapy at this time.       Relevant Orders   TSH   Magnesium   B12 and Folate Panel   Ceruloplasmin       I have discontinued Chancy Hurter Jr.'s ketoconazole, furosemide, amoxicillin-clavulanate, HYDROcodone-acetaminophen, and baclofen. I am also having him start on hydrochlorothiazide. Additionally, I am having him maintain his aspirin EC, ranitidine, loratadine, Vitamin D3, B-12, cyclobenzaprine, azelastine, ACCU-CHEK SOFTCLIX LANCETS, glucose blood, DULoxetine, losartan, pravastatin, metFORMIN, tamsulosin, amLODipine, traZODone, fluconazole, tretinoin, and terbinafine.   Meds ordered this encounter  Medications  . hydrochlorothiazide (MICROZIDE) 12.5 MG capsule    Sig: Take 1 capsule (12.5 mg total) by mouth daily.    Dispense:  90 capsule    Refill:  1    Order Specific Question:   Supervising Provider    Answer:   Crecencio Mc [[id number]]    Return precautions given.   Risks, benefits, and alternatives of the medications and treatment plan prescribed today were discussed, and patient expressed understanding.   Education regarding symptom management and diagnosis given to patient on AVS.  Continue to follow with Burnard Hawthorne, FNP for routine health maintenance.   [place] agreed with plan.   Mable Paris, FNP

## 2018-01-17 NOTE — Assessment & Plan Note (Addendum)
EKG continues to demonstrate RBBB. No significant changes since prior EKG 2015. No ischemia noted. Remains asymptomatic; patient will let me know of any symptoms , which would then warrant seeing gollan again.

## 2018-01-17 NOTE — Assessment & Plan Note (Addendum)
Elevated. In the absence of h/o HF, patient and I agreed to dc lasix and start HCTZ for better blood pressure control. With BBB, would refrain from using beta blocker therapy.  Encouraged low salt.

## 2018-01-17 NOTE — Assessment & Plan Note (Addendum)
Noted with intention ( holding glass)? Essential tremor. No other neurologic features, specifically not bradykinesia noted today. Not worsening. No gait abnormalities or other parkinsonism features at this time. Pending a1c, tsh, wilson disease lab evaluation. Close vigilance and patient declines medication therapy at this time.

## 2018-01-18 ENCOUNTER — Ambulatory Visit: Payer: PPO | Admitting: Family

## 2018-01-22 ENCOUNTER — Other Ambulatory Visit (INDEPENDENT_AMBULATORY_CARE_PROVIDER_SITE_OTHER): Payer: PPO

## 2018-01-22 DIAGNOSIS — R251 Tremor, unspecified: Secondary | ICD-10-CM

## 2018-01-22 DIAGNOSIS — I10 Essential (primary) hypertension: Secondary | ICD-10-CM

## 2018-01-22 NOTE — Addendum Note (Signed)
Addended by: Leeanne Rio on: 01/22/2018 10:46 AM   Modules accepted: Orders

## 2018-01-23 LAB — BASIC METABOLIC PANEL
BUN: 18 mg/dL (ref 7–25)
CALCIUM: 10 mg/dL (ref 8.6–10.3)
CO2: 29 mmol/L (ref 20–32)
Chloride: 99 mmol/L (ref 98–110)
Creat: 1.04 mg/dL (ref 0.70–1.18)
GLUCOSE: 152 mg/dL — AB (ref 65–99)
POTASSIUM: 4.2 mmol/L (ref 3.5–5.3)
Sodium: 137 mmol/L (ref 135–146)

## 2018-01-23 LAB — CERULOPLASMIN: CERULOPLASMIN: 27 mg/dL (ref 18–36)

## 2018-01-24 ENCOUNTER — Encounter: Payer: Self-pay | Admitting: Family

## 2018-01-25 ENCOUNTER — Other Ambulatory Visit: Payer: Self-pay | Admitting: Family

## 2018-01-25 DIAGNOSIS — E119 Type 2 diabetes mellitus without complications: Secondary | ICD-10-CM

## 2018-01-25 MED ORDER — METFORMIN HCL ER 500 MG PO TB24: ORAL_TABLET | ORAL | 3 refills | Status: DC

## 2018-02-09 ENCOUNTER — Other Ambulatory Visit: Payer: Self-pay | Admitting: Family Medicine

## 2018-02-14 ENCOUNTER — Encounter: Payer: Self-pay | Admitting: Family

## 2018-02-14 ENCOUNTER — Ambulatory Visit (INDEPENDENT_AMBULATORY_CARE_PROVIDER_SITE_OTHER): Payer: PPO | Admitting: Family

## 2018-02-14 VITALS — BP 158/94 | HR 87 | Temp 98.1°F | Wt 225.6 lb

## 2018-02-14 DIAGNOSIS — J329 Chronic sinusitis, unspecified: Secondary | ICD-10-CM

## 2018-02-14 DIAGNOSIS — I10 Essential (primary) hypertension: Secondary | ICD-10-CM

## 2018-02-14 MED ORDER — METOPROLOL SUCCINATE ER 25 MG PO TB24: 25.0000 mg | ORAL_TABLET | Freq: Every day | ORAL | 1 refills | Status: DC

## 2018-02-14 MED ORDER — AMOXICILLIN 500 MG PO CAPS: 500.0000 mg | ORAL_CAPSULE | Freq: Two times a day (BID) | ORAL | 0 refills | Status: DC

## 2018-02-14 NOTE — Patient Instructions (Signed)
Start toprol for blood pressure  LOW salt  Let me know if you feel lightheaded or dizzy on medication  Monitor blood pressure,  Goal is less than 130/80; if persistently higher, please make sooner follow up appointment so we can recheck you blood pressure and manage medications  No decongestants with high blood pressure - avoid mucinex D or mucinex DM. Use PLAIN mucinex and plenty of water  Start amoxicillin and ensure a probiotic  Let me know if no improvement   DASH Eating Plan DASH stands for "Dietary Approaches to Stop Hypertension." The DASH eating plan is a healthy eating plan that has been shown to reduce high blood pressure (hypertension). It may also reduce your risk for type 2 diabetes, heart disease, and stroke. The DASH eating plan may also help with weight loss. What are tips for following this plan? General guidelines  Avoid eating more than 2,300 mg (milligrams) of salt (sodium) a day. If you have hypertension, you may need to reduce your sodium intake to 1,500 mg a day.  Limit alcohol intake to no more than 1 drink a day for nonpregnant women and 2 drinks a day for men. One drink equals 12 oz of beer, 5 oz of wine, or 1 oz of hard liquor.  Work with your health care provider to maintain a healthy body weight or to lose weight. Ask what an ideal weight is for you.  Get at least 30 minutes of exercise that causes your heart to beat faster (aerobic exercise) most days of the week. Activities may include walking, swimming, or biking.  Work with your health care provider or diet and nutrition specialist (dietitian) to adjust your eating plan to your individual calorie needs. Reading food labels  Check food labels for the amount of sodium per serving. Choose foods with less than 5 percent of the Daily Value of sodium. Generally, foods with less than 300 mg of sodium per serving fit into this eating plan.  To find whole grains, look for the word "whole" as the first word in  the ingredient list. Shopping  Buy products labeled as "low-sodium" or "no salt added."  Buy fresh foods. Avoid canned foods and premade or frozen meals. Cooking  Avoid adding salt when cooking. Use salt-free seasonings or herbs instead of table salt or sea salt. Check with your health care provider or pharmacist before using salt substitutes.  Do not fry foods. Cook foods using healthy methods such as baking, boiling, grilling, and broiling instead.  Cook with heart-healthy oils, such as olive, canola, soybean, or sunflower oil. Meal planning   Eat a balanced diet that includes: ? 5 or more servings of fruits and vegetables each day. At each meal, try to fill half of your plate with fruits and vegetables. ? Up to 6-8 servings of whole grains each day. ? Less than 6 oz of lean meat, poultry, or fish each day. A 3-oz serving of meat is about the same size as a deck of cards. One egg equals 1 oz. ? 2 servings of low-fat dairy each day. ? A serving of nuts, seeds, or beans 5 times each week. ? Heart-healthy fats. Healthy fats called Omega-3 fatty acids are found in foods such as flaxseeds and coldwater fish, like sardines, salmon, and mackerel.  Limit how much you eat of the following: ? Canned or prepackaged foods. ? Food that is high in trans fat, such as fried foods. ? Food that is high in saturated fat, such as  fatty meat. ? Sweets, desserts, sugary drinks, and other foods with added sugar. ? Full-fat dairy products.  Do not salt foods before eating.  Try to eat at least 2 vegetarian meals each week.  Eat more home-cooked food and less restaurant, buffet, and fast food.  When eating at a restaurant, ask that your food be prepared with less salt or no salt, if possible. What foods are recommended? The items listed may not be a complete list. Talk with your dietitian about what dietary choices are best for you. Grains Whole-grain or whole-wheat bread. Whole-grain or  whole-wheat pasta. Brown rice. [place]. Bulgur. Whole-grain and low-sodium cereals. Pita bread. Low-fat, low-sodium crackers. Whole-wheat flour tortillas. Vegetables Fresh or frozen vegetables (raw, steamed, roasted, or grilled). Low-sodium or reduced-sodium tomato and vegetable juice. Low-sodium or reduced-sodium tomato sauce and tomato paste. Low-sodium or reduced-sodium canned vegetables. Fruits All fresh, dried, or frozen fruit. Canned fruit in natural juice (without added sugar). Meat and other protein foods Skinless chicken or [place]. Ground chicken or [place]. Pork with fat trimmed off. Fish and seafood. Egg whites. Dried beans, peas, or lentils. Unsalted nuts, nut butters, and seeds. Unsalted canned beans. Lean cuts of beef with fat trimmed off. Low-sodium, lean deli meat. Dairy Low-fat (1%) or fat-free (skim) milk. Fat-free, low-fat, or reduced-fat cheeses. Nonfat, low-sodium ricotta or cottage cheese. Low-fat or nonfat yogurt. Low-fat, low-sodium cheese. Fats and oils Soft margarine without trans fats. Vegetable oil. Low-fat, reduced-fat, or light mayonnaise and salad dressings (reduced-sodium). Canola, safflower, olive, soybean, and sunflower oils. Avocado. Seasoning and other foods Herbs. Spices. Seasoning mixes without salt. Unsalted popcorn and pretzels. Fat-free sweets. What foods are not recommended? The items listed may not be a complete list. Talk with your dietitian about what dietary choices are best for you. Grains Baked goods made with fat, such as croissants, muffins, or some breads. Dry pasta or rice meal packs. Vegetables Creamed or fried vegetables. Vegetables in a cheese sauce. Regular canned vegetables (not low-sodium or reduced-sodium). Regular canned tomato sauce and paste (not low-sodium or reduced-sodium). Regular tomato and vegetable juice (not low-sodium or reduced-sodium). Angie Fava. Olives. Fruits Canned fruit in a light or heavy syrup. Fried fruit. Fruit  in cream or butter sauce. Meat and other protein foods Fatty cuts of meat. Ribs. Fried meat. Berniece Salines. Sausage. Bologna and other processed lunch meats. Salami. Fatback. Hotdogs. Bratwurst. Salted nuts and seeds. Canned beans with added salt. Canned or smoked fish. Whole eggs or egg yolks. Chicken or [place] with skin. Dairy Whole or 2% milk, cream, and half-and-half. Whole or full-fat cream cheese. Whole-fat or sweetened yogurt. Full-fat cheese. Nondairy creamers. Whipped toppings. Processed cheese and cheese spreads. Fats and oils Butter. Stick margarine. Lard. Shortening. Ghee. Bacon fat. Tropical oils, such as coconut, palm kernel, or palm oil. Seasoning and other foods Salted popcorn and pretzels. Onion salt, garlic salt, seasoned salt, table salt, and sea salt. Worcestershire sauce. Tartar sauce. Barbecue sauce. Teriyaki sauce. Soy sauce, including reduced-sodium. Steak sauce. Canned and packaged gravies. Fish sauce. Oyster sauce. Cocktail sauce. Horseradish that you find on the shelf. Ketchup. Mustard. Meat flavorings and tenderizers. Bouillon cubes. Hot sauce and Tabasco sauce. Premade or packaged marinades. Premade or packaged taco seasonings. Relishes. Regular salad dressings. Where to find more information:  National Heart, Lung, and [place]: https://wilson-eaton.com/  American Heart Association: www.heart.org Summary  The DASH eating plan is a healthy eating plan that has been shown to reduce high blood pressure (hypertension). It may also reduce your risk for  type 2 diabetes, heart disease, and stroke.  With the DASH eating plan, you should limit salt (sodium) intake to 2,300 mg a day. If you have hypertension, you may need to reduce your sodium intake to 1,500 mg a day.  When on the DASH eating plan, aim to eat more fresh fruits and vegetables, whole grains, lean proteins, low-fat dairy, and heart-healthy fats.  Work with your health care provider or diet and nutrition specialist  (dietitian) to adjust your eating plan to your individual calorie needs. This information is not intended to replace advice given to you by your health care provider. Make sure you discuss any questions you have with your health care provider. Document Released: 11/10/2011 Document Revised: 11/14/2016 Document Reviewed: 11/14/2016 Elsevier Interactive Patient Education  [employer].

## 2018-02-14 NOTE — Progress Notes (Signed)
Subjective:    Patient ID: Larry Ramus., male    DOB: 1946/09/24, 72 y.o.   MRN: [id number]  CC: Larry Roman. is a 72 y.o. male who presents today for an acute visit.    HPI: CC: sinus congestion, 8 days, some improvement.   Endorses productive cough, sore throat and mucous draining on left side. No wheezing, sob, fever, ear pain.  Sore between 'shoulder blades' , started 3 days ago, improved. Thinks from coughing. No injury. Tried mucinex D, robitussin with some relief.   HTN- at last visit started hctz, dced lasiz. At home 160/90.   Denies exertional chest pain or pressure, dyspepsia. numbness or tingling radiating to left arm or jaw, palpitations, dizziness, frequent headaches, changes in vision, or shortness of breath.    tremor-notes slightly better    HISTORY:  Past Medical History:  Diagnosis Date  . Depression   . DM (diabetes mellitus) ([place])   . Extrinsic asthma, unspecified    childhood  . HTN (hypertension)   . Other allergy, other than to medicinal agents    Past Surgical History:  Procedure Laterality Date  . BACK SURGERY    . CATARACT EXTRACTION, BILATERAL  2016   Dr. Kerman Passey at [facility]  . TONSILLECTOMY     Family History  Problem Relation Age of Onset  . Heart failure Father   . Heart attack Mother     Allergies: Patient has no known allergies. Current Outpatient Medications on File Prior to Visit  Medication Sig Dispense Refill  . ACCU-CHEK SOFTCLIX LANCETS lancets Use up to 4 times daily to check blood sugar. Diagnosis E11.9 100 each 12  . amLODipine (NORVASC) 10 MG tablet TAKE 1 TABLET (10 MG TOTAL) BY MOUTH DAILY. 90 tablet 2  . aspirin EC 81 MG tablet Take 81 mg by mouth daily.      Marland Kitchen azelastine (ASTELIN) 0.1 % nasal spray USE 1 SPRAY IN EACH NOSTRIL TWICE DAILY AS DIRECTED 30 mL 3  . Cholecalciferol (VITAMIN D3) 2000 UNITS TABS Take by mouth.    . Cyanocobalamin (B-12) 5000 MCG SUBL Place under the tongue.    .  DULoxetine (CYMBALTA) 30 MG capsule Take 1 capsule (30 mg total) by mouth daily. 90 capsule 3  . fluconazole (DIFLUCAN) 200 MG tablet TAKE ONE TABLET ONCE WEEKLY  3  . hydrochlorothiazide (MICROZIDE) 12.5 MG capsule Take 1 capsule (12.5 mg total) by mouth daily. 90 capsule 1  . loratadine (CLARITIN) 10 MG tablet Take 10 mg by mouth daily.    Marland Kitchen losartan (COZAAR) 100 MG tablet TAKE 1 TABLET (100 MG TOTAL) BY MOUTH DAILY. 90 tablet 3  . metFORMIN (GLUCOPHAGE XR) 500 MG 24 hr tablet Start 500mg  PO qpm, then increase to 1000 mg ER PO qpm. 90 tablet 3  . ONETOUCH VERIO test strip TEST 3 TIMES A DAY 100 each 2  . pravastatin (PRAVACHOL) 40 MG tablet Take 1 tablet (40 mg total) by mouth daily. 90 tablet 2  . ranitidine (ZANTAC) 75 MG tablet Take 75 mg by mouth daily as needed for heartburn.    . tamsulosin (FLOMAX) 0.4 MG CAPS capsule TAKE 1 CAPSULE BY MOUTH EVERY DAY 90 capsule 1  . traZODone (DESYREL) 50 MG tablet Take 1 tablet (50 mg total) by mouth at bedtime as needed for sleep. 90 tablet 1  . tretinoin (RETIN-A) 0.025 % cream APPLY A PEA SIZED AMOUNT AT BEDTIME.  3   No current facility-administered medications on  file prior to visit.     Social History   Tobacco Use  . Smoking status: Never Smoker  . Smokeless tobacco: Never Used  . Tobacco comment: tobacco use - no  Substance Use Topics  . Alcohol use: Yes    Alcohol/week: 0.0 oz    Comment: rare  . Drug use: No    Review of Systems  Constitutional: Negative for chills and fever.  HENT: Positive for congestion. Negative for ear discharge, ear pain and sinus pressure.   Respiratory: Positive for cough. Negative for shortness of breath and wheezing.   Cardiovascular: Negative for chest pain and palpitations.  Gastrointestinal: Negative for nausea and vomiting.  Musculoskeletal: Positive for back pain (improving).      Objective:    BP (!) 158/94 (BP Location: Left Arm, Patient Position: Sitting, Cuff Size: Normal)   Pulse 87    Temp 98.1 F (36.7 C) (Oral)   Wt 225 lb 9.6 oz (102.3 kg)   SpO2 96%   BMI 30.60 kg/m  BP Readings from Last 3 Encounters:  02/14/18 (!) 158/94  01/17/18 (!) 150/86  11/10/17 140/72     Physical Exam  Constitutional: Vital signs are normal. He appears well-developed and well-nourished.  HENT:  Head: Normocephalic and atraumatic.  Right Ear: Hearing, tympanic membrane, external ear and ear canal normal. No drainage, swelling or tenderness. Tympanic membrane is not injected, not erythematous and not bulging. No middle ear effusion. No decreased hearing is noted.  Left Ear: Hearing, tympanic membrane, external ear and ear canal normal. No drainage, swelling or tenderness. Tympanic membrane is not injected, not erythematous and not bulging.  No middle ear effusion. No decreased hearing is noted.  Nose: Nose normal. Right sinus exhibits no maxillary sinus tenderness and no frontal sinus tenderness. Left sinus exhibits no maxillary sinus tenderness and no frontal sinus tenderness.  Mouth/Throat: Uvula is midline, oropharynx is clear and moist and mucous membranes are normal. No oropharyngeal exudate, posterior oropharyngeal edema, posterior oropharyngeal erythema or tonsillar abscesses.  Eyes: Conjunctivae are normal.  Cardiovascular: Regular rhythm and normal heart sounds.  Pulmonary/Chest: Effort normal and breath sounds normal. No respiratory distress. He has no wheezes. He has no rhonchi. He has no rales.  Musculoskeletal:       Right shoulder: He exhibits normal range of motion, no tenderness, no bony tenderness, no pain and no spasm.       Left shoulder: Normal. He exhibits normal range of motion, no tenderness, no bony tenderness, no pain and no spasm.       Arms: Reproducible pain on exam as marked on diagram. Mild.   Lymphadenopathy:       Head (right side): No submental, no submandibular, no tonsillar, no preauricular, no posterior auricular and no occipital adenopathy present.        Head (left side): No submental, no submandibular, no tonsillar, no preauricular, no posterior auricular and no occipital adenopathy present.    He has no cervical adenopathy.  Neurological: He is alert.  Skin: Skin is warm and dry.  Psychiatric: He has a normal mood and affect. His speech is normal and behavior is normal.  Vitals reviewed.      Assessment & Plan:   Problem List Items Addressed This Visit      Cardiovascular and Mediastinum   Hypertension - Primary    Elevated today. Advised to stop decongestants as suspect contributory.  We will go ahead and start a beta-blocker cautiously in the context of the right bundle  branch block.  Case reviewed with supervising, Dr Deborra Medina, and she and I jointly agreed on management plan.   Reassured by exam today and suspect "soreness" of mid back is related to coughing as it is largely dissipated at this time.  Patient will let me know if recurs.  Both of [place] agreed likely triggered from coughing patient is been doing.      Relevant Medications   metoprolol succinate (TOPROL-XL) 25 MG 24 hr tablet     Respiratory   Sinusitis    Based on duration of symptoms, patient and I jointly agreed to go ahead and start antibiotic therapy today.  He will let me know if he is not better.      Relevant Medications   amoxicillin (AMOXIL) 500 MG capsule         I have discontinued Chancy Hurter Jr.'s cyclobenzaprine and terbinafine. I am also having him start on metoprolol succinate and amoxicillin. Additionally, I am having him maintain his aspirin EC, ranitidine, loratadine, Vitamin D3, B-12, azelastine, ACCU-CHEK SOFTCLIX LANCETS, DULoxetine, losartan, pravastatin, tamsulosin, amLODipine, traZODone, fluconazole, tretinoin, hydrochlorothiazide, metFORMIN, and ONETOUCH VERIO.   Meds ordered this encounter  Medications  . metoprolol succinate (TOPROL-XL) 25 MG 24 hr tablet    Sig: Take 1 tablet (25 mg total) by mouth daily.    Dispense:   90 tablet    Refill:  1    Order Specific Question:   Supervising Provider    Answer:   Deborra Medina L [[id number]]  . amoxicillin (AMOXIL) 500 MG capsule    Sig: Take 1 capsule (500 mg total) by mouth 2 (two) times daily.    Dispense:  14 capsule    Refill:  0    Order Specific Question:   Supervising Provider    Answer:   Crecencio Mc [[id number]]    Return precautions given.   Risks, benefits, and alternatives of the medications and treatment plan prescribed today were discussed, and patient expressed understanding.   Education regarding symptom management and diagnosis given to patient on AVS.  Continue to follow with Burnard Hawthorne, FNP for routine health maintenance.   [place] agreed with plan.   Mable Paris, FNP

## 2018-02-16 NOTE — Assessment & Plan Note (Addendum)
Elevated today. Advised to stop decongestants as suspect contributory.  We will go ahead and start a beta-blocker cautiously in the context of the right bundle branch block.  Case reviewed with supervising, Dr Deborra Medina, and she and I jointly agreed on management plan.   Reassured by exam today and suspect "soreness" of mid back is related to coughing as it is largely dissipated at this time.  Patient will let me know if recurs.  Both of [place] agreed likely triggered from coughing patient is been doing.

## 2018-02-16 NOTE — Assessment & Plan Note (Signed)
Based on duration of symptoms, patient and I jointly agreed to go ahead and start antibiotic therapy today.  He will let me know if he is not better.

## 2018-02-24 ENCOUNTER — Other Ambulatory Visit: Payer: Self-pay | Admitting: Family

## 2018-02-24 DIAGNOSIS — E785 Hyperlipidemia, unspecified: Secondary | ICD-10-CM

## 2018-03-24 ENCOUNTER — Other Ambulatory Visit: Payer: Self-pay | Admitting: Family

## 2018-03-25 ENCOUNTER — Other Ambulatory Visit: Payer: Self-pay | Admitting: Family Medicine

## 2018-04-10 DIAGNOSIS — M170 Bilateral primary osteoarthritis of knee: Secondary | ICD-10-CM | POA: Diagnosis not present

## 2018-04-11 ENCOUNTER — Telehealth: Payer: Self-pay | Admitting: Family

## 2018-04-11 ENCOUNTER — Other Ambulatory Visit: Payer: Self-pay | Admitting: Family Medicine

## 2018-04-11 NOTE — Telephone Encounter (Signed)
Call pt We got an alert from pharmacy regarding duplicate meds  Please ensure he is not taking duplicate or double doses metformin or metoprolol

## 2018-04-16 ENCOUNTER — Encounter: Payer: Self-pay | Admitting: Family

## 2018-04-16 ENCOUNTER — Ambulatory Visit (INDEPENDENT_AMBULATORY_CARE_PROVIDER_SITE_OTHER): Payer: PPO | Admitting: Family

## 2018-04-16 VITALS — BP 144/86 | HR 69 | Temp 98.2°F | Resp 16 | Wt 218.2 lb

## 2018-04-16 DIAGNOSIS — M25562 Pain in left knee: Secondary | ICD-10-CM | POA: Diagnosis not present

## 2018-04-16 DIAGNOSIS — M25561 Pain in right knee: Secondary | ICD-10-CM | POA: Diagnosis not present

## 2018-04-16 DIAGNOSIS — G4733 Obstructive sleep apnea (adult) (pediatric): Secondary | ICD-10-CM | POA: Diagnosis not present

## 2018-04-16 DIAGNOSIS — E1121 Type 2 diabetes mellitus with diabetic nephropathy: Secondary | ICD-10-CM

## 2018-04-16 DIAGNOSIS — G8929 Other chronic pain: Secondary | ICD-10-CM | POA: Diagnosis not present

## 2018-04-16 DIAGNOSIS — R251 Tremor, unspecified: Secondary | ICD-10-CM

## 2018-04-16 DIAGNOSIS — I10 Essential (primary) hypertension: Secondary | ICD-10-CM

## 2018-04-16 LAB — LIPID PANEL
Cholesterol: 103 mg/dL (ref 0–200)
HDL: 52.6 mg/dL (ref >39.00)
LDL Cholesterol: 30 mg/dL (ref 0–99)
NONHDL: 50.89
TRIGLYCERIDES: 106 mg/dL (ref 0.0–149.0)
Total CHOL/HDL Ratio: 2
VLDL: 21.2 mg/dL (ref 0.0–40.0)

## 2018-04-16 LAB — HEMOGLOBIN A1C: Hgb A1c MFr Bld: 6.9 % — ABNORMAL HIGH (ref 4.6–6.5)

## 2018-04-16 MED ORDER — DICLOFENAC SODIUM 1 % TD GEL: 4.0000 g | Freq: Four times a day (QID) | TRANSDERMAL | 3 refills | Status: DC

## 2018-04-16 NOTE — Telephone Encounter (Signed)
Called patient and confirmed he wasn't taking duplicated medication and he stated he wasn't

## 2018-04-16 NOTE — Assessment & Plan Note (Addendum)
Prior HR 87, today 69. Systolic blood pressure still not in range.  Discussed with patient's daily use of Aleve as well as untreated sleep apnea.  He will stop Aleve and trial Tylenol arthritis, Voltaren gel for his knees.  Pending sleep study.  Patient will monitor blood pressure at home and call me with readings.  We will discuss from there if evaluation of resistant hypertension including renal ultrasound, catecholamine labs is appropriate. May also consider increasing HCTZ. HR limits increase of beta blocker.  Will follow.

## 2018-04-16 NOTE — Assessment & Plan Note (Signed)
Resolved, noticeable at this time.?  Did Metoprolol help.  Patient will let me know if recurs.

## 2018-04-16 NOTE — Progress Notes (Signed)
Subjective:    Patient ID: Gardiner Ramus., male    DOB: Feb 17, 1946, 72 y.o.   MRN: [id number]  CC: Yaqub Arney. is a 72 y.o. male who presents today for follow up.   HPI: Feeling well today.   HTN - at home 135-140/85. Notes HR to 65-79. No longer has pain in upper back. No more on decongestants. Following low salt.  Notes losartan was not recalled per pharmacy.  Denies exertional chest pain or pressure, numbness or tingling radiating to left arm or jaw, palpitations, dizziness, frequent headaches, changes in vision, or shortness of breath.   OSA- not wearing cipap. States 'mild.' Tested many years ago.   DM- FBG 180s on metformin 1000mg  XL - had been 130 . Notes dietary discretion, eating  'too many carbs'.  Tremor is gone. 'hadnt even thought about it'.   Takes aleve QD for knee pain. Follows with orthopedics. Cortizone shots in knees last week.    HISTORY:  Past Medical History:  Diagnosis Date  . Depression   . DM (diabetes mellitus) ([place])   . Extrinsic asthma, unspecified    childhood  . HTN (hypertension)   . Other allergy, other than to medicinal agents    Past Surgical History:  Procedure Laterality Date  . BACK SURGERY    . CATARACT EXTRACTION, BILATERAL  2016   Dr. Kerman Passey at [facility]  . TONSILLECTOMY     Family History  Problem Relation Age of Onset  . Heart failure Father   . Heart attack Mother     Allergies: Patient has no known allergies. Current Outpatient Medications on File Prior to Visit  Medication Sig Dispense Refill  . ACCU-CHEK SOFTCLIX LANCETS lancets Use up to 4 times daily to check blood sugar. Diagnosis E11.9 100 each 12  . amLODipine (NORVASC) 10 MG tablet TAKE 1 TABLET (10 MG TOTAL) BY MOUTH DAILY. 90 tablet 2  . amoxicillin (AMOXIL) 500 MG capsule Take 1 capsule (500 mg total) by mouth 2 (two) times daily. 14 capsule 0  . aspirin EC 81 MG tablet Take 81 mg by mouth daily.      Marland Kitchen azelastine (ASTELIN) 0.1 % nasal  spray USE 1 SPRAY IN EACH NOSTRIL TWICE DAILY AS DIRECTED 30 mL 1  . Cholecalciferol (VITAMIN D3) 2000 UNITS TABS Take by mouth.    . Cyanocobalamin (B-12) 5000 MCG SUBL Place under the tongue.    . DULoxetine (CYMBALTA) 30 MG capsule TAKE 1 CAPSULE BY MOUTH EVERY DAY 90 capsule 1  . fluconazole (DIFLUCAN) 200 MG tablet TAKE ONE TABLET ONCE WEEKLY  3  . hydrochlorothiazide (MICROZIDE) 12.5 MG capsule Take 1 capsule (12.5 mg total) by mouth daily. 90 capsule 1  . loratadine (CLARITIN) 10 MG tablet Take 10 mg by mouth daily.    Marland Kitchen losartan (COZAAR) 100 MG tablet TAKE 1 TABLET (100 MG TOTAL) BY MOUTH DAILY. 90 tablet 3  . metFORMIN (GLUCOPHAGE XR) 500 MG 24 hr tablet Start 500mg  PO qpm, then increase to 1000 mg ER PO qpm. 90 tablet 3  . metoprolol succinate (TOPROL-XL) 25 MG 24 hr tablet Take 1 tablet (25 mg total) by mouth daily. 90 tablet 1  . ONETOUCH VERIO test strip TEST 3 TIMES A DAY 100 each 2  . pravastatin (PRAVACHOL) 40 MG tablet TAKE 1 TABLET BY MOUTH EVERY DAY 90 tablet 2  . ranitidine (ZANTAC) 75 MG tablet Take 75 mg by mouth daily as needed for heartburn.    Marland Kitchen  tamsulosin (FLOMAX) 0.4 MG CAPS capsule TAKE 1 CAPSULE BY MOUTH EVERY DAY 90 capsule 0  . traZODone (DESYREL) 50 MG tablet Take 1 tablet (50 mg total) by mouth at bedtime as needed for sleep. 90 tablet 1   No current facility-administered medications on file prior to visit.     Social History   Tobacco Use  . Smoking status: Never Smoker  . Smokeless tobacco: Never Used  . Tobacco comment: tobacco use - no  Substance Use Topics  . Alcohol use: Yes    Alcohol/week: 0.0 oz    Comment: rare  . Drug use: No    Review of Systems  Constitutional: Negative for chills and fever.  Respiratory: Negative for cough.   Cardiovascular: Positive for leg swelling (trace ankle; at end of day). Negative for chest pain and palpitations.  Gastrointestinal: Negative for nausea and vomiting.  Musculoskeletal: Positive for back pain  (chronic).      Objective:    BP (!) 144/86 (BP Location: Left Arm, Patient Position: Sitting, Cuff Size: Normal)   Pulse 69   Temp 98.2 F (36.8 C) (Oral)   Resp 16   Wt 218 lb 4 oz (99 kg)   SpO2 98%   BMI 29.60 kg/m  BP Readings from Last 3 Encounters:  04/16/18 (!) 144/86  02/14/18 (!) 158/94  01/17/18 (!) 150/86   Wt Readings from Last 3 Encounters:  04/16/18 218 lb 4 oz (99 kg)  02/14/18 225 lb 9.6 oz (102.3 kg)  01/17/18 225 lb 6 oz (102.2 kg)    Physical Exam  Constitutional: He appears well-developed and well-nourished.  Cardiovascular: Regular rhythm and normal heart sounds.  No LE edema, palpable cords or masses. No erythema or increased warmth. No asymmetry in calf size when compared bilaterally LE hair growth symmetric and present. No discoloration of varicosities noted. LE warm and palpable pedal pulses.    Pulmonary/Chest: Effort normal and breath sounds normal. No respiratory distress. He has no wheezes. He has no rhonchi. He has no rales.  Lymphadenopathy:       Head (left side): No submandibular and no preauricular adenopathy present.  Neurological: He is alert.  Skin: Skin is warm and dry.  Psychiatric: He has a normal mood and affect. His speech is normal and behavior is normal.  Vitals reviewed.      Assessment & Plan:   Problem List Items Addressed This Visit      Cardiovascular and Mediastinum   Hypertension - Primary    Prior HR 87, today 69. Systolic blood pressure still not in range.  Discussed with patient's daily use of Aleve as well as untreated sleep apnea.  He will stop Aleve and trial Tylenol arthritis, Voltaren gel for his knees.  Pending sleep study.  Patient will monitor blood pressure at home and call me with readings.  We will discuss from there if evaluation of resistant hypertension including renal ultrasound, catecholamine labs is appropriate. May also consider increasing HCTZ. HR limits increase of beta blocker.  Will  follow.      Relevant Orders   Ambulatory referral to Sleep Studies     Respiratory   OSA (obstructive sleep apnea)    History of.  Pending sleep study.        Endocrine   Diabetes mellitus type 2, controlled ([place])    Appears uncontrolled.  Recent cortizone injection.  Pending A1c      Relevant Orders   Lipid panel   Hemoglobin A1c  Other   Chronic pain of both knees   Relevant Medications   diclofenac sodium (VOLTAREN) 1 % GEL   Tremor    Resolved, noticeable at this time.?  Did Metoprolol help.  Patient will let me know if recurs.          I have discontinued Chancy Hurter Jr.'s tretinoin. I am also having him start on diclofenac sodium. Additionally, I am having him maintain his aspirin EC, ranitidine, loratadine, Vitamin D3, B-12, ACCU-CHEK SOFTCLIX LANCETS, losartan, amLODipine, traZODone, fluconazole, hydrochlorothiazide, metFORMIN, ONETOUCH VERIO, metoprolol succinate, amoxicillin, pravastatin, tamsulosin, azelastine, and DULoxetine.   Meds ordered this encounter  Medications  . diclofenac sodium (VOLTAREN) 1 % GEL    Sig: Apply 4 g topically 4 (four) times daily.    Dispense:  1 Tube    Refill:  3    Order Specific Question:   Supervising Provider    Answer:   Crecencio Mc [[id number]]    Return precautions given.   Risks, benefits, and alternatives of the medications and treatment plan prescribed today were discussed, and patient expressed understanding.   Education regarding symptom management and diagnosis given to patient on AVS.  Continue to follow with Burnard Hawthorne, FNP for routine health maintenance.   [place] agreed with plan.   Mable Paris, FNP

## 2018-04-16 NOTE — Patient Instructions (Addendum)
NO ALEVE.  Monitor blood pressure,  Goal is less than 130/80; if persistently higher, please make sooner follow up appointment so we can recheck you blood pressure and manage medications  Try tyleonol arthritis, voltaren gel on knees.    Today we discussed referrals, orders. SLEEP STUDY    I have placed these orders in the system for you.  Please be sure to give [place] a call if you have not heard from our office regarding scheduling a test or regarding referral in a timely manner.  It is very important that you let me know as soon as possible.      Managing Your Hypertension Hypertension is commonly called high blood pressure. This is when the force of your blood pressing against the walls of your arteries is too strong. Arteries are blood vessels that carry blood from your heart throughout your body. Hypertension forces the heart to work harder to pump blood, and may cause the arteries to become narrow or stiff. Having untreated or uncontrolled hypertension can cause heart attack, stroke, kidney disease, and other problems. What are blood pressure readings? A blood pressure reading consists of a higher number over a lower number. Ideally, your blood pressure should be below 120/80. The first ("top") number is called the systolic pressure. It is a measure of the pressure in your arteries as your heart beats. The second ("bottom") number is called the diastolic pressure. It is a measure of the pressure in your arteries as the heart relaxes. What does my blood pressure reading mean? Blood pressure is classified into four stages. Based on your blood pressure reading, your health care provider may use the following stages to determine what type of treatment you need, if any. Systolic pressure and diastolic pressure are measured in a unit called mm Hg. Normal  Systolic pressure: below 952.  Diastolic pressure: below 80. Elevated  Systolic pressure: 841-324.  Diastolic pressure: below  80. Hypertension stage 1  Systolic pressure: 401-027.  Diastolic pressure: 25-36. Hypertension stage 2  Systolic pressure: 644 or above.  Diastolic pressure: 90 or above. What health risks are associated with hypertension? Managing your hypertension is an important responsibility. Uncontrolled hypertension can lead to:  A heart attack.  A stroke.  A weakened blood vessel (aneurysm).  Heart failure.  Kidney damage.  Eye damage.  Metabolic syndrome.  Memory and concentration problems.  What changes can I make to manage my hypertension? Hypertension can be managed by making lifestyle changes and possibly by taking medicines. Your health care provider will help you make a plan to bring your blood pressure within a normal range. Eating and drinking  Eat a diet that is high in fiber and potassium, and low in salt (sodium), added sugar, and fat. An example eating plan is called the DASH (Dietary Approaches to Stop Hypertension) diet. To eat this way: ? Eat plenty of fresh fruits and vegetables. Try to fill half of your plate at each meal with fruits and vegetables. ? Eat whole grains, such as whole wheat pasta, brown rice, or whole grain bread. Fill about one quarter of your plate with whole grains. ? Eat low-fat diary products. ? Avoid fatty cuts of meat, processed or cured meats, and poultry with skin. Fill about one quarter of your plate with lean proteins such as fish, chicken without skin, beans, eggs, and tofu. ? Avoid premade and processed foods. These tend to be higher in sodium, added sugar, and fat.  Reduce your daily sodium intake. Most people  with hypertension should eat less than 1,500 mg of sodium a day.  Limit alcohol intake to no more than 1 drink a day for nonpregnant women and 2 drinks a day for men. One drink equals 12 oz of beer, 5 oz of wine, or 1 oz of hard liquor. Lifestyle  Work with your health care provider to maintain a healthy body weight, or to  lose weight. Ask what an ideal weight is for you.  Get at least 30 minutes of exercise that causes your heart to beat faster (aerobic exercise) most days of the week. Activities may include walking, swimming, or biking.  Include exercise to strengthen your muscles (resistance exercise), such as weight lifting, as part of your weekly exercise routine. Try to do these types of exercises for 30 minutes at least 3 days a week.  Do not use any products that contain nicotine or tobacco, such as cigarettes and e-cigarettes. If you need help quitting, ask your health care provider.  Control any long-term (chronic) conditions you have, such as high cholesterol or diabetes. Monitoring  Monitor your blood pressure at home as told by your health care provider. Your personal target blood pressure may vary depending on your medical conditions, your age, and other factors.  Have your blood pressure checked regularly, as often as told by your health care provider. Working with your health care provider  Review all the medicines you take with your health care provider because there may be side effects or interactions.  Talk with your health care provider about your diet, exercise habits, and other lifestyle factors that may be contributing to hypertension.  Visit your health care provider regularly. Your health care provider can help you create and adjust your plan for managing hypertension. Will I need medicine to control my blood pressure? Your health care provider may prescribe medicine if lifestyle changes are not enough to get your blood pressure under control, and if:  Your systolic blood pressure is 130 or higher.  Your diastolic blood pressure is 80 or higher.  Take medicines only as told by your health care provider. Follow the directions carefully. Blood pressure medicines must be taken as prescribed. The medicine does not work as well when you skip doses. Skipping doses also puts you at risk  for problems. Contact a health care provider if:  You think you are having a reaction to medicines you have taken.  You have repeated (recurrent) headaches.  You feel dizzy.  You have swelling in your ankles.  You have trouble with your vision. Get help right away if:  You develop a severe headache or confusion.  You have unusual weakness or numbness, or you feel faint.  You have severe pain in your chest or abdomen.  You vomit repeatedly.  You have trouble breathing. Summary  Hypertension is when the force of blood pumping through your arteries is too strong. If this condition is not controlled, it may put you at risk for serious complications.  Your personal target blood pressure may vary depending on your medical conditions, your age, and other factors. For most people, a normal blood pressure is less than 120/80.  Hypertension is managed by lifestyle changes, medicines, or both. Lifestyle changes include weight loss, eating a healthy, low-sodium diet, exercising more, and limiting alcohol. This information is not intended to replace advice given to you by your health care provider. Make sure you discuss any questions you have with your health care provider. Document Released: 08/15/2012 Document Revised: 10/19/2016  Document Reviewed: 10/19/2016 Elsevier Interactive Patient Education  [employer].

## 2018-04-16 NOTE — Assessment & Plan Note (Signed)
Appears uncontrolled.  Recent cortizone injection.  Pending A1c

## 2018-04-16 NOTE — Assessment & Plan Note (Signed)
History of.  Pending sleep study.

## 2018-04-17 ENCOUNTER — Other Ambulatory Visit: Payer: Self-pay | Admitting: Family

## 2018-04-20 ENCOUNTER — Other Ambulatory Visit: Payer: Self-pay | Admitting: Family

## 2018-04-20 MED ORDER — METFORMIN HCL 500 MG PO TABS: ORAL_TABLET | ORAL | 3 refills | Status: DC

## 2018-04-20 NOTE — Progress Notes (Signed)
close

## 2018-04-25 ENCOUNTER — Ambulatory Visit (INDEPENDENT_AMBULATORY_CARE_PROVIDER_SITE_OTHER): Payer: PPO

## 2018-04-25 VITALS — BP 118/68 | HR 72 | Temp 98.5°F | Resp 14 | Ht 71.0 in | Wt 220.8 lb

## 2018-04-25 DIAGNOSIS — Z0000 Encounter for general adult medical examination without abnormal findings: Secondary | ICD-10-CM | POA: Diagnosis not present

## 2018-04-25 NOTE — Patient Instructions (Addendum)
  Mr. Limb , Thank you for taking time to come for your Medicare Wellness Visit. I appreciate your ongoing commitment to your health goals. Please review the following plan we discussed and let me know if I can assist you in the future.   Follow up as needed.    Bring a copy of your [place] and/or Living Will to be scanned into chart.  Have a great day!  These are the goals we discussed: Goals    . increase lean proteins     Reduce fast food intake  Low carb foods       This is a list of the screening recommended for you and due dates:  Health Maintenance  Topic Date Due  . Eye exam for diabetics  06/12/2018  . Flu Shot  07/05/2018  . Hemoglobin A1C  10/17/2018  . Complete foot exam   04/17/2019  . Colon Cancer Screening  11/15/2020  . Tetanus Vaccine  11/09/2021  . Pneumonia vaccines  Completed  .  Hepatitis C: One time screening is recommended by Center for Disease Control  (CDC) for  adults born from [birth year] through 1964.   Discontinued

## 2018-04-25 NOTE — Progress Notes (Addendum)
Subjective:   Larry Roman. is a 72 y.o. male who presents for Medicare Annual/Subsequent preventive examination.  Review of Systems:  No ROS.  Medicare Wellness Visit. Additional risk factors are reflected in the social history.  Cardiac Risk Factors include: advanced age (>41men, >44 women);hypertension;male gender     Objective:    Vitals: BP 118/68 (BP Location: Left Arm, Patient Position: Sitting, Cuff Size: Normal)   Pulse 72   Temp 98.5 F (36.9 C) (Oral)   Resp 14   Ht 5\' 11"  (1.803 m)   Wt 220 lb 12.8 oz (100.2 kg)   SpO2 95%   BMI 30.80 kg/m   Body mass index is 30.8 kg/m.  Advanced Directives 04/25/2018 04/24/2017 04/22/2016  Does Patient Have a Medical Advance Directive? Yes Yes Yes  Type of Paramedic of [place];Living will [place];Living will Living will;Healthcare Power of Attorney  Does patient want to make changes to medical advance directive? No - Patient declined No - Patient declined -  Copy of [place] in Chart? No - copy requested No - copy requested No - copy requested    Tobacco Social History   Tobacco Use  Smoking Status Never Smoker  Smokeless Tobacco Never Used  Tobacco Comment   tobacco use - no     Counseling given: Not Answered Comment: tobacco use - no   Clinical Intake:  Pre-visit preparation completed: Yes  Pain : No/denies pain     Diabetes: Yes(Followed by pcp)  How often do you need to have someone help you when you read instructions, pamphlets, or other written materials from your doctor or pharmacy?: 1 - Never  Interpreter Needed?: No     Past Medical History:  Diagnosis Date  . Depression   . DM (diabetes mellitus) ([place])   . Extrinsic asthma, unspecified    childhood  . HTN (hypertension)   . Other allergy, other than to medicinal agents    Past Surgical History:  Procedure Laterality Date  . BACK SURGERY    . CATARACT EXTRACTION,  BILATERAL  2016   Dr. Kerman Passey at [facility]  . TONSILLECTOMY     Family History  Problem Relation Age of Onset  . Heart failure Father   . Heart attack Mother    Social History   Socioeconomic History  . Marital status: Married    Spouse name: Not on file  . Number of children: 2  . Years of education: Not on file  . Highest education level: Not on file  Occupational History    Employer: [employer]  Social Needs  . Financial resource strain: Not hard at all  . Food insecurity:    Worry: Never true    Inability: Never true  . Transportation needs:    Medical: No    Non-medical: No  Tobacco Use  . Smoking status: Never Smoker  . Smokeless tobacco: Never Used  . Tobacco comment: tobacco use - no  Substance and Sexual Activity  . Alcohol use: Yes    Alcohol/week: 0.0 oz    Comment: rare  . Drug use: No  . Sexual activity: Yes  Lifestyle  . Physical activity:    Days per week: Not on file    Minutes per session: Not on file  . Stress: Not at all  Relationships  . Social connections:    Talks on phone: Not on file    Gets together: Not on file  Attends religious service: Not on file    Active member of club or organization: Not on file    Attends meetings of clubs or organizations: Not on file    Relationship status: Not on file  Other Topics Concern  . Not on file  Social History Narrative  . Not on file    Outpatient Encounter Medications as of 04/25/2018  Medication Sig  . ACCU-CHEK SOFTCLIX LANCETS lancets Use up to 4 times daily to check blood sugar. Diagnosis E11.9  . amLODipine (NORVASC) 10 MG tablet TAKE 1 TABLET (10 MG TOTAL) BY MOUTH DAILY.  Marland Kitchen aspirin EC 81 MG tablet Take 81 mg by mouth daily.    Marland Kitchen azelastine (ASTELIN) 0.1 % nasal spray USE 1 SPRAY IN EACH NOSTRIL TWICE DAILY AS DIRECTED  . Cholecalciferol (VITAMIN D3) 2000 UNITS TABS Take by mouth.  . Cyanocobalamin (B-12) 5000 MCG SUBL Place under the tongue.  . diclofenac  sodium (VOLTAREN) 1 % GEL Apply 4 g topically 4 (four) times daily.  . DULoxetine (CYMBALTA) 30 MG capsule TAKE 1 CAPSULE BY MOUTH EVERY DAY  . fluconazole (DIFLUCAN) 200 MG tablet TAKE ONE TABLET ONCE WEEKLY  . hydrochlorothiazide (MICROZIDE) 12.5 MG capsule Take 1 capsule (12.5 mg total) by mouth daily.  Marland Kitchen loratadine (CLARITIN) 10 MG tablet Take 10 mg by mouth daily.  Marland Kitchen losartan (COZAAR) 100 MG tablet TAKE 1 TABLET (100 MG TOTAL) BY MOUTH DAILY.  . metFORMIN (GLUCOPHAGE) 500 MG tablet TAKE 1 TABLET (500 MG TOTAL) BY MOUTH 2 (TWO) TIMES DAILY WITH A MEAL.  . metoprolol succinate (TOPROL-XL) 25 MG 24 hr tablet Take 1 tablet (25 mg total) by mouth daily.  Glory Rosebush VERIO test strip TEST 3 TIMES A DAY  . pravastatin (PRAVACHOL) 40 MG tablet TAKE 1 TABLET BY MOUTH EVERY DAY  . ranitidine (ZANTAC) 75 MG tablet Take 75 mg by mouth daily as needed for heartburn.  . tamsulosin (FLOMAX) 0.4 MG CAPS capsule TAKE 1 CAPSULE BY MOUTH EVERY DAY  . traZODone (DESYREL) 50 MG tablet Take 1 tablet (50 mg total) by mouth at bedtime as needed for sleep.  . [DISCONTINUED] amoxicillin (AMOXIL) 500 MG capsule Take 1 capsule (500 mg total) by mouth 2 (two) times daily.   No facility-administered encounter medications on file as of 04/25/2018.     Activities of Daily Living In your present state of health, do you have any difficulty performing the following activities: 04/25/2018  Hearing? N  Vision? N  Difficulty concentrating or making decisions? N  Walking or climbing stairs? Y  Comment unsteady gait  Dressing or bathing? N  Doing errands, shopping? N  Preparing Food and eating ? N  Using the Toilet? N  In the past six months, have you accidently leaked urine? N  Do you have problems with loss of bowel control? N  Managing your Medications? N  Managing your Finances? N  Housekeeping or managing your Housekeeping? N  Some recent data might be hidden    Patient Care Team: Burnard Hawthorne, FNP as PCP  - General (Family Medicine)   Assessment:   This is a routine wellness examination for [facility].  The goal of the wellness visit is to assist the patient how to close the gaps in care and create a preventative care plan for the patient.   The roster of all physicians providing medical care to patient is listed in the Snapshot section of the chart.  Osteoporosis risk reviewed.    Safety issues reviewed;  Smoke and carbon monoxide detectors in the home. No firearms in the home. Wears seatbelts when driving or riding with others. No violence in the home.  They do not have excessive sun exposure.  Discussed the need for sun protection: hats, long sleeves and the use of sunscreen if there is significant sun exposure.  Patient is alert, normal appearance, oriented to person/place/and time.  Correctly identified the president of the [place] and recalls of 3/3 words. Performs simple calculations and can read correct time from watch face. Displays appropriate judgement.  No new identified risk were noted.  No failures at ADL's or IADL's.  Ambulates with cane at times.   BMI- discussed the importance of a healthy diet, water intake and the benefits of aerobic exercise. Educational material provided.   24 hour diet recall: Low carb diet  Dental- UTD.   Eye- Visual acuity not assessed per patient preference since they have regular follow up with the ophthalmologist.  Wears lenses when reading.  Sleep patterns- Sleeps through the night without issues.   Wakes feeling rested.  Health maintenance gaps- closed.  Patient Concerns: None at this time. Follow up with PCP as needed.  Exercise Activities and Dietary recommendations Current Exercise Habits: Home exercise routine, Type of exercise: calisthenics;walking, Time (Minutes): 30, Frequency (Times/Week): 2, Weekly Exercise (Minutes/Week): 60, Intensity: Mild  Goals    . increase lean proteins     Reduce fast food intake  Low carb foods        Fall Risk Fall Risk  04/25/2018 05/10/2017 04/24/2017 05/19/2016 04/22/2016  Falls in the past year? Yes No Yes No No  Number falls in past yr: 1 - 2 or more - -  Injury with Fall? Yes - No - -  Comment - - Tripped on tree root and uneven ground when doing yard work - -  Risk Factor Category  High Fall Risk - - - -  Risk for fall due to : History of fall(s);Impaired balance/gait - - - -  Risk for fall due to: Comment He does not always use a cane when ambulating - - - -  Follow up Education provided;Falls prevention discussed - Falls prevention discussed - -   Depression Screen PHQ 2/9 Scores 04/25/2018 04/24/2017 05/19/2016 04/22/2016  PHQ - 2 Score 0 0 0 0  PHQ- 9 Score - 0 - -    Cognitive Function MMSE - Mini Mental State Exam 04/25/2018 04/24/2017 04/22/2016  Orientation to time 5 5 5   Orientation to Place 5 5 5   Registration 3 3 3   Attention/ Calculation 5 5 5   Recall 3 3 3   Language- name 2 objects 2 2 2   Language- repeat 1 1 1   Language- follow 3 step command 3 3 3   Language- read & follow direction 1 1 1   Write a sentence 1 1 1   Copy design 1 1 1   Total score 30 30 30         Immunization History  Administered Date(s) Administered  . Influenza Split 11/10/2011, 09/10/2012  . Influenza, High Dose Seasonal PF 09/11/2017  . Influenza,inj,Quad PF,6+ Mos 10/02/2013, 08/30/2014, 09/14/2015, 08/25/2016  . Pneumococcal Conjugate-13 01/14/2014  . Pneumococcal Polysaccharide-23 07/16/2012  . Tdap 11/10/2011  . Varicella 11/16/2011  . Zoster 12/01/2011    Screening Tests Health Maintenance  Topic Date Due  . OPHTHALMOLOGY EXAM  06/12/2018  . INFLUENZA VACCINE  07/05/2018  . HEMOGLOBIN A1C  10/17/2018  . FOOT EXAM  04/17/2019  . COLONOSCOPY  11/15/2020  .  TETANUS/TDAP  11/09/2021  . PNA vac Low Risk Adult  Completed  . Hepatitis C Screening  Discontinued      Plan:    End of life planning; Advance aging; Advanced directives discussed. Copy of current HCPOA/Living  Will requested.    I have personally reviewed and noted the following in the patient's chart:   . Medical and social history . Use of alcohol, tobacco or illicit drugs  . Current medications and supplements . Functional ability and status . Nutritional status . Physical activity . Advanced directives . List of other physicians . Hospitalizations, surgeries, and ER visits in previous 12 months . Vitals . Screenings to include cognitive, depression, and falls . Referrals and appointments  In addition, I have reviewed and discussed with patient certain preventive protocols, quality metrics, and best practice recommendations. A written personalized care plan for preventive services as well as general preventive health recommendations were provided to patient.     Varney Biles, LPN  [id number]  Agree with plan. Mable Paris, NP

## 2018-05-19 ENCOUNTER — Other Ambulatory Visit: Payer: Self-pay | Admitting: Family

## 2018-06-14 DIAGNOSIS — X32XXXA Exposure to sunlight, initial encounter: Secondary | ICD-10-CM | POA: Diagnosis not present

## 2018-06-14 DIAGNOSIS — B351 Tinea unguium: Secondary | ICD-10-CM | POA: Diagnosis not present

## 2018-06-14 DIAGNOSIS — L578 Other skin changes due to chronic exposure to nonionizing radiation: Secondary | ICD-10-CM | POA: Diagnosis not present

## 2018-06-14 DIAGNOSIS — L814 Other melanin hyperpigmentation: Secondary | ICD-10-CM | POA: Diagnosis not present

## 2018-06-18 ENCOUNTER — Other Ambulatory Visit: Payer: Self-pay | Admitting: Family

## 2018-07-07 ENCOUNTER — Other Ambulatory Visit: Payer: Self-pay | Admitting: Family

## 2018-07-07 DIAGNOSIS — F339 Major depressive disorder, recurrent, unspecified: Secondary | ICD-10-CM

## 2018-07-07 DIAGNOSIS — I10 Essential (primary) hypertension: Secondary | ICD-10-CM

## 2018-07-18 DIAGNOSIS — M170 Bilateral primary osteoarthritis of knee: Secondary | ICD-10-CM | POA: Diagnosis not present

## 2018-07-20 ENCOUNTER — Other Ambulatory Visit: Payer: Self-pay | Admitting: Orthopedic Surgery

## 2018-07-25 DIAGNOSIS — Z6832 Body mass index (BMI) 32.0-32.9, adult: Secondary | ICD-10-CM | POA: Diagnosis not present

## 2018-07-25 DIAGNOSIS — I10 Essential (primary) hypertension: Secondary | ICD-10-CM | POA: Diagnosis not present

## 2018-07-25 DIAGNOSIS — M5416 Radiculopathy, lumbar region: Secondary | ICD-10-CM | POA: Diagnosis not present

## 2018-07-26 ENCOUNTER — Other Ambulatory Visit: Payer: Self-pay | Admitting: Family

## 2018-07-26 DIAGNOSIS — I10 Essential (primary) hypertension: Secondary | ICD-10-CM

## 2018-07-30 ENCOUNTER — Telehealth: Payer: Self-pay

## 2018-07-30 NOTE — Telephone Encounter (Signed)
Received pre-op paperwork from [facility] for right total knee arthoplasty  Surgery scheduled 08/08/18 Last ekg 01/17/18 Last office visit 04/16/18 ? Can you fill out pre-op paperwork without seeing patient  Form placed in your folder

## 2018-07-30 NOTE — Telephone Encounter (Signed)
He may take same day I dont have hospital f/u ?

## 2018-07-30 NOTE — Telephone Encounter (Signed)
The only appointments you have are same day appointments ? Surgery is scheduled for 08/08/18 . Please advise.

## 2018-07-30 NOTE — Telephone Encounter (Signed)
Appointment scheduled 08/15/18 , OV scheduled

## 2018-07-30 NOTE — Telephone Encounter (Signed)
Call pt I would be  Most comfortable seeing him in office to f/u on HTN and we can also do a1c at that time.  As long as all stable, I would be able to sign pre op form

## 2018-08-03 DIAGNOSIS — M47816 Spondylosis without myelopathy or radiculopathy, lumbar region: Secondary | ICD-10-CM | POA: Diagnosis not present

## 2018-08-03 DIAGNOSIS — M5416 Radiculopathy, lumbar region: Secondary | ICD-10-CM | POA: Diagnosis not present

## 2018-08-05 DIAGNOSIS — S81801A Unspecified open wound, right lower leg, initial encounter: Secondary | ICD-10-CM

## 2018-08-05 HISTORY — DX: Unspecified open wound, right lower leg, initial encounter: S81.801A

## 2018-08-09 ENCOUNTER — Other Ambulatory Visit: Payer: Self-pay

## 2018-08-09 ENCOUNTER — Ambulatory Visit
Admission: RE | Admit: 2018-08-09 | Discharge: 2018-08-09 | Disposition: A | Discharge status: Home / Self Care | Payer: PPO | Source: Ambulatory Visit | Attending: Orthopedic Surgery | Admitting: Orthopedic Surgery

## 2018-08-09 ENCOUNTER — Encounter
Admission: RE | Admit: 2018-08-09 | Discharge: 2018-08-09 | Disposition: A | Discharge status: Home / Self Care | Payer: PPO | Source: Ambulatory Visit | Attending: Orthopedic Surgery | Admitting: Orthopedic Surgery

## 2018-08-09 ENCOUNTER — Telehealth: Payer: Self-pay

## 2018-08-09 DIAGNOSIS — I4510 Unspecified right bundle-branch block: Secondary | ICD-10-CM | POA: Insufficient documentation

## 2018-08-09 DIAGNOSIS — Z01811 Encounter for preprocedural respiratory examination: Secondary | ICD-10-CM | POA: Diagnosis not present

## 2018-08-09 DIAGNOSIS — R9431 Abnormal electrocardiogram [ECG] [EKG]: Secondary | ICD-10-CM | POA: Diagnosis not present

## 2018-08-09 DIAGNOSIS — R0989 Other specified symptoms and signs involving the circulatory and respiratory systems: Secondary | ICD-10-CM | POA: Diagnosis not present

## 2018-08-09 DIAGNOSIS — Z01810 Encounter for preprocedural cardiovascular examination: Secondary | ICD-10-CM | POA: Diagnosis not present

## 2018-08-09 HISTORY — DX: Urticaria, unspecified: L50.9

## 2018-08-09 HISTORY — DX: Unspecified right bundle-branch block: I45.10

## 2018-08-09 HISTORY — DX: Sleep apnea, unspecified: G47.30

## 2018-08-09 LAB — BASIC METABOLIC PANEL
Anion gap: 8 (ref 5–15)
BUN: 26 mg/dL — AB (ref 8–23)
CHLORIDE: 101 mmol/L (ref 98–111)
CO2: 30 mmol/L (ref 22–32)
CREATININE: 0.74 mg/dL (ref 0.61–1.24)
Calcium: 9.6 mg/dL (ref 8.9–10.3)
GFR calc Af Amer: >60 mL/min (ref >60)
GFR calc non Af Amer: >60 mL/min (ref >60)
GLUCOSE: 102 mg/dL — AB (ref 70–99)
POTASSIUM: 3.8 mmol/L (ref 3.5–5.1)
SODIUM: 139 mmol/L (ref 135–145)

## 2018-08-09 LAB — HEMOGLOBIN A1C
HEMOGLOBIN A1C: 6.6 % — AB (ref 4.8–5.6)
Mean Plasma Glucose: 142.72 mg/dL

## 2018-08-09 LAB — CBC WITH DIFFERENTIAL/PLATELET
Basophils Absolute: 0.1 10*3/uL (ref 0–0.1)
Basophils Relative: 1 %
EOS ABS: 0.1 10*3/uL (ref 0–0.7)
EOS PCT: 1 %
HCT: 40.4 % (ref 40.0–52.0)
Hemoglobin: 14 g/dL (ref 13.0–18.0)
LYMPHS ABS: 1.7 10*3/uL (ref 1.0–3.6)
LYMPHS PCT: 18 %
MCH: 32.3 pg (ref 26.0–34.0)
MCHC: 34.6 g/dL (ref 32.0–36.0)
MCV: 93.4 fL (ref 80.0–100.0)
MONO ABS: 0.8 10*3/uL (ref 0.2–1.0)
MONOS PCT: 8 %
Neutro Abs: 7 10*3/uL — ABNORMAL HIGH (ref 1.4–6.5)
Neutrophils Relative %: 72 %
Platelets: 313 10*3/uL (ref 150–440)
RBC: 4.32 MIL/uL — AB (ref 4.40–5.90)
RDW: 12.9 % (ref 11.5–14.5)
WBC: 9.6 10*3/uL (ref 3.8–10.6)

## 2018-08-09 LAB — TYPE AND SCREEN
ABO/RH(D): B POS
ANTIBODY SCREEN: NEGATIVE

## 2018-08-09 LAB — URINALYSIS, ROUTINE W REFLEX MICROSCOPIC
Bacteria, UA: NONE SEEN
Bilirubin Urine: NEGATIVE
GLUCOSE, UA: NEGATIVE mg/dL
KETONES UR: NEGATIVE mg/dL
Nitrite: NEGATIVE
PH: 5 (ref 5.0–8.0)
Protein, ur: 30 mg/dL — AB
Specific Gravity, Urine: 1.026 (ref 1.005–1.030)
Squamous Epithelial / LPF: NONE SEEN (ref 0–5)

## 2018-08-09 LAB — APTT: aPTT: 29 seconds (ref 24–36)

## 2018-08-09 LAB — PROTIME-INR
INR: 0.93
Prothrombin Time: 12.4 seconds (ref 11.4–15.2)

## 2018-08-09 LAB — SURGICAL PCR SCREEN
MRSA, PCR: NEGATIVE
Staphylococcus aureus: NEGATIVE

## 2018-08-09 NOTE — Telephone Encounter (Signed)
Copied from [place]. Topic: Quick Communication - Appointment Cancellation >> Aug 09, 2018  4:12 PM Selinda Flavin B, [place] wrote: Patient called to cancel appointment scheduled for 08/15/18. Patient has not rescheduled their appointment.-- no available 30 mins slots before his surgery on 08/23/18--  Route to department's PEC pool.

## 2018-08-09 NOTE — Patient Instructions (Signed)
Your procedure is scheduled on: Thursday 08/23/18 Report to [place]. To find out your arrival time please call [phone] between 1PM - 3PM on Wednesday 08/22/18.  Remember: Instructions that are not followed completely may result in serious medical risk, up to and including death, or upon the discretion of your surgeon and anesthesiologist your surgery may need to be rescheduled.     _X__ 1. Do not eat food after midnight the night before your procedure.                 No gum chewing or hard candies. You may drink clear liquids up to 2 hours                 before you are scheduled to arrive for your surgery- DO not drink clear                 liquids within 2 hours of the start of your surgery.                 Clear Liquids include:  water, apple juice without pulp, clear carbohydrate                 drink such as Clearfast or Gatorade, Black Coffee or Tea (Do not add                 anything to coffee or tea).  __X__2.  On the morning of surgery brush your teeth with toothpaste and water, you                 may rinse your mouth with mouthwash if you wish.  Do not swallow any              toothpaste of mouthwash.     _X__ 3.  No Alcohol for 24 hours before or after surgery.   _X__ 4.  Do Not Smoke or use e-cigarettes For 24 Hours Prior to Your Surgery.                 Do not use any chewable tobacco products for at least 6 hours prior to                 surgery.  ____  5.  Bring all medications with you on the day of surgery if instructed.   __X__  6.  Notify your doctor if there is any change in your medical condition      (cold, fever, infections).     Do not wear jewelry, make-up, hairpins, clips or nail polish. Do not wear lotions, powders, or perfumes.  Do not shave 48 hours prior to surgery. Men may shave face and neck. Do not bring valuables to the hospital.    [facility] is not responsible for any belongings or  valuables.  Contacts, dentures/partials or body piercings may not be worn into surgery. Bring a case for your contacts, glasses or hearing aids, a denture cup will be supplied. Leave your suitcase in the car. After surgery it may be brought to your room. For patients admitted to the hospital, discharge time is determined by your treatment team.   Patients discharged the day of surgery will not be allowed to drive home.   Please read over the following fact sheets that you were given:   MRSA Information  __X__ Take these medicines the morning of surgery with A SIP OF WATER:  1. amLODipine (NORVASC)  2. DULoxetine (CYMBALTA)  3. loratadine (CLARITIN)  4. metoprolol succinate (TOPROL-XL)   5. pravastatin (PRAVACHOL)    6. ranitidine (ZANTAC)  7. tamsulosin (FLOMAX)  ____ Fleet Enema (as directed)   __X__ Use CHG Soap/SAGE wipes as directed  ____ Use inhalers on the day of surgery  __X__ Stop metformin/Janumet/Farxiga 2 days prior to surgery No metformin 9/17-9/18   ____ Take 1/2 of usual insulin dose the night before surgery. No insulin the morning          of surgery.   ____ Stop Blood Thinners Coumadin/Plavix/Xarelto/Pleta/Pradaxa/Eliquis/Effient/Aspirin  on   Or contact your Surgeon, Cardiologist or Medical Doctor regarding  ability to stop your blood thinners  __X__ Stop Anti-inflammatories 7 days before surgery such as Advil, Ibuprofen, Motrin,  BC or Goodies Powder, Naprosyn, Naproxen, Aleve, Aspirin    __X__ Stop all herbal supplements, fish oil or vitamin E until after surgery.    ____ Bring C-Pap to the hospital.

## 2018-08-09 NOTE — Pre-Procedure Instructions (Signed)
Labs faxed to dr Mack Guise

## 2018-08-10 NOTE — Telephone Encounter (Signed)
He may take a 15 min

## 2018-08-14 NOTE — Telephone Encounter (Signed)
Spoke with patient he states he can come on 08/10/18 @10 :00am for surgery clearance. Ok per [facility] . Can you please place on schedule. Thanks.

## 2018-08-14 NOTE — Telephone Encounter (Signed)
The patient has been scheduled

## 2018-08-15 ENCOUNTER — Ambulatory Visit: Payer: PPO | Admitting: Family

## 2018-08-20 ENCOUNTER — Ambulatory Visit (INDEPENDENT_AMBULATORY_CARE_PROVIDER_SITE_OTHER): Payer: PPO | Admitting: Family

## 2018-08-20 ENCOUNTER — Encounter: Payer: Self-pay | Admitting: Family

## 2018-08-20 VITALS — BP 128/75 | HR 72 | Temp 98.6°F | Resp 16 | Ht 71.0 in | Wt 216.4 lb

## 2018-08-20 DIAGNOSIS — I4510 Unspecified right bundle-branch block: Secondary | ICD-10-CM | POA: Diagnosis not present

## 2018-08-20 DIAGNOSIS — E119 Type 2 diabetes mellitus without complications: Secondary | ICD-10-CM

## 2018-08-20 DIAGNOSIS — I10 Essential (primary) hypertension: Secondary | ICD-10-CM | POA: Diagnosis not present

## 2018-08-20 LAB — COMPREHENSIVE METABOLIC PANEL
ALT: 15 U/L (ref 0–53)
AST: 13 U/L (ref 0–37)
Albumin: 4.5 g/dL (ref 3.5–5.2)
Alkaline Phosphatase: 60 U/L (ref 39–117)
BUN: 17 mg/dL (ref 6–23)
CHLORIDE: 97 meq/L (ref 96–112)
CO2: 32 meq/L (ref 19–32)
CREATININE: 0.83 mg/dL (ref 0.40–1.50)
Calcium: 9.9 mg/dL (ref 8.4–10.5)
GFR: 97.04 mL/min (ref >60.00)
GLUCOSE: 115 mg/dL — AB (ref 70–99)
POTASSIUM: 3.7 meq/L (ref 3.5–5.1)
SODIUM: 136 meq/L (ref 135–145)
Total Bilirubin: 1 mg/dL (ref 0.2–1.2)
Total Protein: 7.3 g/dL (ref 6.0–8.3)

## 2018-08-20 NOTE — Progress Notes (Signed)
Subjective:    Patient ID: Larry Ramus., male    DOB: [birth year], 72 y.o.   MRN: [id number]  CC: Larry Stankus. is a 72 y.o. male who presents today for follow up.   HPI: Feels well today.   No concerns today. Left knee replacement scheduled for 08/23/18 with Dr Nicola Police. Advised to hold metformin for surgert  Difficult to exercise due to knee pain however can do stairs without sob, chest pain. No CP with usual physical activity.   HTN- compliant with medication. Denies exertional chest pain or pressure, numbness or tingling radiating to left arm or jaw, palpitations, dizziness, frequent headaches, changes in vision, or shortness of breath.   DM- compliant with medication  No cough.      [place] 2013 for new RBBB.  DM- a1c 6.6 ( from 6.9).  EKG ordered by pre admission testing UA negative for blod 08/09/18 HISTORY:  Past Medical History:  Diagnosis Date  . Depression   . DM (diabetes mellitus) ([place])   . Extrinsic asthma, unspecified    childhood  . Hives of unknown origin   . HTN (hypertension)   . Other allergy, other than to medicinal agents   . RBBB   . Sleep apnea    25 years ago mild lost weight no longer uses cpap   Past Surgical History:  Procedure Laterality Date  . BACK SURGERY     ruptured disc encapsulated nerves  . CATARACT EXTRACTION, BILATERAL  2016   Dr. Kerman [facility] at [facility]  . TONSILLECTOMY     Family History  Problem Relation Age of Onset  . Heart failure Father   . Heart attack Mother     Allergies: Patient has no known allergies. Current Outpatient Medications on File Prior to Visit  Medication Sig Dispense Refill  . ACCU-CHEK SOFTCLIX LANCETS lancets Use up to 4 times daily to check blood sugar. Diagnosis E11.9 100 each 12  . acetaminophen (TYLENOL) 650 MG CR tablet Take 1,300 mg by mouth daily.    Marland Kitchen amLODipine (NORVASC) 10 MG tablet TAKE 1 TABLET (10 MG TOTAL) BY MOUTH DAILY. (Patient taking differently: Take 10 mg by  mouth daily with lunch. ) 90 tablet 2  . aspirin EC 81 MG tablet Take 81 mg by mouth daily.      Marland Kitchen azelastine (ASTELIN) 0.1 % nasal spray USE 1 SPRAY IN EACH NOSTRIL TWICE DAILY AS DIRECTED (Patient taking differently: Place 1 spray into both nostrils 2 (two) times daily as needed for rhinitis or allergies. ) 30 mL 6  . camphor-menthol (SARNA) lotion Apply 1 application topically as needed for itching.    . Cholecalciferol (VITAMIN D3) 2000 UNITS TABS Take 2,000 Units by mouth daily.     . Cyanocobalamin (B-12) 5000 MCG SUBL Place 5,000 mcg under the tongue daily.     . DULoxetine (CYMBALTA) 30 MG capsule TAKE 1 CAPSULE BY MOUTH EVERY DAY 90 capsule 1  . fluconazole (DIFLUCAN) 200 MG tablet Take 200 mg by mouth every Monday.   3  . hydrochlorothiazide (MICROZIDE) 12.5 MG capsule TAKE 1 CAPSULE BY MOUTH EVERY DAY 90 capsule 1  . loratadine (CLARITIN) 10 MG tablet Take 10 mg by mouth daily.    Marland Kitchen losartan (COZAAR) 100 MG tablet TAKE 1 TABLET BY MOUTH EVERY DAY 90 tablet 3  . metFORMIN (GLUCOPHAGE) 500 MG tablet TAKE 1 TABLET (500 MG TOTAL) BY MOUTH 2 (TWO) TIMES DAILY WITH A MEAL. 180 tablet 3  .  metoprolol succinate (TOPROL-XL) 25 MG 24 hr tablet TAKE 1 TABLET BY MOUTH EVERY DAY 90 tablet 1  . Naphazoline-Glycerin (REDNESS RELIEF OP) Place 1 drop into both eyes daily as needed (redness).    . neomycin-bacitracin-polymyxin (NEOSPORIN) ointment Apply 1 application topically as needed for wound care.    Glory Rosebush VERIO test strip TEST 3 TIMES A DAY 100 each 2  . pravastatin (PRAVACHOL) 40 MG tablet TAKE 1 TABLET BY MOUTH EVERY DAY 90 tablet 2  . ranitidine (ZANTAC) 75 MG tablet Take 75 mg by mouth daily.     . tamsulosin (FLOMAX) 0.4 MG CAPS capsule TAKE 1 CAPSULE BY MOUTH EVERY DAY (Patient taking differently: Take 0.4 mg by mouth daily with lunch. ) 90 capsule 1  . traMADol (ULTRAM) 50 MG tablet Take 50 mg by mouth 2 (two) times daily as needed for pain.  0  . traZODone (DESYREL) 50 MG tablet TAKE  1 TABLET (50 MG TOTAL) BY MOUTH AT BEDTIME AS NEEDED FOR SLEEP. 90 tablet 1   No current facility-administered medications on file prior to visit.     Social History   Tobacco Use  . Smoking status: Never Smoker  . Smokeless tobacco: Never Used  . Tobacco comment: tobacco use - no  Substance Use Topics  . Alcohol use: Yes    Alcohol/week: 0.0 standard drinks    Comment: rare  . Drug use: No    Review of Systems  Constitutional: Negative for chills and fever.  Respiratory: Negative for cough and shortness of breath.   Cardiovascular: Negative for chest pain and palpitations.  Gastrointestinal: Negative for nausea and vomiting.  Musculoskeletal: Positive for arthralgias (left knee).      Objective:    BP 128/75   Pulse 72   Temp 98.6 F (37 C) (Oral)   Resp 16   Ht 5\' 11"  (1.803 m)   Wt 216 lb 6 oz (98.1 kg)   SpO2 97%   BMI 30.18 kg/m  BP Readings from Last 3 Encounters:  08/20/18 128/75  08/09/18 (!) 148/78  04/25/18 118/68   Wt Readings from Last 3 Encounters:  08/20/18 216 lb 6 oz (98.1 kg)  08/09/18 217 lb (98.4 kg)  04/25/18 220 lb 12.8 oz (100.2 kg)    Physical Exam  Constitutional: He appears well-developed and well-nourished.  Cardiovascular: Regular rhythm and normal heart sounds.  Pulmonary/Chest: Effort normal and breath sounds normal. No respiratory distress. He has no wheezes. He has no rhonchi. He has no rales.  Lymphadenopathy:       Head (left side): No submandibular and no preauricular adenopathy present.  Neurological: He is alert.  Skin: Skin is warm and dry.  Psychiatric: He has a normal mood and affect. His speech is normal and behavior is normal.  Vitals reviewed.      Assessment & Plan:   Problem List Items Addressed This Visit      Cardiovascular and Mediastinum   Hypertension    Well-controlled.  Continue current regimen      Relevant Orders   Comprehensive metabolic panel   Right bundle branch block - Primary     Stable. R BBB since 2013.  Asymptomatic.  EKG from August 09, 2018, showed right bundle branch block as it had January 17, 2018 which I performed.  No acute ischemia.  Completed surgical clearance form and advised the patient is at moderate risk based on his history of hypertension, diabetes, right bundle branch block.  At this time, I am  comfortable with him pursuing surgery with the absence of any complaints today.  Reviewed lab work done for preadmission, which was normal.  I have included a CMP as albumin was not done.  As long as this is normal, will fax over paperwork patient to have surgery.        Endocrine   Diabetes mellitus type 2, controlled ([place])    Well-controlled.  Continue current regimen.  Advised to hold metformin the dsy prior to surgery which patient already aware of          I have discontinued Larry Hurter Jr.'s diclofenac sodium. I am also having him maintain his aspirin EC, ranitidine, loratadine, Vitamin D3, B-12, ACCU-CHEK SOFTCLIX LANCETS, amLODipine, fluconazole, ONETOUCH VERIO, pravastatin, DULoxetine, azelastine, metFORMIN, losartan, tamsulosin, traZODone, hydrochlorothiazide, metoprolol succinate, acetaminophen, traMADol, Naphazoline-Glycerin (REDNESS RELIEF OP), camphor-menthol, and neomycin-bacitracin-polymyxin.   No orders of the defined types were placed in this encounter.   Return precautions given.   Risks, benefits, and alternatives of the medications and treatment plan prescribed today were discussed, and patient expressed understanding.   Education regarding symptom management and diagnosis given to patient on AVS.  Continue to follow with Burnard Hawthorne, FNP for routine health maintenance.   [place] agreed with plan.   Mable [place], FNP

## 2018-08-20 NOTE — Assessment & Plan Note (Addendum)
Well-controlled.  Continue current regimen.  Advised to hold metformin the dsy prior to surgery which patient already aware of

## 2018-08-20 NOTE — Patient Instructions (Addendum)
  Wish you a good surgery and recovery

## 2018-08-20 NOTE — Assessment & Plan Note (Signed)
Stable. R BBB since 2013.  Asymptomatic.  EKG from August 09, 2018, showed right bundle branch block as it had January 17, 2018 which I performed.  No acute ischemia.  Completed surgical clearance form and advised the patient is at moderate risk based on his history of hypertension, diabetes, right bundle branch block.  At this time, I am comfortable with him pursuing surgery with the absence of any complaints today.  Reviewed lab work done for preadmission, which was normal.  I have included a CMP as albumin was not done.  As long as this is normal, will fax over paperwork patient to have surgery.

## 2018-08-20 NOTE — Assessment & Plan Note (Signed)
Well-controlled.  Continue current regimen. 

## 2018-08-22 ENCOUNTER — Encounter: Payer: Self-pay | Admitting: Anesthesiology

## 2018-08-22 MED ORDER — CEFAZOLIN SODIUM-DEXTROSE 2-4 GM/100ML-% IV SOLN: 2.0000 g | INTRAVENOUS | Status: DC

## 2018-08-22 MED ORDER — CLINDAMYCIN PHOSPHATE 600 MG/50ML IV SOLN: 600.0000 mg | Freq: Once | INTRAVENOUS | Status: DC

## 2018-08-23 ENCOUNTER — Ambulatory Visit
Admission: RE | Admit: 2018-08-23 | Discharge: 2018-08-23 | Disposition: A | Discharge status: Home / Self Care | Payer: PPO | Source: Ambulatory Visit | Attending: Orthopedic Surgery | Admitting: Orthopedic Surgery

## 2018-08-23 ENCOUNTER — Encounter: Payer: Self-pay | Admitting: *Deleted

## 2018-08-23 ENCOUNTER — Encounter
Admission: RE | Disposition: A | Discharge status: Home / Self Care | Payer: Self-pay | Source: Ambulatory Visit | Attending: Orthopedic Surgery

## 2018-08-23 DIAGNOSIS — Z7982 Long term (current) use of aspirin: Secondary | ICD-10-CM | POA: Diagnosis not present

## 2018-08-23 DIAGNOSIS — Z7984 Long term (current) use of oral hypoglycemic drugs: Secondary | ICD-10-CM | POA: Diagnosis not present

## 2018-08-23 DIAGNOSIS — F329 Major depressive disorder, single episode, unspecified: Secondary | ICD-10-CM | POA: Insufficient documentation

## 2018-08-23 DIAGNOSIS — I10 Essential (primary) hypertension: Secondary | ICD-10-CM | POA: Diagnosis not present

## 2018-08-23 DIAGNOSIS — E119 Type 2 diabetes mellitus without complications: Secondary | ICD-10-CM | POA: Insufficient documentation

## 2018-08-23 DIAGNOSIS — M1712 Unilateral primary osteoarthritis, left knee: Secondary | ICD-10-CM | POA: Diagnosis not present

## 2018-08-23 DIAGNOSIS — Z79899 Other long term (current) drug therapy: Secondary | ICD-10-CM | POA: Insufficient documentation

## 2018-08-23 DIAGNOSIS — Z538 Procedure and treatment not carried out for other reasons: Secondary | ICD-10-CM | POA: Insufficient documentation

## 2018-08-23 HISTORY — DX: Unspecified open wound, right lower leg, initial encounter: S81.801A

## 2018-08-23 LAB — GLUCOSE, CAPILLARY: GLUCOSE-CAPILLARY: 130 mg/dL — AB (ref 70–99)

## 2018-08-23 SURGERY — ARTHROPLASTY, KNEE, TOTAL
Anesthesia: Choice | Laterality: Right

## 2018-08-23 MED ORDER — CHLORHEXIDINE GLUCONATE CLOTH 2 % EX PADS: 6.0000 | MEDICATED_PAD | Freq: Once | CUTANEOUS | Status: DC

## 2018-08-23 MED ORDER — SODIUM CHLORIDE 0.9 % IV SOLN: INTRAVENOUS | Status: DC

## 2018-08-23 SURGICAL SUPPLY — 57 items
BLADE SAW 90X13X1.19 OSCILLAT (BLADE) ×3 IMPLANT
BLADE SAW 90X25X1.19 OSCILLAT (BLADE) ×3 IMPLANT
CANISTER SUCT 1200ML W/VALVE (MISCELLANEOUS) ×3 IMPLANT
CANISTER SUCT 3000ML PPV (MISCELLANEOUS) ×6 IMPLANT
CNTNR SPEC 2.5X3XGRAD LEK (MISCELLANEOUS) ×1
CONT SPEC 4OZ STER OR WHT (MISCELLANEOUS) ×2
CONTAINER SPEC 2.5X3XGRAD LEK (MISCELLANEOUS) ×1 IMPLANT
COOLER POLAR GLACIER W/PUMP (MISCELLANEOUS) ×3 IMPLANT
CUFF TOURN 24 STER (MISCELLANEOUS) IMPLANT
CUFF TOURN 30 STER DUAL PORT (MISCELLANEOUS) IMPLANT
DRAPE IMP U-DRAPE 54X76 (DRAPES) ×3 IMPLANT
DRAPE INCISE IOBAN 66X60 STRL (DRAPES) ×3 IMPLANT
DRAPE SHEET LG 3/4 BI-LAMINATE (DRAPES) ×6 IMPLANT
DRAPE SURG 17X11 SM STRL (DRAPES) ×6 IMPLANT
DRSG OPSITE POSTOP 4X12 (GAUZE/BANDAGES/DRESSINGS) ×3 IMPLANT
DRSG OPSITE POSTOP 4X14 (GAUZE/BANDAGES/DRESSINGS) ×3 IMPLANT
DURAPREP 26ML APPLICATOR (WOUND CARE) ×9 IMPLANT
ELECT REM PT RETURN 9FT ADLT (ELECTROSURGICAL) ×3
ELECTRODE REM PT RTRN 9FT ADLT (ELECTROSURGICAL) ×1 IMPLANT
GAUZE SPONGE 4X4 12PLY STRL (GAUZE/BANDAGES/DRESSINGS) ×3 IMPLANT
GLOVE BIOGEL PI IND STRL 9 (GLOVE) ×1 IMPLANT
GLOVE BIOGEL PI INDICATOR 9 (GLOVE) ×2
GLOVE SURG 9.0 ORTHO LTXF (GLOVE) ×6 IMPLANT
GOWN STRL REUS TWL 2XL XL LVL4 (GOWN DISPOSABLE) ×3 IMPLANT
GOWN STRL REUS W/ TWL LRG LVL3 (GOWN DISPOSABLE) ×1 IMPLANT
GOWN STRL REUS W/ TWL LRG LVL4 (GOWN DISPOSABLE) ×1 IMPLANT
GOWN STRL REUS W/TWL LRG LVL3 (GOWN DISPOSABLE) ×2
GOWN STRL REUS W/TWL LRG LVL4 (GOWN DISPOSABLE) ×2
HOLDER FOLEY CATH W/STRAP (MISCELLANEOUS) ×3 IMPLANT
IMMBOLIZER KNEE 19 BLUE UNIV (SOFTGOODS) ×3 IMPLANT
KIT TURNOVER KIT A (KITS) ×3 IMPLANT
NDL SAFETY ECLIPSE 18X1.5 (NEEDLE) ×1 IMPLANT
NEEDLE HYPO 18GX1.5 SHARP (NEEDLE) ×2
NEEDLE HYPO 22GX1.5 SAFETY (NEEDLE) ×3 IMPLANT
NEEDLE SPNL 20GX3.5 QUINCKE YW (NEEDLE) ×3 IMPLANT
NS IRRIG 1000ML POUR BTL (IV SOLUTION) ×3 IMPLANT
PACK TOTAL KNEE (MISCELLANEOUS) ×3 IMPLANT
PAD PREP 24X41 OB/GYN DISP (PERSONAL CARE ITEMS) ×3 IMPLANT
PAD WRAPON POLAR KNEE (MISCELLANEOUS) ×1 IMPLANT
PULSAVAC PLUS IRRIG FAN TIP (DISPOSABLE) ×3
SOL .9 NS 3000ML IRR  AL (IV SOLUTION) ×2
SOL .9 NS 3000ML IRR UROMATIC (IV SOLUTION) ×1 IMPLANT
SPONGE DRAIN TRACH 4X4 STRL 2S (GAUZE/BANDAGES/DRESSINGS) ×3 IMPLANT
SPONGE LAP 18X18 RF (DISPOSABLE) IMPLANT
STAPLER SKIN PROX 35W (STAPLE) ×3 IMPLANT
SUCTION FRAZIER HANDLE 10FR (MISCELLANEOUS) ×2
SUCTION TUBE FRAZIER 10FR DISP (MISCELLANEOUS) ×1 IMPLANT
SUT ETHIBOND NAB CT1 #1 30IN (SUTURE) ×6 IMPLANT
SUT VIC AB 0 CT1 36 (SUTURE) ×3 IMPLANT
SUT VIC AB 2-0 CT1 (SUTURE) ×6 IMPLANT
SYR 20CC LL (SYRINGE) ×3 IMPLANT
SYR 30ML LL (SYRINGE) ×6 IMPLANT
TIP FAN IRRIG PULSAVAC PLUS (DISPOSABLE) ×1 IMPLANT
TOWER CARTRIDGE SMART MIX (DISPOSABLE) ×3 IMPLANT
TRAY FOLEY MTR SLVR 16FR STAT (SET/KITS/TRAYS/PACK) ×3 IMPLANT
TUBE SUCT KAM VAC (TUBING) ×3 IMPLANT
WRAPON POLAR PAD KNEE (MISCELLANEOUS) ×3

## 2018-08-23 NOTE — OR Nursing (Signed)
Patient here today for right TKR. He walked into a trailer hitch a few days ago and has a wound the size of a quarter just under his right knee.  It is raw and open but not draining.  The area around the sore is reddened.  He has been using Neosporin on this area.  Notified Dr. Mack Guise via phone and he said he would be here to assess the situation to determine if he is still appropriate for surgery.  Dr. Mack Guise evaluated sore and determined that surgery should be cancelled for today. Spoke with patient and wife to explain the ongoing process for healing.  He will call in an antibiotic to enhance healing. Patient discharged home with wife, ambulatory.

## 2018-09-11 DIAGNOSIS — M170 Bilateral primary osteoarthritis of knee: Secondary | ICD-10-CM | POA: Diagnosis not present

## 2018-09-12 ENCOUNTER — Other Ambulatory Visit: Payer: Self-pay | Admitting: Orthopedic Surgery

## 2018-09-18 ENCOUNTER — Encounter: Payer: Self-pay | Admitting: Family

## 2018-09-18 NOTE — H&P (Addendum)
PREOPERATIVE H&P  Chief Complaint: OSTEOARTHRITIS OF RIGHT KNEE  HPI: Larry Roman. is a 72 y.o. male who presents for preoperative history and physical with a diagnosis of OSTEOARTHRITIS OF RIGHT KNEE. Symptoms are rated as moderate to severe, and have been worsening.  This is significantly impairing activities of daily living.  He has elected for surgical management.   Patient was noted by nursing staff to have an open wound over the anterior knee near the tibial tuberosity.   He states he hit his leg on a trailer hitch.  He is seen in pre-op area with his wife.  Past Medical History:  Diagnosis Date  . Depression   . DM (diabetes mellitus) ([place])   . Extrinsic asthma, unspecified    childhood  . Hives of unknown origin   . HTN (hypertension)   . Other allergy, other than to medicinal agents   . RBBB   . Sleep apnea    25 years ago mild lost weight no longer uses cpap  . Wound of right leg 08/2018   area just below knee size of quarter, reddened around edges   Past Surgical History:  Procedure Laterality Date  . BACK SURGERY  1995   ruptured disc encapsulated nerves  . CATARACT EXTRACTION, BILATERAL  2016   Dr. Kerman Passey at [facility]  . EYE SURGERY Bilateral    cataract extractions  . TONSILLECTOMY     Social History   Socioeconomic History  . Marital status: Married    Spouse name: Pam  . Number of children: 2  . Years of education: Not on file  . Highest education level: Not on file  Occupational History    Employer: [employer]    Comment: retired  Scientific laboratory technician  . Financial resource strain: Not hard at all  . Food insecurity:    Worry: Never true    Inability: Never true  . Transportation needs:    Medical: No    Non-medical: No  Tobacco Use  . Smoking status: Never Smoker  . Smokeless tobacco: Never Used  . Tobacco comment: tobacco use - no  Substance and Sexual Activity  . Alcohol use: Yes    Alcohol/week: 0.0 standard drinks     Comment: rare  . Drug use: No  . Sexual activity: Yes  Lifestyle  . Physical activity:    Days per week: Not on file    Minutes per session: Not on file  . Stress: Not at all  Relationships  . Social connections:    Talks on phone: Not on file    Gets together: Not on file    Attends religious service: Not on file    Active member of club or organization: Not on file    Attends meetings of clubs or organizations: Not on file    Relationship status: Not on file  Other Topics Concern  . Not on file  Social History Narrative  . Not on file   Family History  Problem Relation Age of Onset  . Heart failure Father   . Heart attack Mother    No Known Allergies Prior to Admission medications   Medication Sig Start Date End Date Taking? Authorizing Provider  acetaminophen (TYLENOL) 650 MG CR tablet Take 1,300 mg by mouth daily.   Yes [provider]  amLODipine (NORVASC) 10 MG tablet TAKE 1 TABLET (10 MG TOTAL) BY MOUTH DAILY. Patient taking differently: Take 10 mg by mouth daily with lunch.  11/17/17  Yes Burnard Hawthorne, FNP  aspirin EC 81 MG tablet Take 81 mg by mouth daily.     Yes [provider]  azelastine (ASTELIN) 0.1 % nasal spray USE 1 SPRAY IN EACH NOSTRIL TWICE DAILY AS DIRECTED Patient taking differently: Place 1 spray into both nostrils 2 (two) times daily as needed for rhinitis or allergies.  04/17/18  Yes Burnard Hawthorne, FNP  Cholecalciferol (VITAMIN D3) 2000 UNITS TABS Take 2,000 Units by mouth daily.    Yes [provider]  Cyanocobalamin (B-12) 5000 MCG SUBL Place 5,000 mcg under the tongue daily.    Yes [provider]  DULoxetine (CYMBALTA) 30 MG capsule TAKE 1 CAPSULE BY MOUTH EVERY DAY 04/12/18  Yes Burnard Hawthorne, FNP  fluconazole (DIFLUCAN) 200 MG tablet Take 200 mg by mouth every Monday.  12/15/17  Yes [provider]  hydrochlorothiazide (MICROZIDE) 12.5 MG capsule TAKE 1 CAPSULE BY MOUTH EVERY DAY 07/09/18   Yes Arnett, Yvetta Coder, FNP  loratadine (CLARITIN) 10 MG tablet Take 10 mg by mouth daily.   Yes [provider]  losartan (COZAAR) 100 MG tablet TAKE 1 TABLET BY MOUTH EVERY DAY 05/21/18  Yes Arnett, Yvetta Coder, FNP  metFORMIN (GLUCOPHAGE) 500 MG tablet TAKE 1 TABLET (500 MG TOTAL) BY MOUTH 2 (TWO) TIMES DAILY WITH A MEAL. 04/20/18  Yes Burnard Hawthorne, FNP  metoprolol succinate (TOPROL-XL) 25 MG 24 hr tablet TAKE 1 TABLET BY MOUTH EVERY DAY 07/26/18  Yes Arnett, Yvetta Coder, FNP  Naphazoline-Glycerin (REDNESS RELIEF OP) Place 1 drop into both eyes daily as needed (redness).   Yes [provider]  neomycin-bacitracin-polymyxin (NEOSPORIN) ointment Apply 1 application topically as needed for wound care.   Yes [provider]  pravastatin (PRAVACHOL) 40 MG tablet TAKE 1 TABLET BY MOUTH EVERY DAY 02/26/18  Yes Arnett, Yvetta Coder, FNP  ranitidine (ZANTAC) 75 MG tablet Take 75 mg by mouth daily.    Yes [provider]  tamsulosin (FLOMAX) 0.4 MG CAPS capsule TAKE 1 CAPSULE BY MOUTH EVERY DAY Patient taking differently: Take 0.4 mg by mouth daily with lunch.  06/19/18  Yes Arnett, Yvetta Coder, FNP  traMADol (ULTRAM) 50 MG tablet Take 50 mg by mouth 2 (two) times daily as needed for pain. 07/18/18  Yes [provider]  traZODone (DESYREL) 50 MG tablet TAKE 1 TABLET (50 MG TOTAL) BY MOUTH AT BEDTIME AS NEEDED FOR SLEEP. 07/09/18  Yes Arnett, Yvetta Coder, FNP  ACCU-CHEK SOFTCLIX LANCETS lancets Use up to 4 times daily to check blood sugar. Diagnosis E11.9 10/22/16   Coral Spikes, DO  camphor-menthol [facility]) lotion Apply 1 application topically as needed for itching.    [provider]  [facility] VERIO test strip TEST 3 TIMES A DAY 02/09/18   Burnard Hawthorne, FNP     Positive ROS: All other systems have been reviewed and were otherwise negative with the exception of those mentioned in the HPI and as above.  Physical Exam: General: Alert, no acute  distress   MUSCULOSKELETAL: Right knee:  Open wound approximately 1.5 cm in diameter.  Patient has mild reactive erythema around the wound.   No active drainage is seen.  There is no area of fluctuance.    Assessment: OSTEOARTHRITIS OF RIGHT KNEE  Plan: Plan for Procedure(s):  CANCELED RIGHT TOTAL KNEE ARTHROPLASTY.  I explained to the patient and his wife that I do not recommend proceeding with total knee arthroplasty in the setting of an open wound  which would be at the inferior pole of his incision.  This places him at high risk for postoperative infection.  I am canceling the case today.  Antibiotics will be called in for him to take at home for prophylaxis against infection.  Scheduled for surgery in a few weeks after his wound has healed.  Patient and his wife understood and agreed with this plan.    Thornton Park, MD   09/18/2018 2:37 PM

## 2018-09-18 NOTE — Telephone Encounter (Signed)
Called patient appointment flu shot injection

## 2018-09-20 ENCOUNTER — Ambulatory Visit (INDEPENDENT_AMBULATORY_CARE_PROVIDER_SITE_OTHER): Payer: PPO | Admitting: *Deleted

## 2018-09-20 DIAGNOSIS — Z23 Encounter for immunization: Secondary | ICD-10-CM

## 2018-09-24 ENCOUNTER — Other Ambulatory Visit: Payer: Self-pay | Admitting: Family

## 2018-09-26 MED ORDER — CEFAZOLIN SODIUM-DEXTROSE 2-4 GM/100ML-% IV SOLN
2.0000 g | INTRAVENOUS | Status: AC
  Administered 2018-09-27: 2 g via INTRAVENOUS

## 2018-09-27 ENCOUNTER — Inpatient Hospital Stay
Admission: RE | Admit: 2018-09-27 | Discharge: 2018-10-01 | DRG: 470 | Disposition: A | Discharge status: Home Health | Payer: PPO | Attending: Orthopedic Surgery | Admitting: Orthopedic Surgery

## 2018-09-27 ENCOUNTER — Inpatient Hospital Stay: Payer: PPO | Admitting: Anesthesiology

## 2018-09-27 ENCOUNTER — Encounter: Payer: Self-pay | Admitting: *Deleted

## 2018-09-27 ENCOUNTER — Other Ambulatory Visit: Payer: Self-pay

## 2018-09-27 ENCOUNTER — Inpatient Hospital Stay: Payer: PPO

## 2018-09-27 ENCOUNTER — Encounter
Admission: RE | Disposition: A | Discharge status: Home Health | Payer: Self-pay | Source: Home / Self Care | Attending: Orthopedic Surgery

## 2018-09-27 DIAGNOSIS — G4730 Sleep apnea, unspecified: Secondary | ICD-10-CM | POA: Diagnosis not present

## 2018-09-27 DIAGNOSIS — Z96651 Presence of right artificial knee joint: Secondary | ICD-10-CM

## 2018-09-27 DIAGNOSIS — M1711 Unilateral primary osteoarthritis, right knee: Principal | ICD-10-CM | POA: Diagnosis present

## 2018-09-27 DIAGNOSIS — F329 Major depressive disorder, single episode, unspecified: Secondary | ICD-10-CM | POA: Diagnosis present

## 2018-09-27 DIAGNOSIS — Z7984 Long term (current) use of oral hypoglycemic drugs: Secondary | ICD-10-CM

## 2018-09-27 DIAGNOSIS — Z7982 Long term (current) use of aspirin: Secondary | ICD-10-CM

## 2018-09-27 DIAGNOSIS — M25561 Pain in right knee: Secondary | ICD-10-CM | POA: Diagnosis present

## 2018-09-27 DIAGNOSIS — E119 Type 2 diabetes mellitus without complications: Secondary | ICD-10-CM | POA: Diagnosis not present

## 2018-09-27 DIAGNOSIS — I10 Essential (primary) hypertension: Secondary | ICD-10-CM | POA: Diagnosis not present

## 2018-09-27 DIAGNOSIS — J45909 Unspecified asthma, uncomplicated: Secondary | ICD-10-CM | POA: Diagnosis present

## 2018-09-27 DIAGNOSIS — Z8249 Family history of ischemic heart disease and other diseases of the circulatory system: Secondary | ICD-10-CM | POA: Diagnosis not present

## 2018-09-27 DIAGNOSIS — Z79899 Other long term (current) drug therapy: Secondary | ICD-10-CM

## 2018-09-27 DIAGNOSIS — M25562 Pain in left knee: Secondary | ICD-10-CM | POA: Diagnosis not present

## 2018-09-27 DIAGNOSIS — Z471 Aftercare following joint replacement surgery: Secondary | ICD-10-CM | POA: Diagnosis not present

## 2018-09-27 DIAGNOSIS — I2510 Atherosclerotic heart disease of native coronary artery without angina pectoris: Secondary | ICD-10-CM | POA: Diagnosis not present

## 2018-09-27 DIAGNOSIS — G4733 Obstructive sleep apnea (adult) (pediatric): Secondary | ICD-10-CM | POA: Diagnosis not present

## 2018-09-27 HISTORY — PX: TOTAL KNEE ARTHROPLASTY: SHX125

## 2018-09-27 LAB — TYPE AND SCREEN
ABO/RH(D): B POS
Antibody Screen: NEGATIVE

## 2018-09-27 LAB — GLUCOSE, CAPILLARY
Glucose-Capillary: 134 mg/dL — ABNORMAL HIGH (ref 70–99)
Glucose-Capillary: 151 mg/dL — ABNORMAL HIGH (ref 70–99)

## 2018-09-27 SURGERY — ARTHROPLASTY, KNEE, TOTAL
Anesthesia: Spinal | Site: Knee | Laterality: Right

## 2018-09-27 MED ORDER — LACTATED RINGERS IV SOLN
INTRAVENOUS | Status: DC | PRN
  Administered 2018-09-27: 08:00:00 via INTRAVENOUS

## 2018-09-27 MED ORDER — PRAVASTATIN SODIUM 40 MG PO TABS
40.0000 mg | ORAL_TABLET | Freq: Every day | ORAL | Status: DC
  Administered 2018-09-28 – 2018-10-01 (×4): 40 mg via ORAL
  Filled 2018-09-27: qty 2
  Filled 2018-09-27 (×2): qty 1
  Filled 2018-09-27: qty 2
  Filled 2018-09-27: qty 1
  Filled 2018-09-27: qty 2
  Filled 2018-09-27: qty 1
  Filled 2018-09-27: qty 2

## 2018-09-27 MED ORDER — CHLORHEXIDINE GLUCONATE CLOTH 2 % EX PADS: 6.0000 | MEDICATED_PAD | Freq: Once | CUTANEOUS | Status: DC

## 2018-09-27 MED ORDER — CLINDAMYCIN PHOSPHATE 600 MG/50ML IV SOLN
600.0000 mg | Freq: Three times a day (TID) | INTRAVENOUS | Status: AC
  Administered 2018-09-27 – 2018-09-28 (×2): 600 mg via INTRAVENOUS
  Filled 2018-09-27 (×2): qty 50

## 2018-09-27 MED ORDER — TRAMADOL HCL 50 MG PO TABS
50.0000 mg | ORAL_TABLET | Freq: Four times a day (QID) | ORAL | Status: DC
  Administered 2018-09-27 – 2018-10-01 (×12): 50 mg via ORAL
  Filled 2018-09-27 (×13): qty 1

## 2018-09-27 MED ORDER — ONDANSETRON HCL 4 MG PO TABS: 4.0000 mg | ORAL_TABLET | Freq: Four times a day (QID) | ORAL | Status: DC | PRN

## 2018-09-27 MED ORDER — SODIUM CHLORIDE 0.9 % IV SOLN
INTRAVENOUS | Status: DC
  Administered 2018-09-27: 75 mL/h via INTRAVENOUS

## 2018-09-27 MED ORDER — BUPIVACAINE-EPINEPHRINE (PF) 0.25% -1:200000 IJ SOLN
INTRAMUSCULAR | Status: AC
  Filled 2018-09-27: qty 60

## 2018-09-27 MED ORDER — BUPIVACAINE HCL (PF) 0.5 % IJ SOLN
INTRAMUSCULAR | Status: DC | PRN
  Administered 2018-09-27: 15 mL

## 2018-09-27 MED ORDER — METHOCARBAMOL 500 MG PO TABS
500.0000 mg | ORAL_TABLET | Freq: Four times a day (QID) | ORAL | Status: DC | PRN
  Administered 2018-09-28 – 2018-09-30 (×3): 500 mg via ORAL
  Filled 2018-09-27 (×3): qty 1

## 2018-09-27 MED ORDER — NEOMYCIN-POLYMYXIN B GU 40-200000 IR SOLN
Status: AC
  Filled 2018-09-27: qty 20

## 2018-09-27 MED ORDER — CEFAZOLIN SODIUM-DEXTROSE 1-4 GM/50ML-% IV SOLN
1.0000 g | Freq: Four times a day (QID) | INTRAVENOUS | Status: AC
  Administered 2018-09-27 (×2): 1 g via INTRAVENOUS
  Filled 2018-09-27 (×2): qty 50

## 2018-09-27 MED ORDER — EPHEDRINE SULFATE 50 MG/ML IJ SOLN
INTRAMUSCULAR | Status: AC
  Filled 2018-09-27: qty 1

## 2018-09-27 MED ORDER — ENOXAPARIN SODIUM 40 MG/0.4ML ~~LOC~~ SOLN
40.0000 mg | SUBCUTANEOUS | Status: DC
  Administered 2018-09-28 – 2018-10-01 (×4): 40 mg via SUBCUTANEOUS
  Filled 2018-09-27 (×4): qty 0.4

## 2018-09-27 MED ORDER — SODIUM CHLORIDE 0.9 % IV SOLN
INTRAVENOUS | Status: DC | PRN
  Administered 2018-09-27: 60 mL

## 2018-09-27 MED ORDER — ALUM & MAG HYDROXIDE-SIMETH 200-200-20 MG/5ML PO SUSP: 30.0000 mL | ORAL | Status: DC | PRN

## 2018-09-27 MED ORDER — MORPHINE SULFATE (PF) 4 MG/ML IV SOLN
INTRAVENOUS | Status: AC
  Filled 2018-09-27: qty 1

## 2018-09-27 MED ORDER — AZELASTINE HCL 0.1 % NA SOLN: 1.0000 | Freq: Two times a day (BID) | NASAL | Status: DC | PRN

## 2018-09-27 MED ORDER — OXYCODONE HCL 5 MG PO TABS: 5.0000 mg | ORAL_TABLET | Freq: Once | ORAL | Status: DC | PRN

## 2018-09-27 MED ORDER — DOCUSATE SODIUM 100 MG PO CAPS
100.0000 mg | ORAL_CAPSULE | Freq: Two times a day (BID) | ORAL | Status: DC
  Administered 2018-09-27 – 2018-10-01 (×8): 100 mg via ORAL
  Filled 2018-09-27 (×9): qty 1

## 2018-09-27 MED ORDER — PROPOFOL 500 MG/50ML IV EMUL
INTRAVENOUS | Status: AC
  Filled 2018-09-27: qty 50

## 2018-09-27 MED ORDER — PHENYLEPHRINE HCL 10 MG/ML IJ SOLN
INTRAMUSCULAR | Status: DC | PRN
  Administered 2018-09-27 (×2): 100 ug via INTRAVENOUS
  Administered 2018-09-27: 200 ug via INTRAVENOUS
  Administered 2018-09-27: 100 ug via INTRAVENOUS

## 2018-09-27 MED ORDER — TRANEXAMIC ACID-NACL 1000-0.7 MG/100ML-% IV SOLN
INTRAVENOUS | Status: DC | PRN
  Administered 2018-09-27: 1000 mg via INTRAVENOUS

## 2018-09-27 MED ORDER — BUPIVACAINE-EPINEPHRINE (PF) 0.25% -1:200000 IJ SOLN
INTRAMUSCULAR | Status: DC | PRN
  Administered 2018-09-27: 60 mL

## 2018-09-27 MED ORDER — METOCLOPRAMIDE HCL 10 MG PO TABS: 5.0000 mg | ORAL_TABLET | Freq: Three times a day (TID) | ORAL | Status: DC | PRN

## 2018-09-27 MED ORDER — MORPHINE SULFATE 4 MG/ML IJ SOLN
INTRAMUSCULAR | Status: DC | PRN
  Administered 2018-09-27: 4 mg

## 2018-09-27 MED ORDER — MIDAZOLAM HCL 2 MG/2ML IJ SOLN
INTRAMUSCULAR | Status: AC
  Filled 2018-09-27: qty 2

## 2018-09-27 MED ORDER — METHOCARBAMOL 1000 MG/10ML IJ SOLN
500.0000 mg | Freq: Four times a day (QID) | INTRAVENOUS | Status: DC | PRN
  Filled 2018-09-27: qty 5

## 2018-09-27 MED ORDER — PROPOFOL 500 MG/50ML IV EMUL
INTRAVENOUS | Status: DC | PRN
  Administered 2018-09-27: 75 ug/kg/min via INTRAVENOUS

## 2018-09-27 MED ORDER — DULOXETINE HCL 30 MG PO CPEP
30.0000 mg | ORAL_CAPSULE | Freq: Every day | ORAL | Status: DC
  Administered 2018-09-28 – 2018-10-01 (×4): 30 mg via ORAL
  Filled 2018-09-27 (×4): qty 1

## 2018-09-27 MED ORDER — FENTANYL CITRATE (PF) 100 MCG/2ML IJ SOLN: 25.0000 ug | INTRAMUSCULAR | Status: DC | PRN

## 2018-09-27 MED ORDER — HYDROMORPHONE HCL 1 MG/ML IJ SOLN
0.5000 mg | INTRAMUSCULAR | Status: DC | PRN
  Administered 2018-09-28: 1 mg via INTRAVENOUS
  Filled 2018-09-27: qty 1

## 2018-09-27 MED ORDER — OXYCODONE HCL 5 MG/5ML PO SOLN: 5.0000 mg | Freq: Once | ORAL | Status: DC | PRN

## 2018-09-27 MED ORDER — GABAPENTIN 300 MG PO CAPS
300.0000 mg | ORAL_CAPSULE | Freq: Three times a day (TID) | ORAL | Status: DC
  Administered 2018-09-27 – 2018-10-01 (×12): 300 mg via ORAL
  Filled 2018-09-27 (×12): qty 1

## 2018-09-27 MED ORDER — ACETAMINOPHEN 10 MG/ML IV SOLN
INTRAVENOUS | Status: AC
  Filled 2018-09-27: qty 100

## 2018-09-27 MED ORDER — FLUCONAZOLE 100 MG PO TABS
200.0000 mg | ORAL_TABLET | ORAL | Status: DC
  Administered 2018-10-01: 200 mg via ORAL
  Filled 2018-09-27: qty 2

## 2018-09-27 MED ORDER — SODIUM CHLORIDE 0.9 % IJ SOLN
INTRAMUSCULAR | Status: AC
  Filled 2018-09-27: qty 50

## 2018-09-27 MED ORDER — OXYCODONE HCL 5 MG PO TABS
5.0000 mg | ORAL_TABLET | ORAL | Status: DC | PRN
  Administered 2018-09-27: 10 mg via ORAL
  Administered 2018-09-28: 5 mg via ORAL
  Administered 2018-09-28: 10 mg via ORAL
  Administered 2018-09-28: 5 mg via ORAL
  Administered 2018-09-29 – 2018-10-01 (×5): 10 mg via ORAL
  Filled 2018-09-27 (×10): qty 2
  Filled 2018-09-27: qty 1

## 2018-09-27 MED ORDER — ACETAMINOPHEN 500 MG PO TABS
1000.0000 mg | ORAL_TABLET | Freq: Four times a day (QID) | ORAL | Status: AC
  Administered 2018-09-27 – 2018-09-28 (×3): 1000 mg via ORAL
  Filled 2018-09-27 (×3): qty 2

## 2018-09-27 MED ORDER — ACETAMINOPHEN 10 MG/ML IV SOLN
INTRAVENOUS | Status: DC | PRN
  Administered 2018-09-27: 1000 mg via INTRAVENOUS

## 2018-09-27 MED ORDER — ASPIRIN EC 81 MG PO TBEC
81.0000 mg | DELAYED_RELEASE_TABLET | Freq: Every day | ORAL | Status: DC
  Administered 2018-09-27 – 2018-10-01 (×5): 81 mg via ORAL
  Filled 2018-09-27 (×5): qty 1

## 2018-09-27 MED ORDER — CEFAZOLIN SODIUM-DEXTROSE 2-4 GM/100ML-% IV SOLN
INTRAVENOUS | Status: AC
  Filled 2018-09-27: qty 100

## 2018-09-27 MED ORDER — PHENYLEPHRINE HCL 10 MG/ML IJ SOLN
INTRAMUSCULAR | Status: AC
  Filled 2018-09-27: qty 1

## 2018-09-27 MED ORDER — AMLODIPINE BESYLATE 10 MG PO TABS
10.0000 mg | ORAL_TABLET | Freq: Every day | ORAL | Status: DC
  Administered 2018-09-28 – 2018-10-01 (×4): 10 mg via ORAL
  Filled 2018-09-27 (×4): qty 1

## 2018-09-27 MED ORDER — METOPROLOL SUCCINATE ER 25 MG PO TB24
25.0000 mg | ORAL_TABLET | Freq: Every day | ORAL | Status: DC
  Administered 2018-09-28 – 2018-10-01 (×4): 25 mg via ORAL
  Filled 2018-09-27 (×4): qty 1

## 2018-09-27 MED ORDER — LIDOCAINE HCL (PF) 2 % IJ SOLN
INTRAMUSCULAR | Status: AC
  Filled 2018-09-27: qty 10

## 2018-09-27 MED ORDER — SENNOSIDES-DOCUSATE SODIUM 8.6-50 MG PO TABS
1.0000 | ORAL_TABLET | Freq: Every evening | ORAL | Status: DC | PRN
  Administered 2018-09-29: 1 via ORAL
  Filled 2018-09-27: qty 1

## 2018-09-27 MED ORDER — MAGNESIUM CITRATE PO SOLN: 1.0000 | Freq: Once | ORAL | Status: DC | PRN

## 2018-09-27 MED ORDER — BUPIVACAINE LIPOSOME 1.3 % IJ SUSP
INTRAMUSCULAR | Status: AC
  Filled 2018-09-27: qty 20

## 2018-09-27 MED ORDER — ACETAMINOPHEN 325 MG PO TABS: 325.0000 mg | ORAL_TABLET | Freq: Four times a day (QID) | ORAL | Status: DC | PRN

## 2018-09-27 MED ORDER — ALBUMIN HUMAN 5 % IV SOLN
INTRAVENOUS | Status: DC | PRN
  Administered 2018-09-27: 09:00:00 via INTRAVENOUS

## 2018-09-27 MED ORDER — CHLORHEXIDINE GLUCONATE CLOTH 2 % EX PADS
6.0000 | MEDICATED_PAD | Freq: Once | CUTANEOUS | Status: AC
  Administered 2018-09-27: 6 via TOPICAL

## 2018-09-27 MED ORDER — EPHEDRINE SULFATE 50 MG/ML IJ SOLN
INTRAMUSCULAR | Status: DC | PRN
  Administered 2018-09-27: 10 mg via INTRAVENOUS

## 2018-09-27 MED ORDER — SODIUM CHLORIDE 0.9 % IV SOLN
INTRAVENOUS | Status: DC | PRN
  Administered 2018-09-27: 50 ug/min via INTRAVENOUS

## 2018-09-27 MED ORDER — VITAMIN B-12 1000 MCG PO TABS
5000.0000 ug | ORAL_TABLET | Freq: Every day | ORAL | Status: DC
  Administered 2018-09-27 – 2018-10-01 (×5): 5000 ug via ORAL
  Filled 2018-09-27 (×5): qty 5

## 2018-09-27 MED ORDER — LOSARTAN POTASSIUM 50 MG PO TABS
100.0000 mg | ORAL_TABLET | Freq: Every day | ORAL | Status: DC
  Administered 2018-09-28 – 2018-10-01 (×4): 100 mg via ORAL
  Filled 2018-09-27 (×5): qty 2

## 2018-09-27 MED ORDER — CLINDAMYCIN PHOSPHATE 600 MG/50ML IV SOLN
INTRAVENOUS | Status: AC
  Filled 2018-09-27: qty 50

## 2018-09-27 MED ORDER — ONDANSETRON HCL 4 MG/2ML IJ SOLN: 4.0000 mg | Freq: Four times a day (QID) | INTRAMUSCULAR | Status: DC | PRN

## 2018-09-27 MED ORDER — VITAMIN D 1000 UNITS PO TABS
2000.0000 [IU] | ORAL_TABLET | Freq: Every day | ORAL | Status: DC
  Administered 2018-09-27 – 2018-10-01 (×5): 2000 [IU] via ORAL
  Filled 2018-09-27 (×6): qty 2

## 2018-09-27 MED ORDER — LORATADINE 10 MG PO TABS
10.0000 mg | ORAL_TABLET | Freq: Every day | ORAL | Status: DC
  Administered 2018-09-28 – 2018-10-01 (×4): 10 mg via ORAL
  Filled 2018-09-27 (×4): qty 1

## 2018-09-27 MED ORDER — KETOROLAC TROMETHAMINE 30 MG/ML IJ SOLN
INTRAMUSCULAR | Status: DC | PRN
  Administered 2018-09-27: 30 mg

## 2018-09-27 MED ORDER — DIPHENHYDRAMINE HCL 12.5 MG/5ML PO ELIX: 12.5000 mg | ORAL_SOLUTION | ORAL | Status: DC | PRN

## 2018-09-27 MED ORDER — TRAZODONE HCL 50 MG PO TABS: 50.0000 mg | ORAL_TABLET | Freq: Every evening | ORAL | Status: DC | PRN

## 2018-09-27 MED ORDER — HYDROCHLOROTHIAZIDE 12.5 MG PO CAPS
12.5000 mg | ORAL_CAPSULE | Freq: Every day | ORAL | Status: DC
  Administered 2018-10-01: 12.5 mg via ORAL
  Filled 2018-09-27 (×3): qty 1

## 2018-09-27 MED ORDER — SODIUM CHLORIDE 0.9 % IV SOLN
INTRAVENOUS | Status: DC
  Administered 2018-09-27: 15:00:00 via INTRAVENOUS

## 2018-09-27 MED ORDER — TAMSULOSIN HCL 0.4 MG PO CAPS
0.4000 mg | ORAL_CAPSULE | Freq: Every day | ORAL | Status: DC
  Administered 2018-09-28 – 2018-10-01 (×4): 0.4 mg via ORAL
  Filled 2018-09-27 (×4): qty 1

## 2018-09-27 MED ORDER — BISACODYL 5 MG PO TBEC
10.0000 mg | DELAYED_RELEASE_TABLET | Freq: Every day | ORAL | Status: DC | PRN
  Administered 2018-09-28 – 2018-09-29 (×2): 10 mg via ORAL
  Filled 2018-09-27 (×2): qty 2

## 2018-09-27 MED ORDER — METOCLOPRAMIDE HCL 5 MG/ML IJ SOLN: 5.0000 mg | Freq: Three times a day (TID) | INTRAMUSCULAR | Status: DC | PRN

## 2018-09-27 MED ORDER — FAMOTIDINE 20 MG PO TABS
20.0000 mg | ORAL_TABLET | Freq: Every day | ORAL | Status: DC
  Administered 2018-09-28 – 2018-10-01 (×4): 20 mg via ORAL
  Filled 2018-09-27 (×4): qty 1

## 2018-09-27 MED ORDER — OXYCODONE HCL 5 MG PO TABS
10.0000 mg | ORAL_TABLET | ORAL | Status: DC | PRN
  Administered 2018-09-27 – 2018-09-29 (×3): 10 mg via ORAL
  Filled 2018-09-27: qty 2

## 2018-09-27 MED ORDER — CLINDAMYCIN PHOSPHATE 600 MG/50ML IV SOLN
600.0000 mg | Freq: Once | INTRAVENOUS | Status: AC
  Administered 2018-09-27: 600 mg via INTRAVENOUS

## 2018-09-27 MED ORDER — NEOMYCIN-POLYMYXIN B GU 40-200000 IR SOLN
Status: DC | PRN
  Administered 2018-09-27: 16 mL

## 2018-09-27 MED ORDER — METFORMIN HCL 500 MG PO TABS
500.0000 mg | ORAL_TABLET | Freq: Two times a day (BID) | ORAL | Status: DC
  Administered 2018-09-27 – 2018-10-01 (×8): 500 mg via ORAL
  Filled 2018-09-27 (×9): qty 1

## 2018-09-27 SURGICAL SUPPLY — 66 items
BLADE SAW 90X13X1.19 OSCILLAT (BLADE) ×3 IMPLANT
BLADE SAW 90X25X1.19 OSCILLAT (BLADE) ×3 IMPLANT
CANISTER SUCT 1200ML W/VALVE (MISCELLANEOUS) ×3 IMPLANT
CANISTER SUCT 3000ML PPV (MISCELLANEOUS) ×6 IMPLANT
CEMENT HV SMART SET (Cement) ×6 IMPLANT
CEMENT TIBIA MBT SIZE 4 (Knees) ×1 IMPLANT
CNTNR SPEC 2.5X3XGRAD LEK (MISCELLANEOUS) ×1
CONT SPEC 4OZ STER OR WHT (MISCELLANEOUS) ×2
CONTAINER SPEC 2.5X3XGRAD LEK (MISCELLANEOUS) ×1 IMPLANT
COOLER POLAR GLACIER W/PUMP (MISCELLANEOUS) ×3 IMPLANT
COVER WAND RF STERILE (DRAPES) ×3 IMPLANT
CUFF TOURN 24 STER (MISCELLANEOUS) IMPLANT
CUFF TOURN 30 STER DUAL PORT (MISCELLANEOUS) ×3 IMPLANT
DRAPE IMP U-DRAPE 54X76 (DRAPES) ×3 IMPLANT
DRAPE INCISE IOBAN 66X60 STRL (DRAPES) ×3 IMPLANT
DRAPE SHEET LG 3/4 BI-LAMINATE (DRAPES) ×6 IMPLANT
DRAPE SURG 17X11 SM STRL (DRAPES) ×6 IMPLANT
DRSG OPSITE POSTOP 4X12 (GAUZE/BANDAGES/DRESSINGS) ×3 IMPLANT
DRSG OPSITE POSTOP 4X14 (GAUZE/BANDAGES/DRESSINGS) ×3 IMPLANT
DURAPREP 26ML APPLICATOR (WOUND CARE) ×9 IMPLANT
ELECT BLADE 6.5 EXT (BLADE) ×3 IMPLANT
ELECT REM PT RETURN 9FT ADLT (ELECTROSURGICAL) ×3
ELECTRODE REM PT RTRN 9FT ADLT (ELECTROSURGICAL) ×1 IMPLANT
FEMUR SIGMA PS SZ 5.0 R (Femur) ×3 IMPLANT
GAUZE SPONGE 4X4 12PLY STRL (GAUZE/BANDAGES/DRESSINGS) ×3 IMPLANT
GLOVE BIOGEL PI IND STRL 9 (GLOVE) ×1 IMPLANT
GLOVE BIOGEL PI INDICATOR 9 (GLOVE) ×2
GLOVE SURG 9.0 ORTHO LTXF (GLOVE) ×9 IMPLANT
GOWN STRL REUS TWL 2XL XL LVL4 (GOWN DISPOSABLE) ×3 IMPLANT
GOWN STRL REUS W/ TWL LRG LVL3 (GOWN DISPOSABLE) ×1 IMPLANT
GOWN STRL REUS W/ TWL LRG LVL4 (GOWN DISPOSABLE) ×3 IMPLANT
GOWN STRL REUS W/TWL LRG LVL3 (GOWN DISPOSABLE) ×2
GOWN STRL REUS W/TWL LRG LVL4 (GOWN DISPOSABLE) ×6
HOLDER FOLEY CATH W/STRAP (MISCELLANEOUS) ×3 IMPLANT
IMMBOLIZER KNEE 19 BLUE UNIV (SOFTGOODS) ×3 IMPLANT
KIT TURNOVER KIT A (KITS) ×3 IMPLANT
NDL SAFETY ECLIPSE 18X1.5 (NEEDLE) ×1 IMPLANT
NEEDLE HYPO 18GX1.5 SHARP (NEEDLE) ×2
NEEDLE HYPO 22GX1.5 SAFETY (NEEDLE) ×3 IMPLANT
NEEDLE SPNL 20GX3.5 QUINCKE YW (NEEDLE) ×3 IMPLANT
NS IRRIG 1000ML POUR BTL (IV SOLUTION) ×3 IMPLANT
PACK TOTAL KNEE (MISCELLANEOUS) ×3 IMPLANT
PAD PREP 24X41 OB/GYN DISP (PERSONAL CARE ITEMS) ×3 IMPLANT
PAD WRAPON POLAR KNEE (MISCELLANEOUS) ×1 IMPLANT
PATELLA DOME PFC 38MM (Knees) ×3 IMPLANT
PLATE ROT INSERT 10MM SIZE 5 (Plate) ×3 IMPLANT
PULSAVAC PLUS IRRIG FAN TIP (DISPOSABLE) ×3
SOL .9 NS 3000ML IRR  AL (IV SOLUTION) ×2
SOL .9 NS 3000ML IRR UROMATIC (IV SOLUTION) ×1 IMPLANT
SPONGE DRAIN TRACH 4X4 STRL 2S (GAUZE/BANDAGES/DRESSINGS) ×3 IMPLANT
SPONGE LAP 18X18 RF (DISPOSABLE) IMPLANT
STAPLER SKIN PROX 35W (STAPLE) ×3 IMPLANT
SUCTION FRAZIER HANDLE 10FR (MISCELLANEOUS) ×2
SUCTION TUBE FRAZIER 10FR DISP (MISCELLANEOUS) ×1 IMPLANT
SUT ETHIBOND NAB CT1 #1 30IN (SUTURE) ×9 IMPLANT
SUT VIC AB 0 CT1 36 (SUTURE) ×3 IMPLANT
SUT VIC AB 2-0 CT1 (SUTURE) ×6 IMPLANT
SYR 20CC LL (SYRINGE) ×3 IMPLANT
SYR 30ML LL (SYRINGE) ×6 IMPLANT
SYR 3ML 18GX1 1/2 (SYRINGE) ×6 IMPLANT
TIBIA MBT CEMENT SIZE 4 (Knees) ×3 IMPLANT
TIP FAN IRRIG PULSAVAC PLUS (DISPOSABLE) ×1 IMPLANT
TOWER CARTRIDGE SMART MIX (DISPOSABLE) ×3 IMPLANT
TRAY FOLEY MTR SLVR 16FR STAT (SET/KITS/TRAYS/PACK) ×3 IMPLANT
TUBE SUCT KAM VAC (TUBING) ×3 IMPLANT
WRAPON POLAR PAD KNEE (MISCELLANEOUS) ×3

## 2018-09-27 NOTE — NC FL2 (Signed)
[place] LEVEL OF CARE SCREENING TOOL     IDENTIFICATION  Patient Name: Gid Schoffstall. Birthdate: [birth year] Sex: male Admission Date (Current Location): 09/27/2018  [place] and [place] Number:  Engineering geologist and Address:  [facility], [address], [place], [place] [address]      Provider Number: [id number]  Attending Physician Name and Address:  Thornton Park, MD  Relative Name and Phone Number:       Current Level of Care: Hospital Recommended Level of Care: [place] Prior Approval Number:    Date Approved/Denied:   PASRR Number: ([id number])  Discharge Plan: SNF    Current Diagnoses: Patient Active Problem List   Diagnosis Date Noted  . S/P TKR (total knee replacement) using cement, right 09/27/2018  . OSA (obstructive sleep apnea) 04/16/2018  . Tremor 01/17/2018  . Acute left-sided low back pain without sciatica 11/10/2017  . Sinusitis 04/21/2017  . BPH (benign prostatic hyperplasia) 01/10/2017  . Chronic back pain 08/19/2016  . Chronic pain of both knees 09/14/2015  . Erectile dysfunction 09/10/2012  . Right bundle branch block 09/10/2012  . Hypertension 11/10/2011  . Insomnia 11/10/2011  . Diabetes mellitus type 2, controlled ([place]) 11/10/2011  . Depression, recurrent ([place]) 05/13/2009    Orientation RESPIRATION BLADDER Height & Weight     Self, Time, Situation, Place  Normal Continent Weight: 217 lb (98.4 kg) Height:  5\' 11"  (180.3 cm)  BEHAVIORAL SYMPTOMS/MOOD NEUROLOGICAL BOWEL NUTRITION STATUS      Continent Diet(Diet: Clear Liquid to be Advanced. )  AMBULATORY STATUS COMMUNICATION OF NEEDS Skin   Extensive Assist Verbally Surgical wounds(Incision: Right Knee. )                       Personal Care Assistance Level of Assistance  Bathing, Feeding, Dressing Bathing Assistance: Limited assistance Feeding assistance: Independent Dressing Assistance: Limited assistance      Functional Limitations Info  Sight, Hearing, Speech Sight Info: Adequate Hearing Info: Adequate Speech Info: Adequate    SPECIAL CARE FACTORS FREQUENCY  PT (By licensed PT), OT (By licensed OT)     PT Frequency: (5) OT Frequency: (5)            Contractures      Additional Factors Info  Code Status, Allergies Code Status Info: (Full Code. ) Allergies Info: (Ibuprofen)           Current Medications (09/27/2018):  This is the current hospital active medication list Current Facility-Administered Medications  Medication Dose Route Frequency Provider Last Rate Last Dose  . 0.9 %  sodium chloride infusion   Intravenous Continuous Thornton Park, MD 75 mL/hr at 09/27/18 1443    . acetaminophen (TYLENOL) tablet 1,000 mg  1,000 mg Oral Q6H Thornton Park, MD      . Derrill Memo ON 09/28/2018] acetaminophen (TYLENOL) tablet 325-650 mg  325-650 mg Oral Q6H PRN Thornton Park, MD      . alum & mag hydroxide-simeth (MAALOX/MYLANTA) 200-200-20 MG/5ML suspension 30 mL  30 mL Oral Q4H PRN Thornton Park, MD      . Derrill Memo ON 09/28/2018] amLODipine (NORVASC) tablet 10 mg  10 mg Oral Q lunch Thornton Park, MD      . aspirin EC tablet 81 mg  81 mg Oral Daily Thornton Park, MD      . azelastine (ASTELIN) 0.1 % nasal spray 1 spray  1 spray Each Nare BID PRN Thornton Park, MD      .  bisacodyl (DULCOLAX) EC tablet 10 mg  10 mg Oral Daily PRN Thornton Park, MD      . ceFAZolin (ANCEF) IVPB 1 g/50 mL premix  1 g Intravenous Q6H Thornton Park, MD 100 mL/hr at 09/27/18 1445 1 g at 09/27/18 1445  . cholecalciferol (VITAMIN D) tablet 2,000 Units  2,000 Units Oral Daily Thornton Park, MD   2,000 Units at 09/27/18 1442  . clindamycin (CLEOCIN) IVPB 600 mg  600 mg Intravenous Q8H Thornton Park, MD      . diphenhydrAMINE (BENADRYL) 12.5 MG/5ML elixir 12.5-25 mg  12.5-25 mg Oral Q4H PRN Thornton Park, MD      . docusate sodium (COLACE) capsule 100 mg  100 mg Oral BID Thornton Park, MD      . Derrill Memo ON 09/28/2018] DULoxetine (CYMBALTA) DR capsule 30 mg  30 mg Oral Daily Thornton Park, MD      . Derrill Memo ON 09/28/2018] enoxaparin (LOVENOX) injection 40 mg  40 mg Subcutaneous Q24H Thornton Park, MD      . Derrill Memo ON 09/28/2018] famotidine (PEPCID) tablet 20 mg  20 mg Oral Daily Thornton Park, MD      . Derrill Memo ON 10/01/2018] fluconazole (DIFLUCAN) tablet 200 mg  200 mg Oral Q Wilmon Arms, MD      . gabapentin (NEURONTIN) capsule 300 mg  300 mg Oral TID Thornton Park, MD      . hydrochlorothiazide (MICROZIDE) capsule 12.5 mg  12.5 mg Oral Daily Thornton Park, MD      . HYDROmorphone (DILAUDID) injection 0.5-1 mg  0.5-1 mg Intravenous Q4H PRN Thornton Park, MD      . Derrill Memo ON 09/28/2018] loratadine (CLARITIN) tablet 10 mg  10 mg Oral Daily Thornton Park, MD      . losartan (COZAAR) tablet 100 mg  100 mg Oral Daily Thornton Park, MD      . magnesium citrate solution 1 Bottle  1 Bottle Oral Once PRN Thornton Park, MD      . metFORMIN (GLUCOPHAGE) tablet 500 mg  500 mg Oral BID Thornton Park, MD      . methocarbamol (ROBAXIN) tablet 500 mg  500 mg Oral Q6H PRN Thornton Park, MD       Or  . methocarbamol (ROBAXIN) 500 mg in dextrose 5 % 50 mL IVPB  500 mg Intravenous Q6H PRN Thornton Park, MD      . metoCLOPramide (REGLAN) tablet 5-10 mg  5-10 mg Oral Q8H PRN Thornton Park, MD       Or  . metoCLOPramide (REGLAN) injection 5-10 mg  5-10 mg Intravenous Q8H PRN Thornton Park, MD      . Derrill Memo ON 09/28/2018] metoprolol succinate (TOPROL-XL) 24 hr tablet 25 mg  25 mg Oral Daily Thornton Park, MD      . ondansetron [facility]) tablet 4 mg  4 mg Oral Q6H PRN Thornton Park, MD       Or  . ondansetron [facility]) injection 4 mg  4 mg Intravenous Q6H PRN Thornton Park, MD      . oxyCODONE (Oxy IR/ROXICODONE) immediate release tablet 10-15 mg  10-15 mg Oral Q4H PRN Thornton Park, MD   10 mg at 09/27/18 1442  . oxyCODONE (Oxy  IR/ROXICODONE) immediate release tablet 5-10 mg  5-10 mg Oral Q4H PRN Thornton Park, MD      . Derrill Memo ON 09/28/2018] pravastatin (PRAVACHOL) tablet 40 mg  40 mg Oral Daily Thornton Park, MD      . senna-docusate (Senokot-S) tablet 1 tablet  1  tablet Oral QHS PRN Thornton Park, MD      . Derrill Memo ON 09/28/2018] tamsulosin [facility]) capsule 0.4 mg  0.4 mg Oral Q lunch Thornton Park, MD      . traMADol [facility]) tablet 50 mg  50 mg Oral Q6H Thornton Park, MD      . traZODone (DESYREL) tablet 50 mg  50 mg Oral QHS PRN Thornton Park, MD      . vitamin B-12 (CYANOCOBALAMIN) tablet 5,000 mcg  5,000 mcg Oral Daily Thornton Park, MD   5,000 mcg at 09/27/18 1441     Discharge Medications: Please see discharge summary for a list of discharge medications.  Relevant Imaging Results:  Relevant Lab Results:   Additional Information (SSN: [id number])  Steele Ledonne, Veronia Beets, LCSW

## 2018-09-27 NOTE — Evaluation (Signed)
Physical Therapy Evaluation Patient Details Name: Larry Roman. MRN: [id number] DOB: 1946/10/06 Today's Date: 09/27/2018   History of Present Illness  72 y/o male s/p R TKA 10/24, planning on L TKA in ~2 months.  Clinical Impression  PT highly motivated to with with PT POD0.  He reports 4/10 pain at rest and generally delt well with the pain t/o the PT exam and 15+ minutes of additional exercises/ambulation.  He had 84 degrees of flexion with gentle overpressure, but is lacking a few degrees of terminal knee extension.  He was able to ambulate ~40 ft with walker and though he had some difficulty his UE strength and great effort insured that he was safe t/o the bout.  Pt doing well and per POD0 performance will be able to go home when medically ready for d/c.    Follow Up Recommendations Home health PT    Equipment Recommendations  Rolling walker with 5" wheels    Recommendations for Other Services       Precautions / Restrictions Precautions Precautions: Knee;Fall Required Braces or Orthoses: Knee Immobilizer - Right Restrictions Weight Bearing Restrictions: Yes RLE Weight Bearing: Weight bearing as tolerated      Mobility  Bed Mobility Overal bed mobility: Independent             General bed mobility comments: Pt able to get to sitting w/o issue  Transfers Overall transfer level: Modified independent Equipment used: Rolling walker (2 wheeled)             General transfer comment: Cuing for hand placement, positioning and sequencing  Ambulation/Gait Ambulation/Gait assistance: Supervision Gait Distance (Feet): 40 Feet Assistive device: Rolling walker (2 wheeled)       General Gait Details: Pt was able to ambulate into the hallway with slow, but safe ambulation.  Pt reliant on the walker during R WBing and given L knee pain did essentially rely on walker/UEs pretty consistently. No LOBs or excessive fatigue  Stairs            Wheelchair Mobility     Modified Rankin (Stroke Patients Only)       Balance Overall balance assessment: Modified Independent                                           Pertinent Vitals/Pain Pain Assessment: 0-10 Pain Score: 4  Pain Location: R knee    Home Living Family/patient expects to be discharged to:: Private residence Living Arrangements: Spouse/significant other Available Help at Discharge: Family;Available 24 hours/day(wife will be taking next week off work)   Home Access: Stairs to enter Entrance Stairs-Rails: Right Technical brewer of Steps: 4   Home Equipment: None      Prior Function Level of Independence: Independent         Comments: Pt has been self limiting lately secondary to pain but is independent, drives, etc     Hand Dominance        Extremity/Trunk Assessment   Upper Extremity Assessment Upper Extremity Assessment: Overall WFL for tasks assessed    Lower Extremity Assessment Lower Extremity Assessment: Overall WFL for tasks assessed(expected R LE post op weakness)       Communication   Communication: No difficulties  Cognition Arousal/Alertness: Awake/alert Behavior During Therapy: WFL for tasks assessed/performed Overall Cognitive Status: Within Functional Limits for tasks assessed  General Comments      Exercises Total Joint Exercises Ankle Circles/Pumps: AROM;10 reps Quad Sets: Strengthening;10 reps Heel Slides: AROM;5 reps Hip ABduction/ADduction: Strengthening;5 reps Straight Leg Raises: AROM;10 reps Knee Flexion: PROM;5 reps Goniometric ROM: 3-84   Assessment/Plan    PT Assessment Patient needs continued PT services  PT Problem List Decreased strength;Decreased range of motion;Decreased activity tolerance;Decreased balance;Decreased mobility;Decreased coordination;Decreased knowledge of use of DME;Decreased safety awareness;Pain       PT Treatment  Interventions DME instruction;Gait training;Stair training;Functional mobility training;Therapeutic activities;Therapeutic exercise;Balance training;Neuromuscular re-education;Patient/family education    PT Goals (Current goals can be found in the Care Plan section)  Acute Rehab PT Goals Patient Stated Goal: go home PT Goal Formulation: With patient Time For Goal Achievement: 10/11/18 Potential to Achieve Goals: Good    Frequency BID   Barriers to discharge        Co-evaluation               AM-PAC PT "6 Clicks" Daily Activity  Outcome Measure Difficulty turning over in bed (including adjusting bedclothes, sheets and blankets)?: None Difficulty moving from lying on back to sitting on the side of the bed? : None Difficulty sitting down on and standing up from a chair with arms (e.g., wheelchair, bedside commode, etc,.)?: A Little Help needed moving to and from a bed to chair (including a wheelchair)?: None Help needed walking in hospital room?: None Help needed climbing 3-5 steps with a railing? : A Little 6 Click Score: 22    End of Session Equipment Utilized During Treatment: Gait belt Activity Tolerance: Patient tolerated treatment well Patient left: with chair alarm set;with call bell/phone within reach;with family/visitor present Nurse Communication: Mobility status PT Visit Diagnosis: Muscle weakness (generalized) (M62.81);Difficulty in walking, not elsewhere classified (R26.2)    Time: 7741-2878 PT Time Calculation (min) (ACUTE ONLY): 29 min   Charges:   PT Evaluation $PT Eval Low Complexity: 1 Low PT Treatments $Therapeutic Exercise: 8-22 mins        Kreg Shropshire, DPT 09/27/2018, 4:52 PM

## 2018-09-27 NOTE — Transfer of Care (Signed)
Immediate Anesthesia Transfer of Care Note  Patient: Larry Roman.  Procedure(s) Performed: TOTAL KNEE ARTHROPLASTY (Right Knee)  Patient Location: PACU  Anesthesia Type:spinal  Level of Consciousness: sedated  Airway & Oxygen Therapy: Patient Spontanous Breathing and Patient connected to face mask oxygen  Post-op Assessment: Report given to RN and Post -op Vital signs reviewed and stable  Post vital signs: Reviewed and stable  Last Vitals:  Vitals Value Taken Time  BP 99/65 09/27/2018 11:26 AM  Temp 36.2 C 09/27/2018 11:25 AM  Pulse 72 09/27/2018 11:35 AM  Resp 10 09/27/2018 11:35 AM  SpO2 98 % 09/27/2018 11:35 AM  Vitals shown include unvalidated device data.  Last Pain:  Vitals:   09/27/18 1125  TempSrc:   PainSc: 0-No pain      Patients Stated Pain Goal: 0 (03/97/95 3692)  Complications: No apparent anesthesia complications

## 2018-09-27 NOTE — Op Note (Signed)
DATE OF SURGERY:  09/27/2018 TIME: 12:04 PM  PATIENT NAME:  Larry Roman.   AGE: 72 y.o.    PRE-OPERATIVE DIAGNOSIS:  OSTEOARTHRITIS OF RIGHT KNEE  POST-OPERATIVE DIAGNOSIS:  Same  PROCEDURE:  Procedure(s): RIGHT TOTAL KNEE ARTHROPLASTY  SURGEON:  Thornton Park, MD   ASSISTANT:  Wyatt Portela, PA  OPERATIVE IMPLANTS: Depuy PFC Sigma, Posterior Stabilized Femural component size 5, Tibia size rotating platform component size 4, Patella polyethylene 3-peg oval button size 38, with a 10 mm polyethylene insert.   PREOPERATIVE INDICATIONS:  [name]. is an 72 y.o. male who has a diagnosis of tricompartmental osteoarthritis and elected for a right total knee arthroplasty after failing nonoperative treatment, including corticosteroid and hyaluronic acid injections, who has significant impairment of their activities of daily living due to their right knee pain.  Radiographs have demonstrated tricompartmental osteoarthritis joint space narrowing, osteophytes, subchondral sclerosis and cyst formation.  The risks, benefits, and alternatives were discussed at length including but not limited to the risks of infection, bleeding, nerve or blood vessel injury, knee stiffness, fracture, dislocation, loosening or failure of the hardware and the need for further surgery. Medical risks include but not limited to DVT and pulmonary embolism, myocardial infarction, stroke, pneumonia, respiratory failure and death. I discussed these risks with the patient in my office prior to the date of surgery. They understood these risks and were willing to proceed.  OPERATIVE FINDINGS AND UNIQUE ASPECTS OF THE CASE: Advanced tricompartmental osteoarthritis with diffuse exposed bone of the tibial femoral joint and large osteophytes.  OPERATIVE DESCRIPTION:  The patient was brought to the operative room and placed in a supine position after undergoing placement of a spinal anesthetic.  A Foley catheter was  placed.  IV antibiotics were given. Patient received 2 g of Ancef and 600 mg of clindamycin. The lower extremity was prepped and draped in the usual sterile fashion.  A time out was performed to verify the patient's name, date of birth, medical record number, correct site of surgery and correct procedure to be performed. The timeout was also used to confirm the patient received antibiotics and that appropriate instruments, implants and radiographs studies were available in the room.  The leg was elevated and exsanguinated with an Esmarch and the tourniquet was inflated to 275 mmHg for 120 minutes..  A midline incision was made over the right knee. Full-thickness skin flaps were developed. A medial parapatellar arthrotomy was then made and the patella everted and the knee was brought into 90 of flexion. Hoffa's fat pad along with the cruciate ligaments and medial and lateral menisci were resected.   The distal femoral intramedullary canal was opened with a drill and the intramedullary distal femoral cutting jig was inserted into the femoral canal pinned into position. It was set at 5 degrees resecting 12 mm off the distal femur given his flexion contracture of approximately 10 to 15 degrees.  Care was taken to protect the collateral ligaments during distal femoral resection.  The distal femoral resection was performed with an oscillating saw. The femoral cutting guide was then removed.  The extramedullary tibial cutting guide was then placed using the anterior tibial crest and second ray of the foot as a references.  The tibial cutting guide was adjusted to allow for appropriate posterior slope.  The tibial cutting block was pinned into position. The slotted stylus was used to measure the proximal tibial resection of 10 mm off the high lateral side.  The tibial long  rod alignment guide was then used to confirm position of the cutting block. A third cross pin through the tibial cutting block was then drilled  into position to allow for rotational stability. Care was taken during the tibial resection to protect the medial and collateral ligaments.  The resected tibial bone was removed along with the posterior horns of the menisci.  The PCL was sacrificed.  Extension gap was measured with a spacer block and alignment and extension was confirmed using a long alignment rod.  The attention was then turned back to the femur. The posterior referencing distal femoral sizing guide was applied to the distal femur.  The femur was sized to be a size 5. Rotation of the referencing guide was checked with the epicondylar axis and Whitesides line. Then the 4-in-1 cutting jig was then applied to the distal femur. A stylus was used to confirm that the anterior femur would not be notched.   Then the anterior, posterior and chamfer femoral cuts were then made with an oscillating saw.  All posterior osteophytes were removed.  The flexion gap was then measured with a flexion spacer block and long alignment rod and was found to be symmetric with the extension gap and perpendicular to mechanical axis of the tibia.  The distal femoral preparation was completed by performing the posterior stabilized box cut using the cutting block. The entry site for the intramedullary femoral guide was filled with autologous bone graft from bone previously resected earlier in the case.  The proximal tibia plateau was then sized with trial trays. The best coverage was achieved with a size 4. This tibial tray was then pinned into position. The proximal tibia was then prepared with the reamer and keel punch.  After tibial preparation was completed, all trial components were inserted with polyethylene trials.  The knee was found to have excellent balance and full motion with a size 10 mm tibial polyethylene insert..    The attention was then turned to preparation of the patella. The thickness of the patella was measured with a caliper, the diameter measured  with the patella templates.  The patella resection was then made with an oscillating saw using the patella cutting guide.  The final patellar thickness 24 mm.  3 peg holes for the patella component were then drilled. The trial patella was then placed. Knee was taken through a full range of motion and deemed to be stable with the trial components. All trial components were then removed. The knee capsule was then injected with Exparel and a mixture of Toradol, morphine and Marcaine. The joint was copiously irrigated with pulse lavage.  The final total knee arthroplasty components were then cemented into place with a 10 mm trial polyethylene insert and all excess methylmethacrylate was removed.  The joint was again copiously irrigated. After the cement had hardened the knee was again taken through a full range of motion. It was felt to be most stable with the 10 mm tibial polyethylene insert. The actual tibial polyethylene insert was then placed.   The knee was taken through a range of motion and the patella tracked well and the knee was again irrigated copiously.    An Autovac drain was placed. The 2 drain limbs were brought out through the superior lateral knee. The medial arthrotomy was closed with #1 Ethibond. The subcutaneous tissue closed with 0 and 2-0 vicryl, and skin approximated with staples.  A dry sterile and compressive dressing was applied.  A Polar Care was applied  to the operative knee along with a knee immobilizer.  The patient was awakened and brought to the PACU in stable and satisfactory condition.  All sharp, lap and instrument counts were correct at the conclusion the case. I spoke with the patient's wife in the postop consultation room to let her know the case had been performed without complication and the patient was stable in recovery room.   Total tourniquet time was 120 minutes.

## 2018-09-27 NOTE — Anesthesia Post-op Follow-up Note (Signed)
Anesthesia QCDR form completed.        

## 2018-09-27 NOTE — Progress Notes (Signed)
Pt received to room 135 via bed from PACU. Pt alert and oriented. Dressing right knee dry and intact. Immobilizer intact. Polar Care intact. Pt oriented to room. Denies pain or discomfort at this time. Wife at bedside.

## 2018-09-27 NOTE — OR Nursing (Signed)
Patient remains with a reddened area just below right knee that is about the size of a half dollar.  There is no open areas that have drainage but it still looks like a rash/abrasion.  Patient denies pain or bruising sensation in this region. Took all his prescribed antibiotics since cancelled 3 weeks ago.

## 2018-09-27 NOTE — Anesthesia Procedure Notes (Addendum)
Spinal  Patient location during procedure: OR Start time: 09/27/2018 8:00 AM End time: 09/27/2018 8:16 AM Staffing Anesthesiologist: Fitzgerald, Kathryn L, MD Resident/CRNA: Fletcher, Patricia, CRNA Performed: anesthesiologist and resident/CRNA  Preanesthetic Checklist Completed: patient identified, site marked, surgical consent, pre-op evaluation, timeout performed, IV checked, risks and benefits discussed and monitors and equipment checked Spinal Block Patient position: sitting Prep: ChloraPrep Patient monitoring: heart rate, continuous pulse ox, blood pressure and cardiac monitor Approach: midline Location: L3-4 Injection technique: single-shot Needle Needle type: Whitacre and Introducer  Needle gauge: 24 G Needle length: 9 cm Additional Notes Negative paresthesia. Negative blood return. Positive free-flowing CSF. Expiration date of kit checked and confirmed. Patient tolerated procedure well, without complications.       

## 2018-09-27 NOTE — Progress Notes (Signed)
IS education complete, pt understands technique and reason for use. Pt inspire 2543ml+. Pt tol well. Pt independent with use.

## 2018-09-27 NOTE — Progress Notes (Signed)
  Subjective:  POST OP CHECK: Patient seen in his room with his wife at the bedside.  Galen from physical therapy is also present and beginning to work with the patient.  Patient reports right knee pain as mild.  Patient can feel his feet and can move his feet.  The spinal block is wearing off.  He recently took oral pain medication.  Objective:   VITALS:   Vitals:   09/27/18 1303 09/27/18 1333 09/27/18 1432 09/27/18 1530  BP: 114/76 119/73 116/70 132/78  Pulse: 66 (!) 57 (!) 59 60  Resp:      Temp: 98.6 F (37 C)     TempSrc: Oral     SpO2: 99% 99% 98% 98%  Weight:      Height:        PHYSICAL EXAM: Right lower extremity: Neurovascular intact Sensation intact distally Intact pulses distally Dorsiflexion/Plantar flexion intact Compartment soft  LABS  Results for orders placed or performed during the hospital encounter of 09/27/18 (from the past 24 hour(s))  Glucose, capillary     Status: Abnormal   Collection Time: 09/27/18  6:10 AM  Result Value Ref Range   Glucose-Capillary 134 (H) 70 - 99 mg/dL  Type and screen [place]     Status: None   Collection Time: 09/27/18  6:48 AM  Result Value Ref Range   ABO/RH(D) B POS    Antibody Screen NEG    Sample Expiration      09/30/2018 Performed at [facility], [address] [place], [place] [address]   Glucose, capillary     Status: Abnormal   Collection Time: 09/27/18 11:38 AM  Result Value Ref Range   Glucose-Capillary 151 (H) 70 - 99 mg/dL    Dg Knee Right Port  Result Date: 09/27/2018 CLINICAL DATA:  Status post right knee replacement EXAM: PORTABLE RIGHT KNEE - 1-2 VIEW COMPARISON:  None. FINDINGS: Right knee prosthesis is noted in satisfactory position. No acute bony or soft tissue abnormality is noted. IMPRESSION: Status post right knee replacement Electronically Signed   By: Inez Catalina M.D.   On: 09/27/2018 12:44    Assessment/Plan: Day of Surgery   Active Problems:  S/P TKR (total knee replacement) using cement, right  Patient is stable postop.  Patient will complete 24 hours of postop antibiotics.  His Foley catheter will be removed in the morning.  Labs will be drawn in the morning.  Patient will continue physical therapy tomorrow.  He will begin on Lovenox for DVT prophylaxis tomorrow.  Patient is weightbearing as tolerated on the right lower extremity.  I have reviewed the postoperative x-ray.  The arthroplasty components are well-positioned.  There is no evidence of postop complication.  The patient will continue use of his Polar Care and elevate the right lower extremity overnight to reduce swelling.   Thornton Park , MD 09/27/2018, 3:57 PM

## 2018-09-27 NOTE — Anesthesia Preprocedure Evaluation (Addendum)
Anesthesia Evaluation  Patient identified by MRN, date of birth, ID band Patient awake    Reviewed: Allergy & Precautions, H&P , NPO status , Patient's Chart, lab work & pertinent test results  Airway Mallampati: III  TM Distance: <3 FB     Dental  (+) Teeth Intact   Pulmonary asthma , sleep apnea ,    breath sounds clear to auscultation       Cardiovascular hypertension, negative cardio ROS   Rhythm:regular Rate:Normal  RBBB   Neuro/Psych negative neurological ROS  negative psych ROS   GI/Hepatic negative GI ROS, Neg liver ROS,   Endo/Other  diabetes  Renal/GU      Musculoskeletal   Abdominal   Peds  Hematology negative hematology ROS (+)   Anesthesia Other Findings Past Medical History: No date: Depression No date: DM (diabetes mellitus) (HCC) No date: Extrinsic asthma, unspecified     Comment:  childhood No date: Hives of unknown origin No date: HTN (hypertension) No date: Other allergy, other than to medicinal agents No date: RBBB No date: Sleep apnea     Comment:  25 years ago mild lost weight no longer uses cpap 08/2018: Wound of right leg     Comment:  area just below knee size of quarter, reddened around               edges  Past Surgical History: 1995: BACK SURGERY     Comment:  ruptured disc encapsulated nerves 2016: CATARACT EXTRACTION, BILATERAL     Comment:  Dr. Kerman Passey at [facility] No date: EYE SURGERY; Bilateral     Comment:  cataract extractions No date: TONSILLECTOMY  BMI    Body Mass Index:  30.27 kg/m      Reproductive/Obstetrics negative OB ROS                            Anesthesia Physical Anesthesia Plan  ASA: III  Anesthesia Plan: Spinal   Post-op Pain Management:    Induction:   PONV Risk Score and Plan: Propofol infusion  Airway Management Planned:   Additional Equipment:   Intra-op Plan:   Post-operative Plan:    Informed Consent: I have reviewed the patients History and Physical, chart, labs and discussed the procedure including the risks, benefits and alternatives for the proposed anesthesia with the patient or authorized representative who has indicated his/her understanding and acceptance.   Dental Advisory Given  Plan Discussed with: Anesthesiologist, CRNA and Surgeon  Anesthesia Plan Comments:         Anesthesia Quick Evaluation

## 2018-09-27 NOTE — H&P (Signed)
PREOPERATIVE H&P  Chief Complaint: OSTEOARTHRITIS OF RIGHT KNEE  HPI: Larry Roman. is a 72 y.o. male who presents for preoperative history and physical with a diagnosis of OSTEOARTHRITIS OF RIGHT KNEE. Symptoms of pain are rated as moderate to severe, and have been worsening.  This is significantly impairing activities of daily living.  He has elected for surgical management.  Radiographs demonstrate joint space narrowing, subchondral sclerosis, and marginal osteophytes.  Past Medical History:  Diagnosis Date  . Depression   . DM (diabetes mellitus) ([place])   . Extrinsic asthma, unspecified    childhood  . Hives of unknown origin   . HTN (hypertension)   . Other allergy, other than to medicinal agents   . RBBB   . Sleep apnea    25 years ago mild lost weight no longer uses cpap  . Wound of right leg 08/2018   area just below knee size of quarter, reddened around edges   Past Surgical History:  Procedure Laterality Date  . BACK SURGERY  1995   ruptured disc encapsulated nerves  . CATARACT EXTRACTION, BILATERAL  2016   Dr[facility] Kerman [facility]sey at [facility]  . EYE SURGERY Bilateral    cataract extractions  . TONSILLECTOMY     Social History   Socioeconomic History  . Marital status: Married    Spouse name: Pam  . Number of children: 2  . Years of education: Not on file  . Highest education level: Not on file  Occupational History    Employer: [employer]    Comment: retired  Scientific laboratory technician  . Financial resource strain: Not hard at all  . Food insecurity:    Worry: Never true    Inability: Never true  . Transportation needs:    Medical: No    Non-medical: No  Tobacco Use  . Smoking status: Never Smoker  . Smokeless tobacco: Never Used  . Tobacco comment: tobacco use - no  Substance and Sexual Activity  . Alcohol use: Yes    Alcohol/week: 0.0 standard drinks    Comment: rare  . Drug use: No  . Sexual activity: Yes  Lifestyle  . Physical activity:     Days per week: Not on file    Minutes per session: Not on file  . Stress: Not at all  Relationships  . Social connections:    Talks on phone: Not on file    Gets together: Not on file    Attends religious service: Not on file    Active member of club or organization: Not on file    Attends meetings of clubs or organizations: Not on file    Relationship status: Not on file  Other Topics Concern  . Not on file  Social History Narrative  . Not on file   Family History  Problem Relation Age of Onset  . Heart failure Father   . Heart attack Mother    No Known Allergies Prior to Admission medications   Medication Sig Start Date End Date Taking? Authorizing Provider  acetaminophen (TYLENOL) 650 MG CR tablet Take 1,300 mg by mouth daily.   Yes [provider]  amLODipine (NORVASC) 10 MG tablet Take 1 tablet (10 mg total) by mouth daily with lunch. 09/24/18  Yes Burnard Hawthorne, FNP  aspirin EC 81 MG tablet Take 81 mg by mouth daily.     Yes [provider]  azelastine (ASTELIN) 0.1 % nasal spray USE 1 SPRAY IN EACH NOSTRIL TWICE  DAILY AS DIRECTED Patient taking differently: Place 1 spray into both nostrils 2 (two) times daily as needed for rhinitis or allergies.  04/17/18  Yes Burnard Hawthorne, FNP  Cholecalciferol (VITAMIN D3) 2000 UNITS TABS Take 2,000 Units by mouth daily.    Yes [provider]  Cyanocobalamin (B-12) 5000 MCG SUBL Place 5,000 mcg under the tongue daily.    Yes [provider]  DULoxetine (CYMBALTA) 30 MG capsule TAKE 1 CAPSULE BY MOUTH EVERY DAY Patient taking differently: Take 30 mg by mouth daily.  04/12/18  Yes Burnard Hawthorne, FNP  fluconazole (DIFLUCAN) 200 MG tablet Take 200 mg by mouth every Monday.  12/15/17  Yes [provider]  hydrochlorothiazide (MICROZIDE) 12.5 MG capsule TAKE 1 CAPSULE BY MOUTH EVERY DAY Patient taking differently: Take 12.5 mg by mouth daily.  07/09/18  Yes Arnett, Yvetta Coder, FNP   loratadine (CLARITIN) 10 MG tablet Take 10 mg by mouth daily.   Yes [provider]  losartan (COZAAR) 100 MG tablet TAKE 1 TABLET BY MOUTH EVERY DAY Patient taking differently: Take 100 mg by mouth daily. TAKE 1 TABLET BY MOUTH EVERY DAY 05/21/18  Yes Arnett, Yvetta Coder, FNP  metFORMIN (GLUCOPHAGE) 500 MG tablet TAKE 1 TABLET (500 MG TOTAL) BY MOUTH 2 (TWO) TIMES DAILY WITH A MEAL. Patient taking differently: Take 500 mg by mouth 2 (two) times daily.  04/20/18  Yes Burnard Hawthorne, FNP  metoprolol succinate (TOPROL-XL) 25 MG 24 hr tablet TAKE 1 TABLET BY MOUTH EVERY DAY 07/26/18  Yes Burnard Hawthorne, FNP  pravastatin (PRAVACHOL) 40 MG tablet TAKE 1 TABLET BY MOUTH EVERY DAY 02/26/18  Yes Burnard Hawthorne, FNP  ranitidine (ZANTAC) 75 MG tablet Take 75 mg by mouth daily.    Yes [provider]  tamsulosin (FLOMAX) 0.4 MG CAPS capsule TAKE 1 CAPSULE BY MOUTH EVERY DAY Patient taking differently: Take 0.4 mg by mouth daily with lunch.  06/19/18  Yes Burnard Hawthorne, FNP  traZODone (DESYREL) 50 MG tablet TAKE 1 TABLET (50 MG TOTAL) BY MOUTH AT BEDTIME AS NEEDED FOR SLEEP. 07/09/18  Yes Arnett, Yvetta Coder, FNP  ACCU-CHEK SOFTCLIX LANCETS lancets Use up to 4 times daily to check blood sugar. Diagnosis E11.9 10/22/16   Coral Spikes, DO  ONETOUCH VERIO test strip TEST 3 TIMES A DAY 02/09/18   Burnard Hawthorne, FNP     Positive ROS: All other systems have been reviewed and were otherwise negative with the exception of those mentioned in the HPI and as above.  Physical Exam: General: Alert, no acute distress Cardiovascular: Regular rate and rhythm, no murmurs rubs or gallops.  No pedal edema Respiratory: Clear to auscultation bilaterally, no wheezes rales or rhonchi. No cyanosis, no use of accessory musculature GI: No organomegaly, abdomen is soft and non-tender nondistended with positive bowel sounds. Skin: Skin intact, no lesions within the operative field. Neurologic:  Sensation intact distally Psychiatric: Patient is competent for consent with normal mood and affect Lymphatic: No  cervical lymphadenopathy  MUSCULOSKELETAL: Right knee: Patient skin is intact.  There is subcutaneous ecchymosis over the tibial tubercle from a previous injury several weeks ago.  The skin remains intact and there is no drainage from this area.  There is no fluctuance.  Patient has range of motion from -5 to 110 degrees.  No ligamentous laxity.  He is 5-5 strength in all muscles, intact sensation light touch and palpable pedal pulses.  He has patellofemoral crepitus but no grind  or apprehension.  Assessment: OSTEOARTHRITIS OF RIGHT KNEE  Plan: Plan for Procedure(s): RIGHT TOTAL KNEE ARTHROPLASTY  Reviewed with the patient the details of the operation in my office prior to the date of surgery.  I use a 3-dimensional model to explain what will be done during the surgery.  We also discussed the postoperative course in detail.  He has attended the preoperative joint class at the hospital.  I also discussed the risks and benefits of surgery.  He understands the risks include but are not limited to infection, bleeding requiring blood transfusion, nerve or blood vessel injury, joint stiffness or loss of motion, persistent pain, weakness or instability, fracture, dislocation and hardware failure or loosening and the need for further surgery. Medical risks include but are not limited to DVT and pulmonary embolism, myocardial infarction, stroke, pneumonia, respiratory failure and death. Patient understood these risks and wished to proceed.    Thornton Park, MD   09/27/2018 7:42 AM

## 2018-09-28 LAB — BASIC METABOLIC PANEL
ANION GAP: 8 (ref 5–15)
BUN: 21 mg/dL (ref 8–23)
CALCIUM: 9 mg/dL (ref 8.9–10.3)
CO2: 30 mmol/L (ref 22–32)
CREATININE: 0.92 mg/dL (ref 0.61–1.24)
Chloride: 101 mmol/L (ref 98–111)
Glucose, Bld: 125 mg/dL — ABNORMAL HIGH (ref 70–99)
POTASSIUM: 3.8 mmol/L (ref 3.5–5.1)
SODIUM: 139 mmol/L (ref 135–145)

## 2018-09-28 LAB — CBC
HCT: 33.5 % — ABNORMAL LOW (ref 39.0–52.0)
Hemoglobin: 10.8 g/dL — ABNORMAL LOW (ref 13.0–17.0)
MCH: 31.3 pg (ref 26.0–34.0)
MCHC: 32.2 g/dL (ref 30.0–36.0)
MCV: 97.1 fL (ref 80.0–100.0)
PLATELETS: 235 10*3/uL (ref 150–400)
RBC: 3.45 MIL/uL — AB (ref 4.22–5.81)
RDW: 12.3 % (ref 11.5–15.5)
WBC: 10 10*3/uL (ref 4.0–10.5)
nRBC: 0 % (ref 0.0–0.2)

## 2018-09-28 NOTE — Progress Notes (Signed)
  Subjective:  POD #1 s/p right TKA.  Patient reports left knee pain as mild to moderate.  Patient is sitting up on the side of the bed.  Foley catheter is been removed.  Objective:   VITALS:   Vitals:   09/28/18 0406 09/28/18 0748 09/28/18 1220 09/28/18 1441  BP: 138/74 (!) 154/83 138/74 129/76  Pulse: 88 (!) 102 88 77  Resp: 16     Temp: 98.5 F (36.9 C) 99.9 F (37.7 C) 99.6 F (37.6 C) 98 F (36.7 C)  TempSrc: Oral Oral Oral Oral  SpO2: 93% 92% 93% 95%  Weight: 102.2 kg     Height:        PHYSICAL EXAM: Right lower extremity: Neurovascular intact Sensation intact distally Intact pulses distally Dorsiflexion/Plantar flexion intact Incision: dressing C/D/I Compartment soft  LABS  Results for orders placed or performed during the hospital encounter of 09/27/18 (from the past 24 hour(s))  CBC     Status: Abnormal   Collection Time: 09/28/18  3:22 AM  Result Value Ref Range   WBC 10.0 4.0 - 10.5 K/uL   RBC 3.45 (L) 4.22 - 5.81 MIL/uL   Hemoglobin 10.8 (L) 13.0 - 17.0 g/dL   HCT 33.5 (L) 39.0 - 52.0 %   MCV 97.1 80.0 - 100.0 fL   MCH 31.3 26.0 - 34.0 pg   MCHC 32.2 30.0 - 36.0 g/dL   RDW 12.3 11.5 - 15.5 %   Platelets 235 150 - 400 K/uL   nRBC 0.0 0.0 - 0.2 %  Basic metabolic panel     Status: Abnormal   Collection Time: 09/28/18  3:22 AM  Result Value Ref Range   Sodium 139 135 - 145 mmol/L   Potassium 3.8 3.5 - 5.1 mmol/L   Chloride 101 98 - 111 mmol/L   CO2 30 22 - 32 mmol/L   Glucose, Bld 125 (H) 70 - 99 mg/dL   BUN 21 8 - 23 mg/dL   Creatinine, Ser 0.92 0.61 - 1.24 mg/dL   Calcium 9.0 8.9 - 10.3 mg/dL   GFR calc non Af Amer >60 >60 mL/min   GFR calc Af Amer >60 >60 mL/min   Anion gap 8 5 - 15    Dg Knee Right Port  Result Date: 09/27/2018 CLINICAL DATA:  Status post right knee replacement EXAM: PORTABLE RIGHT KNEE - 1-2 VIEW COMPARISON:  None. FINDINGS: Right knee prosthesis is noted in satisfactory position. No acute bony or soft tissue  abnormality is noted. IMPRESSION: Status post right knee replacement Electronically Signed   By: Inez Catalina M.D.   On: 09/27/2018 12:44    Assessment/Plan: 1 Day Post-Op   Active Problems:   S/P TKR (total knee replacement) using cement, right  Patient is doing well from orthopedic standpoint.  Foley catheter has been removed.  I reviewed the patient's labs.  His hemoglobin and hematocrit are stable.  Patient will begin Lovenox today for DVT prophylaxis.  Continue physical therapy.  I will change the patient's dressing tomorrow.   Thornton Park , MD 09/28/2018, 2:51 PM

## 2018-09-28 NOTE — Progress Notes (Signed)
Clinical Social Worker (CSW) received SNF consult. PT is recommending home health. RN case manager aware of above. Please reconsult if future social work needs arise. CSW signing off.   Clifton Kovacic, LCSW [phone] 

## 2018-09-28 NOTE — Progress Notes (Signed)
Physical Therapy Treatment Patient Details Name: Larry Roman. MRN: [id number] DOB: [birth year] Today's Date: 09/28/2018    History of Present Illness 72 y/o male s/p R TKA 10/24, planning on L TKA in ~2 months.    PT Comments    Pt with increased pain and guarding this AM.  He was still very willing to work hard with PT and push himself but had more difficulty with most acts.  He did need light assist to attain standing (as well as elevated surface ~3") and clearly was more hesitant to take full weight on R LE/required heavy UE use on walker.  He still lacks TKE and even with gentle overpressure after warm up exercises is missing at least 3 degrees.  Pt worked hard t/o session despite clear discomfort with most tasks.  Pt still appropriate for home on d/c, encouraged to be aware of resting knee positioning and encouraged to work extensively on quad sets/TKE.      Follow Up Recommendations  Home health PT     Equipment Recommendations  Rolling walker with 5" wheels    Recommendations for Other Services       Precautions / Restrictions Precautions Precautions: Knee;Fall Required Braces or Orthoses: Knee Immobilizer - Right Restrictions RLE Weight Bearing: Weight bearing as tolerated    Mobility  Bed Mobility Overal bed mobility: Independent             General bed mobility comments: Pt slower and more cautious with getting to sitting EOB today, still does not need assist  Transfers Overall transfer level: Needs assistance Equipment used: Rolling walker (2 wheeled) Transfers: Sit to/from Stand Sit to Stand: Min assist         General transfer comment: Pt needed elevated surface, increased cuing for set up and actually needed some light assist to keep weight forward and attain fully upright standing  Ambulation/Gait Ambulation/Gait assistance: Supervision Gait Distance (Feet): 35 Feet Assistive device: Rolling walker (2 wheeled)       General Gait Details: Pt  initially very guarded with ambulation/WBing and generally was less confident and steady than on eval (POD0).  He was heavily reliant on UEs and though he did not have any buckling (in [place]) he clearly lacked confidence with [place] on R.  Pt very fatigued with the modest bout of ambulation, but showed great effort and determination.  Unable to step-through with L LE despite cuing.    Stairs             Wheelchair Mobility    Modified Rankin (Stroke Patients Only)       Balance Overall balance assessment: Modified Independent                                          Cognition Arousal/Alertness: Awake/alert Behavior During Therapy: WFL for tasks assessed/performed Overall Cognitive Status: Within Functional Limits for tasks assessed                                        Exercises Total Joint Exercises Ankle Circles/Pumps: AROM;10 reps Quad Sets: Strengthening;10 reps Gluteal Sets: AROM;10 reps Heel Slides: 10 reps;AAROM Hip ABduction/ADduction: 10 reps;AAROM Straight Leg Raises: AAROM;10 reps Knee Flexion: PROM;5 reps Goniometric ROM: 3-80    General Comments  Pertinent Vitals/Pain Pain Assessment: 0-10 Pain Score: 8  Pain Location: Pt clearly more guarded and sensitive today    Home Living                      Prior Function            PT Goals (current goals can now be found in the care plan section) Progress towards PT goals: Progressing toward goals    Frequency    BID      PT Plan Current plan remains appropriate    Co-evaluation              AM-PAC PT "6 Clicks" Daily Activity  Outcome Measure  Difficulty turning over in bed (including adjusting bedclothes, sheets and blankets)?: None Difficulty moving from lying on back to sitting on the side of the bed? : A Little Difficulty sitting down on and standing up from a chair with arms (e.g., wheelchair, bedside commode, etc,.)?: A Lot Help  needed moving to and from a bed to chair (including a wheelchair)?: None Help needed walking in hospital room?: A Little Help needed climbing 3-5 steps with a railing? : A Lot 6 Click Score: 18    End of Session Equipment Utilized During Treatment: Gait belt Activity Tolerance: Patient tolerated treatment well Patient left: with chair alarm set;with call bell/phone within reach;with family/visitor present   PT Visit Diagnosis: Muscle weakness (generalized) (M62.81);Difficulty in walking, not elsewhere classified (R26.2)     Time: 4327-6147 PT Time Calculation (min) (ACUTE ONLY): 40 min  Charges:  $Gait Training: 8-22 mins $Therapeutic Exercise: 23-37 mins                     Kreg Shropshire, DPT 09/28/2018, 8:53 AM

## 2018-09-28 NOTE — Evaluation (Signed)
Occupational Therapy Evaluation Patient Details Name: Larry Roman. MRN: [id number] DOB: 1946/05/25 Today's Date: 09/28/2018    History of Present Illness Pt. is a 72 y.o. male who was admitted to [facility] s/p Right TKA. Pt. has a history of a Left TKA approximately 2 months ago.   Clinical Impression   Pt. presents with 8/10 right knee pain, weakness, limited activity tolerance, and limited functional mobility which hinders his ability to complete basic ADL and IADL functioning. Pt. resides at home with his wife. Pt. was independent with ADLs, and IADL functioning including: meal preparation, and medication management. Pt. was able to drive. Pt. Education was provided about A/E use for LE ADLs.  Pt. has a reacher at home, and is aware of it general use. Pt. requires assist for LE ADL tasks, and could benefit from practice utilizing the equipment during ADL tasks. Pt. could benefit from OT services for ADL training, A/E training, and pt. education about home modification, and DME. Pt. Plans to return home upon discharge, with family assistance as needed. No follow-up OT services are warranted at this time.    Follow Up Recommendations  No OT follow up    Equipment Recommendations       Recommendations for Other Services       Precautions / Restrictions Precautions Precautions: Knee;Fall Required Braces or Orthoses: Knee Immobilizer - Right Restrictions Weight Bearing Restrictions: Yes RLE Weight Bearing: Weight bearing as tolerated      Mobility Bed Mobility Overal bed mobility: Independent             General bed mobility comments: Pt slower and more cautious with getting to sitting EOB today, still does not need assist  Transfers Overall transfer level: Needs assistance Equipment used: Rolling walker (2 wheeled) Transfers: Sit to/from Stand Sit to Stand: Min assist         General transfer comment: Mobility per PT.    Balance Overall balance assessment: Modified  Independent                                         ADL either performed or assessed with clinical judgement   ADL Overall ADL's : Needs assistance/impaired Eating/Feeding: Set up;Independent   Grooming: Set up;Sitting;Independent   Upper Body Bathing: Set up;Sitting;Min guard   Lower Body Bathing: Set up;Moderate assistance   Upper Body Dressing : Set up;Min guard   Lower Body Dressing: Set up;Moderate assistance                 General ADL Comments: Pt. education was provided about A/E use for LE ADLs.      Vision Baseline Vision/History: Wears glasses Wears Glasses: Reading only Patient Visual Report: No change from baseline       Perception     Praxis      Pertinent Vitals/Pain Pain Assessment: 0-10 Pain Score: 8  Pain Location: Pt clearly more guarded and sensitive today Pain Intervention(s): Limited activity within patient's tolerance;Monitored during session;Repositioned     Hand Dominance Right   Extremity/Trunk Assessment Upper Extremity Assessment Upper Extremity Assessment: Overall WFL for tasks assessed           Communication Communication Communication: No difficulties   Cognition Arousal/Alertness: Awake/alert Behavior During Therapy: WFL for tasks assessed/performed Overall Cognitive Status: Within Functional Limits for tasks assessed  General Comments       Exercises    Shoulder Instructions      Home Living Family/patient expects to be discharged to:: Private residence Living Arrangements: Spouse/significant other Available Help at Discharge: Family;Available 24 hours/day   Home Access: Stairs to enter Entrance Stairs-Number of Steps: 1 Entrance Stairs-Rails: Right Home Layout: One level     Bathroom Shower/Tub: Walk-in shower;Curtain         Home Equipment: Cane - quad;Shower seat - built in(Lift chair, Secondary school teacher)          Prior  Functioning/Environment Level of Independence: Independent        Comments: Independent with ADLs, and IADLs.         OT Problem List: Decreased strength;Decreased knowledge of use of DME or AE;Impaired UE functional use;Pain;Decreased range of motion      OT Treatment/Interventions: Self-care/ADL training;Therapeutic exercise;Patient/family education;Therapeutic activities;DME and/or AE instruction    OT Goals(Current goals can be found in the care plan section) Acute Rehab OT Goals Patient Stated Goal: To go home OT Goal Formulation: With patient Potential to Achieve Goals: Good  OT Frequency: Min 2X/week   Barriers to D/C:            Co-evaluation              AM-PAC PT "6 Clicks" Daily Activity     Outcome Measure Help from another person eating meals?: None Help from another person taking care of personal grooming?: A Little Help from another person toileting, which includes using toliet, bedpan, or urinal?: A Little Help from another person bathing (including washing, rinsing, drying)?: A Lot Help from another person to put on and taking off regular upper body clothing?: A Little Help from another person to put on and taking off regular lower body clothing?: A Lot 6 Click Score: 17   End of Session Equipment Utilized During Treatment: Gait belt  Activity Tolerance: Patient limited by pain Patient left: in chair;with call bell/phone within reach;with chair alarm set  OT Visit Diagnosis: Muscle weakness (generalized) (M62.81);Pain Pain - Right/Left: Right Pain - part of body: Knee                Time: 6195-0932 OT Time Calculation (min): 18 min Charges:  OT General Charges $OT Visit: 1 Visit OT Evaluation $OT Eval Moderate Complexity: 1 Mod  Harrel Carina, MS, OTR/L  Harrel Carina 09/28/2018, 11:03 AM

## 2018-09-28 NOTE — Care Management Note (Signed)
Case Management Note  Patient Details  Name: Damani Kelemen. MRN: [id number] Date of Birth: [birth year]  Subjective/Objective:   POD # 1 right knee arthroplasty. Patient lives at home with his wife who will be his caregiver. [facility] chose [facility]. Referral sent to [facility] for HHPT. IT is anticipated patient will discharge tomorrow. He will need a walker. Ordered from [place] with [facility]. Pharmacy: [facility]: [phone]  Action/Plan: [facility] for HHPT, walker from [facility].   Expected Discharge Date:                  Expected Discharge Plan:  [place]  In-House Referral:     Discharge planning Services  CM Consult  Post Acute Care Choice:  Durable Medical Equipment, Home Health Choice offered to:  Patient  DME Arranged:  Walker rolling DME Agency:  [place]:  PT [place] Agency:  [facility] ([facility])  Status of Service:  In process, will continue to follow  If discussed at Long Length of Stay Meetings, dates discussed:    Additional Comments:  Jolly Mango, RN 09/28/2018, 11:55 AM

## 2018-09-28 NOTE — Anesthesia Postprocedure Evaluation (Signed)
Anesthesia Post Note  Patient: Larry Roman.  Procedure(s) Performed: TOTAL KNEE ARTHROPLASTY (Right Knee)  Patient location during evaluation: PACU Anesthesia Type: Spinal and General Level of consciousness: awake and alert Pain management: pain level controlled Vital Signs Assessment: post-procedure vital signs reviewed and stable Respiratory status: spontaneous breathing, nonlabored ventilation, respiratory function stable and patient connected to nasal cannula oxygen Cardiovascular status: blood pressure returned to baseline and stable Postop Assessment: no apparent nausea or vomiting Anesthetic complications: no     Last Vitals:  Vitals:   09/28/18 0406 09/28/18 0748  BP: 138/74 (!) 154/83  Pulse: 88 (!) 102  Resp: 16   Temp: 36.9 C 37.7 C  SpO2: 93% 92%    Last Pain:  Vitals:   09/28/18 0748  TempSrc: Oral  PainSc:                  Durenda Hurt

## 2018-09-29 LAB — CBC
HCT: 31.8 % — ABNORMAL LOW (ref 39.0–52.0)
Hemoglobin: 10.5 g/dL — ABNORMAL LOW (ref 13.0–17.0)
MCH: 32 pg (ref 26.0–34.0)
MCHC: 33 g/dL (ref 30.0–36.0)
MCV: 97 fL (ref 80.0–100.0)
NRBC: 0 % (ref 0.0–0.2)
PLATELETS: 197 10*3/uL (ref 150–400)
RBC: 3.28 MIL/uL — AB (ref 4.22–5.81)
RDW: 11.9 % (ref 11.5–15.5)
WBC: 10.8 10*3/uL — ABNORMAL HIGH (ref 4.0–10.5)

## 2018-09-29 NOTE — Plan of Care (Signed)

## 2018-09-29 NOTE — Progress Notes (Signed)
Physical Therapy Treatment Patient Details Name: Larry Roman. MRN: [id number] DOB: [birth year] Today's Date: 09/29/2018    History of Present Illness 72 y/o male s/p R TKA 10/24, planning on L TKA in ~2 months.    PT Comments    Patient demonstrated progress in gait pattern and participation in there ex today.  He reported higher pain level (8/10) and appeared to require more effort with activity such as bed mobility and STS.  Pt was able to ambulate 100 ft with RW and attempt stair negotiation with +2 assist.  He was unable to achieve SLS with support from hand rails due to R knee pain so step training will be deferred until the afternoon.  Pt is still unable to achieve SLR or significant quadriceps activation of R LE and knee immobilizer was donned during WB activity.  PT assisted pt with there ex where needed and pt demonstrated understanding of importance of exercises and proper execution.  Pt will continue to benefit from skilled PT with focus on strength, safe functional mobility, stair negotiation and pain management.   Follow Up Recommendations  Home health PT     Equipment Recommendations  Rolling walker with 5" wheels    Recommendations for Other Services       Precautions / Restrictions Precautions Precautions: Knee;Fall Required Braces or Orthoses: Knee Immobilizer - Right Restrictions RLE Weight Bearing: Weight bearing as tolerated    Mobility  Bed Mobility Overal bed mobility: Needs Assistance Bed Mobility: Supine to Sit     Supine to sit: Min assist     General bed mobility comments: Required min A to bring LE over EOB as he is experiencing increased pain today.  Transfers Overall transfer level: Needs assistance Equipment used: Rolling walker (2 wheeled) Transfers: Sit to/from Stand Sit to Stand: Min assist;From elevated surface         General transfer comment: Required increased time to stand from bedside with appearance of increased  effort.  Ambulation/Gait Ambulation/Gait assistance: Min guard Gait Distance (Feet): 100 Feet Assistive device: Rolling walker (2 wheeled)     Gait velocity interpretation: <1.8 ft/sec, indicate of risk for recurrent falls General Gait Details: Progressed from step to gait to step through gait with greater distance and stated that his knee felt better the "more I walk".  Good use of RW.   Stairs             Wheelchair Mobility    Modified Rankin (Stroke Patients Only)       Balance Overall balance assessment: Modified Independent                                          Cognition Arousal/Alertness: Awake/alert Behavior During Therapy: WFL for tasks assessed/performed Overall Cognitive Status: Within Functional Limits for tasks assessed                                        Exercises Total Joint Exercises Quad Sets: Strengthening;15 reps;Right;Supine Short Arc Quad: 10 reps;AAROM;AROM;Supine Hip ABduction/ADduction: 10 reps;Strengthening;AAROM;Right Straight Leg Raises: 10 reps;AAROM;Right;Supine Knee Flexion: 5 reps;AAROM Other Exercises Other Exercises: Education regarding car transfers.  x4 min    General Comments        Pertinent Vitals/Pain Pain Assessment: 0-10 Pain Score: 8  Pain Location: R knee  Pain Intervention(s): Monitored during session;Premedicated before session    Home Living                      Prior Function            PT Goals (current goals can now be found in the care plan section) Acute Rehab PT Goals Patient Stated Goal: To return to walking for exercise and working out at [employer]. PT Goal Formulation: With patient Time For Goal Achievement: 10/20/18 Potential to Achieve Goals: Good Progress towards PT goals: Progressing toward goals    Frequency    BID      PT Plan Current plan remains appropriate    Co-evaluation              AM-PAC PT "6 Clicks" Daily  Activity  Outcome Measure  Difficulty turning over in bed (including adjusting bedclothes, sheets and blankets)?: A Little Difficulty moving from lying on back to sitting on the side of the bed? : A Little Difficulty sitting down on and standing up from a chair with arms (e.g., wheelchair, bedside commode, etc,.)?: A Little Help needed moving to and from a bed to chair (including a wheelchair)?: A Little Help needed walking in hospital room?: A Little Help needed climbing 3-5 steps with a railing? : A Little 6 Click Score: 18    End of Session Equipment Utilized During Treatment: Gait belt;Right knee immobilizer Activity Tolerance: Patient tolerated treatment well;Patient limited by pain Patient left: with call bell/phone within reach;with bed alarm set;in chair;with chair alarm set   PT Visit Diagnosis: Muscle weakness (generalized) (M62.81);Difficulty in walking, not elsewhere classified (R26.2)     Time: 3016-0109 PT Time Calculation (min) (ACUTE ONLY): 29 min  Charges:  $Therapeutic Exercise: 8-22 mins $Therapeutic Activity: 8-22 mins                     Roxanne Gates, PT, DPT    Roxanne Gates 09/29/2018, 10:17 AM

## 2018-09-29 NOTE — Progress Notes (Signed)
  Subjective:  POD #2  Patient reports right knee pain as moderate.  Worse with PT.  No other complaints.  His wife is at the bedside.   Patient seen with his RN, Vicente Males.  Objective:   VITALS:   Vitals:   09/28/18 2336 09/29/18 0757 09/29/18 1213 09/29/18 1542  BP: (!) 147/76 137/78 (!) 146/75 132/72  Pulse: 98 92 87 98  Resp: 18     Temp: 99.1 F (37.3 C) 98.3 F (36.8 C)  99.3 F (37.4 C)  TempSrc: Oral Oral  Oral  SpO2: 91% 92%  92%  Weight:      Height:        PHYSICAL EXAM: Right lower extremity: Neurovascular intact Sensation intact distally Intact pulses distally Dorsiflexion/Plantar flexion intact Incision: dressing C/D/I Compartment soft  LABS  Results for orders placed or performed during the hospital encounter of 09/27/18 (from the past 24 hour(s))  CBC     Status: Abnormal   Collection Time: 09/29/18  5:09 AM  Result Value Ref Range   WBC 10.8 (H) 4.0 - 10.5 K/uL   RBC 3.28 (L) 4.22 - 5.81 MIL/uL   Hemoglobin 10.5 (L) 13.0 - 17.0 g/dL   HCT 31.8 (L) 39.0 - 52.0 %   MCV 97.0 80.0 - 100.0 fL   MCH 32.0 26.0 - 34.0 pg   MCHC 33.0 30.0 - 36.0 g/dL   RDW 11.9 11.5 - 15.5 %   Platelets 197 150 - 400 K/uL   nRBC 0.0 0.0 - 0.2 %    No results found.  Assessment/Plan: 2 Days Post-Op   Active Problems:   S/P TKR (total knee replacement) using cement, right  Continue PT.  Patient having difficulty climbing stairs so far and has stairs at home.   Pain still an issue.  Patient on lovenox and has TED stockings and foot pumps.  Continue polar care and knee immobilizer when in bed.  Suppository being ordered to help patient have a bowel movement.  Hemoglobin and hematocrit remain stable.  Vitals stable.    Thornton Park , MD 09/29/2018, 5:59 PM

## 2018-09-29 NOTE — Progress Notes (Signed)
Occupational Therapy Treatment Patient Details Name: Larry Roman. MRN: [id number] DOB: 1945-12-16 Today's Date: 09/29/2018    History of present illness 72 y/o male s/p R TKA 10/24, planning on L TKA in ~2 months.   OT comments  Patient treatment attempted in AM but patient had just finished with PT and was in pain. Requested for after lunch. OT came back in afternoon. Patient was willing to work with OT, but had recently taken pain meds and was lethargic. Wife present at bedside and participated in education. Patient educated on use of AE for lower body dressing. Education provided on safety when return home, with focus on bathroom safety. Patient would benefit from reinforcement of education. Wife verbalized understanding of education.   Follow Up Recommendations  No OT follow up    Equipment Recommendations       Recommendations for Other Services      Precautions / Restrictions Precautions Precautions: Knee;Fall Required Braces or Orthoses: Knee Immobilizer - Right Knee Immobilizer - Right: On when out of bed or walking Restrictions Weight Bearing Restrictions: Yes RLE Weight Bearing: Weight bearing as tolerated       Mobility Bed Mobility Overal bed mobility: Needs Assistance Bed Mobility: Sit to Supine       Sit to supine: Min assist   General bed mobility comments: educated in self assist techniques to get LE back into bed.  Transfers Overall transfer level: Needs assistance Equipment used: Rolling walker (2 wheeled) Transfers: Sit to/from Stand Sit to Stand: Min assist;Mod assist         General transfer comment: unsteady initially upon standing and needed a second attempt to stand safely.    Balance Overall balance assessment: Needs assistance Sitting-balance support: Feet supported Sitting balance-Leahy Scale: Good     Standing balance support: Bilateral upper extremity supported Standing balance-Leahy Scale: Fair Standing balance comment:  Requires +1 assist for safety                           ADL either performed or assessed with clinical judgement   ADL Overall ADL's : Needs assistance/impaired                     Lower Body Dressing: Set up;Moderate assistance                 General ADL Comments: Pt. education was provided about A/E use for LE ADLs and safety in bathroom when returns home.     Vision Baseline Vision/History: Wears glasses Wears Glasses: Reading only Patient Visual Report: No change from baseline     Perception     Praxis      Cognition Arousal/Alertness: Lethargic Behavior During Therapy: WFL for tasks assessed/performed Overall Cognitive Status: Within Functional Limits for tasks assessed                                 General Comments: Patient had received pain medication 1.5 hours prior to OT tx. Patient was lethargic and difficulty focusing. Wife present         Exercises Total Joint Exercises Ankle Circles/Pumps: AROM;10 reps Quad Sets: Strengthening;15 reps;Right;Supine Gluteal Sets: AROM;10 reps Heel Slides: AROM;AAROM;10 reps Hip ABduction/ADduction: 10 reps;Strengthening;AAROM;Right Straight Leg Raises: 10 reps;AAROM;Right;Supine Knee Flexion: 5 reps;AAROM   Shoulder Instructions       General Comments      Pertinent Vitals/ Pain  Pain Assessment: 0-10 Pain Score: 3  Pain Location: R knee Pain Descriptors / Indicators: Aching;Operative site guarding Pain Intervention(s): Monitored during session  Home Living                                          Prior Functioning/Environment              Frequency  Min 2X/week        Progress Toward Goals  OT Goals(current goals can now be found in the care plan section)  Progress towards OT goals: Progressing toward goals  Acute Rehab OT Goals Patient Stated Goal: To go home safely OT Goal Formulation: With patient/family Potential to Achieve  Goals: Good  Plan Discharge plan remains appropriate;Frequency remains appropriate    Co-evaluation                 AM-PAC PT "6 Clicks" Daily Activity     Outcome Measure                    End of Session    OT Visit Diagnosis: Muscle weakness (generalized) (M62.81);Pain Pain - Right/Left: Right Pain - part of body: Knee   Activity Tolerance Patient limited by lethargy   Patient Left in bed;with call bell/phone within reach;with bed alarm set;with family/visitor present   Nurse Communication          Time: 8016-5537 OT Time Calculation (min): 23 min  Charges: OT General Charges $OT Visit: 1 Visit OT Treatments $Self Care/Home Management : 23-37 mins  Amie [place], OTR/L   Anaissa Macfadden L 09/29/2018, 4:47 PM

## 2018-09-29 NOTE — Progress Notes (Signed)
Physical Therapy Treatment Patient Details Name: Larry Roman. MRN: [id number] DOB: [birth year] Today's Date: 09/29/2018    History of Present Illness 72 y/o male s/p R TKA 10/24, planning on L TKA in ~2 months.    PT Comments    Pt in recliner.  Pre-medicated for session.  KI donned.  Stood with min/mod a x 1 as he had an initial LOB and needed assist to steady.  Once up, he needed time to gain his balance then took several guarded steps away from chair.  As distance increased, overall gait quality improved.  He walked 120' with heavy reliance on walker and took several standing rest breaks and one break leaning on wall in hallway.  Stair training was deferred until tomorrow am.  He was putting some increased weight on RLE but did not feel as if he could go up/down at this time.  Returned to bed with min assist.  Participated in exercises as described below.  Stair training in am prior to anticipated discharge.   Follow Up Recommendations  Home health PT     Equipment Recommendations  Rolling walker with 5" wheels    Recommendations for Other Services       Precautions / Restrictions Precautions Precautions: Knee;Fall Required Braces or Orthoses: Knee Immobilizer - Right Restrictions Weight Bearing Restrictions: Yes RLE Weight Bearing: Weight bearing as tolerated    Mobility  Bed Mobility Overal bed mobility: Needs Assistance Bed Mobility: Sit to Supine       Sit to supine: Min assist   General bed mobility comments: educated in self assist techniques to get LE back into bed.  Transfers Overall transfer level: Needs assistance Equipment used: Rolling walker (2 wheeled) Transfers: Sit to/from Stand Sit to Stand: Min assist;Mod assist         General transfer comment: unsteady initially upon standing and needed a second attempt to stand safely.  Ambulation/Gait Ambulation/Gait assistance: Min assist Gait Distance (Feet): 120 Feet Assistive device: Rolling  walker (2 wheeled) Gait Pattern/deviations: Step-to pattern Gait velocity: decreased   General Gait Details: initially very guarded gait but progressed during session.  Required standing rest break leaning on wall in hallway.  Pt with heavy relaince on walker with UE fatigue.  KI donned.     Stairs             Wheelchair Mobility    Modified Rankin (Stroke Patients Only)       Balance Overall balance assessment: Needs assistance Sitting-balance support: Feet supported Sitting balance-Leahy Scale: Good     Standing balance support: Bilateral upper extremity supported Standing balance-Leahy Scale: Fair Standing balance comment: Requires +1 assist for safety                            Cognition Arousal/Alertness: Awake/alert Behavior During Therapy: WFL for tasks assessed/performed Overall Cognitive Status: Within Functional Limits for tasks assessed                                        Exercises Total Joint Exercises Ankle Circles/Pumps: AROM;10 reps Quad Sets: Strengthening;15 reps;Right;Supine Gluteal Sets: AROM;10 reps Heel Slides: AROM;AAROM;10 reps Hip ABduction/ADduction: 10 reps;Strengthening;AAROM;Right Straight Leg Raises: 10 reps;AAROM;Right;Supine Knee Flexion: 5 reps;AAROM    General Comments        Pertinent Vitals/Pain Pain Assessment: 0-10 Pain Score: 7  Pain Location: R  knee Pain Descriptors / Indicators: Aching;Grimacing;Operative site guarding Pain Intervention(s): Limited activity within patient's tolerance;Monitored during session;Premedicated before session    Home Living                      Prior Function            PT Goals (current goals can now be found in the care plan section) Progress towards PT goals: Progressing toward goals    Frequency    BID      PT Plan Current plan remains appropriate    Co-evaluation              AM-PAC PT "6 Clicks" Daily Activity   Outcome Measure  Difficulty turning over in bed (including adjusting bedclothes, sheets and blankets)?: A Little Difficulty moving from lying on back to sitting on the side of the bed? : A Little Difficulty sitting down on and standing up from a chair with arms (e.g., wheelchair, bedside commode, etc,.)?: A Little Help needed moving to and from a bed to chair (including a wheelchair)?: A Little Help needed walking in hospital room?: A Little Help needed climbing 3-5 steps with a railing? : A Little 6 Click Score: 18    End of Session Equipment Utilized During Treatment: Gait belt;Right knee immobilizer Activity Tolerance: Patient tolerated treatment well;Patient limited by pain Patient left: with call bell/phone within reach;with bed alarm set;in bed         Time: 2119-4174 PT Time Calculation (min) (ACUTE ONLY): 29 min  Charges:  $Gait Training: 8-22 mins $Therapeutic Exercise: 8-22 mins                     Chesley Noon, PTA 09/29/18, 4:09 PM'

## 2018-09-30 LAB — CBC
HCT: 31.9 % — ABNORMAL LOW (ref 39.0–52.0)
Hemoglobin: 10.5 g/dL — ABNORMAL LOW (ref 13.0–17.0)
MCH: 31.6 pg (ref 26.0–34.0)
MCHC: 32.9 g/dL (ref 30.0–36.0)
MCV: 96.1 fL (ref 80.0–100.0)
NRBC: 0 % (ref 0.0–0.2)
Platelets: 204 10*3/uL (ref 150–400)
RBC: 3.32 MIL/uL — ABNORMAL LOW (ref 4.22–5.81)
RDW: 11.9 % (ref 11.5–15.5)
WBC: 9.4 10*3/uL (ref 4.0–10.5)

## 2018-09-30 NOTE — Progress Notes (Signed)
  Subjective:  POD #3 s/p right TKA.  Patient reports right knee pain as moderate.  Had a bowel movement.    Objective:   VITALS:   Vitals:   09/29/18 1213 09/29/18 1542 09/30/18 0012 09/30/18 0836  BP: (!) 146/75 132/72 (!) 145/80 (!) 156/74  Pulse: 87 98 (!) 106 97  Resp:   20 18  Temp:  99.3 F (37.4 C) 97.8 F (36.6 C) 98.1 F (36.7 C)  TempSrc:  Oral Oral Oral  SpO2:  92% 90% 96%  Weight:      Height:        PHYSICAL EXAM: Right lower extremity: Neurovascular intact Sensation intact distally Intact pulses distally Dorsiflexion/Plantar flexion intact Incision: dressing C/D/I and no drainage No cellulitis present Compartment soft  LABS  Results for orders placed or performed during the hospital encounter of 09/27/18 (from the past 24 hour(s))  CBC     Status: Abnormal   Collection Time: 09/30/18  3:20 AM  Result Value Ref Range   WBC 9.4 4.0 - 10.5 K/uL   RBC 3.32 (L) 4.22 - 5.81 MIL/uL   Hemoglobin 10.5 (L) 13.0 - 17.0 g/dL   HCT 31.9 (L) 39.0 - 52.0 %   MCV 96.1 80.0 - 100.0 fL   MCH 31.6 26.0 - 34.0 pg   MCHC 32.9 30.0 - 36.0 g/dL   RDW 11.9 11.5 - 15.5 %   Platelets 204 150 - 400 K/uL   nRBC 0.0 0.0 - 0.2 %    No results found.  Assessment/Plan: 3 Days Post-Op   Active Problems:   S/P TKR (total knee replacement) using cement, right   Continue current pain management and physical therapy.  Vitals stable.   Hemoglobin and hematocrit stable.  Continue lovenox.   Thornton Park , MD 09/30/2018, 12:15 PM

## 2018-09-30 NOTE — Progress Notes (Signed)
Physical Therapy Treatment Patient Details Name: Larry Roman. MRN: [id number] DOB: [birth year] Today's Date: 09/30/2018    History of Present Illness 72 y/o male s/p R TKA 10/24, planning on L TKA in ~2 months.    PT Comments    Pt was seen for treatment including stairs to prep for home.  He is expecting to be released tomorrow and is very motivated to work on control of standing balance as well as sequencing stairs with KI on RLE.  Since pt is still unsteady and unable to do SLR's to dc it, he is walking in [place] with cues for managing the WB using RW.  He is smoother with gait and safer by the end of the walk given that he is following instructions quite well, but also that he is activating R quads better to stop the knee from feeling as buckled.  Continue acutely and may be fine but encourage one more trip on the stairs tomorrow.   Follow Up Recommendations  Home health PT     Equipment Recommendations  Rolling walker with 5" wheels    Recommendations for Other Services       Precautions / Restrictions Precautions Precautions: Knee;Fall Precaution Booklet Issued: Yes (comment) Required Braces or Orthoses: Knee Immobilizer - Right Knee Immobilizer - Right: On when out of bed or walking Restrictions Weight Bearing Restrictions: Yes RLE Weight Bearing: Weight bearing as tolerated    Mobility  Bed Mobility Overal bed mobility: Needs Assistance Bed Mobility: Supine to Sit;Sit to Supine     Supine to sit: Min guard Sit to supine: Min guard   General bed mobility comments: cued to get OOB and back, able to assist his RLE to avoid PT having to help  Transfers Overall transfer level: Needs assistance Equipment used: Rolling walker (2 wheeled) Transfers: Sit to/from Stand Sit to Stand: Min guard         General transfer comment: able to transition to stand on walker without LOB  Ambulation/Gait Ambulation/Gait assistance: Min guard Gait Distance (Feet): 175  Feet Assistive device: Rolling walker (2 wheeled) Gait Pattern/deviations: Step-through pattern;Step-to pattern;Shuffle;Decreased stride length;Wide base of support Gait velocity: decreased Gait velocity interpretation: <1.31 ft/sec, indicative of household ambulator General Gait Details: depends on stability of KI but is able to progress from step to gait onto step through   Stairs Stairs: Yes Stairs assistance: Min guard Stair Management: Two rails;Forwards;Step to pattern(step to due to [facility]) Number of Stairs: 4 General stair comments: able to stand and step through but is still a higher fall risk   Wheelchair Mobility    Modified Rankin (Stroke Patients Only)       Balance Overall balance assessment: Needs assistance Sitting-balance support: Feet supported Sitting balance-Leahy Scale: Good     Standing balance support: Bilateral upper extremity supported Standing balance-Leahy Scale: Fair Standing balance comment: able to control balance with walker and brace but contact is required for transition to and from stairs                            Cognition Arousal/Alertness: Awake/alert Behavior During Therapy: WFL for tasks assessed/performed Overall Cognitive Status: Within Functional Limits for tasks assessed                                        Exercises      General  Comments General comments (skin integrity, edema, etc.): Stairs with short sitting rest prior to attempting, min guard then back to his room.  Cued pattern but pt is able to do all the work to climb stairs in knee brace      Pertinent Vitals/Pain Pain Assessment: Faces Faces Pain Scale: Hurts little more Pain Location: R knee Pain Descriptors / Indicators: Aching;Operative site guarding Pain Intervention(s): Monitored during session;Limited activity within patient's tolerance;Repositioned;Ice applied    Home Living                      Prior Function             PT Goals (current goals can now be found in the care plan section) Acute Rehab PT Goals Patient Stated Goal: To go home safely Progress towards PT goals: Progressing toward goals    Frequency    BID      PT Plan Current plan remains appropriate    Co-evaluation              AM-PAC PT "6 Clicks" Daily Activity  Outcome Measure  Difficulty turning over in bed (including adjusting bedclothes, sheets and blankets)?: None Difficulty moving from lying on back to sitting on the side of the bed? : A Little Difficulty sitting down on and standing up from a chair with arms (e.g., wheelchair, bedside commode, etc,.)?: A Little Help needed moving to and from a bed to chair (including a wheelchair)?: A Little Help needed walking in hospital room?: A Little Help needed climbing 3-5 steps with a railing? : A Little 6 Click Score: 19    End of Session Equipment Utilized During Treatment: Gait belt;Right knee immobilizer Activity Tolerance: Patient tolerated treatment well Patient left: in bed;with call bell/phone within reach;with bed alarm set Nurse Communication: Mobility status PT Visit Diagnosis: Muscle weakness (generalized) (M62.81);Difficulty in walking, not elsewhere classified (R26.2)     Time: 4801-6553 PT Time Calculation (min) (ACUTE ONLY): 29 min  Charges:  $Gait Training: 8-22 mins $Therapeutic Activity: 8-22 mins                    Ramond Dial 09/30/2018, 4:51 PM   Mee Hives, PT MS Acute Rehab Dept. Number: [place] [facility] [place]

## 2018-10-01 ENCOUNTER — Other Ambulatory Visit: Payer: Self-pay | Admitting: Family

## 2018-10-01 MED ORDER — ENOXAPARIN SODIUM 40 MG/0.4ML ~~LOC~~ SOLN: 40.0000 mg | SUBCUTANEOUS | 1 refills | Status: DC

## 2018-10-01 MED ORDER — OXYCODONE HCL 5 MG PO TABS: 5.0000 mg | ORAL_TABLET | ORAL | 0 refills | Status: DC | PRN

## 2018-10-01 MED ORDER — DOCUSATE SODIUM 100 MG PO CAPS: 100.0000 mg | ORAL_CAPSULE | Freq: Two times a day (BID) | ORAL | 0 refills | Status: DC

## 2018-10-01 MED ORDER — BISACODYL 5 MG PO TBEC: 10.0000 mg | DELAYED_RELEASE_TABLET | Freq: Every day | ORAL | 0 refills | Status: DC | PRN

## 2018-10-01 NOTE — Progress Notes (Signed)
Physical Therapy Treatment Patient Details Name: Larry Roman. MRN: [id number] DOB: Dec 07, 1945 Today's Date: 10/01/2018    History of Present Illness 72 y/o male s/p R TKA 10/24, planning on L TKA in ~2 months.    PT Comments    Pt agreeable to PT; reports 4/10 pain at rest and 8 with weigh bearing. Pt has decreased R knee range this session 10-80 degrees; pt noted to be maintained in too much flexion within KI with tendency to ER R hip in supine encouraging R knee flexion. Pt/spouse educated thoroughly on importance and technique of QS and gaining full extension of R knee. Pt educated in supine and stand with QS as well as with ambulation. Continues to require work. Educated in proper positioning as well to encouraged R knee extension. Improving ambulation technique post education with slowed speed to allow for focus on technique for QS, body positioning to rolling walker and step lengths. Pt offered stair training again, as pt cannot reach B rails as educated initially. Pt verbally educated with demonstration on side step technique and pt feels confident to perform. Continue PT to progress strength, endurance, R knee range to improve all functional mobility.    Follow Up Recommendations  Home health PT     Equipment Recommendations  Rolling walker with 5" wheels    Recommendations for Other Services       Precautions / Restrictions Precautions Precautions: Knee;Fall Required Braces or Orthoses: Knee Immobilizer - Right Knee Immobilizer - Right: On when out of bed or walking Restrictions Weight Bearing Restrictions: Yes RLE Weight Bearing: Weight bearing as tolerated    Mobility  Bed Mobility Overal bed mobility: Needs Assistance Bed Mobility: Supine to Sit;Sit to Supine     Supine to sit: Supervision;HOB elevated(cues for technique without trapeze) Sit to supine: Supervision   General bed mobility comments: cues for sequence without trapeze to simulate home  environment  Transfers Overall transfer level: Needs assistance Equipment used: Rolling walker (2 wheeled) Transfers: Sit to/from Stand Sit to Stand: Min guard         General transfer comment: takes effort and several attempts to initiate; ultimately successful. Encouraged more eccentric control with descent.   Ambulation/Gait Ambulation/Gait assistance: Min guard Gait Distance (Feet): 65 Feet Assistive device: Rolling walker (2 wheeled) Gait Pattern/deviations: Step-to pattern     General Gait Details: Requires sequence instruction as well as instruction for proper body placement within rw and attention to QS with R stance phase. Increased time due to slow speed to allow for improved technique   Stairs Stairs: (Offered; pt reports comfort with performing)   Stair Management: Sideways(ed/demonstration sidestep; pt cannot reach B rails at home)       Wheelchair Mobility    Modified Rankin (Stroke Patients Only)       Balance                                            Cognition Arousal/Alertness: Awake/alert Behavior During Therapy: WFL for tasks assessed/performed Overall Cognitive Status: Within Functional Limits for tasks assessed                                        Exercises Total Joint Exercises Quad Sets: Strengthening;Both;Supine;Standing(increased time for education and practice to improve tech) Knee  Flexion: AAROM;Right;10 reps;Seated Goniometric ROM: 10 - 80(pt remaining to flexed in immobilizer)    General Comments        Pertinent Vitals/Pain Pain Assessment: 0-10 Pain Score: 8 (With wb'g; 4 at rest) Pain Location: R knee Pain Intervention(s): Limited activity within patient's tolerance;Monitored during session;Premedicated before session;Ice applied;Other (comment)(encouraged proper breathing (pt tends to hold breath))    Home Living                      Prior Function            PT Goals  (current goals can now be found in the care plan section) Progress towards PT goals: Progressing toward goals    Frequency    BID      PT Plan Current plan remains appropriate    Co-evaluation              AM-PAC PT "6 Clicks" Daily Activity  Outcome Measure  Difficulty turning over in bed (including adjusting bedclothes, sheets and blankets)?: A Little Difficulty moving from lying on back to sitting on the side of the bed? : A Little Difficulty sitting down on and standing up from a chair with arms (e.g., wheelchair, bedside commode, etc,.)?: Unable Help needed moving to and from a bed to chair (including a wheelchair)?: A Little Help needed walking in hospital room?: A Little Help needed climbing 3-5 steps with a railing? : A Little 6 Click Score: 16    End of Session Equipment Utilized During Treatment: Gait belt;Right knee immobilizer Activity Tolerance: Patient limited by fatigue;Patient tolerated treatment well Patient left: in bed;with call bell/phone within reach;with family/visitor present;Other (comment)(polar care; alarm off to allow for EOB sit; will call OOB)   PT Visit Diagnosis: Muscle weakness (generalized) (M62.81);Difficulty in walking, not elsewhere classified (R26.2)     Time: 1000-1038 PT Time Calculation (min) (ACUTE ONLY): 38 min  Charges:  $Gait Training: 8-22 mins $Therapeutic Exercise: 23-37 mins                      Larae Grooms, PTA 10/01/2018, 11:34 AM

## 2018-10-01 NOTE — Care Management Note (Signed)
Case Management Note  Patient Details  Name: Larry Roman. MRN: [id number] Date of Birth: 05-21-1946  Subjective/Objective:  Discharging today                  Action/Plan: [facility] made aware of discharge. Lovenox 40 mg # 14 no refills called to [facility] per requested of Dr. Mack Guise.    Expected Discharge Date:  10/01/18               Expected Discharge Plan:  [place]  In-House Referral:     Discharge planning Services  CM Consult  Post Acute Care Choice:  Durable Medical Equipment, Home Health Choice offered to:  Patient  DME Arranged:  Walker rolling DME Agency:  [place]:  PT [place] Agency:  [facility] (formerly [facility])  Status of Service:  Completed, signed off  If discussed at [employer] of Stay Meetings, dates discussed:    Additional Comments:  Jolly Mango, RN 10/01/2018, 12:28 PM

## 2018-10-01 NOTE — Discharge Summary (Signed)
Physician Discharge Summary  Patient ID: Larry Roman. MRN: [id number] DOB/AGE: [birth year] 71 y.o.  Admit date: 09/27/2018 Discharge date: 10/01/2018  Admission Diagnoses:  OSTEOARTHRITIS OF RIGHT KNEE <principal problem not specified>  Discharge Diagnoses:  OSTEOARTHRITIS OF RIGHT KNEE Active Problems:   S/P TKR (total knee replacement) using cement, right   Past Medical History:  Diagnosis Date  . Depression   . DM (diabetes mellitus) ([place])   . Extrinsic asthma, unspecified    childhood  . Hives of unknown origin   . HTN (hypertension)   . Other allergy, other than to medicinal agents   . RBBB   . Sleep apnea    25 years ago mild lost weight no longer uses cpap  . Wound of right leg 08/2018   area just below knee size of quarter, reddened around edges    Surgeries: Procedure(s): RIGHT TOTAL KNEE ARTHROPLASTY on 09/27/2018   Consultants (if any):   Discharged Condition: Improved  Hospital Course: Larry Bob. is an 72 y.o. male who was admitted 09/27/2018 with a diagnosis of  OSTEOARTHRITIS OF RIGHT KNEE <principal problem not specified> and went to the operating room on 09/27/2018 and underwent an uncomplicated right total knee arthroplasty.    He was given perioperative antibiotics:  Anti-infectives (From admission, onward)   Start     Dose/Rate Route Frequency Ordered Stop   10/01/18 1000  fluconazole (DIFLUCAN) tablet 200 mg     200 mg Oral Every Mon 09/27/18 1247     09/27/18 1700  clindamycin (CLEOCIN) IVPB 600 mg     600 mg 100 mL/hr over 30 Minutes Intravenous Every 8 hours 09/27/18 1247 09/28/18 0113   09/27/18 1430  ceFAZolin (ANCEF) IVPB 1 g/50 mL premix     1 g 100 mL/hr over 30 Minutes Intravenous Every 6 hours 09/27/18 1247 09/27/18 2133   09/27/18 0652  clindamycin (CLEOCIN) 600 MG/50ML IVPB    Note to Pharmacy:  Larry Roman   : cabinet override      09/27/18 0652 09/27/18 0844   09/27/18 0615  clindamycin (CLEOCIN) IVPB 600 mg      600 mg 100 mL/hr over 30 Minutes Intravenous  Once 09/27/18 0602 09/27/18 0849   09/27/18 0600  ceFAZolin (ANCEF) IVPB 2g/100 mL premix     2 g 200 mL/hr over 30 Minutes Intravenous On call to O.R. 09/26/18 2314 09/27/18 0830   09/27/18 0556  ceFAZolin (ANCEF) 2-4 GM/100ML-% IVPB    Note to Pharmacy:  Larry Roman   : cabinet override      09/27/18 0556 09/27/18 0825    .  He was given sequential compression devices, early ambulation, and lovenox for DVT prophylaxis.  Had stable hemoglobin and hematocrit throughout his hospitalization.  His Foley catheter was removed on postop day 1.  Patient began physical therapy and postop day #1 and made appropriate progress while inpatient.  Patient had a bowel movement on postop day 3.  Given his clinical improvement he was prepared for discharge home on postop day #4.  He benefited maximally from the hospital stay and there were no complications.    Recent vital signs:  Vitals:   10/01/18 1000 10/01/18 1150  BP:  135/74  Pulse: (!) 108 (!) 101  Resp:    Temp:  98.4 F (36.9 C)  SpO2: 96% 98%    Recent laboratory studies:  Lab Results  Component Value Date   HGB 10.5 (L) 09/30/2018   HGB 10.5 (L) 09/29/2018  HGB 10.8 (L) 09/28/2018   Lab Results  Component Value Date   WBC 9.4 09/30/2018   PLT 204 09/30/2018   Lab Results  Component Value Date   INR 0.93 08/09/2018   Lab Results  Component Value Date   NA 139 09/28/2018   K 3.8 09/28/2018   CL 101 09/28/2018   CO2 30 09/28/2018   BUN 21 09/28/2018   CREATININE 0.92 09/28/2018   GLUCOSE 125 (H) 09/28/2018    Discharge Medications:   Allergies as of 10/01/2018      Reactions   Ibuprofen Other (See Comments)   Ibuprofen and motrin causes bp to elevate      Medication List    TAKE these medications   ACCU-CHEK SOFTCLIX LANCETS lancets Use up to 4 times daily to check blood sugar. Diagnosis E11.9   acetaminophen 650 MG CR tablet Commonly known as:   TYLENOL Take 1,300 mg by mouth daily.   amLODipine 10 MG tablet Commonly known as:  NORVASC Take 1 tablet (10 mg total) by mouth daily with lunch.   aspirin EC 81 MG tablet Take 81 mg by mouth daily.   azelastine 0.1 % nasal spray Commonly known as:  ASTELIN USE 1 SPRAY IN EACH NOSTRIL TWICE DAILY AS DIRECTED What changed:  See the new instructions.   B-12 5000 MCG Subl Place 5,000 mcg under the tongue daily.   bisacodyl 5 MG EC tablet Commonly known as:  DULCOLAX Take 2 tablets (10 mg total) by mouth daily as needed for moderate constipation.   docusate sodium 100 MG capsule Commonly known as:  COLACE Take 1 capsule (100 mg total) by mouth 2 (two) times daily.   DULoxetine 30 MG capsule Commonly known as:  CYMBALTA TAKE 1 CAPSULE BY MOUTH EVERY DAY What changed:  how much to take   enoxaparin 40 MG/0.4ML injection Commonly known as:  LOVENOX Inject 0.4 mLs (40 mg total) into the skin daily. Start taking on:  10/02/2018   fluconazole 200 MG tablet Commonly known as:  DIFLUCAN Take 200 mg by mouth every Monday.   hydrochlorothiazide 12.5 MG capsule Commonly known as:  MICROZIDE TAKE 1 CAPSULE BY MOUTH EVERY DAY What changed:  how much to take   loratadine 10 MG tablet Commonly known as:  CLARITIN Take 10 mg by mouth daily.   losartan 100 MG tablet Commonly known as:  COZAAR TAKE 1 TABLET BY MOUTH EVERY DAY What changed:    how much to take  how to take this  when to take this   metFORMIN 500 MG tablet Commonly known as:  GLUCOPHAGE TAKE 1 TABLET (500 MG TOTAL) BY MOUTH 2 (TWO) TIMES DAILY WITH A MEAL. What changed:    how much to take  how to take this  when to take this  additional instructions   metoprolol succinate 25 MG 24 hr tablet Commonly known as:  TOPROL-XL TAKE 1 TABLET BY MOUTH EVERY DAY   ONETOUCH VERIO test strip Generic drug:  glucose blood TEST 3 TIMES A DAY   oxyCODONE 5 MG immediate release tablet Commonly known as:   Oxy IR/ROXICODONE Take 1 tablet (5 mg total) by mouth every 4 (four) hours as needed for moderate pain (pain score 4-6).   pravastatin 40 MG tablet Commonly known as:  PRAVACHOL TAKE 1 TABLET BY MOUTH EVERY DAY   ranitidine 75 MG tablet Commonly known as:  ZANTAC Take 75 mg by mouth daily.   tamsulosin 0.4 MG Caps capsule Commonly known  as:  FLOMAX TAKE 1 CAPSULE BY MOUTH EVERY DAY What changed:  when to take this   traZODone 50 MG tablet Commonly known as:  DESYREL TAKE 1 TABLET (50 MG TOTAL) BY MOUTH AT BEDTIME AS NEEDED FOR SLEEP.   Vitamin D3 2000 units Tabs Take 2,000 Units by mouth daily.       Diagnostic Studies: Dg Knee Right Port  Result Date: 09/27/2018 CLINICAL DATA:  Status post right knee replacement EXAM: PORTABLE RIGHT KNEE - 1-2 VIEW COMPARISON:  None. FINDINGS: Right knee prosthesis is noted in satisfactory position. No acute bony or soft tissue abnormality is noted. IMPRESSION: Status post right knee replacement Electronically Signed   By: Inez Catalina M.D.   On: 09/27/2018 12:44    Disposition: Discharge disposition: 01-Home or Self Care       Discharge Instructions    Call MD / Call 911   Complete by:  As directed    If you experience chest pain or shortness of breath, CALL 911 and be transported to the hospital emergency room.  If you develope a fever above 101 F, pus (white drainage) or increased drainage or redness at the wound, or calf pain, call your surgeon's office.   Constipation Prevention   Complete by:  As directed    Drink plenty of fluids.  Prune juice may be helpful.  You may use a stool softener, such as Colace (over the counter) 100 mg twice a day.  Use MiraLax (over the counter) for constipation as needed.   Diet - low sodium heart healthy   Complete by:  As directed    Discharge instructions   Complete by:  As directed    Continue Weight bearing as tolerated on the right lower extremity. Continue to use TED stockings until  follow-up. Patient may remove them at night for sleep. Elevate the right lower extremity whenever possible. Continue to use knee immobilizer at night or when lying in bed or when elevating the operative leg. The patient may remove the knee immobilizer to perform exercises or sit in a chair. Continue using the Polar Care for comfort. Keep incision clean and dry. Cover the right knee incision during showers with a plastic bag or Saran wrap. Take lovenox 40 mg injection a day for blood clot prevention until follow up in the office. Continue to work on knee range of motion exercises at home as instructed by physical therapy. Continue to use a walker for assistance with ambulation until follow-up.   Increase activity slowly as tolerated   Complete by:  As directed          Signed: Thornton Park ,MD 10/01/2018, 12:24 PM

## 2018-10-01 NOTE — Plan of Care (Signed)

## 2018-10-01 NOTE — Care Management Important Message (Signed)
Important Message  Patient Details  Name: Larry Roman. MRN: [id number] Date of Birth: January 25, 1946   Medicare Important Message Given:  Yes    Juliann Pulse A Mina Carlisi 10/01/2018, 11:25 AM

## 2018-10-01 NOTE — Progress Notes (Signed)
  Subjective:  POD#4 s/p right TKA.  Patient reports right knee pain as mild to moderate.  Patient has had a BM.  Progressing with PT.  Objective:   VITALS:   Vitals:   09/30/18 2322 10/01/18 0738 10/01/18 1000 10/01/18 1150  BP: 140/76 (!) 158/83  135/74  Pulse: (!) 113 (!) 113 (!) 108 (!) 101  Resp: 16     Temp: 99.2 F (37.3 C) 98.2 F (36.8 C)  98.4 F (36.9 C)  TempSrc: Oral Oral  Oral  SpO2: 93% 97% 96% 98%  Weight:      Height:        PHYSICAL EXAM: Right lower extremity: Neurovascular intact Sensation intact distally Intact pulses distally Dorsiflexion/Plantar flexion intact Incision: dressing C/D/I No cellulitis present Compartment soft  LABS  No results found for this or any previous visit (from the past 24 hour(s)).  No results found.  Assessment/Plan: 4 Days Post-Op   Active Problems:   S/P TKR (total knee replacement) using cement, right   Patient is improving postop.  His pain is improved.  He is making good progress of physical therapy.  Patient is ready for discharge home.  He will remain on Lovenox 40 mg daily x2 weeks and then switch over to aspirin.  Follow-up with me in the office in 10 to 14 days.  Patient is recommended for home health PT which I will order.   Thornton Park , MD 10/01/2018, 12:16 PM

## 2018-10-02 ENCOUNTER — Encounter
Admission: RE | Admit: 2018-10-02 | Discharge: 2018-10-02 | Disposition: A | Discharge status: Home / Self Care | Payer: PPO | Source: Ambulatory Visit | Attending: Internal Medicine | Admitting: Internal Medicine

## 2018-10-02 ENCOUNTER — Emergency Department: Payer: PPO

## 2018-10-02 ENCOUNTER — Emergency Department
Admission: EM | Admit: 2018-10-02 | Discharge: 2018-10-02 | Disposition: A | Discharge status: Home / Self Care | Payer: PPO | Attending: Emergency Medicine | Admitting: Emergency Medicine

## 2018-10-02 ENCOUNTER — Encounter: Payer: Self-pay | Admitting: Emergency Medicine

## 2018-10-02 ENCOUNTER — Other Ambulatory Visit: Payer: Self-pay

## 2018-10-02 DIAGNOSIS — M6281 Muscle weakness (generalized): Secondary | ICD-10-CM | POA: Diagnosis not present

## 2018-10-02 DIAGNOSIS — Z471 Aftercare following joint replacement surgery: Secondary | ICD-10-CM | POA: Diagnosis not present

## 2018-10-02 DIAGNOSIS — Y999 Unspecified external cause status: Secondary | ICD-10-CM | POA: Diagnosis not present

## 2018-10-02 DIAGNOSIS — G4733 Obstructive sleep apnea (adult) (pediatric): Secondary | ICD-10-CM | POA: Diagnosis not present

## 2018-10-02 DIAGNOSIS — I4510 Unspecified right bundle-branch block: Secondary | ICD-10-CM | POA: Diagnosis not present

## 2018-10-02 DIAGNOSIS — Z79899 Other long term (current) drug therapy: Secondary | ICD-10-CM | POA: Insufficient documentation

## 2018-10-02 DIAGNOSIS — Z96651 Presence of right artificial knee joint: Secondary | ICD-10-CM | POA: Insufficient documentation

## 2018-10-02 DIAGNOSIS — M10472 Other secondary gout, left ankle and foot: Secondary | ICD-10-CM | POA: Diagnosis not present

## 2018-10-02 DIAGNOSIS — R600 Localized edema: Secondary | ICD-10-CM | POA: Diagnosis not present

## 2018-10-02 DIAGNOSIS — R2241 Localized swelling, mass and lump, right lower limb: Secondary | ICD-10-CM | POA: Insufficient documentation

## 2018-10-02 DIAGNOSIS — Z7401 Bed confinement status: Secondary | ICD-10-CM | POA: Diagnosis not present

## 2018-10-02 DIAGNOSIS — M1711 Unilateral primary osteoarthritis, right knee: Secondary | ICD-10-CM | POA: Diagnosis not present

## 2018-10-02 DIAGNOSIS — M79604 Pain in right leg: Secondary | ICD-10-CM

## 2018-10-02 DIAGNOSIS — S3992XA Unspecified injury of lower back, initial encounter: Secondary | ICD-10-CM | POA: Diagnosis present

## 2018-10-02 DIAGNOSIS — M79672 Pain in left foot: Secondary | ICD-10-CM | POA: Diagnosis not present

## 2018-10-02 DIAGNOSIS — N400 Enlarged prostate without lower urinary tract symptoms: Secondary | ICD-10-CM | POA: Diagnosis not present

## 2018-10-02 DIAGNOSIS — Y939 Activity, unspecified: Secondary | ICD-10-CM | POA: Diagnosis not present

## 2018-10-02 DIAGNOSIS — S39012A Strain of muscle, fascia and tendon of lower back, initial encounter: Secondary | ICD-10-CM | POA: Diagnosis not present

## 2018-10-02 DIAGNOSIS — Z7901 Long term (current) use of anticoagulants: Secondary | ICD-10-CM | POA: Insufficient documentation

## 2018-10-02 DIAGNOSIS — E119 Type 2 diabetes mellitus without complications: Secondary | ICD-10-CM | POA: Insufficient documentation

## 2018-10-02 DIAGNOSIS — Z7984 Long term (current) use of oral hypoglycemic drugs: Secondary | ICD-10-CM | POA: Diagnosis not present

## 2018-10-02 DIAGNOSIS — Y929 Unspecified place or not applicable: Secondary | ICD-10-CM | POA: Diagnosis not present

## 2018-10-02 DIAGNOSIS — Z7982 Long term (current) use of aspirin: Secondary | ICD-10-CM | POA: Insufficient documentation

## 2018-10-02 DIAGNOSIS — I10 Essential (primary) hypertension: Secondary | ICD-10-CM | POA: Insufficient documentation

## 2018-10-02 DIAGNOSIS — M546 Pain in thoracic spine: Secondary | ICD-10-CM | POA: Diagnosis not present

## 2018-10-02 DIAGNOSIS — M545 Low back pain: Secondary | ICD-10-CM | POA: Diagnosis not present

## 2018-10-02 DIAGNOSIS — X501XXA Overexertion from prolonged static or awkward postures, initial encounter: Secondary | ICD-10-CM | POA: Insufficient documentation

## 2018-10-02 DIAGNOSIS — F339 Major depressive disorder, recurrent, unspecified: Secondary | ICD-10-CM | POA: Diagnosis not present

## 2018-10-02 DIAGNOSIS — Z9889 Other specified postprocedural states: Secondary | ICD-10-CM

## 2018-10-02 DIAGNOSIS — J45909 Unspecified asthma, uncomplicated: Secondary | ICD-10-CM | POA: Diagnosis not present

## 2018-10-02 DIAGNOSIS — R2689 Other abnormalities of gait and mobility: Secondary | ICD-10-CM | POA: Diagnosis not present

## 2018-10-02 DIAGNOSIS — Z96659 Presence of unspecified artificial knee joint: Secondary | ICD-10-CM | POA: Diagnosis not present

## 2018-10-02 LAB — CBC
HEMATOCRIT: 30.6 % — AB (ref 39.0–52.0)
HEMOGLOBIN: 10.2 g/dL — AB (ref 13.0–17.0)
MCH: 31 pg (ref 26.0–34.0)
MCHC: 33.3 g/dL (ref 30.0–36.0)
MCV: 93 fL (ref 80.0–100.0)
Platelets: 302 10*3/uL (ref 150–400)
RBC: 3.29 MIL/uL — ABNORMAL LOW (ref 4.22–5.81)
RDW: 11.8 % (ref 11.5–15.5)
WBC: 8.4 10*3/uL (ref 4.0–10.5)
nRBC: 0 % (ref 0.0–0.2)

## 2018-10-02 LAB — BASIC METABOLIC PANEL
Anion gap: 13 (ref 5–15)
BUN: 14 mg/dL (ref 8–23)
CO2: 33 mmol/L — AB (ref 22–32)
CREATININE: 0.68 mg/dL (ref 0.61–1.24)
Calcium: 8.6 mg/dL — ABNORMAL LOW (ref 8.9–10.3)
Chloride: 90 mmol/L — ABNORMAL LOW (ref 98–111)
GFR calc Af Amer: >60 mL/min (ref >60)
GFR calc non Af Amer: >60 mL/min (ref >60)
GLUCOSE: 144 mg/dL — AB (ref 70–99)
Potassium: 3 mmol/L — ABNORMAL LOW (ref 3.5–5.1)
Sodium: 136 mmol/L (ref 135–145)

## 2018-10-02 LAB — TROPONIN I: Troponin I: <0.03 ng/mL (ref <0.03)

## 2018-10-02 MED ORDER — OXYCODONE HCL 5 MG PO TABS: 5.0000 mg | ORAL_TABLET | Freq: Three times a day (TID) | ORAL | 0 refills | Status: DC | PRN

## 2018-10-02 MED ORDER — HYDROMORPHONE HCL 1 MG/ML IJ SOLN
0.5000 mg | Freq: Once | INTRAMUSCULAR | Status: AC
  Administered 2018-10-02: 0.5 mg via INTRAMUSCULAR
  Filled 2018-10-02: qty 1

## 2018-10-02 MED ORDER — HYDROMORPHONE HCL 1 MG/ML IJ SOLN
0.5000 mg | Freq: Once | INTRAMUSCULAR | Status: AC
  Administered 2018-10-02: 0.5 mg via INTRAVENOUS
  Filled 2018-10-02: qty 1

## 2018-10-02 NOTE — Consult Note (Signed)
ORTHOPAEDIC CONSULTATION  REQUESTING PHYSICIAN: Delman Kitten, MD  Chief Complaint: Pain and weakness in the lower extremities.  HPI: Larry Roman. is a 72 y.o. male who returned to the ER via EMS today.  Patient states he is having increased pain in his lower extremities along with increased weakness.  He has difficulty standing or ambulating with his walker at home.  Patient also had pain in his low back.  His wife is with him in the ER and states that he was unable to move his legs this morning.  He also explains that any attempt to touch or move his leg because the patient to scream.  She called EMS to bring him to the hospital.  Past Medical History:  Diagnosis Date  . Depression   . DM (diabetes mellitus) ([place])   . Extrinsic asthma, unspecified    childhood  . Hives of unknown origin   . HTN (hypertension)   . Other allergy, other than to medicinal agents   . RBBB   . Sleep apnea    25 years ago mild lost weight no longer uses cpap  . Wound of right leg 08/2018   area just below knee size of quarter, reddened around edges   Past Surgical History:  Procedure Laterality Date  . BACK SURGERY  1995   ruptured disc encapsulated nerves  . CATARACT EXTRACTION, BILATERAL  2016   Dr. Kerman Passey at [facility]  . EYE SURGERY Bilateral    cataract extractions  . TONSILLECTOMY    . TOTAL KNEE ARTHROPLASTY Right 09/27/2018   Procedure: TOTAL KNEE ARTHROPLASTY;  Surgeon: Thornton Park, MD;  Location: [facility] ORS;  Service: Orthopedics;  Laterality: Right;   Social History   Socioeconomic History  . Marital status: Married    Spouse name: Pam  . Number of children: 2  . Years of education: Not on file  . Highest education level: Not on file  Occupational History    Employer: [employer]    Comment: retired  Scientific laboratory technician  . Financial resource strain: Not hard at all  . Food insecurity:    Worry: Never true    Inability: Never true  . Transportation needs:     Medical: No    Non-medical: No  Tobacco Use  . Smoking status: Never Smoker  . Smokeless tobacco: Never Used  . Tobacco comment: tobacco use - no  Substance and Sexual Activity  . Alcohol use: Yes    Alcohol/week: 0.0 standard drinks    Comment: rare  . Drug use: No  . Sexual activity: Yes  Lifestyle  . Physical activity:    Days per week: Not on file    Minutes per session: Not on file  . Stress: Not at all  Relationships  . Social connections:    Talks on phone: Not on file    Gets together: Not on file    Attends religious service: Not on file    Active member of club or organization: Not on file    Attends meetings of clubs or organizations: Not on file    Relationship status: Not on file  Other Topics Concern  . Not on file  Social History Narrative  . Not on file   Family History  Problem Relation Age of Onset  . Heart failure Father   . Heart attack Mother    Allergies  Allergen Reactions  . Ibuprofen Other (See Comments)    Ibuprofen and motrin causes bp to  elevate   Prior to Admission medications   Medication Sig Start Date End Date Taking? Authorizing Provider  ACCU-CHEK SOFTCLIX LANCETS lancets Use up to 4 times daily to check blood sugar. Diagnosis E11.9 10/22/16   Coral Spikes, DO  acetaminophen (TYLENOL) 650 MG CR tablet Take 1,300 mg by mouth daily.    [provider]  amLODipine (NORVASC) 10 MG tablet Take 1 tablet (10 mg total) by mouth daily with lunch. 09/24/18   Burnard Hawthorne, FNP  aspirin EC 81 MG tablet Take 81 mg by mouth daily.      [provider]  azelastine (ASTELIN) 0.1 % nasal spray USE 1 SPRAY IN EACH NOSTRIL TWICE DAILY AS DIRECTED Patient taking differently: Place 1 spray into both nostrils 2 (two) times daily as needed for rhinitis or allergies.  04/17/18   Burnard Hawthorne, FNP  bisacodyl (DULCOLAX) 5 MG EC tablet Take 2 tablets (10 mg total) by mouth daily as needed for moderate constipation. 10/01/18    Thornton Park, MD  Cholecalciferol (VITAMIN D3) 2000 UNITS TABS Take 2,000 Units by mouth daily.     [provider]  Cyanocobalamin (B-12) 5000 MCG SUBL Place 5,000 mcg under the tongue daily.     [provider]  docusate sodium (COLACE) 100 MG capsule Take 1 capsule (100 mg total) by mouth 2 (two) times daily. 10/01/18   Thornton Park, MD  DULoxetine (CYMBALTA) 30 MG capsule TAKE 1 CAPSULE BY MOUTH EVERY DAY Patient taking differently: Take 30 mg by mouth daily.  04/12/18   Burnard Hawthorne, FNP  enoxaparin (LOVENOX) 40 MG/0.4ML injection Inject 0.4 mLs (40 mg total) into the skin daily. 10/02/18   Thornton Park, MD  fluconazole (DIFLUCAN) 200 MG tablet Take 200 mg by mouth every Monday.  12/15/17   [provider]  hydrochlorothiazide (MICROZIDE) 12.5 MG capsule TAKE 1 CAPSULE BY MOUTH EVERY DAY Patient taking differently: Take 12.5 mg by mouth daily.  07/09/18   Burnard Hawthorne, FNP  loratadine (CLARITIN) 10 MG tablet Take 10 mg by mouth daily.    [provider]  losartan (COZAAR) 100 MG tablet TAKE 1 TABLET BY MOUTH EVERY DAY Patient taking differently: Take 100 mg by mouth daily. TAKE 1 TABLET BY MOUTH EVERY DAY 05/21/18   Burnard Hawthorne, FNP  metFORMIN (GLUCOPHAGE) 500 MG tablet TAKE 1 TABLET (500 MG TOTAL) BY MOUTH 2 (TWO) TIMES DAILY WITH A MEAL. Patient taking differently: Take 500 mg by mouth 2 (two) times daily.  04/20/18   Burnard Hawthorne, FNP  metoprolol succinate (TOPROL-XL) 25 MG 24 hr tablet TAKE 1 TABLET BY MOUTH EVERY DAY 07/26/18   Burnard Hawthorne, FNP  [facility] VERIO test strip TEST 3 TIMES A DAY 10/01/18   Burnard Hawthorne, FNP  oxyCODONE (OXY IR/ROXICODONE) 5 MG immediate release tablet Take 1 tablet (5 mg total) by mouth every 4 (four) hours as needed for moderate pain (pain score 4-6). 10/01/18   Thornton Park, MD  pravastatin (PRAVACHOL) 40 MG tablet TAKE 1 TABLET BY MOUTH EVERY DAY 02/26/18   Burnard Hawthorne,  FNP  ranitidine (ZANTAC) 75 MG tablet Take 75 mg by mouth daily.     [provider]  tamsulosin (FLOMAX) 0.4 MG CAPS capsule TAKE 1 CAPSULE BY MOUTH EVERY DAY Patient taking differently: Take 0.4 mg by mouth daily with lunch.  06/19/18   Burnard Hawthorne, FNP  traZODone (DESYREL) 50 MG tablet TAKE 1 TABLET (50 MG TOTAL) BY  MOUTH AT BEDTIME AS NEEDED FOR SLEEP. 07/09/18   Burnard Hawthorne, FNP   Dg Lumbar Spine Complete  Result Date: 10/02/2018 CLINICAL DATA:  Low back and bilateral leg pain since right-sided knee surgery 5 days ago. Previous history of lumbar spine surgery approximately 24 years ago. History of diabetes. EXAM: LUMBAR SPINE - COMPLETE 4+ VIEW COMPARISON:  MRI of the lumbar spine of June 17, 2016 and coronal and sagittal reconstructed images through the lumbar spine from an abdominal and pelvic CT scan of December 28, 2009 FINDINGS: The lumbar vertebral bodies are preserved in height. There is multilevel moderate degenerative disc space narrowing which is not a new finding. There is grade 1 anterolisthesis of L3 with respect L2 and L4 with respect L3 which appears stable. There are prominent anterior and lateral endplate spurs at all lumbar levels. There is facet joint hypertrophy at L5-S1. There is a coarse calcification which projects in the right renal pelvis measuring approximately 1.3 cm in greatest dimension. There is an approximately 5 mm diameter lower pole left kidney stone. IMPRESSION: Multilevel degenerative disc disease. No compression fracture. Minimal grade 1 anterolisthesis of L3 and L4. Probable large stone in the left renal pelvis with smaller stone in the lower pole of the left kidney. Electronically Signed   By: David  Martinique M.D.   On: 10/02/2018 13:57    Positive ROS: All other systems have been reviewed and were otherwise negative with the exception of those mentioned in the HPI and as above.  Physical Exam: General: Awake, responsive.   Patient appears  fatigued.  MUSCULOSKELETAL: Right lower extremity: Patient's finger remains clean dry and intact.  His leg compartments are soft and compressible.  He can dorsiflex and plantarflex his ankle and flex and extend his toes.  He has intact sensation light touch in both feet and palpable pedal pulses.  Assessment: Pain and weakness with failure to thrive at home  Plan: I saw the patient in the emergency room with his wife at the bedside.  Total therapy has evaluated the patient and is recommended skilled nursing facility.  We are awaiting insurance approval for him to go to [facility] hopefully tonight.  Patient is being sent to ultrasound to rule out a DVT given his tenderness in the right lower extremity.  His compartments are soft however there is no evidence of compartment syndrome.  I have ordered a CBC, BMP and Troponin given his fatigue.  Patient does not have any evidence of radicular symptoms at this time.  His motor and sensory function are intact distally in his lower extremities.  Patient's back pain is under control currently.  Patient would benefit from skilled nursing facility given his failure to thrive at home.  He is weightbearing as tolerated on the right lower extremity and should use a walker for assistance with ambulation.  He should use a knee immobilizer at night to help with knee extension.  Should continue Lovenox 40 mg subcu daily for DVT prophylaxis.  The patient will follow-up with me in approximately 7 to 10 days in my office for wound check and reevaluation.    Thornton Park, MD    10/02/2018 5:03 PM

## 2018-10-02 NOTE — ED Notes (Addendum)
Spoke with Larry Roman in  PT at [phone] who states she will look at the order and get back with me shortly.

## 2018-10-02 NOTE — Evaluation (Signed)
Physical Therapy Evaluation Patient Details Name: Larry Roman. MRN: [id number] DOB: 1946/10/14 Today's Date: 10/02/2018   History of Present Illness  72 y/o male s/p R TKA 10/24, he had relatively normal recovery but did have some issues with ROM/stiffness.  He was planning on having L TKA in the near future as it was "worse than the R."  Pt went home 10/28 struggled excessively to do any mobility, apparently twisted and hurt his back and simply could not manage at home.    Clinical Impression  Pt returns to [facility] the day after d/c s/p R TKA.  He had been able to manage steps and walk nearly 200 ft just 2 days ago, but has progressively struggled to even get to standing.  He had a lot of issues with getting to standing and c/o severe pain during PT exam in the ED.  He is completely unsafe to go home and will require short term rehab to work on the below listed functional limitations.      Follow Up Recommendations SNF    Equipment Recommendations       Recommendations for Other Services       Precautions / Restrictions Precautions Precautions: Knee;Fall Required Braces or Orthoses: (pt did not have KI with him from home) Restrictions RLE Weight Bearing: Weight bearing as tolerated      Mobility  Bed Mobility Overal bed mobility: Needs Assistance Bed Mobility: Supine to Sit;Sit to Supine     Supine to sit: Mod assist Sit to supine: Min assist   General bed mobility comments: Pt calling out in pain on first attempt to get to sitting with assist, after rest we were able to get to long sitting with heavy assist and then PT also needed to guide LEs off EOB secondary to pain  Transfers Overall transfer level: Needs assistance Equipment used: Rolling walker (2 wheeled) Transfers: Sit to/from Stand Sit to Stand: Mod assist         General transfer comment: Pt unable to get to fully upright, highly reliant on the walker with inability to get either knee extended.  Poor  tolerance and calling out in pain t/o the effort.   Ambulation/Gait             General Gait Details: Pt unable to attain full upright, could not unweight either LE at EOB.  Stairs            Wheelchair Mobility    Modified Rankin (Stroke Patients Only)       Balance Overall balance assessment: Needs assistance   Sitting balance-Leahy Scale: Fair Sitting balance - Comments: pain limited in sitting     Standing balance-Leahy Scale: Zero Standing balance comment: poor tolerance, unable to achieve full upright                             Pertinent Vitals/Pain Pain Score: 9  Pain Location: R knee, R ankle/calf, low back    Home Living Family/patient expects to be discharged to:: Skilled nursing facility                      Prior Function Level of Independence: Independent         Comments: Pt has struggled with R knee pain/stiffness since sx, has been unable to stand the last 24 hours     Hand Dominance        Extremity/Trunk Assessment   Upper  Extremity Assessment Upper Extremity Assessment: Generalized weakness    Lower Extremity Assessment Lower Extremity Assessment: (extremely pain limited with all LE activity, no SLR on R)       Communication   Communication: No difficulties  Cognition Arousal/Alertness: Awake/alert Behavior During Therapy: WFL for tasks assessed/performed Overall Cognitive Status: Within Functional Limits for tasks assessed                                        General Comments      Exercises     Assessment/Plan    PT Assessment Patient needs continued PT services  PT Problem List Decreased strength;Decreased range of motion;Decreased activity tolerance;Decreased balance;Decreased mobility;Decreased coordination;Decreased knowledge of use of DME;Decreased safety awareness;Pain       PT Treatment Interventions DME instruction;Gait training;Stair training;Functional mobility  training;Therapeutic activities;Therapeutic exercise;Balance training;Neuromuscular re-education;Patient/family education    PT Goals (Current goals can be found in the Care Plan section)  Acute Rehab PT Goals Patient Stated Goal: get moving better PT Goal Formulation: With patient Time For Goal Achievement: 10/16/18 Potential to Achieve Goals: Fair    Frequency BID   Barriers to discharge        Co-evaluation               AM-PAC PT "6 Clicks" Daily Activity  Outcome Measure Difficulty turning over in bed (including adjusting bedclothes, sheets and blankets)?: Unable Difficulty moving from lying on back to sitting on the side of the bed? : Unable Difficulty sitting down on and standing up from a chair with arms (e.g., wheelchair, bedside commode, etc,.)?: Unable Help needed moving to and from a bed to chair (including a wheelchair)?: Total Help needed walking in hospital room?: Total Help needed climbing 3-5 steps with a railing? : Total 6 Click Score: 6    End of Session Equipment Utilized During Treatment: Gait belt Activity Tolerance: Patient limited by pain Patient left: in bed;with call bell/phone within reach;with family/visitor present Nurse Communication: Mobility status PT Visit Diagnosis: Muscle weakness (generalized) (M62.81);Difficulty in walking, not elsewhere classified (R26.2);Pain Pain - Right/Left: Right Pain - part of body: Leg    Time: 1224-4975 PT Time Calculation (min) (ACUTE ONLY): 20 min   Charges:   PT Evaluation $PT Eval Low Complexity: 1 Low          Kreg Shropshire, DPT 10/02/2018, 5:05 PM

## 2018-10-02 NOTE — Care Management (Addendum)
Message sent to Dr. Mack Guise, [place], and home health rep Helene Kelp to see if we can assist patient needs from ED. PT evaluation requested. RNCM spoke with Crystal with [employer] [phone] and she will follow PT recommendation.

## 2018-10-02 NOTE — ED Notes (Signed)
Patient using the urinal at this time.

## 2018-10-02 NOTE — Care Management (Signed)
PT recommends rehab at discharge. CSW waiting on authorization from [facility] for [facility].

## 2018-10-02 NOTE — ED Notes (Signed)
Called pt wife Olin Hauser to update her that pt is leaving the department and going to [facility] via EMS. She stated she would meet pt there.

## 2018-10-02 NOTE — Progress Notes (Signed)
Clinical Education officer, museum (CSW) received consult from RN case manager that patient needs SNF placement. CSW contacted patient's insurance [facility] to start SNF authorization and ask if they can use yesterday's PT notes. CSW is waiting on call back from Health [employer]. Dr. Mack Guise is aware of above.   [employer], LCSW [phone]

## 2018-10-02 NOTE — ED Provider Notes (Signed)
[facility] Emergency Department Provider Note   ____________________________________________   First MD Initiated Contact with Patient 10/02/18 1232     (approximate)  I have reviewed the triage vital signs and the nursing notes.   HISTORY  Chief Complaint Back Pain    HPI Larry Roman. is a 72 y.o. male patient arrived via EMS complaining of low back pain.  Patient he was released from the hospital yesterday after undergoing right knee replacement.  Patient stated waking this morning and found unable to use his walker secondary to back pain.  Patient denies radicular component to his back pain.  Patient denies bladder bowel dysfunction.  Patient state pain is currently at 3/10 secondary to taking oxycodone prior to arrival.  Patient's wife state she did not want the patient to go home unless resources made available for him.  Wife states he is unable to care for the patient and secondary to his weight.  Past Medical History:  Diagnosis Date  . Depression   . DM (diabetes mellitus) ([place])   . Extrinsic asthma, unspecified    childhood  . Hives of unknown origin   . HTN (hypertension)   . Other allergy, other than to medicinal agents   . RBBB   . Sleep apnea    25 years ago mild lost weight no longer uses cpap  . Wound of right leg 08/2018   area just below knee size of quarter, reddened around edges    Patient Active Problem List   Diagnosis Date Noted  . S/P TKR (total knee replacement) using cement, right 09/27/2018  . OSA (obstructive sleep apnea) 04/16/2018  . Tremor 01/17/2018  . Acute left-sided low back pain without sciatica 11/10/2017  . Sinusitis 04/21/2017  . BPH (benign prostatic hyperplasia) 01/10/2017  . Chronic back pain 08/19/2016  . Chronic pain of both knees 09/14/2015  . Erectile dysfunction 09/10/2012  . Right bundle branch block 09/10/2012  . Hypertension 11/10/2011  . Insomnia 11/10/2011  . Diabetes mellitus type 2,  controlled ([place]) 11/10/2011  . Depression, recurrent ([place]) 05/13/2009    Past Surgical History:  Procedure Laterality Date  . BACK SURGERY  1995   ruptured disc encapsulated nerves  . CATARACT EXTRACTION, BILATERAL  2016   Dr. Kerman Passey at [facility]  . EYE SURGERY Bilateral    cataract extractions  . TONSILLECTOMY    . TOTAL KNEE ARTHROPLASTY Right 09/27/2018   Procedure: TOTAL KNEE ARTHROPLASTY;  Surgeon: Thornton Park, MD;  Location: [facility] ORS;  Service: Orthopedics;  Laterality: Right;    Prior to Admission medications   Medication Sig Start Date End Date Taking? Authorizing Provider  ACCU-CHEK SOFTCLIX LANCETS lancets Use up to 4 times daily to check blood sugar. Diagnosis E11.9 10/22/16   Coral Spikes, DO  acetaminophen (TYLENOL) 650 MG CR tablet Take 1,300 mg by mouth daily.    [provider]  amLODipine (NORVASC) 10 MG tablet Take 1 tablet (10 mg total) by mouth daily with lunch. 09/24/18   Burnard Hawthorne, FNP  aspirin EC 81 MG tablet Take 81 mg by mouth daily.      [provider]  azelastine (ASTELIN) 0.1 % nasal spray USE 1 SPRAY IN EACH NOSTRIL TWICE DAILY AS DIRECTED Patient taking differently: Place 1 spray into both nostrils 2 (two) times daily as needed for rhinitis or allergies.  04/17/18   Burnard Hawthorne, FNP  bisacodyl (DULCOLAX) 5 MG EC tablet Take 2 tablets (10 mg  total) by mouth daily as needed for moderate constipation. 10/01/18   Thornton Park, MD  Cholecalciferol (VITAMIN D3) 2000 UNITS TABS Take 2,000 Units by mouth daily.     [provider]  Cyanocobalamin (B-12) 5000 MCG SUBL Place 5,000 mcg under the tongue daily.     [provider]  docusate sodium (COLACE) 100 MG capsule Take 1 capsule (100 mg total) by mouth 2 (two) times daily. 10/01/18   Thornton Park, MD  DULoxetine (CYMBALTA) 30 MG capsule TAKE 1 CAPSULE BY MOUTH EVERY DAY Patient taking differently: Take 30 mg by mouth daily.  04/12/18    Burnard Hawthorne, FNP  enoxaparin (LOVENOX) 40 MG/0.4ML injection Inject 0.4 mLs (40 mg total) into the skin daily. 10/02/18   Thornton Park, MD  fluconazole (DIFLUCAN) 200 MG tablet Take 200 mg by mouth every Monday.  12/15/17   [provider]  hydrochlorothiazide (MICROZIDE) 12.5 MG capsule TAKE 1 CAPSULE BY MOUTH EVERY DAY Patient taking differently: Take 12.5 mg by mouth daily.  07/09/18   Burnard Hawthorne, FNP  loratadine (CLARITIN) 10 MG tablet Take 10 mg by mouth daily.    [provider]  losartan (COZAAR) 100 MG tablet TAKE 1 TABLET BY MOUTH EVERY DAY Patient taking differently: Take 100 mg by mouth daily. TAKE 1 TABLET BY MOUTH EVERY DAY 05/21/18   Burnard Hawthorne, FNP  metFORMIN (GLUCOPHAGE) 500 MG tablet TAKE 1 TABLET (500 MG TOTAL) BY MOUTH 2 (TWO) TIMES DAILY WITH A MEAL. Patient taking differently: Take 500 mg by mouth 2 (two) times daily.  04/20/18   Burnard Hawthorne, FNP  metoprolol succinate (TOPROL-XL) 25 MG 24 hr tablet TAKE 1 TABLET BY MOUTH EVERY DAY 07/26/18   Burnard Hawthorne, FNP  [facility] VERIO test strip TEST 3 TIMES A DAY 10/01/18   Burnard Hawthorne, FNP  oxyCODONE (OXY IR/ROXICODONE) 5 MG immediate release tablet Take 1 tablet (5 mg total) by mouth every 4 (four) hours as needed for moderate pain (pain score 4-6). 10/01/18   Thornton Park, MD  pravastatin (PRAVACHOL) 40 MG tablet TAKE 1 TABLET BY MOUTH EVERY DAY 02/26/18   Burnard Hawthorne, FNP  ranitidine (ZANTAC) 75 MG tablet Take 75 mg by mouth daily.     [provider]  tamsulosin (FLOMAX) 0.4 MG CAPS capsule TAKE 1 CAPSULE BY MOUTH EVERY DAY Patient taking differently: Take 0.4 mg by mouth daily with lunch.  06/19/18   Burnard Hawthorne, FNP  traZODone (DESYREL) 50 MG tablet TAKE 1 TABLET (50 MG TOTAL) BY MOUTH AT BEDTIME AS NEEDED FOR SLEEP. 07/09/18   Burnard Hawthorne, FNP    Allergies Ibuprofen  Family History  Problem Relation Age of Onset  . Heart failure  Father   . Heart attack Mother     Social History Social History   Tobacco Use  . Smoking status: Never Smoker  . Smokeless tobacco: Never Used  . Tobacco comment: tobacco use - no  Substance Use Topics  . Alcohol use: Yes    Alcohol/week: 0.0 standard drinks    Comment: rare  . Drug use: No    Review of Systems Constitutional: No fever/chills Eyes: No visual changes. ENT: No sore throat. Cardiovascular: Denies chest pain. Respiratory: Denies shortness of breath. Gastrointestinal: No abdominal pain.  No nausea, no vomiting.  No diarrhea.  No constipation. Genitourinary: Negative for dysuria. Musculoskeletal: Negative for back pain. Skin: Negative for rash. Neurological: Negative for headaches, focal weakness or numbness. Psychiatric:Depression Endocrine:Diabetes  and hypertension. Allergic/Immunilogical: Ibuprofen ____________________________________________   PHYSICAL EXAM:  VITAL SIGNS: ED Triage Vitals  Enc Vitals Group     BP      Pulse      Resp      Temp      Temp src      SpO2      Weight      Height      Head Circumference      Peak Flow      Pain Score      Pain Loc      Pain Edu?      Excl. in [place]?    Constitutional: Alert and oriented. Well appearing and in no acute distress. Hematological/Lymphatic/Immunilogical No cervical lymphadenopathy. Cardiovascular: Normal rate, regular rhythm. Grossly normal heart sounds.  Good peripheral circulation. Respiratory: Normal respiratory effort.  No retractions. Lungs CTAB. Musculoskeletal: Right leg is in a splint secondary to recent surgery.  No obvious deformity of the lumbar spine.. Neurologic:  Normal speech and language. No gross focal neurologic deficits are appreciated. No gait instability. Skin:  Skin is warm, dry and intact. No rash noted. Psychiatric: Mood and affect are normal. Speech and behavior are normal.  ____________________________________________   LABS (all labs ordered are listed,  but only abnormal results are displayed)  Labs Reviewed - No data to display ____________________________________________  EKG   ____________________________________________  RADIOLOGY  ED MD interpretation:    Official radiology report(s): Dg Lumbar Spine Complete  Result Date: 10/02/2018 CLINICAL DATA:  Low back and bilateral leg pain since right-sided knee surgery 5 days ago. Previous history of lumbar spine surgery approximately 24 years ago. History of diabetes. EXAM: LUMBAR SPINE - COMPLETE 4+ VIEW COMPARISON:  MRI of the lumbar spine of June 17, 2016 and coronal and sagittal reconstructed images through the lumbar spine from an abdominal and pelvic CT scan of December 28, 2009 FINDINGS: The lumbar vertebral bodies are preserved in height. There is multilevel moderate degenerative disc space narrowing which is not a new finding. There is grade 1 anterolisthesis of L3 with respect L2 and L4 with respect L3 which appears stable. There are prominent anterior and lateral endplate spurs at all lumbar levels. There is facet joint hypertrophy at L5-S1. There is a coarse calcification which projects in the right renal pelvis measuring approximately 1.3 cm in greatest dimension. There is an approximately 5 mm diameter lower pole left kidney stone. IMPRESSION: Multilevel degenerative disc disease. No compression fracture. Minimal grade 1 anterolisthesis of L3 and L4. Probable large stone in the left renal pelvis with smaller stone in the lower pole of the left kidney. Electronically Signed   By: David  Martinique M.D.   On: 10/02/2018 13:57    ____________________________________________   PROCEDURES  Procedure(s) performed: None  Procedures  Critical Care performed: No  ____________________________________________   INITIAL IMPRESSION / ASSESSMENT AND PLAN / ED COURSE  As part of my medical decision making, I reviewed the following data within the [place]     Patient presents to the ED secondary to back pain which occurred after twisting has been trying to go up some stairs sideways.  Patient state unable to weight-bear using his walker secondary to back pain.  Discussed x-ray findings with patient showing mostly degenerative changes in the back and a kidney stone left lower pole. Patient is not amenable to continue previous pain medications.  Patient is not amenable to to considering a mild muscle relaxant.  Contacted Dr. Geanie Cooley  and he will come down and talk to the patient.  Also talked to the resource nurse who is coordinating physical therapy for reevaluation.  Reevaluation revealed patient is in need of in-house rehab.  Patient will be discharged and transferred by EMS to [facility] rehab center.   ____________________________________________   FINAL CLINICAL IMPRESSION(S) / ED DIAGNOSES  Final diagnoses:  None     ED Discharge Orders    None       Note:  This document was prepared using Dragon voice recognition software and may include unintentional dictation errors.    Sable Feil, PA-C 10/02/18 1737    Eula Listen, MD 10/03/18 [phone]

## 2018-10-02 NOTE — Progress Notes (Signed)
Per [facility] [employer] they will need a new PT evaluation today in order to make a determination about SNF coverage. Clinical Education officer, museum (CSW) spoke with PT and they will see him today.   [employer], LCSW [phone]

## 2018-10-02 NOTE — ED Triage Notes (Signed)
Brought in via ems from   States he was just discharged yesterday for knee replacement  States he twisted wrong yesterday when he went home  Now having increased apin to lower back  On left sdie

## 2018-10-02 NOTE — Discharge Instructions (Addendum)
Patient status post right knee replacement.  Patient will be transferred to [facility] rehab for definitive treatment.

## 2018-10-02 NOTE — ED Notes (Addendum)
Per social work pt to be discharged to [facility] by EMS. Room 212. [phone] for report. She states to send oxycodone prescription be sent with pt to [facility].

## 2018-10-02 NOTE — ED Notes (Signed)
Ortho MD, Dr. Mack Guise at bedside.

## 2018-10-02 NOTE — ED Notes (Signed)
PT at bedside.

## 2018-10-02 NOTE — ED Notes (Signed)
Wife brought pt food/drink, ok per provider.

## 2018-10-02 NOTE — Progress Notes (Signed)
[employer] SNF authorization has been received for 5 days, authorization # [id number]. Per [facility] admissions coordinator at [facility] patient can come today to room 212. RN will call report at [phone] and arrange EMS for transport. Clinical Education officer, museum (CSW) sent D/C summary and FL2 from last admission to [facility]. Patient's wife Jeannene Patella is aware of above. Pam understands that patient will have a $10-$20 co-pay per pay at [facility]. Please reconsult if future social work needs arise. CSW signing off.   [employer], LCSW [phone]

## 2018-10-02 NOTE — Progress Notes (Signed)
FL2 complete from patient's last admission and was faxed out. Clinical Education officer, museum (CSW) contacted patient's wife Pam and made her aware only bed offer at this time is [facility]. Pam accepted bed offer. Pam is aware that [facility] will have to approve SNF. [facility] admissions coordinator at [facility] is aware of above.   [employer], LCSW [phone]

## 2018-10-02 NOTE — ED Notes (Signed)
Pt taken to [facility]

## 2018-10-02 NOTE — ED Notes (Signed)
Pt used urinal by self.

## 2018-10-02 NOTE — Progress Notes (Signed)
FL2 complete from patient's last admission and was faxed out. Clinical Education officer, museum (CSW) contacted patient's wife Larry Roman and made her aware only bed offer at this time is [facility]. Larry Roman accepted bed offer. Larry Roman is aware that [employer] Team will have to approve SNF. [facility] admissions coordinator at [facility] is aware of above.   [employer], LCSW [phone]

## 2018-10-02 NOTE — Progress Notes (Cosign Needed)
[facility]  Physical Therapy Certification  Patient Details  Name: Niels Cranshaw. MRN: [id number] Date of Birth: Apr 13, 1946 Medical Diagnosis: Active Problems:   * No active hospital problems. *  Visit Diagnosis: PT Visit Diagnosis: Muscle weakness (generalized) (M62.81), Difficulty in walking, not elsewhere classified (R26.2), Pain Pain - Right/Left: Right Pain - part of body: Leg PT Problem: Decreased strength, Decreased range of motion, Decreased activity tolerance, Decreased balance, Decreased mobility, Decreased coordination, Decreased knowledge of use of DME, Decreased safety awareness, Pain  Goals: Pt will go Supine/Side to Sit: with min guard assist Patient will transfer sit to/from stand: with min guard assist Pt will Ambulate: 50 feet, with minimal assist, with rolling walker  Duration: Services will be provided through the following date: 10/16/18  Frequency: BID  Amount: one treatment session per day unless otherwise indicated.    Certification Start Date: 60/63/0160 Certification End Date: 10/16/18   PT Treatments/Interventions: DME instruction, Gait training, Stair training, Functional mobility training, Therapeutic activities, Therapeutic exercise, Balance training, Neuromuscular re-education, Patient/family education  Lucious Zou H. Owens Shark, PT, DPT, NCS 10/02/18, 11:31 PM [phone]

## 2018-10-02 NOTE — Care Management Note (Signed)
Case Management Note  Patient Details  Name: Larry Roman. MRN: [id number] Date of Birth: [birth year]  Subjective/Objective:        Patient is being seen in the ED for back pain.  Wife is with the patient and she and he report that they were discharged yesterday after the patient had a total knee replacement last Thursday 10/24.  Home health services with [facility] were arranged but it will not be sufficient for patient needs. Wife reports that she is unable to care for him at home and he is not mobile enough to care for himself after the knee replacement.  The patient reports that he has such bad pain in both knees that he cannot walk.  The patient and the wife state that he is not safe to be at home and they are going to stay here in the ED until he can be placed in rehab somewhere.  CSW is working on placement.  PT order is in, PT needs to come and evaluate the patient.                Action/Plan:   Expected Discharge Date:                  Expected Discharge Plan:     In-House Referral:  Clinical Social Work  Discharge planning Services  CM Consult  Post Acute Care Choice:    Choice offered to:     DME Arranged:    DME Agency:     HH Arranged:    HH Agency:     Status of Service:  In process, will continue to follow  If discussed at Long Length of Stay Meetings, dates discussed:    Additional Comments:  Shelbie Hutching, RN 10/02/2018, 3:25 PM

## 2018-10-03 DIAGNOSIS — M10472 Other secondary gout, left ankle and foot: Secondary | ICD-10-CM | POA: Diagnosis not present

## 2018-10-03 DIAGNOSIS — M546 Pain in thoracic spine: Secondary | ICD-10-CM | POA: Diagnosis not present

## 2018-10-03 DIAGNOSIS — M1711 Unilateral primary osteoarthritis, right knee: Secondary | ICD-10-CM | POA: Diagnosis not present

## 2018-10-04 ENCOUNTER — Other Ambulatory Visit: Payer: Self-pay

## 2018-10-04 NOTE — Patient Outreach (Signed)
[facility]) Care Management  10/04/2018  Larry Roman. 03-11-1946 [id number]  Successful outreach to [name] today.  Patient reports that he is in rehab, so I informed him that I will outreach to him when he returns home.  Joetta Manners, PharmD Clinical Pharmacist [place] [phone]

## 2018-10-08 ENCOUNTER — Other Ambulatory Visit: Payer: Self-pay | Admitting: Family

## 2018-10-08 ENCOUNTER — Other Ambulatory Visit: Payer: Self-pay | Admitting: Adult Health

## 2018-10-08 MED ORDER — MIDAZOLAM HCL 5 MG/5ML IJ SOLN
INTRAMUSCULAR | Status: DC | PRN
  Administered 2018-09-27: 1 mg via INTRAVENOUS

## 2018-10-08 MED ORDER — OXYCODONE HCL 5 MG PO TABS: 5.0000 mg | ORAL_TABLET | ORAL | 0 refills | Status: DC | PRN

## 2018-10-08 NOTE — Addendum Note (Signed)
Addendum  created 10/08/18 1403 by Justus Memory, CRNA   Intraprocedure Event edited, Intraprocedure Meds edited

## 2018-10-09 ENCOUNTER — Other Ambulatory Visit: Payer: Self-pay | Admitting: Adult Health

## 2018-10-09 ENCOUNTER — Encounter
Admission: RE | Admit: 2018-10-09 | Discharge: 2018-10-09 | Disposition: A | Discharge status: Home / Self Care | Payer: PPO | Source: Ambulatory Visit | Attending: Internal Medicine | Admitting: Internal Medicine

## 2018-10-10 DIAGNOSIS — Z96659 Presence of unspecified artificial knee joint: Secondary | ICD-10-CM | POA: Diagnosis not present

## 2018-10-11 ENCOUNTER — Non-Acute Institutional Stay (SKILLED_NURSING_FACILITY): Payer: PPO | Admitting: Adult Health

## 2018-10-11 ENCOUNTER — Encounter: Payer: Self-pay | Admitting: Adult Health

## 2018-10-11 DIAGNOSIS — M1711 Unilateral primary osteoarthritis, right knee: Secondary | ICD-10-CM

## 2018-10-11 DIAGNOSIS — Z96651 Presence of right artificial knee joint: Secondary | ICD-10-CM

## 2018-10-13 DIAGNOSIS — M1711 Unilateral primary osteoarthritis, right knee: Secondary | ICD-10-CM | POA: Insufficient documentation

## 2018-10-13 NOTE — Progress Notes (Signed)
Location:   [facility] Room Number: 212 A Place of Service:  SNF (31)   CODE STATUS: full code   Allergies  Allergen Reactions  . Ibuprofen Other (See Comments)    Ibuprofen and motrin causes bp to elevate    Chief Complaint  Patient presents with  . Acute Visit    Care Plan Meeting    HPI:  We have come together for his routine care plan meeting. He is ambulating with a walker in therapy. He will need to go up 3 steps in front or 5 steps in back. He will not need any dme; will need home health therapy. He continues to get stronger. He does have some upper back pain; which is getting better. He has had worsening nocturia since his foley has been removed. He did state that last night it started to get better. More than likely his prostate became irritated from the foley. He denies any uncontrolled pain; no changes in appetite.  He should be going home in the next 1-2 weeks.   Past Medical History:  Diagnosis Date  . Depression   . DM (diabetes mellitus) ([place])   . Extrinsic asthma, unspecified    childhood  . Hives of unknown origin   . HTN (hypertension)   . Other allergy, other than to medicinal agents   . RBBB   . Sleep apnea    25 years ago mild lost weight no longer uses cpap  . Wound of right leg 08/2018   area just below knee size of quarter, reddened around edges    Past Surgical History:  Procedure Laterality Date  . BACK SURGERY  1995   ruptured disc encapsulated nerves  . CATARACT EXTRACTION, BILATERAL  2016   Dr. Kerman Passey at [facility]  . EYE SURGERY Bilateral    cataract extractions  . TONSILLECTOMY    . TOTAL KNEE ARTHROPLASTY Right 09/27/2018   Procedure: TOTAL KNEE ARTHROPLASTY;  Surgeon: Thornton Park, MD;  Location: [facility] ORS;  Service: Orthopedics;  Laterality: Right;    Social History   Socioeconomic History  . Marital status: Married    Spouse name: Pam  . Number of children: 2  . Years of education: Not on file    . Highest education level: Not on file  Occupational History    Employer: [employer]    Comment: retired  Scientific laboratory technician  . Financial resource strain: Not hard at all  . Food insecurity:    Worry: Never true    Inability: Never true  . Transportation needs:    Medical: No    Non-medical: No  Tobacco Use  . Smoking status: Never Smoker  . Smokeless tobacco: Never Used  . Tobacco comment: tobacco use - no  Substance and Sexual Activity  . Alcohol use: Yes    Alcohol/week: 0.0 standard drinks    Comment: rare  . Drug use: No  . Sexual activity: Yes  Lifestyle  . Physical activity:    Days per week: Not on file    Minutes per session: Not on file  . Stress: Not at all  Relationships  . Social connections:    Talks on phone: Not on file    Gets together: Not on file    Attends religious service: Not on file    Active member of club or organization: Not on file    Attends meetings of clubs or organizations: Not on file    Relationship status: Not on  file  . Intimate partner violence:    Fear of current or ex partner: No    Emotionally abused: No    Physically abused: No    Forced sexual activity: No  Other Topics Concern  . Not on file  Social History Narrative  . Not on file   Family History  Problem Relation Age of Onset  . Heart failure Father   . Heart attack Mother       VITAL SIGNS BP 121/68   Pulse 90   Temp 98.2 F (36.8 C)   Resp 20   Ht 5\' 11"  (1.803 m)   Wt 207 lb (93.9 kg)   SpO2 95%   BMI 28.87 kg/m   Outpatient Encounter Medications as of 10/11/2018  Medication Sig  . ACCU-CHEK SOFTCLIX LANCETS lancets Use up to 4 times daily to check blood sugar. Diagnosis E11.9  . acetaminophen (TYLENOL) 650 MG CR tablet Take 1,300 mg by mouth 2 (two) times daily.   Marland Kitchen amLODipine (NORVASC) 10 MG tablet Take 1 tablet (10 mg total) by mouth daily with lunch.  Marland Kitchen aspirin EC 81 MG tablet Take 81 mg by mouth daily.    . Azelastine HCl 137 MCG/SPRAY SOLN  Place 1 spray into both nostrils 2 (two) times daily.  . bisacodyl (DULCOLAX) 5 MG EC tablet Take 2 tablets (10 mg total) by mouth daily as needed for moderate constipation.  . Cholecalciferol (VITAMIN D3) 2000 UNITS TABS Take 2,000 Units by mouth daily.   . colchicine 0.6 MG tablet Take 0.6 mg by mouth 2 (two) times daily.  . Cyanocobalamin (B-12) 5000 MCG SUBL Place 5,000 mcg under the tongue daily.   Marland Kitchen docusate sodium (COLACE) 100 MG capsule Take 1 capsule (100 mg total) by mouth 2 (two) times daily.  . DULoxetine (CYMBALTA) 30 MG capsule TAKE 1 CAPSULE BY MOUTH EVERY DAY  . enoxaparin (LOVENOX) 40 MG/0.4ML injection Inject 0.4 mLs (40 mg total) into the skin daily.  . fluconazole (DIFLUCAN) 200 MG tablet Take 200 mg by mouth every Monday.   . hydrochlorothiazide (MICROZIDE) 12.5 MG capsule Take 12.5 mg by mouth daily.  Marland Kitchen loratadine (CLARITIN) 10 MG tablet Take 10 mg by mouth daily.  Marland Kitchen losartan (COZAAR) 100 MG tablet Take 100 mg by mouth daily.  . metFORMIN (GLUCOPHAGE) 500 MG tablet Take by mouth 2 (two) times daily with a meal.  . methocarbamol (ROBAXIN) 500 MG tablet Take 500 mg by mouth every 6 (six) hours as needed for muscle spasms.  . metoprolol succinate (TOPROL-XL) 25 MG 24 hr tablet TAKE 1 TABLET BY MOUTH EVERY DAY  . NON FORMULARY Diet Type: NCS  . ONETOUCH VERIO test strip TEST 3 TIMES A DAY  . oxyCODONE (ROXICODONE) 5 MG immediate release tablet Take 1 tablet (5 mg total) by mouth every 4 (four) hours as needed for severe pain.  . pravastatin (PRAVACHOL) 40 MG tablet TAKE 1 TABLET BY MOUTH EVERY DAY  . ranitidine (ZANTAC) 75 MG tablet Take 75 mg by mouth daily.   . tamsulosin (FLOMAX) 0.4 MG CAPS capsule Take 0.4 mg by mouth daily.  . traZODone (DESYREL) 50 MG tablet TAKE 1 TABLET (50 MG TOTAL) BY MOUTH AT BEDTIME AS NEEDED FOR SLEEP.   No facility-administered encounter medications on file as of 10/11/2018.      SIGNIFICANT DIAGNOSTIC EXAMS  LABS REVIEWED TODAY:    10-02-18: wbc 8.4; hgb 10.2; hct 30.6; mcv 93.0; plt 302; glucose 144; bun 14; creat 0.68; k+ 3.0; na++136;  ca 8.6    Review of Systems  Constitutional: Negative for malaise/fatigue.  Respiratory: Negative for cough and shortness of breath.   Cardiovascular: Negative for chest pain, palpitations and leg swelling.  Gastrointestinal: Negative for abdominal pain, constipation and heartburn.  Musculoskeletal: Negative for back pain, joint pain and myalgias.  Skin: Negative.   Neurological: Negative for dizziness.  Psychiatric/Behavioral: The patient is not nervous/anxious.     Physical Exam  Constitutional: He is oriented to person, place, and time. He appears well-developed and well-nourished. No distress.  Neck: No thyromegaly present.  Cardiovascular: Normal rate, regular rhythm, normal heart sounds and intact distal pulses.  Pulmonary/Chest: Effort normal and breath sounds normal. No respiratory distress.  Abdominal: Soft. Bowel sounds are normal. He exhibits no distension. There is no tenderness.  Musculoskeletal: He exhibits no edema.  Is status post right knee replacement  Lymphadenopathy:    He has no cervical adenopathy.  Neurological: He is alert and oriented to person, place, and time.  Skin: Skin is warm and dry. He is not diaphoretic.  Incision line without signs of infection present   Psychiatric: He has a normal mood and affect.     ASSESSMENT/ PLAN:  TODAY;   1. Primary osteoarthritis right knee 2. S/p TKR using cement, right   Will continue therapy as directed  will continue current plan of care Will continue current medication regimen   MD is aware of resident's narcotic use and is in agreement with current plan of care. We will attempt to wean resident as apropriate   Ok Edwards NP [facility]  Contact [phone] Monday through Friday 8am- 5pm  After hours call [phone]

## 2018-10-15 ENCOUNTER — Other Ambulatory Visit: Payer: Self-pay | Admitting: Adult Health

## 2018-10-15 ENCOUNTER — Non-Acute Institutional Stay (SKILLED_NURSING_FACILITY): Payer: PPO | Admitting: Adult Health

## 2018-10-15 ENCOUNTER — Encounter: Payer: Self-pay | Admitting: Adult Health

## 2018-10-15 DIAGNOSIS — Z96651 Presence of right artificial knee joint: Secondary | ICD-10-CM | POA: Diagnosis not present

## 2018-10-15 DIAGNOSIS — M1711 Unilateral primary osteoarthritis, right knee: Secondary | ICD-10-CM | POA: Diagnosis not present

## 2018-10-15 DIAGNOSIS — N400 Enlarged prostate without lower urinary tract symptoms: Secondary | ICD-10-CM

## 2018-10-15 DIAGNOSIS — I10 Essential (primary) hypertension: Secondary | ICD-10-CM

## 2018-10-15 DIAGNOSIS — E785 Hyperlipidemia, unspecified: Secondary | ICD-10-CM

## 2018-10-15 DIAGNOSIS — F339 Major depressive disorder, recurrent, unspecified: Secondary | ICD-10-CM

## 2018-10-15 MED ORDER — AZELASTINE HCL 137 MCG/SPRAY NA SOLN: 1.0000 | Freq: Two times a day (BID) | NASAL | 0 refills | Status: DC

## 2018-10-15 MED ORDER — AMLODIPINE BESYLATE 10 MG PO TABS: 10.0000 mg | ORAL_TABLET | Freq: Every day | ORAL | 0 refills | Status: DC

## 2018-10-15 MED ORDER — TRAZODONE HCL 50 MG PO TABS: 50.0000 mg | ORAL_TABLET | Freq: Every evening | ORAL | 0 refills | Status: DC | PRN

## 2018-10-15 MED ORDER — DULOXETINE HCL 30 MG PO CPEP: 30.0000 mg | ORAL_CAPSULE | Freq: Every day | ORAL | 0 refills | Status: DC

## 2018-10-15 MED ORDER — COLCHICINE 0.6 MG PO TABS: 0.6000 mg | ORAL_TABLET | Freq: Two times a day (BID) | ORAL | 0 refills | Status: DC

## 2018-10-15 MED ORDER — METFORMIN HCL 500 MG PO TABS: 500.0000 mg | ORAL_TABLET | Freq: Two times a day (BID) | ORAL | 0 refills | Status: DC

## 2018-10-15 MED ORDER — GLUCOSE BLOOD VI STRP: ORAL_STRIP | 0 refills | Status: AC

## 2018-10-15 MED ORDER — OXYCODONE HCL 5 MG PO TABS: 5.0000 mg | ORAL_TABLET | ORAL | 0 refills | Status: DC | PRN

## 2018-10-15 MED ORDER — LOSARTAN POTASSIUM 100 MG PO TABS: 100.0000 mg | ORAL_TABLET | Freq: Every day | ORAL | 0 refills | Status: DC

## 2018-10-15 MED ORDER — METHOCARBAMOL 500 MG PO TABS: 500.0000 mg | ORAL_TABLET | Freq: Four times a day (QID) | ORAL | 0 refills | Status: DC | PRN

## 2018-10-15 MED ORDER — METOPROLOL SUCCINATE ER 25 MG PO TB24: 25.0000 mg | ORAL_TABLET | Freq: Every day | ORAL | 0 refills | Status: DC

## 2018-10-15 MED ORDER — TAMSULOSIN HCL 0.4 MG PO CAPS: 0.4000 mg | ORAL_CAPSULE | Freq: Every day | ORAL | 0 refills | Status: DC

## 2018-10-15 MED ORDER — HYDROCHLOROTHIAZIDE 12.5 MG PO CAPS: 12.5000 mg | ORAL_CAPSULE | Freq: Every day | ORAL | 0 refills | Status: DC

## 2018-10-15 MED ORDER — FLUCONAZOLE 200 MG PO TABS: 200.0000 mg | ORAL_TABLET | ORAL | 0 refills | Status: DC

## 2018-10-15 MED ORDER — PRAVASTATIN SODIUM 40 MG PO TABS: 40.0000 mg | ORAL_TABLET | Freq: Every day | ORAL | 0 refills | Status: DC

## 2018-10-15 NOTE — Progress Notes (Signed)
Location:   [place] [facility] Room Number: [place] of Service:  SNF (31)    CODE STATUS: Full Code  Allergies  Allergen Reactions  . Ibuprofen Other (See Comments)    Ibuprofen and motrin causes bp to elevate    Chief Complaint  Patient presents with  . Discharge Note    Discharging to home on 10/16/18    HPI:  He is being discharge to home with home health for pt/ot. He has all needed dme. He will need his prescriptions written and will need to follow up with his medical provider. He was admitted to the hospital for a right knee replacement. He was admitted to this facility for short term rehab and is now ready for discharge to home.     Past Medical History:  Diagnosis Date  . Depression   . DM (diabetes mellitus) ([place])   . Extrinsic asthma, unspecified    childhood  . Hives of unknown origin   . HTN (hypertension)   . Other allergy, other than to medicinal agents   . RBBB   . Sleep apnea    25 years ago mild lost weight no longer uses cpap  . Wound of right leg 08/2018   area just below knee size of quarter, reddened around edges    Past Surgical History:  Procedure Laterality Date  . BACK SURGERY  1995   ruptured disc encapsulated nerves  . CATARACT EXTRACTION, BILATERAL  2016   Dr. Kerman Passey at [facility]  . EYE SURGERY Bilateral    cataract extractions  . TONSILLECTOMY    . TOTAL KNEE ARTHROPLASTY Right 09/27/2018   Procedure: TOTAL KNEE ARTHROPLASTY;  Surgeon: Thornton Park, MD;  Location: [facility] ORS;  Service: Orthopedics;  Laterality: Right;    Social History   Socioeconomic History  . Marital status: Married    Spouse name: Pam  . Number of children: 2  . Years of education: Not on file  . Highest education level: Not on file  Occupational History    Employer: [employer]    Comment: retired  Scientific laboratory technician  . Financial resource strain: Not hard at all  . Food insecurity:    Worry: Never true   Inability: Never true  . Transportation needs:    Medical: No    Non-medical: No  Tobacco Use  . Smoking status: Never Smoker  . Smokeless tobacco: Never Used  . Tobacco comment: tobacco use - no  Substance and Sexual Activity  . Alcohol use: Yes    Alcohol/week: 0.0 standard drinks    Comment: rare  . Drug use: No  . Sexual activity: Yes  Lifestyle  . Physical activity:    Days per week: Not on file    Minutes per session: Not on file  . Stress: Not at all  Relationships  . Social connections:    Talks on phone: Not on file    Gets together: Not on file    Attends religious service: Not on file    Active member of club or organization: Not on file    Attends meetings of clubs or organizations: Not on file    Relationship status: Not on file  . Intimate partner violence:    Fear of current or ex partner: No    Emotionally abused: No    Physically abused: No    Forced sexual activity: No  Other Topics Concern  . Not on file  Social History  Narrative  . Not on file   Family History  Problem Relation Age of Onset  . Heart failure Father   . Heart attack Mother     VITAL SIGNS BP 115/72   Pulse 89   Temp 98.4 F (36.9 C)   Resp 18   Ht 5\' 11"  (1.803 m)   Wt 207 lb (93.9 kg)   SpO2 100%   BMI 28.87 kg/m   Patient's Medications  New Prescriptions   No medications on file  Previous Medications   ACCU-CHEK SOFTCLIX LANCETS LANCETS    Use up to 4 times daily to check blood sugar. Diagnosis E11.9   ACETAMINOPHEN (TYLENOL) 650 MG CR TABLET    Take 1,300 mg by mouth 2 (two) times daily.    AMLODIPINE (NORVASC) 10 MG TABLET    Take 1 tablet (10 mg total) by mouth daily with lunch.   ASPIRIN EC 81 MG TABLET    Take 81 mg by mouth daily.     AZELASTINE HCL 137 MCG/SPRAY SOLN    Place 1 spray into both nostrils 2 (two) times daily.   BISACODYL (DULCOLAX) 5 MG EC TABLET    Take 2 tablets (10 mg total) by mouth daily as needed for moderate constipation.    CHOLECALCIFEROL (VITAMIN D3) 2000 UNITS TABS    Take 2,000 Units by mouth daily.    COLCHICINE 0.6 MG TABLET    Take 0.6 mg by mouth 2 (two) times daily.   CYANOCOBALAMIN (B-12) 5000 MCG SUBL    Place 5,000 mcg under the tongue daily.    DOCUSATE SODIUM (COLACE) 100 MG CAPSULE    Take 1 capsule (100 mg total) by mouth 2 (two) times daily.   DULOXETINE (CYMBALTA) 30 MG CAPSULE    Take 60 mg by mouth daily.   ENOXAPARIN (LOVENOX) 40 MG/0.4ML INJECTION    Inject 0.4 mLs (40 mg total) into the skin daily.   FLUCONAZOLE (DIFLUCAN) 200 MG TABLET    Take 200 mg by mouth every Monday.    HYDROCHLOROTHIAZIDE (MICROZIDE) 12.5 MG CAPSULE    Take 12.5 mg by mouth daily.   LORATADINE (CLARITIN) 10 MG TABLET    Take 10 mg by mouth daily.   LOSARTAN (COZAAR) 100 MG TABLET    Take 100 mg by mouth daily.   METFORMIN (GLUCOPHAGE) 500 MG TABLET    Take by mouth 2 (two) times daily with a meal.   METHOCARBAMOL (ROBAXIN) 500 MG TABLET    Take 500 mg by mouth every 6 (six) hours as needed for muscle spasms.   METOPROLOL SUCCINATE (TOPROL-XL) 25 MG 24 HR TABLET    TAKE 1 TABLET BY MOUTH EVERY DAY   NON FORMULARY    Diet Type: NCS   ONETOUCH VERIO TEST STRIP    TEST 3 TIMES A DAY   OXYCODONE (OXY IR/ROXICODONE) 5 MG IMMEDIATE RELEASE TABLET    Take 5 mg by mouth every 4 (four) hours as needed for severe pain.   PRAVASTATIN (PRAVACHOL) 40 MG TABLET    TAKE 1 TABLET BY MOUTH EVERY DAY   RANITIDINE (ZANTAC) 75 MG TABLET    Take 75 mg by mouth daily.    TAMSULOSIN (FLOMAX) 0.4 MG CAPS CAPSULE    Take 0.4 mg by mouth daily.   TRAZODONE (DESYREL) 50 MG TABLET    TAKE 1 TABLET (50 MG TOTAL) BY MOUTH AT BEDTIME AS NEEDED FOR SLEEP.  Modified Medications   No medications on file  Discontinued Medications   DULOXETINE (  CYMBALTA) 30 MG CAPSULE    TAKE 1 CAPSULE BY MOUTH EVERY DAY   OXYCODONE (ROXICODONE) 5 MG IMMEDIATE RELEASE TABLET    Take 1 tablet (5 mg total) by mouth every 4 (four) hours as needed for severe pain.      SIGNIFICANT DIAGNOSTIC EXAMS  LABS REVIEWED PREVIOUS:   10-02-18: wbc 8.4; hgb 10.2; hct 30.6; mcv 93.0; plt 302; glucose 144; bun 14; creat 0.68; k+ 3.0; na++136; ca 8.6    NO NEW LABS.    Review of Systems  Constitutional: Negative for malaise/fatigue.  Respiratory: Negative for cough and shortness of breath.   Cardiovascular: Negative for chest pain, palpitations and leg swelling.  Gastrointestinal: Negative for abdominal pain, constipation and heartburn.  Musculoskeletal: Negative for back pain, joint pain and myalgias.  Skin: Negative.   Neurological: Negative for dizziness.  Psychiatric/Behavioral: The patient is not nervous/anxious.    Physical Exam  Constitutional: He is oriented to person, place, and time. He appears well-developed and well-nourished. No distress.  Neck: No thyromegaly present.  Cardiovascular: Normal rate, regular rhythm, normal heart sounds and intact distal pulses.  Pulmonary/Chest: Effort normal and breath sounds normal. No respiratory distress.  Abdominal: Soft. Bowel sounds are normal. He exhibits no distension. There is no tenderness.  Musculoskeletal: Normal range of motion. He exhibits no edema.  Is status post right knee replacement Using walker   Lymphadenopathy:    He has no cervical adenopathy.  Neurological: He is alert and oriented to person, place, and time.  Skin: Skin is warm and dry. He is not diaphoretic.  Incision line without signs of infection present.   Psychiatric: He has a normal mood and affect.      ASSESSMENT/ PLAN:  Patient is being discharged with the following home health services:  Pt/ot to evaluate and treat as indicated for gait balance strength adl training.   Patient is being discharged with the following durable medical equipment:  None needed   Patient has been advised to f/u with their PCP in 1-2 weeks to bring them up to date on their rehab stay.  Social services at facility was responsible for  arranging this appointment.  Pt was provided with a 30 day supply of prescriptions for medications and refills must be obtained from their PCP.  For controlled substances, a more limited supply may be provided adequate until PCP appointment only.  A 30 day supply of his prescription medications have been sent to [facility] on [address]; [address] [place] [address] oxycodone 5 mg tabs.    Time spent patient 35 minutes: discussed medications; dme; home health needs and expectations; verbalized understanding.    Ok Edwards NP [facility]  Contact [phone] Monday through Friday 8am- 5pm  After hours call [phone]

## 2018-10-16 ENCOUNTER — Other Ambulatory Visit: Payer: Self-pay

## 2018-10-16 ENCOUNTER — Other Ambulatory Visit: Payer: Self-pay | Admitting: Adult Health

## 2018-10-16 DIAGNOSIS — E785 Hyperlipidemia, unspecified: Secondary | ICD-10-CM

## 2018-10-16 MED ORDER — PRAVASTATIN SODIUM 40 MG PO TABS: 40.0000 mg | ORAL_TABLET | Freq: Every day | ORAL | 0 refills | Status: DC

## 2018-10-16 MED ORDER — METHOCARBAMOL 500 MG PO TABS: 500.0000 mg | ORAL_TABLET | Freq: Four times a day (QID) | ORAL | 0 refills | Status: DC | PRN

## 2018-10-17 ENCOUNTER — Ambulatory Visit: Payer: PPO

## 2018-10-17 ENCOUNTER — Ambulatory Visit: Payer: PPO | Admitting: Family

## 2018-10-18 ENCOUNTER — Other Ambulatory Visit: Payer: Self-pay

## 2018-10-18 DIAGNOSIS — Z471 Aftercare following joint replacement surgery: Secondary | ICD-10-CM | POA: Diagnosis not present

## 2018-10-18 DIAGNOSIS — Z7982 Long term (current) use of aspirin: Secondary | ICD-10-CM | POA: Diagnosis not present

## 2018-10-18 DIAGNOSIS — Z9181 History of falling: Secondary | ICD-10-CM | POA: Diagnosis not present

## 2018-10-18 DIAGNOSIS — J45909 Unspecified asthma, uncomplicated: Secondary | ICD-10-CM | POA: Diagnosis not present

## 2018-10-18 DIAGNOSIS — Z7984 Long term (current) use of oral hypoglycemic drugs: Secondary | ICD-10-CM | POA: Diagnosis not present

## 2018-10-18 DIAGNOSIS — G4733 Obstructive sleep apnea (adult) (pediatric): Secondary | ICD-10-CM | POA: Diagnosis not present

## 2018-10-18 DIAGNOSIS — M109 Gout, unspecified: Secondary | ICD-10-CM | POA: Diagnosis not present

## 2018-10-18 DIAGNOSIS — I10 Essential (primary) hypertension: Secondary | ICD-10-CM | POA: Diagnosis not present

## 2018-10-18 DIAGNOSIS — I4510 Unspecified right bundle-branch block: Secondary | ICD-10-CM | POA: Diagnosis not present

## 2018-10-18 DIAGNOSIS — Z96651 Presence of right artificial knee joint: Secondary | ICD-10-CM | POA: Diagnosis not present

## 2018-10-18 DIAGNOSIS — N400 Enlarged prostate without lower urinary tract symptoms: Secondary | ICD-10-CM | POA: Diagnosis not present

## 2018-10-18 DIAGNOSIS — F339 Major depressive disorder, recurrent, unspecified: Secondary | ICD-10-CM | POA: Diagnosis not present

## 2018-10-18 DIAGNOSIS — E119 Type 2 diabetes mellitus without complications: Secondary | ICD-10-CM | POA: Diagnosis not present

## 2018-10-18 NOTE — Patient Outreach (Signed)
[place] [facility]) Care Management  10/18/2018  Larry Roman August 04, 1946 [id number]   Referral Date: 10/18/18 Referral Source: HTA report Date of Admission: 110/29/19 Diagnosis: Right knee replacement Date of Discharge: 10/16/18 Facility: [place]:  HTA  Outreach attempt: no answer.  HIPAA compliant voice message left.  Plan: RN CM will attempt patient again within 4 business day and send letter.     Jone Baseman, RN, MSN [facility] Care Management Care Management Coordinator Direct Line [phone] Toll Free: [phone]  Fax: [phone]

## 2018-10-18 NOTE — Telephone Encounter (Signed)
This encounter was created in error - please disregard.

## 2018-10-19 ENCOUNTER — Other Ambulatory Visit: Payer: Self-pay

## 2018-10-19 NOTE — Patient Outreach (Signed)
[place] [facility]) Care Management  10/19/2018  Larry Roman 12-22-1945 [id number]   Referral Date: 10/18/18 Referral Source: HTA report Date of Admission: 10/02/18 Diagnosis: Right knee replacement Date of Discharge: 10/16/18 Facility: [place]:  HTA  Outreach attempt:spoke with patient.  He states that all is well with being home.  He states he has assistance of his wife and gets around with the walker.  He states he has seen surgeon on Nov. 6th and will see again on Dec. 4th.  Patient reports that his pain is minimal.  Discussed pain control and taking something for pain prior to therapy sessions.  He states that the therapist came by yesterday and will be coming regularly for about one month.  No problems or questions with medication.  Patient denies any needs or concerns.    Plan: RN CM will close case.   Jone Baseman, RN, MSN [facility] Care Management Care Management Coordinator Direct Line [phone] Toll Free: [phone]  Fax: [phone]

## 2018-10-31 DIAGNOSIS — I10 Essential (primary) hypertension: Secondary | ICD-10-CM | POA: Diagnosis not present

## 2018-10-31 DIAGNOSIS — I4510 Unspecified right bundle-branch block: Secondary | ICD-10-CM | POA: Diagnosis not present

## 2018-10-31 DIAGNOSIS — J45909 Unspecified asthma, uncomplicated: Secondary | ICD-10-CM | POA: Diagnosis not present

## 2018-10-31 DIAGNOSIS — E119 Type 2 diabetes mellitus without complications: Secondary | ICD-10-CM | POA: Diagnosis not present

## 2018-10-31 DIAGNOSIS — Z471 Aftercare following joint replacement surgery: Secondary | ICD-10-CM | POA: Diagnosis not present

## 2018-10-31 DIAGNOSIS — M109 Gout, unspecified: Secondary | ICD-10-CM | POA: Diagnosis not present

## 2018-10-31 DIAGNOSIS — F339 Major depressive disorder, recurrent, unspecified: Secondary | ICD-10-CM | POA: Diagnosis not present

## 2018-10-31 DIAGNOSIS — G4733 Obstructive sleep apnea (adult) (pediatric): Secondary | ICD-10-CM | POA: Diagnosis not present

## 2018-11-07 ENCOUNTER — Other Ambulatory Visit: Payer: Self-pay | Admitting: Adult Health

## 2018-11-07 DIAGNOSIS — Z96651 Presence of right artificial knee joint: Secondary | ICD-10-CM | POA: Diagnosis not present

## 2018-11-09 DIAGNOSIS — M6281 Muscle weakness (generalized): Secondary | ICD-10-CM | POA: Diagnosis not present

## 2018-11-09 DIAGNOSIS — R262 Difficulty in walking, not elsewhere classified: Secondary | ICD-10-CM | POA: Diagnosis not present

## 2018-11-09 DIAGNOSIS — M25561 Pain in right knee: Secondary | ICD-10-CM | POA: Diagnosis not present

## 2018-11-12 DIAGNOSIS — M25561 Pain in right knee: Secondary | ICD-10-CM | POA: Diagnosis not present

## 2018-11-12 DIAGNOSIS — M6281 Muscle weakness (generalized): Secondary | ICD-10-CM | POA: Diagnosis not present

## 2018-11-12 DIAGNOSIS — R262 Difficulty in walking, not elsewhere classified: Secondary | ICD-10-CM | POA: Diagnosis not present

## 2018-11-15 DIAGNOSIS — M6281 Muscle weakness (generalized): Secondary | ICD-10-CM | POA: Diagnosis not present

## 2018-11-15 DIAGNOSIS — M25561 Pain in right knee: Secondary | ICD-10-CM | POA: Diagnosis not present

## 2018-11-15 DIAGNOSIS — R262 Difficulty in walking, not elsewhere classified: Secondary | ICD-10-CM | POA: Diagnosis not present

## 2018-11-20 DIAGNOSIS — M25561 Pain in right knee: Secondary | ICD-10-CM | POA: Diagnosis not present

## 2018-11-20 DIAGNOSIS — M6281 Muscle weakness (generalized): Secondary | ICD-10-CM | POA: Diagnosis not present

## 2018-11-20 DIAGNOSIS — R262 Difficulty in walking, not elsewhere classified: Secondary | ICD-10-CM | POA: Diagnosis not present

## 2018-11-22 DIAGNOSIS — M25561 Pain in right knee: Secondary | ICD-10-CM | POA: Diagnosis not present

## 2018-11-22 DIAGNOSIS — R262 Difficulty in walking, not elsewhere classified: Secondary | ICD-10-CM | POA: Diagnosis not present

## 2018-11-22 DIAGNOSIS — M6281 Muscle weakness (generalized): Secondary | ICD-10-CM | POA: Diagnosis not present

## 2018-11-26 DIAGNOSIS — R262 Difficulty in walking, not elsewhere classified: Secondary | ICD-10-CM | POA: Diagnosis not present

## 2018-11-26 DIAGNOSIS — M6281 Muscle weakness (generalized): Secondary | ICD-10-CM | POA: Diagnosis not present

## 2018-11-26 DIAGNOSIS — M25561 Pain in right knee: Secondary | ICD-10-CM | POA: Diagnosis not present

## 2018-11-29 DIAGNOSIS — R262 Difficulty in walking, not elsewhere classified: Secondary | ICD-10-CM | POA: Diagnosis not present

## 2018-11-29 DIAGNOSIS — M25561 Pain in right knee: Secondary | ICD-10-CM | POA: Diagnosis not present

## 2018-11-29 DIAGNOSIS — M6281 Muscle weakness (generalized): Secondary | ICD-10-CM | POA: Diagnosis not present

## 2018-12-03 DIAGNOSIS — M25561 Pain in right knee: Secondary | ICD-10-CM | POA: Diagnosis not present

## 2018-12-03 DIAGNOSIS — R262 Difficulty in walking, not elsewhere classified: Secondary | ICD-10-CM | POA: Diagnosis not present

## 2018-12-03 DIAGNOSIS — M6281 Muscle weakness (generalized): Secondary | ICD-10-CM | POA: Diagnosis not present

## 2018-12-07 DIAGNOSIS — M6281 Muscle weakness (generalized): Secondary | ICD-10-CM | POA: Diagnosis not present

## 2018-12-07 DIAGNOSIS — R262 Difficulty in walking, not elsewhere classified: Secondary | ICD-10-CM | POA: Diagnosis not present

## 2018-12-07 DIAGNOSIS — M25561 Pain in right knee: Secondary | ICD-10-CM | POA: Diagnosis not present

## 2018-12-12 DIAGNOSIS — R262 Difficulty in walking, not elsewhere classified: Secondary | ICD-10-CM | POA: Diagnosis not present

## 2018-12-12 DIAGNOSIS — M25561 Pain in right knee: Secondary | ICD-10-CM | POA: Diagnosis not present

## 2018-12-12 DIAGNOSIS — M6281 Muscle weakness (generalized): Secondary | ICD-10-CM | POA: Diagnosis not present

## 2018-12-14 DIAGNOSIS — M6281 Muscle weakness (generalized): Secondary | ICD-10-CM | POA: Diagnosis not present

## 2018-12-14 DIAGNOSIS — R262 Difficulty in walking, not elsewhere classified: Secondary | ICD-10-CM | POA: Diagnosis not present

## 2018-12-14 DIAGNOSIS — M25561 Pain in right knee: Secondary | ICD-10-CM | POA: Diagnosis not present

## 2018-12-16 ENCOUNTER — Other Ambulatory Visit: Payer: Self-pay | Admitting: Adult Health

## 2018-12-17 ENCOUNTER — Encounter: Payer: Self-pay | Admitting: Family

## 2018-12-17 ENCOUNTER — Other Ambulatory Visit: Payer: Self-pay

## 2018-12-17 ENCOUNTER — Other Ambulatory Visit: Payer: Self-pay | Admitting: Adult Health

## 2018-12-17 ENCOUNTER — Ambulatory Visit (INDEPENDENT_AMBULATORY_CARE_PROVIDER_SITE_OTHER): Payer: PPO | Admitting: Family

## 2018-12-17 VITALS — BP 118/58 | HR 81 | Temp 98.3°F | Wt 211.4 lb

## 2018-12-17 DIAGNOSIS — F339 Major depressive disorder, recurrent, unspecified: Secondary | ICD-10-CM | POA: Diagnosis not present

## 2018-12-17 DIAGNOSIS — I10 Essential (primary) hypertension: Secondary | ICD-10-CM

## 2018-12-17 DIAGNOSIS — E119 Type 2 diabetes mellitus without complications: Secondary | ICD-10-CM | POA: Diagnosis not present

## 2018-12-17 DIAGNOSIS — R5383 Other fatigue: Secondary | ICD-10-CM

## 2018-12-17 DIAGNOSIS — E785 Hyperlipidemia, unspecified: Secondary | ICD-10-CM

## 2018-12-17 DIAGNOSIS — G4733 Obstructive sleep apnea (adult) (pediatric): Secondary | ICD-10-CM | POA: Diagnosis not present

## 2018-12-17 LAB — CBC WITH DIFFERENTIAL/PLATELET
Basophils Absolute: 0.1 10*3/uL (ref 0.0–0.1)
Basophils Relative: 0.7 % (ref 0.0–3.0)
Eosinophils Absolute: 0.1 10*3/uL (ref 0.0–0.7)
Eosinophils Relative: 1.6 % (ref 0.0–5.0)
HCT: 40.6 % (ref 39.0–52.0)
Hemoglobin: 14 g/dL (ref 13.0–17.0)
LYMPHS ABS: 1.8 10*3/uL (ref 0.7–4.0)
Lymphocytes Relative: 18.5 % (ref 12.0–46.0)
MCHC: 34.3 g/dL (ref 30.0–36.0)
MCV: 93.2 fl (ref 78.0–100.0)
MONOS PCT: 7.3 % (ref 3.0–12.0)
Monocytes Absolute: 0.7 10*3/uL (ref 0.1–1.0)
NEUTROS ABS: 6.9 10*3/uL (ref 1.4–7.7)
NEUTROS PCT: 71.9 % (ref 43.0–77.0)
PLATELETS: 328 10*3/uL (ref 150.0–400.0)
RBC: 4.36 Mil/uL (ref 4.22–5.81)
RDW: 13.5 % (ref 11.5–15.5)
WBC: 9.6 10*3/uL (ref 4.0–10.5)

## 2018-12-17 LAB — COMPREHENSIVE METABOLIC PANEL
ALT: 15 U/L (ref 0–53)
AST: 13 U/L (ref 0–37)
Albumin: 4.4 g/dL (ref 3.5–5.2)
Alkaline Phosphatase: 52 U/L (ref 39–117)
BUN: 15 mg/dL (ref 6–23)
CO2: 31 meq/L (ref 19–32)
CREATININE: 0.89 mg/dL (ref 0.40–1.50)
Calcium: 10.1 mg/dL (ref 8.4–10.5)
Chloride: 100 mEq/L (ref 96–112)
GFR: 89.45 mL/min (ref >60.00)
GLUCOSE: 122 mg/dL — AB (ref 70–99)
Potassium: 4.2 mEq/L (ref 3.5–5.1)
SODIUM: 139 meq/L (ref 135–145)
Total Bilirubin: 0.9 mg/dL (ref 0.2–1.2)
Total Protein: 7.2 g/dL (ref 6.0–8.3)

## 2018-12-17 LAB — TSH: TSH: 1.63 u[IU]/mL (ref 0.35–4.50)

## 2018-12-17 LAB — HEMOGLOBIN A1C: HEMOGLOBIN A1C: 6.2 % (ref 4.6–6.5)

## 2018-12-17 MED ORDER — PRAVASTATIN SODIUM 40 MG PO TABS: 40.0000 mg | ORAL_TABLET | Freq: Every day | ORAL | 0 refills | Status: DC

## 2018-12-17 MED ORDER — METOPROLOL SUCCINATE ER 25 MG PO TB24: 25.0000 mg | ORAL_TABLET | Freq: Every day | ORAL | 0 refills | Status: DC

## 2018-12-17 MED ORDER — HYDROCHLOROTHIAZIDE 12.5 MG PO CAPS: 12.5000 mg | ORAL_CAPSULE | Freq: Every day | ORAL | 0 refills | Status: DC

## 2018-12-17 MED ORDER — LOSARTAN POTASSIUM 100 MG PO TABS: 100.0000 mg | ORAL_TABLET | Freq: Every day | ORAL | 0 refills | Status: DC

## 2018-12-17 NOTE — Assessment & Plan Note (Signed)
Suspect controlled, pending A1c

## 2018-12-17 NOTE — Assessment & Plan Note (Signed)
Doing well on Cymbalta, will continue

## 2018-12-17 NOTE — Assessment & Plan Note (Signed)
Controlled on current regimen, continue.

## 2018-12-17 NOTE — Progress Notes (Signed)
Subjective:    Patient ID: Larry Ramus., male    DOB: 12-11-1945, 73 y.o.   MRN: [id number]  CC: Larry Greenhaw. is a 73 y.o. male who presents today for follow up.   HPI: Right Knee replacement with Dr. Mack Guise 09/2018. Doing rehab twice per week for knee.Joined the [employer], doing the stationary bike, rowing machine. No CP, sob.   Hypertension- compliant with medication.  At home BP 140/85.   Diabetes- compliant with medication.  Appeared anemic- last labs at rehab. Some fatigue,' feels better once gets up and gets going'. No dizziness. Sleeping well. Snores. H/o Cipap. Tried machine and oral appliance and declines treatment. Thinks weight loss has helped.   Depression- cymbalta 30 mg has helped. Would like refill.        Colonoscopy UTD.  HISTORY:  Past Medical History:  Diagnosis Date  . Depression   . DM (diabetes mellitus) ([place])   . Extrinsic asthma, unspecified    childhood  . Hives of unknown origin   . HTN (hypertension)   . Other allergy, other than to medicinal agents   . RBBB   . Sleep apnea    25 years ago mild lost weight no longer uses cpap  . Wound of right leg 08/2018   area just below knee size of quarter, reddened around edges   Past Surgical History:  Procedure Laterality Date  . BACK SURGERY  1995   ruptured disc encapsulated nerves  . CATARACT EXTRACTION, BILATERAL  2016   Dr. Kerman Passey at [facility]  . EYE SURGERY Bilateral    cataract extractions  . TONSILLECTOMY    . 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 Heart failure Father   . Heart attack Mother     Allergies: Ibuprofen Current Outpatient Medications on File Prior to Visit  Medication Sig Dispense Refill  . ACCU-CHEK SOFTCLIX LANCETS lancets Use up to 4 times daily to check blood  sugar. Diagnosis E11.9 100 each 12  . acetaminophen (TYLENOL) 650 MG CR tablet Take 1,300 mg by mouth 2 (two) times daily.     Marland Kitchen amLODipine (NORVASC) 10 MG tablet Take 1 tablet (10 mg total) by mouth daily with lunch. 30 tablet 0  . aspirin EC 81 MG tablet Take 81 mg by mouth daily.      . Azelastine HCl 137 MCG/SPRAY SOLN Place 1 spray into both nostrils 2 (two) times daily. 1 Bottle 0  . Cholecalciferol (VITAMIN D3) 2000 UNITS TABS Take 2,000 Units by mouth daily.     . Cyanocobalamin (B-12) 5000 MCG SUBL Place 5,000 mcg under the tongue daily.     . DULoxetine (CYMBALTA) 30 MG capsule Take 1 capsule (30 mg total) by mouth daily. Take 2 capsules (60mg ) for one week then return back to 30 mg daily 60 capsule 0  . glucose blood (ONETOUCH VERIO) test strip TEST 3 TIMES A DAY 100 each 0  . loratadine (CLARITIN) 10 MG tablet Take 10 mg by mouth daily.    . metFORMIN (GLUCOPHAGE) 500 MG tablet Take 1 tablet (500 mg total) by mouth 2 (two) times daily with a meal. 60 tablet 0  . NON FORMULARY Diet Type: NCS    . oxyCODONE (OXY IR/ROXICODONE) 5 MG immediate release tablet Take 1 tablet (5 mg total)  by mouth every 4 (four) hours as needed for severe pain. 15 tablet 0  . tamsulosin (FLOMAX) 0.4 MG CAPS capsule Take 1 capsule (0.4 mg total) by mouth daily. 30 capsule 0  . traZODone (DESYREL) 50 MG tablet Take 1 tablet (50 mg total) by mouth at bedtime as needed for sleep. 30 tablet 0  . ranitidine (ZANTAC) 75 MG tablet Take 75 mg by mouth daily.      No current facility-administered medications on file prior to visit.     Social History   Tobacco Use  . Smoking status: Never Smoker  . Smokeless tobacco: Never Used  . Tobacco comment: tobacco use - no  Substance Use Topics  . Alcohol use: Yes    Alcohol/week: 0.0 standard drinks    Comment: rare  . Drug use: No    Review of Systems    Objective:    BP (!) 118/58 (BP Location: Left Arm, Patient Position: Sitting, Cuff Size: Large)   Pulse  81   Temp 98.3 F (36.8 C)   Wt 211 lb 6.4 oz (95.9 kg)   SpO2 98%   BMI 29.48 kg/m  BP Readings from Last 3 Encounters:  12/17/18 (!) 118/58  10/15/18 115/72  10/11/18 121/68   Wt Readings from Last 3 Encounters:  12/17/18 211 lb 6.4 oz (95.9 kg)  10/15/18 207 lb (93.9 kg)  10/11/18 207 lb (93.9 kg)    Physical Exam     Assessment & Plan:   Problem List Items Addressed This Visit      Cardiovascular and Mediastinum   Hypertension    Controlled on current regimen, continue.      Relevant Orders   Comprehensive metabolic panel     Respiratory   OSA (obstructive sleep apnea)    Not wearing cipap. Counseled on risk of untreated sleep apnea including stroke, heart attack.  Patient politely declines being tested again.        Endocrine   Diabetes mellitus type 2, controlled ([place])    Suspect controlled, pending A1c        Other   Depression, recurrent ([place])    Doing well on Cymbalta, will continue      Other fatigue - Primary    Etiology of fatigue is nonspecific at this point.  Suspect multifactorial including untreated sleep apnea, anemia which appears to be postsurgical.  Pending lab work.  Advised patient to follow-up with me if symptom persists, worsens.      Relevant Orders   Hemoglobin A1c   Comprehensive metabolic panel   Fecal occult blood, imunochemical   CBC with Differential/Platelet   TSH       I have discontinued Larry Hurter Jr.'s bisacodyl, docusate sodium, enoxaparin, colchicine, fluconazole, and methocarbamol. I am also having him maintain his aspirin EC, ranitidine, loratadine, Vitamin D3, B-12, ACCU-CHEK SOFTCLIX LANCETS, acetaminophen, NON FORMULARY, amLODipine, Azelastine HCl, DULoxetine, metFORMIN, glucose blood, tamsulosin, traZODone, and oxyCODONE.   No orders of the defined types were placed in this encounter.   Return precautions given.   Risks, benefits, and alternatives of the medications and treatment plan prescribed  today were discussed, and patient expressed understanding.   Education regarding symptom management and diagnosis given to patient on AVS.  Continue to follow with Larry Hawthorne, FNP for routine health maintenance.   Larry Roman agreed with plan.   Larry Paris, FNP

## 2018-12-17 NOTE — Assessment & Plan Note (Signed)
Not wearing cipap. Counseled on risk of untreated sleep apnea including stroke, heart attack.  Patient politely declines being tested again.

## 2018-12-17 NOTE — Patient Instructions (Addendum)
Please have eye exam  Please return stool cards as we discussed.   Nice to see you!

## 2018-12-17 NOTE — Assessment & Plan Note (Signed)
Etiology of fatigue is nonspecific at this point.  Suspect multifactorial including untreated sleep apnea, anemia which appears to be postsurgical.  Pending lab work.  Advised patient to follow-up with me if symptom persists, worsens.

## 2018-12-18 DIAGNOSIS — R262 Difficulty in walking, not elsewhere classified: Secondary | ICD-10-CM | POA: Diagnosis not present

## 2018-12-18 DIAGNOSIS — M25561 Pain in right knee: Secondary | ICD-10-CM | POA: Diagnosis not present

## 2018-12-18 DIAGNOSIS — M6281 Muscle weakness (generalized): Secondary | ICD-10-CM | POA: Diagnosis not present

## 2018-12-19 DIAGNOSIS — B351 Tinea unguium: Secondary | ICD-10-CM | POA: Diagnosis not present

## 2018-12-20 DIAGNOSIS — M25561 Pain in right knee: Secondary | ICD-10-CM | POA: Diagnosis not present

## 2018-12-20 DIAGNOSIS — M6281 Muscle weakness (generalized): Secondary | ICD-10-CM | POA: Diagnosis not present

## 2018-12-20 DIAGNOSIS — R262 Difficulty in walking, not elsewhere classified: Secondary | ICD-10-CM | POA: Diagnosis not present

## 2018-12-24 DIAGNOSIS — R262 Difficulty in walking, not elsewhere classified: Secondary | ICD-10-CM | POA: Diagnosis not present

## 2018-12-24 DIAGNOSIS — M25561 Pain in right knee: Secondary | ICD-10-CM | POA: Diagnosis not present

## 2018-12-24 DIAGNOSIS — M6281 Muscle weakness (generalized): Secondary | ICD-10-CM | POA: Diagnosis not present

## 2018-12-26 DIAGNOSIS — M6281 Muscle weakness (generalized): Secondary | ICD-10-CM | POA: Diagnosis not present

## 2018-12-26 DIAGNOSIS — R262 Difficulty in walking, not elsewhere classified: Secondary | ICD-10-CM | POA: Diagnosis not present

## 2018-12-26 DIAGNOSIS — M25561 Pain in right knee: Secondary | ICD-10-CM | POA: Diagnosis not present

## 2018-12-31 DIAGNOSIS — R262 Difficulty in walking, not elsewhere classified: Secondary | ICD-10-CM | POA: Diagnosis not present

## 2018-12-31 DIAGNOSIS — M6281 Muscle weakness (generalized): Secondary | ICD-10-CM | POA: Diagnosis not present

## 2018-12-31 DIAGNOSIS — M25561 Pain in right knee: Secondary | ICD-10-CM | POA: Diagnosis not present

## 2019-01-02 DIAGNOSIS — M6281 Muscle weakness (generalized): Secondary | ICD-10-CM | POA: Diagnosis not present

## 2019-01-02 DIAGNOSIS — M25561 Pain in right knee: Secondary | ICD-10-CM | POA: Diagnosis not present

## 2019-01-02 DIAGNOSIS — R262 Difficulty in walking, not elsewhere classified: Secondary | ICD-10-CM | POA: Diagnosis not present

## 2019-01-05 ENCOUNTER — Other Ambulatory Visit: Payer: Self-pay | Admitting: Family

## 2019-01-05 ENCOUNTER — Other Ambulatory Visit: Payer: Self-pay | Admitting: Adult Health

## 2019-01-05 DIAGNOSIS — E785 Hyperlipidemia, unspecified: Secondary | ICD-10-CM

## 2019-01-07 ENCOUNTER — Other Ambulatory Visit: Payer: Self-pay | Admitting: Family

## 2019-01-07 ENCOUNTER — Encounter: Payer: Self-pay | Admitting: Family

## 2019-01-07 DIAGNOSIS — I10 Essential (primary) hypertension: Secondary | ICD-10-CM

## 2019-01-07 DIAGNOSIS — R262 Difficulty in walking, not elsewhere classified: Secondary | ICD-10-CM | POA: Diagnosis not present

## 2019-01-07 DIAGNOSIS — M6281 Muscle weakness (generalized): Secondary | ICD-10-CM | POA: Diagnosis not present

## 2019-01-07 DIAGNOSIS — M25561 Pain in right knee: Secondary | ICD-10-CM | POA: Diagnosis not present

## 2019-01-07 MED ORDER — AMLODIPINE BESYLATE 10 MG PO TABS: 10.0000 mg | ORAL_TABLET | Freq: Every day | ORAL | 1 refills | Status: DC

## 2019-01-09 DIAGNOSIS — R262 Difficulty in walking, not elsewhere classified: Secondary | ICD-10-CM | POA: Diagnosis not present

## 2019-01-09 DIAGNOSIS — M6281 Muscle weakness (generalized): Secondary | ICD-10-CM | POA: Diagnosis not present

## 2019-01-09 DIAGNOSIS — M25561 Pain in right knee: Secondary | ICD-10-CM | POA: Diagnosis not present

## 2019-01-09 MED ORDER — PRAVASTATIN SODIUM 40 MG PO TABS: 40.0000 mg | ORAL_TABLET | Freq: Every day | ORAL | 0 refills | Status: DC

## 2019-01-10 ENCOUNTER — Other Ambulatory Visit: Payer: Self-pay | Admitting: Adult Health

## 2019-01-14 DIAGNOSIS — M6281 Muscle weakness (generalized): Secondary | ICD-10-CM | POA: Diagnosis not present

## 2019-01-14 DIAGNOSIS — M25561 Pain in right knee: Secondary | ICD-10-CM | POA: Diagnosis not present

## 2019-01-14 DIAGNOSIS — R262 Difficulty in walking, not elsewhere classified: Secondary | ICD-10-CM | POA: Diagnosis not present

## 2019-01-17 DIAGNOSIS — M25561 Pain in right knee: Secondary | ICD-10-CM | POA: Diagnosis not present

## 2019-01-17 DIAGNOSIS — M6281 Muscle weakness (generalized): Secondary | ICD-10-CM | POA: Diagnosis not present

## 2019-01-17 DIAGNOSIS — R262 Difficulty in walking, not elsewhere classified: Secondary | ICD-10-CM | POA: Diagnosis not present

## 2019-01-23 DIAGNOSIS — M25561 Pain in right knee: Secondary | ICD-10-CM | POA: Diagnosis not present

## 2019-01-23 DIAGNOSIS — M6281 Muscle weakness (generalized): Secondary | ICD-10-CM | POA: Diagnosis not present

## 2019-01-23 DIAGNOSIS — R262 Difficulty in walking, not elsewhere classified: Secondary | ICD-10-CM | POA: Diagnosis not present

## 2019-01-28 DIAGNOSIS — M6281 Muscle weakness (generalized): Secondary | ICD-10-CM | POA: Diagnosis not present

## 2019-01-28 DIAGNOSIS — R262 Difficulty in walking, not elsewhere classified: Secondary | ICD-10-CM | POA: Diagnosis not present

## 2019-01-28 DIAGNOSIS — M25561 Pain in right knee: Secondary | ICD-10-CM | POA: Diagnosis not present

## 2019-01-30 DIAGNOSIS — M25561 Pain in right knee: Secondary | ICD-10-CM | POA: Diagnosis not present

## 2019-01-30 DIAGNOSIS — R262 Difficulty in walking, not elsewhere classified: Secondary | ICD-10-CM | POA: Diagnosis not present

## 2019-01-30 DIAGNOSIS — M6281 Muscle weakness (generalized): Secondary | ICD-10-CM | POA: Diagnosis not present

## 2019-02-01 ENCOUNTER — Other Ambulatory Visit: Payer: Self-pay | Admitting: Adult Health

## 2019-02-01 DIAGNOSIS — F339 Major depressive disorder, recurrent, unspecified: Secondary | ICD-10-CM

## 2019-02-03 ENCOUNTER — Other Ambulatory Visit: Payer: Self-pay | Admitting: Adult Health

## 2019-02-05 DIAGNOSIS — R262 Difficulty in walking, not elsewhere classified: Secondary | ICD-10-CM | POA: Diagnosis not present

## 2019-02-05 DIAGNOSIS — M6281 Muscle weakness (generalized): Secondary | ICD-10-CM | POA: Diagnosis not present

## 2019-02-05 DIAGNOSIS — M25561 Pain in right knee: Secondary | ICD-10-CM | POA: Diagnosis not present

## 2019-02-06 DIAGNOSIS — M1712 Unilateral primary osteoarthritis, left knee: Secondary | ICD-10-CM | POA: Diagnosis not present

## 2019-02-06 DIAGNOSIS — M170 Bilateral primary osteoarthritis of knee: Secondary | ICD-10-CM | POA: Diagnosis not present

## 2019-02-07 ENCOUNTER — Other Ambulatory Visit: Payer: Self-pay | Admitting: Family

## 2019-02-07 ENCOUNTER — Other Ambulatory Visit: Payer: Self-pay | Admitting: Adult Health

## 2019-02-07 DIAGNOSIS — N400 Enlarged prostate without lower urinary tract symptoms: Secondary | ICD-10-CM

## 2019-02-07 NOTE — Telephone Encounter (Signed)
Last OV 12/17/2018  Last refilled 10/15/2018 disp 30 with no refills   Sent to PCP

## 2019-02-08 DIAGNOSIS — R262 Difficulty in walking, not elsewhere classified: Secondary | ICD-10-CM | POA: Diagnosis not present

## 2019-02-08 DIAGNOSIS — M25561 Pain in right knee: Secondary | ICD-10-CM | POA: Diagnosis not present

## 2019-02-08 DIAGNOSIS — M6281 Muscle weakness (generalized): Secondary | ICD-10-CM | POA: Diagnosis not present

## 2019-02-08 NOTE — Telephone Encounter (Signed)
Patent scheduled 03/18/19.

## 2019-02-08 NOTE — Telephone Encounter (Signed)
Call patient I realize he has not had a PSA done in over a year.  PSA is very important to ensure we monitor the prostate.  I ordered this lab. Please schedule, non fasting.   Also refilled the Flomax for him.

## 2019-02-12 ENCOUNTER — Encounter: Payer: Self-pay | Admitting: Family

## 2019-02-12 ENCOUNTER — Other Ambulatory Visit: Payer: Self-pay | Admitting: Adult Health

## 2019-02-12 ENCOUNTER — Other Ambulatory Visit: Payer: Self-pay

## 2019-02-12 DIAGNOSIS — M25561 Pain in right knee: Secondary | ICD-10-CM | POA: Diagnosis not present

## 2019-02-12 DIAGNOSIS — F339 Major depressive disorder, recurrent, unspecified: Secondary | ICD-10-CM

## 2019-02-12 DIAGNOSIS — R262 Difficulty in walking, not elsewhere classified: Secondary | ICD-10-CM | POA: Diagnosis not present

## 2019-02-12 DIAGNOSIS — M6281 Muscle weakness (generalized): Secondary | ICD-10-CM | POA: Diagnosis not present

## 2019-02-12 MED ORDER — TRAZODONE HCL 50 MG PO TABS: 50.0000 mg | ORAL_TABLET | Freq: Every evening | ORAL | 0 refills | Status: DC | PRN

## 2019-02-12 MED ORDER — METFORMIN HCL 500 MG PO TABS: 500.0000 mg | ORAL_TABLET | Freq: Two times a day (BID) | ORAL | 0 refills | Status: DC

## 2019-02-13 NOTE — Telephone Encounter (Signed)
Call patient Need to clarify as he is on the 30 mg of Cymbalta or the 60 mg.  Please correct and send in correct refill. Pended 30mg  tablet.

## 2019-02-14 ENCOUNTER — Other Ambulatory Visit: Payer: Self-pay

## 2019-02-14 DIAGNOSIS — M6281 Muscle weakness (generalized): Secondary | ICD-10-CM | POA: Diagnosis not present

## 2019-02-14 DIAGNOSIS — R262 Difficulty in walking, not elsewhere classified: Secondary | ICD-10-CM | POA: Diagnosis not present

## 2019-02-14 DIAGNOSIS — M25561 Pain in right knee: Secondary | ICD-10-CM | POA: Diagnosis not present

## 2019-02-14 MED ORDER — DULOXETINE HCL 30 MG PO CPEP: 30.0000 mg | ORAL_CAPSULE | Freq: Every day | ORAL | 0 refills | Status: DC

## 2019-02-14 NOTE — Telephone Encounter (Signed)
I have ordered Cymbalta after confirming with patient dosing.

## 2019-02-18 DIAGNOSIS — M25561 Pain in right knee: Secondary | ICD-10-CM | POA: Diagnosis not present

## 2019-02-18 DIAGNOSIS — M6281 Muscle weakness (generalized): Secondary | ICD-10-CM | POA: Diagnosis not present

## 2019-02-18 DIAGNOSIS — R262 Difficulty in walking, not elsewhere classified: Secondary | ICD-10-CM | POA: Diagnosis not present

## 2019-02-20 DIAGNOSIS — R262 Difficulty in walking, not elsewhere classified: Secondary | ICD-10-CM | POA: Diagnosis not present

## 2019-02-20 DIAGNOSIS — M6281 Muscle weakness (generalized): Secondary | ICD-10-CM | POA: Diagnosis not present

## 2019-02-20 DIAGNOSIS — M25561 Pain in right knee: Secondary | ICD-10-CM | POA: Diagnosis not present

## 2019-02-25 DIAGNOSIS — M25561 Pain in right knee: Secondary | ICD-10-CM | POA: Diagnosis not present

## 2019-02-25 DIAGNOSIS — M6281 Muscle weakness (generalized): Secondary | ICD-10-CM | POA: Diagnosis not present

## 2019-02-25 DIAGNOSIS — R262 Difficulty in walking, not elsewhere classified: Secondary | ICD-10-CM | POA: Diagnosis not present

## 2019-02-28 DIAGNOSIS — M25561 Pain in right knee: Secondary | ICD-10-CM | POA: Diagnosis not present

## 2019-02-28 DIAGNOSIS — R262 Difficulty in walking, not elsewhere classified: Secondary | ICD-10-CM | POA: Diagnosis not present

## 2019-02-28 DIAGNOSIS — M6281 Muscle weakness (generalized): Secondary | ICD-10-CM | POA: Diagnosis not present

## 2019-03-02 ENCOUNTER — Other Ambulatory Visit: Payer: Self-pay | Admitting: Family

## 2019-03-02 DIAGNOSIS — I10 Essential (primary) hypertension: Secondary | ICD-10-CM

## 2019-03-04 DIAGNOSIS — M6281 Muscle weakness (generalized): Secondary | ICD-10-CM | POA: Diagnosis not present

## 2019-03-04 DIAGNOSIS — M25561 Pain in right knee: Secondary | ICD-10-CM | POA: Diagnosis not present

## 2019-03-04 DIAGNOSIS — R262 Difficulty in walking, not elsewhere classified: Secondary | ICD-10-CM | POA: Diagnosis not present

## 2019-03-06 DIAGNOSIS — M25561 Pain in right knee: Secondary | ICD-10-CM | POA: Diagnosis not present

## 2019-03-06 DIAGNOSIS — R262 Difficulty in walking, not elsewhere classified: Secondary | ICD-10-CM | POA: Diagnosis not present

## 2019-03-06 DIAGNOSIS — I10 Essential (primary) hypertension: Secondary | ICD-10-CM | POA: Diagnosis not present

## 2019-03-06 DIAGNOSIS — I21A1 Myocardial infarction type 2: Secondary | ICD-10-CM | POA: Diagnosis not present

## 2019-03-06 DIAGNOSIS — I4819 Other persistent atrial fibrillation: Secondary | ICD-10-CM | POA: Diagnosis not present

## 2019-03-06 DIAGNOSIS — I5031 Acute diastolic (congestive) heart failure: Secondary | ICD-10-CM | POA: Diagnosis not present

## 2019-03-06 DIAGNOSIS — M6281 Muscle weakness (generalized): Secondary | ICD-10-CM | POA: Diagnosis not present

## 2019-03-06 DIAGNOSIS — J9601 Acute respiratory failure with hypoxia: Secondary | ICD-10-CM | POA: Diagnosis not present

## 2019-03-06 DIAGNOSIS — G9341 Metabolic encephalopathy: Secondary | ICD-10-CM | POA: Diagnosis not present

## 2019-03-06 DIAGNOSIS — A419 Sepsis, unspecified organism: Secondary | ICD-10-CM | POA: Diagnosis not present

## 2019-03-06 DIAGNOSIS — H25812 Combined forms of age-related cataract, left eye: Secondary | ICD-10-CM | POA: Diagnosis not present

## 2019-03-06 DIAGNOSIS — C8310 Mantle cell lymphoma, unspecified site: Secondary | ICD-10-CM | POA: Diagnosis not present

## 2019-03-06 DIAGNOSIS — G92 Toxic encephalopathy: Secondary | ICD-10-CM | POA: Diagnosis not present

## 2019-03-06 DIAGNOSIS — F331 Major depressive disorder, recurrent, moderate: Secondary | ICD-10-CM | POA: Diagnosis not present

## 2019-03-06 DIAGNOSIS — I4892 Unspecified atrial flutter: Secondary | ICD-10-CM | POA: Diagnosis not present

## 2019-03-06 DIAGNOSIS — Z20828 Contact with and (suspected) exposure to other viral communicable diseases: Secondary | ICD-10-CM | POA: Diagnosis not present

## 2019-03-06 DIAGNOSIS — Z66 Do not resuscitate: Secondary | ICD-10-CM | POA: Diagnosis not present

## 2019-03-06 DIAGNOSIS — Z515 Encounter for palliative care: Secondary | ICD-10-CM | POA: Diagnosis not present

## 2019-03-08 ENCOUNTER — Other Ambulatory Visit: Payer: Self-pay | Admitting: Family

## 2019-03-08 DIAGNOSIS — F339 Major depressive disorder, recurrent, unspecified: Secondary | ICD-10-CM

## 2019-03-09 ENCOUNTER — Other Ambulatory Visit: Payer: Self-pay | Admitting: Family

## 2019-03-11 NOTE — Telephone Encounter (Signed)
Call pt  I just refilled hisCymbalta, please confirm that is 30 mg as that is what I refilled

## 2019-03-12 ENCOUNTER — Telehealth: Payer: Self-pay

## 2019-03-12 DIAGNOSIS — J309 Allergic rhinitis, unspecified: Secondary | ICD-10-CM | POA: Diagnosis not present

## 2019-03-12 DIAGNOSIS — M6281 Muscle weakness (generalized): Secondary | ICD-10-CM | POA: Diagnosis not present

## 2019-03-12 DIAGNOSIS — D7589 Other specified diseases of blood and blood-forming organs: Secondary | ICD-10-CM | POA: Diagnosis not present

## 2019-03-12 DIAGNOSIS — R262 Difficulty in walking, not elsewhere classified: Secondary | ICD-10-CM | POA: Diagnosis not present

## 2019-03-12 DIAGNOSIS — E291 Testicular hypofunction: Secondary | ICD-10-CM | POA: Diagnosis not present

## 2019-03-12 DIAGNOSIS — M25561 Pain in right knee: Secondary | ICD-10-CM | POA: Diagnosis not present

## 2019-03-12 DIAGNOSIS — E785 Hyperlipidemia, unspecified: Secondary | ICD-10-CM | POA: Diagnosis not present

## 2019-03-12 NOTE — Telephone Encounter (Signed)
Copied from [place] [id number]. Topic: General - Inquiry >> Mar 12, 2019  2:30 PM Vernona Rieger wrote: Reason for CRM: pt would like to cancel his appt for 4/13 - declined virtual visit.

## 2019-03-13 ENCOUNTER — Telehealth: Payer: Self-pay

## 2019-03-13 NOTE — Telephone Encounter (Signed)
Copied from [place] [id number]. Topic: General - Inquiry >> Mar 12, 2019  2:30 PM Vernona Rieger wrote: Reason for CRM: pt would like to cancel his appt for 4/13 - declined virtual visit. >> Mar 13, 2019  3:50 PM Leward Quan A wrote: Patient returned a call for Judson Roch but no answer. Request a call back tomorrow  Ph# [phone]

## 2019-03-13 NOTE — Telephone Encounter (Signed)
LMTCB & also try to get patient to do a Doxy.me visit.

## 2019-03-14 DIAGNOSIS — I70212 Atherosclerosis of native arteries of extremities with intermittent claudication, left leg: Secondary | ICD-10-CM | POA: Diagnosis not present

## 2019-03-14 DIAGNOSIS — M25561 Pain in right knee: Secondary | ICD-10-CM | POA: Diagnosis not present

## 2019-03-14 DIAGNOSIS — R262 Difficulty in walking, not elsewhere classified: Secondary | ICD-10-CM | POA: Diagnosis not present

## 2019-03-14 DIAGNOSIS — I70222 Atherosclerosis of native arteries of extremities with rest pain, left leg: Secondary | ICD-10-CM | POA: Diagnosis not present

## 2019-03-14 DIAGNOSIS — M6281 Muscle weakness (generalized): Secondary | ICD-10-CM | POA: Diagnosis not present

## 2019-03-14 NOTE — Telephone Encounter (Signed)
Tried to call patient and offer a virual visit patient refused and demanded cancel appointment.

## 2019-03-18 ENCOUNTER — Ambulatory Visit: Payer: PPO | Admitting: Family

## 2019-03-18 DIAGNOSIS — I129 Hypertensive chronic kidney disease with stage 1 through stage 4 chronic kidney disease, or unspecified chronic kidney disease: Secondary | ICD-10-CM | POA: Diagnosis not present

## 2019-03-18 DIAGNOSIS — M6281 Muscle weakness (generalized): Secondary | ICD-10-CM | POA: Diagnosis not present

## 2019-03-18 DIAGNOSIS — R262 Difficulty in walking, not elsewhere classified: Secondary | ICD-10-CM | POA: Diagnosis not present

## 2019-03-18 DIAGNOSIS — M25561 Pain in right knee: Secondary | ICD-10-CM | POA: Diagnosis not present

## 2019-03-18 DIAGNOSIS — N183 Chronic kidney disease, stage 3 unspecified: Secondary | ICD-10-CM | POA: Diagnosis not present

## 2019-03-18 DIAGNOSIS — Z6841 Body Mass Index (BMI) 40.0 and over, adult: Secondary | ICD-10-CM | POA: Diagnosis not present

## 2019-03-18 DIAGNOSIS — G4733 Obstructive sleep apnea (adult) (pediatric): Secondary | ICD-10-CM | POA: Diagnosis not present

## 2019-03-18 DIAGNOSIS — R319 Hematuria, unspecified: Secondary | ICD-10-CM | POA: Diagnosis not present

## 2019-03-18 DIAGNOSIS — R3989 Other symptoms and signs involving the genitourinary system: Secondary | ICD-10-CM | POA: Diagnosis not present

## 2019-03-18 DIAGNOSIS — R05 Cough: Secondary | ICD-10-CM | POA: Diagnosis not present

## 2019-03-20 DIAGNOSIS — M4726 Other spondylosis with radiculopathy, lumbar region: Secondary | ICD-10-CM | POA: Diagnosis not present

## 2019-03-20 DIAGNOSIS — M5136 Other intervertebral disc degeneration, lumbar region: Secondary | ICD-10-CM | POA: Diagnosis not present

## 2019-03-20 DIAGNOSIS — J438 Other emphysema: Secondary | ICD-10-CM | POA: Diagnosis not present

## 2019-03-20 DIAGNOSIS — G8929 Other chronic pain: Secondary | ICD-10-CM | POA: Diagnosis not present

## 2019-03-20 DIAGNOSIS — I25810 Atherosclerosis of coronary artery bypass graft(s) without angina pectoris: Secondary | ICD-10-CM | POA: Diagnosis not present

## 2019-03-20 DIAGNOSIS — M6281 Muscle weakness (generalized): Secondary | ICD-10-CM | POA: Diagnosis not present

## 2019-03-20 DIAGNOSIS — E039 Hypothyroidism, unspecified: Secondary | ICD-10-CM | POA: Diagnosis not present

## 2019-03-20 DIAGNOSIS — R262 Difficulty in walking, not elsewhere classified: Secondary | ICD-10-CM | POA: Diagnosis not present

## 2019-03-20 DIAGNOSIS — M542 Cervicalgia: Secondary | ICD-10-CM | POA: Diagnosis not present

## 2019-03-20 DIAGNOSIS — Z6831 Body mass index (BMI) 31.0-31.9, adult: Secondary | ICD-10-CM | POA: Diagnosis not present

## 2019-03-20 DIAGNOSIS — I739 Peripheral vascular disease, unspecified: Secondary | ICD-10-CM | POA: Diagnosis not present

## 2019-03-20 DIAGNOSIS — M50322 Other cervical disc degeneration at C5-C6 level: Secondary | ICD-10-CM | POA: Diagnosis not present

## 2019-03-20 DIAGNOSIS — M25561 Pain in right knee: Secondary | ICD-10-CM | POA: Diagnosis not present

## 2019-03-20 DIAGNOSIS — I5022 Chronic systolic (congestive) heart failure: Secondary | ICD-10-CM | POA: Diagnosis not present

## 2019-03-20 DIAGNOSIS — E1159 Type 2 diabetes mellitus with other circulatory complications: Secondary | ICD-10-CM | POA: Diagnosis not present

## 2019-03-20 DIAGNOSIS — I10 Essential (primary) hypertension: Secondary | ICD-10-CM | POA: Diagnosis not present

## 2019-03-20 DIAGNOSIS — E6609 Other obesity due to excess calories: Secondary | ICD-10-CM | POA: Diagnosis not present

## 2019-03-20 DIAGNOSIS — Z79891 Long term (current) use of opiate analgesic: Secondary | ICD-10-CM | POA: Diagnosis not present

## 2019-03-20 DIAGNOSIS — I350 Nonrheumatic aortic (valve) stenosis: Secondary | ICD-10-CM | POA: Diagnosis not present

## 2019-03-26 DIAGNOSIS — M6281 Muscle weakness (generalized): Secondary | ICD-10-CM | POA: Diagnosis not present

## 2019-03-26 DIAGNOSIS — R262 Difficulty in walking, not elsewhere classified: Secondary | ICD-10-CM | POA: Diagnosis not present

## 2019-03-26 DIAGNOSIS — M25561 Pain in right knee: Secondary | ICD-10-CM | POA: Diagnosis not present

## 2019-03-28 DIAGNOSIS — M25561 Pain in right knee: Secondary | ICD-10-CM | POA: Diagnosis not present

## 2019-03-28 DIAGNOSIS — M6281 Muscle weakness (generalized): Secondary | ICD-10-CM | POA: Diagnosis not present

## 2019-03-28 DIAGNOSIS — R262 Difficulty in walking, not elsewhere classified: Secondary | ICD-10-CM | POA: Diagnosis not present

## 2019-04-02 ENCOUNTER — Other Ambulatory Visit: Payer: Self-pay | Admitting: Family

## 2019-04-02 DIAGNOSIS — I10 Essential (primary) hypertension: Secondary | ICD-10-CM

## 2019-04-02 DIAGNOSIS — F339 Major depressive disorder, recurrent, unspecified: Secondary | ICD-10-CM

## 2019-04-03 DIAGNOSIS — R262 Difficulty in walking, not elsewhere classified: Secondary | ICD-10-CM | POA: Diagnosis not present

## 2019-04-03 DIAGNOSIS — M25561 Pain in right knee: Secondary | ICD-10-CM | POA: Diagnosis not present

## 2019-04-03 DIAGNOSIS — M6281 Muscle weakness (generalized): Secondary | ICD-10-CM | POA: Diagnosis not present

## 2019-04-05 DIAGNOSIS — M25561 Pain in right knee: Secondary | ICD-10-CM | POA: Diagnosis not present

## 2019-04-05 DIAGNOSIS — M6281 Muscle weakness (generalized): Secondary | ICD-10-CM | POA: Diagnosis not present

## 2019-04-05 DIAGNOSIS — R262 Difficulty in walking, not elsewhere classified: Secondary | ICD-10-CM | POA: Diagnosis not present

## 2019-04-25 ENCOUNTER — Telehealth: Payer: Self-pay | Admitting: Family

## 2019-04-25 NOTE — Telephone Encounter (Signed)
Pt sent a msg wanting to get his A1c checked. Order is needed. Please and Thank you!  Lab is scheduled for 05/09/2019.

## 2019-04-25 NOTE — Telephone Encounter (Signed)
Pt also wanted to get a Pneumonia shot too.

## 2019-04-26 ENCOUNTER — Other Ambulatory Visit: Payer: Self-pay

## 2019-04-26 DIAGNOSIS — E119 Type 2 diabetes mellitus without complications: Secondary | ICD-10-CM

## 2019-04-26 NOTE — Telephone Encounter (Signed)
Pt had prevnar at 6 years ( 2015) And pneumo 23 prior to 14 yrs. ( 2013)  Since pneunovax 23 was prior to 66 , he will need get boosted one time after 66 years.

## 2019-04-26 NOTE — Telephone Encounter (Signed)
LMTCB to get patient scheduled for pneumonia 23.

## 2019-05-01 ENCOUNTER — Ambulatory Visit: Payer: PPO | Admitting: Family

## 2019-05-01 ENCOUNTER — Ambulatory Visit: Payer: PPO

## 2019-05-01 NOTE — Telephone Encounter (Signed)
Patient has made f/u appointment for July.

## 2019-05-04 ENCOUNTER — Other Ambulatory Visit: Payer: Self-pay | Admitting: Family

## 2019-05-09 ENCOUNTER — Ambulatory Visit: Payer: PPO

## 2019-05-09 ENCOUNTER — Other Ambulatory Visit: Payer: PPO

## 2019-05-10 DIAGNOSIS — M1712 Unilateral primary osteoarthritis, left knee: Secondary | ICD-10-CM | POA: Diagnosis not present

## 2019-05-10 DIAGNOSIS — M170 Bilateral primary osteoarthritis of knee: Secondary | ICD-10-CM | POA: Diagnosis not present

## 2019-05-13 ENCOUNTER — Telehealth: Payer: Self-pay | Admitting: Family

## 2019-05-13 NOTE — Telephone Encounter (Signed)
Call pt  Advised him for preop appointment in July for knee surgery, especially with his history of a right bundle branch block, I would advise to be IN person in case we need to do an EKG.  He need to be screened advised of our protocols here.

## 2019-05-14 NOTE — Telephone Encounter (Signed)
I spoke with patient to let him know that his appointment 7/20 will be in person as long as he is screened & symptom free. His surgery is scheduled for September.

## 2019-05-15 NOTE — Telephone Encounter (Signed)
noted 

## 2019-05-20 ENCOUNTER — Other Ambulatory Visit: Payer: Self-pay | Admitting: Family

## 2019-05-20 DIAGNOSIS — F339 Major depressive disorder, recurrent, unspecified: Secondary | ICD-10-CM

## 2019-06-08 ENCOUNTER — Other Ambulatory Visit: Payer: Self-pay | Admitting: Family

## 2019-06-20 ENCOUNTER — Other Ambulatory Visit: Payer: Self-pay

## 2019-06-20 DIAGNOSIS — D2262 Melanocytic nevi of left upper limb, including shoulder: Secondary | ICD-10-CM | POA: Diagnosis not present

## 2019-06-20 DIAGNOSIS — D2271 Melanocytic nevi of right lower limb, including hip: Secondary | ICD-10-CM | POA: Diagnosis not present

## 2019-06-20 DIAGNOSIS — D2272 Melanocytic nevi of left lower limb, including hip: Secondary | ICD-10-CM | POA: Diagnosis not present

## 2019-06-20 DIAGNOSIS — B351 Tinea unguium: Secondary | ICD-10-CM | POA: Diagnosis not present

## 2019-06-20 DIAGNOSIS — D225 Melanocytic nevi of trunk: Secondary | ICD-10-CM | POA: Diagnosis not present

## 2019-06-20 DIAGNOSIS — D2261 Melanocytic nevi of right upper limb, including shoulder: Secondary | ICD-10-CM | POA: Diagnosis not present

## 2019-06-24 ENCOUNTER — Encounter: Payer: Self-pay | Admitting: Family

## 2019-06-24 ENCOUNTER — Other Ambulatory Visit: Payer: Self-pay

## 2019-06-24 ENCOUNTER — Other Ambulatory Visit: Payer: Self-pay | Admitting: Family

## 2019-06-24 ENCOUNTER — Ambulatory Visit (INDEPENDENT_AMBULATORY_CARE_PROVIDER_SITE_OTHER): Payer: PPO | Admitting: Family

## 2019-06-24 VITALS — BP 130/66 | HR 67 | Temp 98.1°F | Wt 215.1 lb

## 2019-06-24 DIAGNOSIS — F339 Major depressive disorder, recurrent, unspecified: Secondary | ICD-10-CM | POA: Diagnosis not present

## 2019-06-24 DIAGNOSIS — Z01818 Encounter for other preprocedural examination: Secondary | ICD-10-CM | POA: Diagnosis not present

## 2019-06-24 DIAGNOSIS — I10 Essential (primary) hypertension: Secondary | ICD-10-CM

## 2019-06-24 DIAGNOSIS — N400 Enlarged prostate without lower urinary tract symptoms: Secondary | ICD-10-CM | POA: Diagnosis not present

## 2019-06-24 DIAGNOSIS — I4510 Unspecified right bundle-branch block: Secondary | ICD-10-CM | POA: Diagnosis not present

## 2019-06-24 DIAGNOSIS — M25561 Pain in right knee: Secondary | ICD-10-CM | POA: Diagnosis not present

## 2019-06-24 DIAGNOSIS — E119 Type 2 diabetes mellitus without complications: Secondary | ICD-10-CM | POA: Diagnosis not present

## 2019-06-24 DIAGNOSIS — E785 Hyperlipidemia, unspecified: Secondary | ICD-10-CM

## 2019-06-24 DIAGNOSIS — M25562 Pain in left knee: Secondary | ICD-10-CM | POA: Diagnosis not present

## 2019-06-24 DIAGNOSIS — G8929 Other chronic pain: Secondary | ICD-10-CM

## 2019-06-24 DIAGNOSIS — Z23 Encounter for immunization: Secondary | ICD-10-CM

## 2019-06-24 LAB — PSA: PSA: 0.43 ng/mL (ref 0.10–4.00)

## 2019-06-24 LAB — PROTIME-INR
INR: 1 ratio (ref 0.8–1.0)
Prothrombin Time: 11.7 s (ref 9.6–13.1)

## 2019-06-24 LAB — URINALYSIS, ROUTINE W REFLEX MICROSCOPIC
Bilirubin Urine: NEGATIVE
Hgb urine dipstick: NEGATIVE
Ketones, ur: NEGATIVE
Leukocytes,Ua: NEGATIVE
Nitrite: NEGATIVE
Specific Gravity, Urine: 1.02 (ref 1.000–1.030)
Total Protein, Urine: NEGATIVE
Urine Glucose: NEGATIVE
Urobilinogen, UA: 0.2 (ref 0.0–1.0)
pH: 6 (ref 5.0–8.0)

## 2019-06-24 LAB — CBC WITH DIFFERENTIAL/PLATELET
Basophils Absolute: 0 10*3/uL (ref 0.0–0.1)
Basophils Relative: 0.6 % (ref 0.0–3.0)
Eosinophils Absolute: 0.1 10*3/uL (ref 0.0–0.7)
Eosinophils Relative: 2 % (ref 0.0–5.0)
HCT: 39.9 % (ref 39.0–52.0)
Hemoglobin: 13.7 g/dL (ref 13.0–17.0)
Lymphocytes Relative: 20.2 % (ref 12.0–46.0)
Lymphs Abs: 1.4 10*3/uL (ref 0.7–4.0)
MCHC: 34.3 g/dL (ref 30.0–36.0)
MCV: 93.5 fl (ref 78.0–100.0)
Monocytes Absolute: 0.5 10*3/uL (ref 0.1–1.0)
Monocytes Relative: 6.9 % (ref 3.0–12.0)
Neutro Abs: 4.9 10*3/uL (ref 1.4–7.7)
Neutrophils Relative %: 70.3 % (ref 43.0–77.0)
Platelets: 296 10*3/uL (ref 150.0–400.0)
RBC: 4.26 Mil/uL (ref 4.22–5.81)
RDW: 12.3 % (ref 11.5–15.5)
WBC: 7 10*3/uL (ref 4.0–10.5)

## 2019-06-24 LAB — LIPID PANEL
Cholesterol: 112 mg/dL (ref 0–200)
HDL: 47.3 mg/dL (ref >39.00)
LDL Cholesterol: 36 mg/dL (ref 0–99)
NonHDL: 65.08
Total CHOL/HDL Ratio: 2
Triglycerides: 143 mg/dL (ref 0.0–149.0)
VLDL: 28.6 mg/dL (ref 0.0–40.0)

## 2019-06-24 LAB — COMPREHENSIVE METABOLIC PANEL
ALT: 20 U/L (ref 0–53)
AST: 18 U/L (ref 0–37)
Albumin: 4.7 g/dL (ref 3.5–5.2)
Alkaline Phosphatase: 62 U/L (ref 39–117)
BUN: 16 mg/dL (ref 6–23)
CO2: 30 mEq/L (ref 19–32)
Calcium: 10 mg/dL (ref 8.4–10.5)
Chloride: 98 mEq/L (ref 96–112)
Creatinine, Ser: 0.96 mg/dL (ref 0.40–1.50)
GFR: 77 mL/min (ref >60.00)
Glucose, Bld: 106 mg/dL — ABNORMAL HIGH (ref 70–99)
Potassium: 4.1 mEq/L (ref 3.5–5.1)
Sodium: 138 mEq/L (ref 135–145)
Total Bilirubin: 0.9 mg/dL (ref 0.2–1.2)
Total Protein: 6.9 g/dL (ref 6.0–8.3)

## 2019-06-24 LAB — HEMOGLOBIN A1C: Hgb A1c MFr Bld: 6.4 % (ref 4.6–6.5)

## 2019-06-24 MED ORDER — DULOXETINE HCL 30 MG PO CPEP: 30.0000 mg | ORAL_CAPSULE | Freq: Two times a day (BID) | ORAL | 1 refills | Status: DC

## 2019-06-24 NOTE — Assessment & Plan Note (Signed)
Pending a1c. 

## 2019-06-24 NOTE — Progress Notes (Signed)
Subjective:    Patient ID: Larry Roman., male    DOB: 1946-07-12, 73 y.o.   MRN: [id number]  CC: Larry Roman. is a 73 y.o. male who presents today for follow up.   HPI: Here for medical clearance for left knee surgery.  Feels well. No Complaints.  HTN-compliant medications.  No chest pain, palpitation  GERD- well controlled on on tagamet.  No trouble or pain with swallowing.  No regurgitation  DM- Compliant with metformin  Depression- on Cymbalta, trazodone which is helpful.  Every once in a while, he feels that he needs to increase his Cymbalta 60 mg if he begins to feel his depression worsening as he did recently during pandemic.  He may do this for couple weeks and then return to 30 mg. He doesn't want to be on 60mg  QD all the time. No si/hi.   BPH-some hesitancy with urinary stream.  Also is not sure if he will empties his bladder.  No dysuria.  Compliant with Flomax  H/o RBB   H/o right knee arthoplasty 09/2018.    Dr Larry Roman Needs Pneumococcal 23 HISTORY:  Past Medical History:  Diagnosis Date  . Depression   . DM (diabetes mellitus) ([place])   . Extrinsic asthma, unspecified    childhood  . Hives of unknown origin   . HTN (hypertension)   . Other allergy, other than to medicinal agents   . RBBB   . Sleep apnea    25 years ago mild lost weight no longer uses cpap  . Wound of right leg 08/2018   area just below knee size of quarter, reddened around edges   Past Surgical History:  Procedure Laterality Date  . BACK SURGERY  1995   ruptured disc encapsulated nerves  . CATARACT EXTRACTION, BILATERAL  2016   Dr[facility] Kerman Roman at [facility]  . EYE SURGERY Bilateral    cataract extractions  . TONSILLECTOMY    . TOTAL KNEE ARTHROPLASTY Right 09/27/2018   Procedure: TOTAL KNEE ARTHROPLASTY;  Surgeon: Larry Park, MD;  Location: [facility] ORS;  Service: Orthopedics;  Laterality: Right;   Family History  Problem Relation Age of Onset  . Heart failure  Father   . Heart attack Mother     Allergies: Ibuprofen Current Outpatient Medications on File Prior to Visit  Medication Sig Dispense Refill  . ACCU-CHEK SOFTCLIX LANCETS lancets Use up to 4 times daily to check blood sugar. Diagnosis E11.9 100 each 12  . acetaminophen (TYLENOL) 650 MG CR tablet Take 1,300 mg by mouth 2 (two) times daily.     Marland Kitchen amLODipine (NORVASC) 10 MG tablet Take 1 tablet (10 mg total) by mouth daily with lunch. 90 tablet 1  . aspirin EC 81 MG tablet Take 81 mg by mouth daily.      . Azelastine HCl 137 MCG/SPRAY SOLN Place 1 spray into both nostrils 2 (two) times daily. 1 Bottle 0  . Cholecalciferol (VITAMIN D3) 2000 UNITS TABS Take 2,000 Units by mouth daily.     . cimetidine (TAGAMET HB) 200 MG tablet Take 200 mg by mouth every other day.    . Cyanocobalamin (B-12) 5000 MCG SUBL Place 5,000 mcg under the tongue daily.     Marland Kitchen docusate sodium (STOOL SOFTENER) 100 MG capsule Take 100 mg by mouth daily.    Marland Kitchen glucose blood (ONETOUCH VERIO) test strip TEST 3 TIMES A DAY 100 each 0  . hydrochlorothiazide (MICROZIDE) 12.5 MG capsule TAKE 1 CAPSULE BY  MOUTH EVERY DAY 90 capsule 0  . loratadine (CLARITIN) 10 MG tablet Take 10 mg by mouth daily.    Marland Kitchen losartan (COZAAR) 100 MG tablet TAKE 1 TABLET BY MOUTH EVERY DAY 90 tablet 3  . metFORMIN (GLUCOPHAGE) 500 MG tablet TAKE 1 TABLET (500 MG TOTAL) BY MOUTH 2 (TWO) TIMES DAILY WITH A MEAL. 60 tablet 2  . metoprolol succinate (TOPROL-XL) 25 MG 24 hr tablet TAKE 1 TABLET BY MOUTH EVERY DAY 90 tablet 0  . NON FORMULARY Diet Type: NCS    . pravastatin (PRAVACHOL) 40 MG tablet Take 1 tablet (40 mg total) by mouth daily. 90 tablet 0  . tamsulosin (FLOMAX) 0.4 MG CAPS capsule TAKE 1 CAPSULE BY MOUTH EVERY DAY 90 capsule 1  . traZODone (DESYREL) 50 MG tablet TAKE 1 TABLET BY MOUTH AT BEDTIME AS NEEDED FOR SLEEP 90 tablet 0   No current facility-administered medications on file prior to visit.     Social History   Tobacco Use  . Smoking  status: Never Smoker  . Smokeless tobacco: Never Used  . Tobacco comment: tobacco use - no  Substance Use Topics  . Alcohol use: Yes    Alcohol/week: 0.0 standard drinks    Comment: rare  . Drug use: No    Review of Systems  Constitutional: Negative for chills and fever.  Respiratory: Negative for cough.   Cardiovascular: Negative for chest pain and palpitations.  Gastrointestinal: Negative for nausea and vomiting.  Genitourinary: Positive for difficulty urinating. Negative for hematuria.  Musculoskeletal: Positive for arthralgias (left knee).      Objective:    BP 130/66   Pulse 67   Temp 98.1 F (36.7 C)   Wt 215 lb 1.9 oz (97.6 kg)   SpO2 97%   BMI 30.00 kg/m  BP Readings from Last 3 Encounters:  06/24/19 130/66  12/17/18 (!) 118/58  10/15/18 115/72   Wt Readings from Last 3 Encounters:  06/24/19 215 lb 1.9 oz (97.6 kg)  12/17/18 211 lb 6.4 oz (95.9 kg)  10/15/18 207 lb (93.9 kg)    Physical Exam Vitals signs reviewed.  Constitutional:      Appearance: He is well-developed.  Cardiovascular:     Rate and Rhythm: Regular rhythm.     Heart sounds: Normal heart sounds.  Pulmonary:     Effort: Pulmonary effort is normal. No respiratory distress.     Breath sounds: Normal breath sounds. No wheezing, rhonchi or rales.  Skin:    General: Skin is warm and dry.  Neurological:     Mental Status: He is alert.  Psychiatric:        Speech: Speech normal.        Behavior: Behavior normal.        Assessment & Plan:   Problem List Items Addressed This Visit      Cardiovascular and Mediastinum   Hypertension    Overall controlled, continue current regimen      Relevant Orders   Comprehensive metabolic panel (Completed)   Lipid panel (Completed)   Right bundle branch block    No acute changes seen on EKG today.   When compared to prior EKG of September 2019, no significant changes.  Right bundle branch block. Advised that I will consult with Larry Roman in regards if he should pursue cardiac consult prior to the replacement.        Endocrine   Diabetes mellitus type 2, controlled ([place])    Pending a1c  Relevant Orders   Hemoglobin A1c (Completed)     Genitourinary   BPH (benign prostatic hyperplasia)    Concern for worsening symptoms.  Pending PSA, urology consult.  Will follow      Relevant Orders   PSA (Completed)   Ambulatory referral to Urology     Other   Depression, recurrent ([place])    Advised patient that switching so quickly from Cymbalta 30 mg to 60 mg a day may not be effective.  However he is adamant he does not want increase 60 mg continuously.  Advised to slowly wean off the 60 to 30 mg daily if that is preference.  He verbalized understanding      Relevant Medications   DULoxetine (CYMBALTA) 30 MG capsule   Chronic pain of both knees     preop form to be completed once labs resulted and I have sought  advicel from Dr. Rockey Situ ( h/o RBB).       Relevant Medications   DULoxetine (CYMBALTA) 30 MG capsule    Other Visit Diagnoses    Pre-operative clearance    -  Primary   Relevant Orders   EKG 12-Lead (Completed)   Comprehensive metabolic panel (Completed)   Lipid panel (Completed)   CBC with Differential/Platelet (Completed)   Protime-INR (Completed)   Urinalysis, Routine w reflex microscopic (Completed)   Hemoglobin A1c (Completed)   Need for pneumococcal vaccination           I have discontinued Chancy Hurter Jr.'s ranitidine and oxyCODONE. I have also changed his DULoxetine. Additionally, I am having him maintain his aspirin EC, loratadine, Vitamin D3, B-12, Accu-Chek Softclix Lancets, acetaminophen, NON FORMULARY, Azelastine HCl, glucose blood, pravastatin, amLODipine, tamsulosin, metFORMIN, metoprolol succinate, hydrochlorothiazide, traZODone, losartan, cimetidine, and docusate sodium.   Meds ordered this encounter  Medications  . DULoxetine (CYMBALTA) 30 MG capsule    Sig: Take 1  capsule (30 mg total) by mouth 2 (two) times daily.    Dispense:  120 capsule    Refill:  1    Order Specific Question:   Supervising Provider    Answer:   Crecencio Mc [[id number]]    Return precautions given.   Risks, benefits, and alternatives of the medications and treatment plan prescribed today were discussed, and patient expressed understanding.   Education regarding symptom management and diagnosis given to patient on AVS.  Continue to follow with Burnard Hawthorne, FNP for routine health maintenance.   [place] agreed with plan.   Mable Paris, FNP

## 2019-06-24 NOTE — Assessment & Plan Note (Signed)
Advised patient that switching so quickly from Cymbalta 30 mg to 60 mg a day may not be effective.  However he is adamant he does not want increase 60 mg continuously.  Advised to slowly wean off the 60 to 30 mg daily if that is preference.  He verbalized understanding

## 2019-06-24 NOTE — Assessment & Plan Note (Signed)
Overall controlled, continue current regimen

## 2019-06-24 NOTE — Assessment & Plan Note (Signed)
preop form to be completed once labs resulted and I have sought  advicel from Dr. Rockey Situ ( h/o RBB).

## 2019-06-24 NOTE — Assessment & Plan Note (Signed)
Concern for worsening symptoms.  Pending PSA, urology consult.  Will follow

## 2019-06-24 NOTE — Assessment & Plan Note (Signed)
No acute changes seen on EKG today.   When compared to prior EKG of September 2019, no significant changes.  Right bundle branch block. Advised that I will consult with Dr. Ida Rogue in regards if he should pursue cardiac consult prior to the replacement.

## 2019-06-24 NOTE — Patient Instructions (Signed)
Will await labs and also reviewing EKG with Dr Rockey Situ in regards to Right bundle brach block to ensure he doesn't want an consult in person.  Please call the office in week if you do not hear from [place] regarding the above so we can complete your pre op form.   Today we discussed referrals, orders. urology  I have placed these orders in the system for you.  Please be sure to give [place] a call if you have not heard from our office regarding this. We should hear from [place] within ONE week with information regarding your appointment. If not, please let me know immediately.    Stay safe!

## 2019-06-25 DIAGNOSIS — M25561 Pain in right knee: Secondary | ICD-10-CM | POA: Diagnosis not present

## 2019-06-25 DIAGNOSIS — E119 Type 2 diabetes mellitus without complications: Secondary | ICD-10-CM | POA: Diagnosis not present

## 2019-06-25 DIAGNOSIS — F339 Major depressive disorder, recurrent, unspecified: Secondary | ICD-10-CM | POA: Diagnosis not present

## 2019-06-25 DIAGNOSIS — I4510 Unspecified right bundle-branch block: Secondary | ICD-10-CM | POA: Diagnosis not present

## 2019-06-25 DIAGNOSIS — M25562 Pain in left knee: Secondary | ICD-10-CM | POA: Diagnosis not present

## 2019-06-25 DIAGNOSIS — N400 Enlarged prostate without lower urinary tract symptoms: Secondary | ICD-10-CM | POA: Diagnosis not present

## 2019-06-25 DIAGNOSIS — Z23 Encounter for immunization: Secondary | ICD-10-CM | POA: Diagnosis not present

## 2019-06-25 DIAGNOSIS — G8929 Other chronic pain: Secondary | ICD-10-CM | POA: Diagnosis not present

## 2019-06-25 DIAGNOSIS — Z01818 Encounter for other preprocedural examination: Secondary | ICD-10-CM | POA: Diagnosis not present

## 2019-06-25 DIAGNOSIS — I10 Essential (primary) hypertension: Secondary | ICD-10-CM | POA: Diagnosis not present

## 2019-06-25 NOTE — Progress Notes (Signed)
I have documented in chart. Sorry about that!

## 2019-06-25 NOTE — Addendum Note (Signed)
Addended by: Dorna Leitz on: 06/25/2019 10:25 AM   Modules accepted: Orders

## 2019-06-26 ENCOUNTER — Telehealth: Payer: Self-pay | Admitting: Family

## 2019-06-26 NOTE — Telephone Encounter (Signed)
He had a strange fax from [employer] regards to  this patient  (on your desk).  It appears a request for medical records.  We would need patient to consent for this.  It looks like eye exam is due which patient is aware of

## 2019-06-28 NOTE — Telephone Encounter (Signed)
It appears fax is just wanting OV notes etc to cover HTA gaps. I have sent latest OV notes.

## 2019-07-01 ENCOUNTER — Telehealth: Payer: Self-pay | Admitting: Family

## 2019-07-01 NOTE — Telephone Encounter (Signed)
Spoke with patient  Advised that I will send over surgery clearance He declines cardiology consult at this time He feels exercise tolerance is unchanged.   Advised him that he is able to stop ASA 81mg  for surgery however I would defer to Dr Mack Guise in regards to when to stop and start back. Advised to ensure he resumes medication as soon as he can as he is on asa to prevent risk of CVA/MI.   He will also trial pravachol QOD as cholesterol is on low end. We will recheck in a couple of months.    Patient verbalized understanding of all

## 2019-07-01 NOTE — Telephone Encounter (Signed)
Call pt  I received a note from Dr Rockey Situ, cardiology.   He had no acute concerns with EKG however he did not that years ago yall had decided not to have a stress test as you had good exercise tolerance.   If he would like to speak with Dr Rockey Situ in regards to further cardiac testing ( stress test) , we can make an appointment.   If he feels his exercise endurance hasnt changed over the past few years and certainly doesn't have any exertional chest pain or pressure, numbness or tingling radiating to left arm or jaw, palpitations, dizziness, frequent headaches, changes in vision, or shortness of breath- then we can proceed with surgery without cardiology consult  Please sure he knows the inherent risks of not having stress test as that can identify coronary heart disease.   Let me know his thoughts.    FROM [place]:  I think we talked about a stress test but did not pursue it at the time as he had good exercise tolerance with no angina, relatively benign EKG  Otherwise 7 years ago he did not have any active cardiac issues going on  Typically I think if he has reasonable exercise tolerance no new cardiac issues, no symptoms concerning for angina, blood pressure reasonable, no further testing would be needed and should be acceptable risk for surgery  Happy to see him if anesthesia feels it is needed

## 2019-07-01 NOTE — Telephone Encounter (Signed)
I spoke with patient & notified him of below. He stated that if he felt further testing was needed he would contact Dr. Donivan Scull office.

## 2019-07-20 ENCOUNTER — Other Ambulatory Visit: Payer: Self-pay | Admitting: Family

## 2019-07-20 DIAGNOSIS — N400 Enlarged prostate without lower urinary tract symptoms: Secondary | ICD-10-CM

## 2019-07-20 DIAGNOSIS — I10 Essential (primary) hypertension: Secondary | ICD-10-CM

## 2019-07-30 ENCOUNTER — Ambulatory Visit: Payer: Self-pay | Admitting: Urology

## 2019-08-02 ENCOUNTER — Telehealth: Payer: Self-pay | Admitting: *Deleted

## 2019-08-02 DIAGNOSIS — M1712 Unilateral primary osteoarthritis, left knee: Secondary | ICD-10-CM | POA: Diagnosis not present

## 2019-08-02 NOTE — Telephone Encounter (Signed)
Copied from [place] [phone]. Topic: General - Inquiry >> Aug 02, 2019  2:44 PM Richardo Priest, NT wrote: Reason for CRM: Patient wanted to inform PCP he has a surgery planned and is requesting to have medication for gout before surgery due to having a flare up previously. Patient stated surgeon is one that suggested it. Please advise. Call back is [phone].

## 2019-08-05 ENCOUNTER — Other Ambulatory Visit: Payer: Self-pay | Admitting: Orthopedic Surgery

## 2019-08-05 ENCOUNTER — Encounter: Payer: Self-pay | Admitting: *Deleted

## 2019-08-05 NOTE — Telephone Encounter (Signed)
Patient is calling to follow up on request from Friday. Patient is requesting Allopurinol  Preferred Pharmacy-[facility] [address] Lorina Rabon [place]

## 2019-08-05 NOTE — Telephone Encounter (Signed)
Patient stated that he has only had gout once after surgery last year. He has appointment scheduled for 10:30 Wednesday to discuss & have uric acid lab drawn.

## 2019-08-05 NOTE — Telephone Encounter (Signed)
Call pt I was unaware of h/o gout.  I would advise that we check uric acid prior to starting allopurinol and certainly a conversation regarding this ,symptoms , side effects of medication etc  He may make in person appt so we can get labs for gout.

## 2019-08-07 ENCOUNTER — Ambulatory Visit (INDEPENDENT_AMBULATORY_CARE_PROVIDER_SITE_OTHER): Payer: PPO | Admitting: Family

## 2019-08-07 ENCOUNTER — Other Ambulatory Visit: Payer: Self-pay

## 2019-08-07 ENCOUNTER — Encounter: Payer: Self-pay | Admitting: Family

## 2019-08-07 VITALS — BP 128/78 | HR 82 | Temp 97.3°F | Wt 219.8 lb

## 2019-08-07 DIAGNOSIS — I10 Essential (primary) hypertension: Secondary | ICD-10-CM | POA: Diagnosis not present

## 2019-08-07 DIAGNOSIS — Z8739 Personal history of other diseases of the musculoskeletal system and connective tissue: Secondary | ICD-10-CM

## 2019-08-07 LAB — URIC ACID: Uric Acid, Serum: 6.6 mg/dL (ref 4.0–7.8)

## 2019-08-07 MED ORDER — ALLOPURINOL 100 MG PO TABS: 100.0000 mg | ORAL_TABLET | Freq: Every day | ORAL | 1 refills | Status: DC

## 2019-08-07 NOTE — Assessment & Plan Note (Signed)
History of gout last year.  Discussed with patient that typically use allopurinol as a presurgical medication a refill of his uric acid and certainly think it is reasonable to do a short course.  Advised him to start a few days prior to surgery and he may continue for couple days after. I  Do not think he needs to be on this medication long-term as he does not have recurrent gout flares unless uric acid comes back elevated

## 2019-08-07 NOTE — Assessment & Plan Note (Signed)
Blood pressure improved with patient resting in room.  I do agree that likely pain and certainly walking in to the office contributory to elevated blood pressure.  Continue to monitor at this time.  We also discussed the nature of his blood pressure medications including hydrochlorothiazide as it may elevate uric acid.  He is on a low dose therefore I think that is less likely.  He is also on losartan, amlodipine which can be beneficial and gout flares.  We jointly agreed we will not change medication regimen at this time and continue to closely follow and discuss at future visits

## 2019-08-07 NOTE — Progress Notes (Signed)
Subjective:    Patient ID: Larry Ramus., male    DOB: [birth year], 73 y.o.   MRN: [id number]  CC: Larry Rigor. is a 73 y.o. male who presents today for follow up.   HPI: Here to start medication to prevent gout flare ahead of surgery.  Planning left knee total replacement in approximately a week with Dr. Mack Guise.  his surgeon asked him to start allopurinol.  States h/o gout while in rehab after right knee replacement last year 09/2018, it presented in his left ankle and he was given colchicine at that time with resolve.  So painful and would like to avoid recurrent flares. No history of kidney disease.   Hypertension-compliant with hydrochlorothiazide, losartan and amlodipine.  Believes that pain is increasing his blood pressure     HISTORY:  Past Medical History:  Diagnosis Date  . Depression   . DM (diabetes mellitus) ([place])   . Extrinsic asthma, unspecified    childhood  . Hives of unknown origin   . HTN (hypertension)   . Other allergy, other than to medicinal agents   . RBBB   . Sleep apnea    25 years ago mild lost weight no longer uses cpap  . Wound of right leg 08/2018   area just below knee size of quarter, reddened around edges   Past Surgical History:  Procedure Laterality Date  . BACK SURGERY  1995   ruptured disc encapsulated nerves  . CATARACT EXTRACTION, BILATERAL  2016   Dr. Kerman Passey at [facility]  . EYE SURGERY Bilateral    cataract extractions  . TONSILLECTOMY    . 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 Heart failure Father   . Heart attack Mother     Allergies: Ibuprofen Current Outpatient Medications on File Prior to Visit  Medication Sig Dispense Refill  . ACCU-CHEK SOFTCLIX LANCETS lancets Use up to 4 times daily to check blood  sugar. Diagnosis E11.9 100 each 12  . acetaminophen (TYLENOL) 500 MG tablet Take 1,500 mg by mouth 2 (two) times daily as needed for moderate pain.    Marland Kitchen amLODipine (NORVASC) 10 MG tablet TAKE 1 TABLET (10 MG TOTAL) BY MOUTH DAILY WITH LUNCH. 90 tablet 1  . aspirin EC 81 MG tablet Take 81 mg by mouth daily.      . Cholecalciferol (VITAMIN D3) 2000 UNITS TABS Take 2,000 Units by mouth daily.     . cimetidine (TAGAMET HB) 200 MG tablet Take 200 mg by mouth every Monday, Wednesday, and Friday.     . Cyanocobalamin (B-12) 5000 MCG SUBL Place 5,000 mcg under the tongue daily.     Marland Kitchen docusate sodium (COLACE) 50 MG capsule Take 50 mg by mouth daily.    . DULoxetine (CYMBALTA) 30 MG capsule Take 1 capsule (30 mg total) by mouth 2 (two) times daily. 120 capsule 1  . glucose blood (ONETOUCH VERIO) test strip TEST 3 TIMES A DAY 100 each 0  . hydrochlorothiazide (MICROZIDE) 12.5 MG capsule TAKE 1 CAPSULE BY MOUTH EVERY DAY (Patient taking differently: Take 12.5 mg by mouth daily. ) 90 capsule 0  . loratadine (CLARITIN) 10 MG tablet Take 10 mg by mouth daily.    Marland Kitchen losartan (COZAAR) 100 MG tablet TAKE 1 TABLET BY MOUTH EVERY DAY (Patient taking  differently: Take 100 mg by mouth daily. ) 90 tablet 3  . metFORMIN (GLUCOPHAGE) 500 MG tablet TAKE 1 TABLET (500 MG TOTAL) BY MOUTH 2 (TWO) TIMES DAILY WITH A MEAL. 180 tablet 0  . metoprolol succinate (TOPROL-XL) 25 MG 24 hr tablet TAKE 1 TABLET BY MOUTH EVERY DAY (Patient taking differently: Take 25 mg by mouth daily. ) 90 tablet 0  . NON FORMULARY Diet Type: NCS    . pravastatin (PRAVACHOL) 40 MG tablet TAKE 1 TABLET BY MOUTH EVERY DAY (Patient taking differently: Take 40 mg by mouth every Monday, Wednesday, and Friday. ) 90 tablet 0  . tamsulosin (FLOMAX) 0.4 MG CAPS capsule TAKE 1 CAPSULE BY MOUTH EVERY DAY (Patient taking differently: Take 0.4 mg by mouth daily at 12 noon. ) 90 capsule 1  . traZODone (DESYREL) 50 MG tablet TAKE 1 TABLET BY MOUTH AT BEDTIME AS NEEDED  FOR SLEEP (Patient taking differently: Take 50 mg by mouth at bedtime. ) 90 tablet 0  . Azelastine HCl 137 MCG/SPRAY SOLN Place 1 spray into both nostrils 2 (two) times daily. (Patient taking differently: Place 1 spray into both nostrils 2 (two) times daily as needed (allergies). ) 1 Bottle 0  . hydrocortisone cream 1 % Apply 1 application topically daily as needed for itching.     No current facility-administered medications on file prior to visit.     Social History   Tobacco Use  . Smoking status: Never Smoker  . Smokeless tobacco: Never Used  . Tobacco comment: tobacco use - no  Substance Use Topics  . Alcohol use: Yes    Alcohol/week: 0.0 standard drinks    Comment: rare  . Drug use: No    Review of Systems  Constitutional: Negative for chills and fever.  Respiratory: Negative for cough.   Cardiovascular: Negative for chest pain and palpitations.  Gastrointestinal: Negative for nausea and vomiting.  Musculoskeletal: Positive for arthralgias (left knee, chronic).      Objective:    BP 128/78   Pulse 82   Temp (!) 97.3 F (36.3 C) (Temporal)   Wt 219 lb 12.8 oz (99.7 kg)   SpO2 95%   BMI 30.66 kg/m  BP Readings from Last 3 Encounters:  08/07/19 128/78  06/24/19 130/66  12/17/18 (!) 118/58   Wt Readings from Last 3 Encounters:  08/07/19 219 lb 12.8 oz (99.7 kg)  06/24/19 215 lb 1.9 oz (97.6 kg)  12/17/18 211 lb 6.4 oz (95.9 kg)    Physical Exam Vitals signs reviewed.  Constitutional:      Appearance: He is well-developed.  Cardiovascular:     Rate and Rhythm: Regular rhythm.     Heart sounds: Normal heart sounds.  Pulmonary:     Effort: Pulmonary effort is normal. No respiratory distress.     Breath sounds: Normal breath sounds. No wheezing, rhonchi or rales.  Skin:    General: Skin is warm and dry.  Neurological:     Mental Status: He is alert.  Psychiatric:        Speech: Speech normal.        Behavior: Behavior normal.        Assessment &  Plan:   Problem List Items Addressed This Visit      Cardiovascular and Mediastinum   Hypertension    Blood pressure improved with patient resting in room.  I do agree that likely pain and certainly walking in to the office contributory to elevated blood pressure.  Continue to monitor at  this time.  We also discussed the nature of his blood pressure medications including hydrochlorothiazide as it may elevate uric acid.  He is on a low dose therefore I think that is less likely.  He is also on losartan, amlodipine which can be beneficial and gout flares.  We jointly agreed we will not change medication regimen at this time and continue to closely follow and discuss at future visits        Other   History of gout - Primary    History of gout last year.  Discussed with patient that typically use allopurinol as a presurgical medication a refill of his uric acid and certainly think it is reasonable to do a short course.  Advised him to start a few days prior to surgery and he may continue for couple days after. I  Do not think he needs to be on this medication long-term as he does not have recurrent gout flares unless uric acid comes back elevated      Relevant Medications   allopurinol (ZYLOPRIM) 100 MG tablet   Other Relevant Orders   Uric acid       I am having Larry Ramus. start on allopurinol. I am also having him maintain his aspirin EC, loratadine, Vitamin D3, B-12, Accu-Chek Softclix Lancets, NON FORMULARY, Azelastine HCl, glucose blood, traZODone, losartan, cimetidine, DULoxetine, metFORMIN, metoprolol succinate, pravastatin, hydrochlorothiazide, amLODipine, tamsulosin, docusate sodium, acetaminophen, and hydrocortisone cream.   Meds ordered this encounter  Medications  . allopurinol (ZYLOPRIM) 100 MG tablet    Sig: Take 1 tablet (100 mg total) by mouth daily.    Dispense:  30 tablet    Refill:  1    Order Specific Question:   Supervising Provider    Answer:   Crecencio Mc [[id number]]    Return precautions given.   Risks, benefits, and alternatives of the medications and treatment plan prescribed today were discussed, and patient expressed understanding.   Education regarding symptom management and diagnosis given to patient on AVS.  Continue to follow with Burnard Hawthorne, FNP for routine health maintenance.   [place] agreed with plan.   Mable Paris, FNP

## 2019-08-07 NOTE — Patient Instructions (Signed)
Make follow up for late December  Start allopurinol couple of days prior to surgery and stay a couple of days after  Will call with uric level  Stay safe!   Gout  Gout is a condition that causes painful swelling of the joints. Gout is a type of inflammation of the joints (arthritis). This condition is caused by having too much uric acid in the body. Uric acid is a chemical that forms when the body breaks down substances called purines. Purines are important for building body proteins. When the body has too much uric acid, sharp crystals can form and build up inside the joints. This causes pain and swelling. Gout attacks can happen quickly and may be very painful (acute gout). Over time, the attacks can affect more joints and become more frequent (chronic gout). Gout can also cause uric acid to build up under the skin and inside the kidneys. What are the causes? This condition is caused by too much uric acid in your blood. This can happen because:  Your kidneys do not remove enough uric acid from your blood. This is the most common cause.  Your body makes too much uric acid. This can happen with some cancers and cancer treatments. It can also occur if your body is breaking down too many red blood cells (hemolytic anemia).  You eat too many foods that are high in purines. These foods include organ meats and some seafood. Alcohol, especially beer, is also high in purines. A gout attack may be triggered by trauma or stress. What increases the risk? You are more likely to develop this condition if you:  Have a family history of gout.  Are male and middle-aged.  Are male and have gone through menopause.  Are obese.  Frequently drink alcohol, especially beer.  Are dehydrated.  Lose weight too quickly.  Have an organ transplant.  Have lead poisoning.  Take certain medicines, including aspirin, cyclosporine, diuretics, levodopa, and niacin.  Have kidney disease.  Have a skin  condition called psoriasis. What are the signs or symptoms? An attack of acute gout happens quickly. It usually occurs in just one joint. The most common place is the big toe. Attacks often start at night. Other joints that may be affected include joints of the feet, ankle, knee, fingers, wrist, or elbow. Symptoms of this condition may include:  Severe pain.  Warmth.  Swelling.  Stiffness.  Tenderness. The affected joint may be very painful to touch.  Shiny, red, or purple skin.  Chills and fever. Chronic gout may cause symptoms more frequently. More joints may be involved. You may also have white or yellow lumps (tophi) on your hands or feet or in other areas near your joints. How is this diagnosed? This condition is diagnosed based on your symptoms, medical history, and physical exam. You may have tests, such as:  Blood tests to measure uric acid levels.  Removal of joint fluid with a thin needle (aspiration) to look for uric acid crystals.  X-rays to look for joint damage. How is this treated? Treatment for this condition has two phases: treating an acute attack and preventing future attacks. Acute gout treatment may include medicines to reduce pain and swelling, including:  NSAIDs.  Steroids. These are strong anti-inflammatory medicines that can be taken by mouth (orally) or injected into a joint.  Colchicine. This medicine relieves pain and swelling when it is taken soon after an attack. It can be given by mouth or through an IV. Preventive  treatment may include:  Daily use of smaller doses of NSAIDs or colchicine.  Use of a medicine that reduces uric acid levels in your blood.  Changes to your diet. You may need to see a dietitian about what to eat and drink to prevent gout. Follow these instructions at home: During a gout attack   If directed, put ice on the affected area: ? Put ice in a plastic bag. ? Place a towel between your skin and the bag. ? Leave the ice  on for 20 minutes, 2-3 times a day.  Raise (elevate) the affected joint above the level of your heart as often as possible.  Rest the joint as much as possible. If the affected joint is in your leg, you may be given crutches to use.  Follow instructions from your health care provider about eating or drinking restrictions. Avoiding future gout attacks  Follow a low-purine diet as told by your dietitian or health care provider. Avoid foods and drinks that are high in purines, including liver, kidney, anchovies, asparagus, herring, mushrooms, mussels, and beer.  Maintain a healthy weight or lose weight if you are overweight. If you want to lose weight, talk with your health care provider. It is important that you do not lose weight too quickly.  Start or maintain an exercise program as told by your health care provider. Eating and drinking  Drink enough fluids to keep your urine pale yellow.  If you drink alcohol: ? Limit how much you use to:  0-1 drink a day for women.  0-2 drinks a day for men. ? Be aware of how much alcohol is in your drink. In the [place] one drink equals one 12 oz bottle of beer (355 mL) one 5 oz glass of wine (148 mL), or one 1 oz glass of hard liquor (44 mL). General instructions  Take over-the-counter and prescription medicines only as told by your health care provider.  Do not drive or use heavy machinery while taking prescription pain medicine.  Return to your normal activities as told by your health care provider. Ask your health care provider what activities are safe for you.  Keep all follow-up visits as told by your health care provider. This is important. Contact a health care provider if you have:  Another gout attack.  Continuing symptoms of a gout attack after 10 days of treatment.  Side effects from your medicines.  Chills or a fever.  Burning pain when you urinate.  Pain in your lower back or belly. Get help right away if you:  Have  severe or uncontrolled pain.  Cannot urinate. Summary  Gout is painful swelling of the joints caused by inflammation.  The most common site of pain is the big toe, but it can affect other joints in the body.  Medicines and dietary changes can help to prevent and treat gout attacks. This information is not intended to replace advice given to you by your health care provider. Make sure you discuss any questions you have with your health care provider. Document Released: 11/18/2000 Document Revised: 06/13/2018 Document Reviewed: 06/13/2018 Elsevier Patient Education  2020 [employer].

## 2019-08-08 ENCOUNTER — Encounter
Admission: RE | Admit: 2019-08-08 | Discharge: 2019-08-08 | Disposition: A | Discharge status: Home / Self Care | Payer: PPO | Source: Ambulatory Visit | Attending: Orthopedic Surgery | Admitting: Orthopedic Surgery

## 2019-08-08 DIAGNOSIS — Z01812 Encounter for preprocedural laboratory examination: Secondary | ICD-10-CM | POA: Diagnosis not present

## 2019-08-08 LAB — TYPE AND SCREEN
ABO/RH(D): B POS
Antibody Screen: NEGATIVE

## 2019-08-08 LAB — URINALYSIS, ROUTINE W REFLEX MICROSCOPIC
Bilirubin Urine: NEGATIVE
Glucose, UA: NEGATIVE mg/dL
Hgb urine dipstick: NEGATIVE
Ketones, ur: NEGATIVE mg/dL
Leukocytes,Ua: NEGATIVE
Nitrite: NEGATIVE
Protein, ur: NEGATIVE mg/dL
Specific Gravity, Urine: 1.018 (ref 1.005–1.030)
pH: 6 (ref 5.0–8.0)

## 2019-08-08 LAB — SURGICAL PCR SCREEN
MRSA, PCR: NEGATIVE
Staphylococcus aureus: NEGATIVE

## 2019-08-08 LAB — PROTIME-INR
INR: 0.9 (ref 0.8–1.2)
Prothrombin Time: 11.9 seconds (ref 11.4–15.2)

## 2019-08-08 LAB — APTT: aPTT: 31 seconds (ref 24–36)

## 2019-08-08 NOTE — Patient Instructions (Signed)
Your procedure is scheduled on: Thursday 08/15/19.  Report to DAY SURGERY DEPARTMENT LOCATED ON 2ND FLOOR MEDICAL MALL ENTRANCE. To find out your arrival time please call [phone] between 1PM - 3PM on Wednesday 08/14/19.   Remember: Instructions that are not followed completely may result in serious medical risk, up to and including death, or upon the discretion of your surgeon and anesthesiologist your surgery may need to be rescheduled.      _X__ 1. Do not eat food after midnight the night before your procedure.                 No gum chewing or hard candies. You may drink water up to 2 hours                 before you are scheduled to arrive for your surgery- DO NOT water                 liquids within 2 hours of the start of your surgery.                   Drink the G2 Gatorade drink the morning of your procedure. Finish the drink 2-3 hours before arrival.   __X__2.  On the morning of surgery brush your teeth with toothpaste and water, you may rinse your mouth with mouthwash if you wish.  Do not swallow any toothpaste or mouthwash.      _X__ 3.  No Alcohol for 24 hours before or after surgery.    __X__4.  Notify your doctor if there is any change in your medical condition      (cold, fever, infections).     Do not wear jewelry, make-up, hairpins, clips or nail polish. Do not wear lotions, powders, or perfumes.  Do not shave 48 hours prior to surgery. Men may shave face and neck. Do not bring valuables to the hospital.     [facility] is not responsible for any belongings or valuables.   Contacts, dentures/partials or body piercings may not be worn into surgery. Bring a case for your contacts, glasses or hearing aids, a denture cup will be supplied.   For patients admitted to the hospital, discharge time is determined by your treatment team.    Please read over the following fact sheets that you were given:   MRSA Information   __X__ Take these medicines the  morning of surgery with A SIP OF WATER:     1. acetaminophen (TYLENOL) 500 MG tablet  2. allopurinol (ZYLOPRIM) 100 MG tablet  3. docusate sodium (COLACE) 50 MG capsule  4. DULoxetine (CYMBALTA) 30 MG capsule  5. loratadine (CLARITIN) 10 MG tablet  6. metoprolol succinate (TOPROL-XL) 25 MG 24 hr tablet  7. tamsulosin (FLOMAX) 0.4 MG CAPS capsule     __X__ Use CHG Soap as directed    __X__ Stop metformin/Janumet/Farxiga 2 days prior to surgery. Your last dose of Metformin will be on Monday 08/12/19.    __X__ Stop Blood Thinners Coumadin/Plavix/Xarelto/Pleta/Pradaxa/Eliquis/Effient/Aspirin today.   __X__ Stop Anti-inflammatories 7 days before surgery such as Advil, Ibuprofen, Motrin, BC or Goodies Powder, Naprosyn, Naproxen, Aleve, Aspirin, Meloxicam. May take Tylenol if needed for pain or discomfort.    __X__ Don't begin taking any new herbal supplements before your surgery.

## 2019-08-09 LAB — URINE CULTURE: Culture: NO GROWTH

## 2019-08-13 ENCOUNTER — Other Ambulatory Visit: Payer: Self-pay

## 2019-08-13 ENCOUNTER — Other Ambulatory Visit
Admission: RE | Admit: 2019-08-13 | Discharge: 2019-08-13 | Disposition: A | Discharge status: Home / Self Care | Payer: PPO | Source: Ambulatory Visit | Attending: Orthopedic Surgery | Admitting: Orthopedic Surgery

## 2019-08-13 DIAGNOSIS — Z20828 Contact with and (suspected) exposure to other viral communicable diseases: Secondary | ICD-10-CM | POA: Diagnosis not present

## 2019-08-13 DIAGNOSIS — Z01812 Encounter for preprocedural laboratory examination: Secondary | ICD-10-CM | POA: Insufficient documentation

## 2019-08-13 LAB — SARS CORONAVIRUS 2 (TAT 6-24 HRS): SARS Coronavirus 2: NEGATIVE

## 2019-08-15 ENCOUNTER — Inpatient Hospital Stay
Admission: RE | Admit: 2019-08-15 | Discharge: 2019-08-19 | DRG: 470 | Disposition: A | Discharge status: Home / Self Care | Payer: PPO | Attending: Orthopedic Surgery | Admitting: Orthopedic Surgery

## 2019-08-15 ENCOUNTER — Encounter: Payer: Self-pay | Admitting: *Deleted

## 2019-08-15 ENCOUNTER — Encounter
Admission: RE | Disposition: A | Discharge status: Home / Self Care | Payer: Self-pay | Source: Home / Self Care | Attending: Orthopedic Surgery

## 2019-08-15 ENCOUNTER — Inpatient Hospital Stay: Payer: PPO | Admitting: Anesthesiology

## 2019-08-15 ENCOUNTER — Inpatient Hospital Stay: Payer: PPO

## 2019-08-15 ENCOUNTER — Other Ambulatory Visit: Payer: Self-pay

## 2019-08-15 DIAGNOSIS — Z471 Aftercare following joint replacement surgery: Secondary | ICD-10-CM | POA: Diagnosis not present

## 2019-08-15 DIAGNOSIS — I10 Essential (primary) hypertension: Secondary | ICD-10-CM | POA: Diagnosis present

## 2019-08-15 DIAGNOSIS — F329 Major depressive disorder, single episode, unspecified: Secondary | ICD-10-CM | POA: Diagnosis not present

## 2019-08-15 DIAGNOSIS — G4733 Obstructive sleep apnea (adult) (pediatric): Secondary | ICD-10-CM | POA: Diagnosis not present

## 2019-08-15 DIAGNOSIS — Z7984 Long term (current) use of oral hypoglycemic drugs: Secondary | ICD-10-CM | POA: Diagnosis not present

## 2019-08-15 DIAGNOSIS — Z79899 Other long term (current) drug therapy: Secondary | ICD-10-CM

## 2019-08-15 DIAGNOSIS — Z96652 Presence of left artificial knee joint: Secondary | ICD-10-CM

## 2019-08-15 DIAGNOSIS — M109 Gout, unspecified: Secondary | ICD-10-CM | POA: Diagnosis present

## 2019-08-15 DIAGNOSIS — Z96651 Presence of right artificial knee joint: Secondary | ICD-10-CM | POA: Diagnosis present

## 2019-08-15 DIAGNOSIS — Z886 Allergy status to analgesic agent status: Secondary | ICD-10-CM | POA: Diagnosis not present

## 2019-08-15 DIAGNOSIS — Z20828 Contact with and (suspected) exposure to other viral communicable diseases: Secondary | ICD-10-CM | POA: Diagnosis not present

## 2019-08-15 DIAGNOSIS — Z8669 Personal history of other diseases of the nervous system and sense organs: Secondary | ICD-10-CM

## 2019-08-15 DIAGNOSIS — Z7982 Long term (current) use of aspirin: Secondary | ICD-10-CM | POA: Diagnosis not present

## 2019-08-15 DIAGNOSIS — I4510 Unspecified right bundle-branch block: Secondary | ICD-10-CM | POA: Diagnosis not present

## 2019-08-15 DIAGNOSIS — Z8249 Family history of ischemic heart disease and other diseases of the circulatory system: Secondary | ICD-10-CM | POA: Diagnosis not present

## 2019-08-15 DIAGNOSIS — M1712 Unilateral primary osteoarthritis, left knee: Principal | ICD-10-CM | POA: Diagnosis present

## 2019-08-15 DIAGNOSIS — E119 Type 2 diabetes mellitus without complications: Secondary | ICD-10-CM | POA: Diagnosis present

## 2019-08-15 HISTORY — PX: TOTAL KNEE ARTHROPLASTY: SHX125

## 2019-08-15 LAB — GLUCOSE, CAPILLARY
Glucose-Capillary: 159 mg/dL — ABNORMAL HIGH (ref 70–99)
Glucose-Capillary: 134 mg/dL — ABNORMAL HIGH (ref 70–99)
Glucose-Capillary: 165 mg/dL — ABNORMAL HIGH (ref 70–99)

## 2019-08-15 SURGERY — ARTHROPLASTY, KNEE, TOTAL
Anesthesia: Spinal | Site: Knee | Laterality: Left

## 2019-08-15 MED ORDER — FENTANYL CITRATE (PF) 100 MCG/2ML IJ SOLN
INTRAMUSCULAR | Status: DC | PRN
  Administered 2019-08-15 (×2): 50 ug via INTRAVENOUS

## 2019-08-15 MED ORDER — TRAZODONE HCL 50 MG PO TABS
50.0000 mg | ORAL_TABLET | Freq: Every day | ORAL | Status: DC
  Administered 2019-08-15 – 2019-08-18 (×4): 50 mg via ORAL
  Filled 2019-08-15 (×4): qty 1

## 2019-08-15 MED ORDER — BUPIVACAINE HCL (PF) 0.5 % IJ SOLN
INTRAMUSCULAR | Status: AC
  Filled 2019-08-15: qty 10

## 2019-08-15 MED ORDER — ONDANSETRON HCL 4 MG/2ML IJ SOLN: 4.0000 mg | Freq: Once | INTRAMUSCULAR | Status: DC | PRN

## 2019-08-15 MED ORDER — PROPOFOL 10 MG/ML IV BOLUS
INTRAVENOUS | Status: AC
  Filled 2019-08-15: qty 20

## 2019-08-15 MED ORDER — LORATADINE 10 MG PO TABS
10.0000 mg | ORAL_TABLET | Freq: Every day | ORAL | Status: DC
  Administered 2019-08-16 – 2019-08-19 (×4): 10 mg via ORAL
  Filled 2019-08-15 (×5): qty 1

## 2019-08-15 MED ORDER — MIDAZOLAM HCL 5 MG/5ML IJ SOLN
INTRAMUSCULAR | Status: DC | PRN
  Administered 2019-08-15 (×2): 1 mg via INTRAVENOUS

## 2019-08-15 MED ORDER — FENTANYL CITRATE (PF) 100 MCG/2ML IJ SOLN: 25.0000 ug | INTRAMUSCULAR | Status: DC | PRN

## 2019-08-15 MED ORDER — VASOPRESSIN 20 UNIT/ML IV SOLN
INTRAVENOUS | Status: DC | PRN
  Administered 2019-08-15 (×2): 1 [IU] via INTRAVENOUS

## 2019-08-15 MED ORDER — AZELASTINE HCL 0.1 % NA SOLN
1.0000 | Freq: Two times a day (BID) | NASAL | Status: DC | PRN
  Filled 2019-08-15: qty 30

## 2019-08-15 MED ORDER — KETOROLAC TROMETHAMINE 30 MG/ML IJ SOLN
INTRAMUSCULAR | Status: AC
  Filled 2019-08-15: qty 1

## 2019-08-15 MED ORDER — TRANEXAMIC ACID 1000 MG/10ML IV SOLN
INTRAVENOUS | Status: AC
  Filled 2019-08-15: qty 10

## 2019-08-15 MED ORDER — CEFAZOLIN SODIUM-DEXTROSE 2-4 GM/100ML-% IV SOLN
INTRAVENOUS | Status: AC
  Filled 2019-08-15: qty 100

## 2019-08-15 MED ORDER — MIDAZOLAM HCL 2 MG/2ML IJ SOLN
INTRAMUSCULAR | Status: AC
  Filled 2019-08-15: qty 2

## 2019-08-15 MED ORDER — PHENYLEPHRINE HCL (PRESSORS) 10 MG/ML IV SOLN
INTRAVENOUS | Status: DC | PRN
  Administered 2019-08-15: 100 ug via INTRAVENOUS
  Administered 2019-08-15: 50 ug via INTRAVENOUS

## 2019-08-15 MED ORDER — DOCUSATE SODIUM 100 MG PO CAPS
100.0000 mg | ORAL_CAPSULE | Freq: Two times a day (BID) | ORAL | Status: DC
  Administered 2019-08-15 – 2019-08-19 (×8): 100 mg via ORAL
  Filled 2019-08-15 (×8): qty 1

## 2019-08-15 MED ORDER — ACETAMINOPHEN 325 MG PO TABS
325.0000 mg | ORAL_TABLET | Freq: Four times a day (QID) | ORAL | Status: DC | PRN
  Administered 2019-08-16 – 2019-08-19 (×2): 650 mg via ORAL
  Filled 2019-08-15 (×2): qty 2

## 2019-08-15 MED ORDER — ONDANSETRON HCL 4 MG/2ML IJ SOLN
INTRAMUSCULAR | Status: DC | PRN
  Administered 2019-08-15: 4 mg via INTRAVENOUS

## 2019-08-15 MED ORDER — VITAMIN D 25 MCG (1000 UNIT) PO TABS
2000.0000 [IU] | ORAL_TABLET | Freq: Every day | ORAL | Status: DC
  Administered 2019-08-16 – 2019-08-19 (×4): 2000 [IU] via ORAL
  Filled 2019-08-15 (×4): qty 2

## 2019-08-15 MED ORDER — CHLORHEXIDINE GLUCONATE CLOTH 2 % EX PADS: 6.0000 | MEDICATED_PAD | Freq: Once | CUTANEOUS | Status: DC

## 2019-08-15 MED ORDER — EPHEDRINE SULFATE 50 MG/ML IJ SOLN
INTRAMUSCULAR | Status: DC | PRN
  Administered 2019-08-15 (×2): 10 mg via INTRAVENOUS
  Administered 2019-08-15: 20 mg via INTRAVENOUS
  Administered 2019-08-15: 10 mg via INTRAVENOUS

## 2019-08-15 MED ORDER — SODIUM CHLORIDE 0.9 % IV SOLN
INTRAVENOUS | Status: DC | PRN
  Administered 2019-08-15: 60 mL

## 2019-08-15 MED ORDER — PROPOFOL 10 MG/ML IV BOLUS
INTRAVENOUS | Status: DC | PRN
  Administered 2019-08-15: 30 mg via INTRAVENOUS

## 2019-08-15 MED ORDER — METHOCARBAMOL 500 MG PO TABS
500.0000 mg | ORAL_TABLET | Freq: Four times a day (QID) | ORAL | Status: DC | PRN
  Administered 2019-08-15 – 2019-08-16 (×2): 500 mg via ORAL
  Filled 2019-08-15 (×2): qty 1

## 2019-08-15 MED ORDER — VASOPRESSIN 20 UNIT/ML IV SOLN
INTRAVENOUS | Status: AC
  Filled 2019-08-15: qty 1

## 2019-08-15 MED ORDER — DULOXETINE HCL 30 MG PO CPEP
30.0000 mg | ORAL_CAPSULE | Freq: Two times a day (BID) | ORAL | Status: DC
  Administered 2019-08-15 – 2019-08-19 (×8): 30 mg via ORAL
  Filled 2019-08-15 (×9): qty 1

## 2019-08-15 MED ORDER — BUPIVACAINE-EPINEPHRINE (PF) 0.25% -1:200000 IJ SOLN
INTRAMUSCULAR | Status: AC
  Filled 2019-08-15: qty 30

## 2019-08-15 MED ORDER — HYDROCHLOROTHIAZIDE 12.5 MG PO CAPS
12.5000 mg | ORAL_CAPSULE | Freq: Every day | ORAL | Status: DC
  Administered 2019-08-16 – 2019-08-19 (×4): 12.5 mg via ORAL
  Filled 2019-08-15 (×4): qty 1

## 2019-08-15 MED ORDER — VITAMIN B-12 1000 MCG PO TABS
5000.0000 ug | ORAL_TABLET | Freq: Every day | ORAL | Status: DC
  Administered 2019-08-16 – 2019-08-19 (×4): 5000 ug via ORAL
  Filled 2019-08-15 (×4): qty 5

## 2019-08-15 MED ORDER — CEFAZOLIN SODIUM-DEXTROSE 1-4 GM/50ML-% IV SOLN
1.0000 g | Freq: Four times a day (QID) | INTRAVENOUS | Status: AC
  Administered 2019-08-15 (×2): 1 g via INTRAVENOUS
  Filled 2019-08-15 (×2): qty 50

## 2019-08-15 MED ORDER — FAMOTIDINE 20 MG PO TABS
ORAL_TABLET | ORAL | Status: AC
  Filled 2019-08-15: qty 1

## 2019-08-15 MED ORDER — FENTANYL CITRATE (PF) 100 MCG/2ML IJ SOLN
INTRAMUSCULAR | Status: AC
  Filled 2019-08-15: qty 2

## 2019-08-15 MED ORDER — ENOXAPARIN SODIUM 40 MG/0.4ML ~~LOC~~ SOLN
40.0000 mg | SUBCUTANEOUS | Status: DC
  Administered 2019-08-16 – 2019-08-17 (×2): 40 mg via SUBCUTANEOUS
  Filled 2019-08-15 (×2): qty 0.4

## 2019-08-15 MED ORDER — PROPOFOL 500 MG/50ML IV EMUL
INTRAVENOUS | Status: AC
  Filled 2019-08-15: qty 50

## 2019-08-15 MED ORDER — SODIUM CHLORIDE 0.9 % IV SOLN
INTRAVENOUS | Status: DC
  Administered 2019-08-15 (×2): via INTRAVENOUS

## 2019-08-15 MED ORDER — BUPIVACAINE LIPOSOME 1.3 % IJ SUSP
INTRAMUSCULAR | Status: AC
  Filled 2019-08-15: qty 20

## 2019-08-15 MED ORDER — OXYCODONE HCL 5 MG PO TABS
10.0000 mg | ORAL_TABLET | ORAL | Status: DC | PRN
  Administered 2019-08-16: 10 mg via ORAL

## 2019-08-15 MED ORDER — ONDANSETRON HCL 4 MG/2ML IJ SOLN
INTRAMUSCULAR | Status: AC
  Filled 2019-08-15: qty 2

## 2019-08-15 MED ORDER — BUPIVACAINE-EPINEPHRINE 0.25% -1:200000 IJ SOLN
INTRAMUSCULAR | Status: DC | PRN
  Administered 2019-08-15: 30 mL

## 2019-08-15 MED ORDER — DIPHENHYDRAMINE HCL 12.5 MG/5ML PO ELIX: 12.5000 mg | ORAL_SOLUTION | ORAL | Status: DC | PRN

## 2019-08-15 MED ORDER — SODIUM CHLORIDE 0.9 % IV SOLN
INTRAVENOUS | Status: DC | PRN
  Administered 2019-08-15: 30 ug/min via INTRAVENOUS

## 2019-08-15 MED ORDER — HYDROMORPHONE HCL 1 MG/ML IJ SOLN
0.5000 mg | INTRAMUSCULAR | Status: DC | PRN
  Administered 2019-08-16 (×4): 1 mg via INTRAVENOUS
  Filled 2019-08-15 (×4): qty 1

## 2019-08-15 MED ORDER — ALUM & MAG HYDROXIDE-SIMETH 200-200-20 MG/5ML PO SUSP: 30.0000 mL | ORAL | Status: DC | PRN

## 2019-08-15 MED ORDER — AMLODIPINE BESYLATE 10 MG PO TABS
10.0000 mg | ORAL_TABLET | Freq: Every day | ORAL | Status: DC
  Administered 2019-08-16 – 2019-08-18 (×3): 10 mg via ORAL
  Filled 2019-08-15 (×3): qty 1

## 2019-08-15 MED ORDER — PHENYLEPHRINE HCL (PRESSORS) 10 MG/ML IV SOLN
INTRAVENOUS | Status: AC
  Filled 2019-08-15: qty 1

## 2019-08-15 MED ORDER — METHOCARBAMOL 1000 MG/10ML IJ SOLN
500.0000 mg | Freq: Four times a day (QID) | INTRAVENOUS | Status: DC | PRN
  Filled 2019-08-15: qty 5

## 2019-08-15 MED ORDER — PRAVASTATIN SODIUM 20 MG PO TABS
40.0000 mg | ORAL_TABLET | ORAL | Status: DC
  Administered 2019-08-16 – 2019-08-19 (×2): 40 mg via ORAL
  Filled 2019-08-15 (×2): qty 2

## 2019-08-15 MED ORDER — ONDANSETRON HCL 4 MG PO TABS: 4.0000 mg | ORAL_TABLET | Freq: Four times a day (QID) | ORAL | Status: DC | PRN

## 2019-08-15 MED ORDER — MAGNESIUM CITRATE PO SOLN
1.0000 | Freq: Once | ORAL | Status: DC | PRN
  Filled 2019-08-15: qty 296

## 2019-08-15 MED ORDER — FAMOTIDINE 20 MG PO TABS
20.0000 mg | ORAL_TABLET | Freq: Once | ORAL | Status: AC
  Administered 2019-08-15: 20 mg via ORAL

## 2019-08-15 MED ORDER — TAMSULOSIN HCL 0.4 MG PO CAPS
0.4000 mg | ORAL_CAPSULE | Freq: Every day | ORAL | Status: DC
  Administered 2019-08-16 – 2019-08-18 (×3): 0.4 mg via ORAL
  Filled 2019-08-15 (×3): qty 1

## 2019-08-15 MED ORDER — SODIUM CHLORIDE FLUSH 0.9 % IV SOLN
INTRAVENOUS | Status: AC
  Filled 2019-08-15: qty 60

## 2019-08-15 MED ORDER — CLINDAMYCIN PHOSPHATE 600 MG/50ML IV SOLN
INTRAVENOUS | Status: AC
  Filled 2019-08-15: qty 50

## 2019-08-15 MED ORDER — ASPIRIN EC 81 MG PO TBEC
81.0000 mg | DELAYED_RELEASE_TABLET | Freq: Every day | ORAL | Status: DC
  Administered 2019-08-16 – 2019-08-19 (×4): 81 mg via ORAL
  Filled 2019-08-15 (×4): qty 1

## 2019-08-15 MED ORDER — MORPHINE SULFATE (PF) 4 MG/ML IV SOLN
INTRAVENOUS | Status: AC
  Filled 2019-08-15: qty 1

## 2019-08-15 MED ORDER — METFORMIN HCL 500 MG PO TABS
500.0000 mg | ORAL_TABLET | Freq: Two times a day (BID) | ORAL | Status: DC
  Administered 2019-08-15 – 2019-08-19 (×8): 500 mg via ORAL
  Filled 2019-08-15 (×8): qty 1

## 2019-08-15 MED ORDER — BISACODYL 5 MG PO TBEC
5.0000 mg | DELAYED_RELEASE_TABLET | Freq: Every day | ORAL | Status: DC | PRN
  Administered 2019-08-17: 5 mg via ORAL
  Filled 2019-08-15: qty 1

## 2019-08-15 MED ORDER — TRANEXAMIC ACID-NACL 1000-0.7 MG/100ML-% IV SOLN
INTRAVENOUS | Status: DC | PRN
  Administered 2019-08-15: 1000 mg via INTRAVENOUS

## 2019-08-15 MED ORDER — ALLOPURINOL 100 MG PO TABS
100.0000 mg | ORAL_TABLET | Freq: Every day | ORAL | Status: DC
  Administered 2019-08-16 – 2019-08-19 (×4): 100 mg via ORAL
  Filled 2019-08-15 (×4): qty 1

## 2019-08-15 MED ORDER — LOSARTAN POTASSIUM 50 MG PO TABS
100.0000 mg | ORAL_TABLET | Freq: Every day | ORAL | Status: DC
  Administered 2019-08-16 – 2019-08-19 (×4): 100 mg via ORAL
  Filled 2019-08-15 (×4): qty 2

## 2019-08-15 MED ORDER — METOPROLOL SUCCINATE ER 25 MG PO TB24
25.0000 mg | ORAL_TABLET | Freq: Every day | ORAL | Status: DC
  Administered 2019-08-16 – 2019-08-18 (×3): 25 mg via ORAL
  Filled 2019-08-15 (×3): qty 1

## 2019-08-15 MED ORDER — NEOMYCIN-POLYMYXIN B GU 40-200000 IR SOLN
Status: AC
  Filled 2019-08-15: qty 20

## 2019-08-15 MED ORDER — NEOMYCIN-POLYMYXIN B GU 40-200000 IR SOLN
Status: DC | PRN
  Administered 2019-08-15: 16 mL

## 2019-08-15 MED ORDER — OXYCODONE HCL 5 MG PO TABS
5.0000 mg | ORAL_TABLET | ORAL | Status: DC | PRN
  Administered 2019-08-15 (×2): 5 mg via ORAL
  Administered 2019-08-15 – 2019-08-16 (×3): 10 mg via ORAL
  Administered 2019-08-16: 5 mg via ORAL
  Administered 2019-08-17 – 2019-08-19 (×9): 10 mg via ORAL
  Filled 2019-08-15 (×3): qty 2
  Filled 2019-08-15 (×2): qty 1
  Filled 2019-08-15 (×2): qty 2
  Filled 2019-08-15: qty 1
  Filled 2019-08-15 (×8): qty 2

## 2019-08-15 MED ORDER — PROPOFOL 500 MG/50ML IV EMUL
INTRAVENOUS | Status: DC | PRN
  Administered 2019-08-15: 100 ug/kg/min via INTRAVENOUS

## 2019-08-15 MED ORDER — KETOROLAC TROMETHAMINE 30 MG/ML IJ SOLN
INTRAMUSCULAR | Status: DC | PRN
  Administered 2019-08-15: 30 mg via INTRAMUSCULAR

## 2019-08-15 MED ORDER — SENNOSIDES-DOCUSATE SODIUM 8.6-50 MG PO TABS: 1.0000 | ORAL_TABLET | Freq: Every evening | ORAL | Status: DC | PRN

## 2019-08-15 MED ORDER — SODIUM CHLORIDE 0.9 % IV SOLN
INTRAVENOUS | Status: DC
  Administered 2019-08-15 – 2019-08-16 (×3): via INTRAVENOUS

## 2019-08-15 MED ORDER — CEFAZOLIN SODIUM-DEXTROSE 2-3 GM-%(50ML) IV SOLR
INTRAVENOUS | Status: DC | PRN
  Administered 2019-08-15: 2 g via INTRAVENOUS

## 2019-08-15 MED ORDER — CEFAZOLIN SODIUM-DEXTROSE 2-4 GM/100ML-% IV SOLN: 2.0000 g | INTRAVENOUS | Status: DC

## 2019-08-15 MED ORDER — FAMOTIDINE 20 MG PO TABS
20.0000 mg | ORAL_TABLET | Freq: Every day | ORAL | Status: DC
  Administered 2019-08-16 – 2019-08-19 (×4): 20 mg via ORAL
  Filled 2019-08-15 (×4): qty 1

## 2019-08-15 MED ORDER — ONDANSETRON HCL 4 MG/2ML IJ SOLN: 4.0000 mg | Freq: Four times a day (QID) | INTRAMUSCULAR | Status: DC | PRN

## 2019-08-15 MED ORDER — CLINDAMYCIN PHOSPHATE 600 MG/50ML IV SOLN
600.0000 mg | Freq: Once | INTRAVENOUS | Status: AC
  Administered 2019-08-15: 08:00:00 600 mg via INTRAVENOUS

## 2019-08-15 MED ORDER — ACETAMINOPHEN 500 MG PO TABS
1000.0000 mg | ORAL_TABLET | Freq: Four times a day (QID) | ORAL | Status: AC
  Administered 2019-08-15 – 2019-08-16 (×3): 1000 mg via ORAL
  Filled 2019-08-15 (×3): qty 2

## 2019-08-15 MED ORDER — TRANEXAMIC ACID 1000 MG/10ML IV SOLN
1.5000 mg/kg/h | INTRAVENOUS | Status: DC
  Filled 2019-08-15: qty 25

## 2019-08-15 MED ORDER — MORPHINE SULFATE 4 MG/ML IJ SOLN
INTRAMUSCULAR | Status: DC | PRN
  Administered 2019-08-15: 4 mg via INTRAMUSCULAR

## 2019-08-15 SURGICAL SUPPLY — 70 items
BLADE SAW 90X13X1.19 OSCILLAT (BLADE) ×3 IMPLANT
BLADE SAW 90X25X1.19 OSCILLAT (BLADE) ×3 IMPLANT
CANISTER SUCT 1200ML W/VALVE (MISCELLANEOUS) ×3 IMPLANT
CANISTER SUCT 3000ML PPV (MISCELLANEOUS) ×6 IMPLANT
CEMENT HV SMART SET (Cement) ×6 IMPLANT
CEMENT TIBIA MBT SIZE 4 (Knees) ×1 IMPLANT
CNTNR SPEC 2.5X3XGRAD LEK (MISCELLANEOUS) ×1
CONT SPEC 4OZ STER OR WHT (MISCELLANEOUS) ×2
CONTAINER SPEC 2.5X3XGRAD LEK (MISCELLANEOUS) ×1 IMPLANT
COOLER POLAR GLACIER W/PUMP (MISCELLANEOUS) ×3 IMPLANT
COVER WAND RF STERILE (DRAPES) ×3 IMPLANT
CUFF TOURN SGL QUICK 24 (TOURNIQUET CUFF)
CUFF TOURN SGL QUICK 30 (TOURNIQUET CUFF) ×2
CUFF TRNQT CYL 24X4X16.5-23 (TOURNIQUET CUFF) IMPLANT
CUFF TRNQT CYL 30X4X21-28X (TOURNIQUET CUFF) ×1 IMPLANT
DRAPE 3/4 80X56 (DRAPES) ×6 IMPLANT
DRAPE IMP U-DRAPE 54X76 (DRAPES) ×6 IMPLANT
DRAPE INCISE IOBAN 66X60 STRL (DRAPES) ×3 IMPLANT
DRAPE SPLIT 6X30 W/TAPE (DRAPES) ×3 IMPLANT
DRAPE SURG 17X11 SM STRL (DRAPES) ×6 IMPLANT
DRSG OPSITE POSTOP 4X12 (GAUZE/BANDAGES/DRESSINGS) ×3 IMPLANT
DRSG OPSITE POSTOP 4X14 (GAUZE/BANDAGES/DRESSINGS) IMPLANT
DURAPREP 26ML APPLICATOR (WOUND CARE) ×9 IMPLANT
ELECT REM PT RETURN 9FT ADLT (ELECTROSURGICAL) ×3
ELECTRODE REM PT RTRN 9FT ADLT (ELECTROSURGICAL) ×1 IMPLANT
FEMUR SIGMA PS SZ 5.0 L (Femur) ×3 IMPLANT
GAUZE SPONGE 4X4 12PLY STRL (GAUZE/BANDAGES/DRESSINGS) ×3 IMPLANT
GLOVE BIO SURGEON STRL SZ7.5 (GLOVE) ×3 IMPLANT
GLOVE BIOGEL PI IND STRL 9 (GLOVE) ×1 IMPLANT
GLOVE BIOGEL PI INDICATOR 9 (GLOVE) ×2
GLOVE INDICATOR 7.5 STRL GRN (GLOVE) ×3 IMPLANT
GLOVE SURG 9.0 ORTHO LTXF (GLOVE) ×6 IMPLANT
GOWN STRL REUS TWL 2XL XL LVL4 (GOWN DISPOSABLE) ×3 IMPLANT
GOWN STRL REUS W/ TWL LRG LVL3 (GOWN DISPOSABLE) ×1 IMPLANT
GOWN STRL REUS W/ TWL LRG LVL4 (GOWN DISPOSABLE) ×2 IMPLANT
GOWN STRL REUS W/TWL LRG LVL3 (GOWN DISPOSABLE) ×2
GOWN STRL REUS W/TWL LRG LVL4 (GOWN DISPOSABLE) ×4
HOLDER FOLEY CATH W/STRAP (MISCELLANEOUS) ×3 IMPLANT
IMMBOLIZER KNEE 19 BLUE UNIV (SOFTGOODS) ×3 IMPLANT
KIT TURNOVER KIT A (KITS) ×3 IMPLANT
NDL SAFETY ECLIPSE 18X1.5 (NEEDLE) ×1 IMPLANT
NEEDLE HYPO 18GX1.5 SHARP (NEEDLE) ×2
NEEDLE HYPO 22GX1.5 SAFETY (NEEDLE) ×3 IMPLANT
NEEDLE SPNL 20GX3.5 QUINCKE YW (NEEDLE) ×3 IMPLANT
NS IRRIG 1000ML POUR BTL (IV SOLUTION) IMPLANT
PACK TOTAL KNEE (MISCELLANEOUS) ×3 IMPLANT
PAD PREP 24X41 OB/GYN DISP (PERSONAL CARE ITEMS) ×3 IMPLANT
PAD WRAPON POLAR KNEE (MISCELLANEOUS) ×1 IMPLANT
PATELLA DOME PFC 38MM (Knees) ×3 IMPLANT
PENCIL SMOKE ULTRAEVAC 22 CON (MISCELLANEOUS) ×3 IMPLANT
PLATE ROT INSERT 12.5MM (Plate) ×3 IMPLANT
PULSAVAC PLUS IRRIG FAN TIP (DISPOSABLE) ×3
SOL .9 NS 3000ML IRR  AL (IV SOLUTION) ×2
SOL .9 NS 3000ML IRR UROMATIC (IV SOLUTION) ×1 IMPLANT
SPONGE DRAIN TRACH 4X4 STRL 2S (GAUZE/BANDAGES/DRESSINGS) ×3 IMPLANT
SPONGE LAP 18X18 RF (DISPOSABLE) IMPLANT
STAPLER SKIN PROX 35W (STAPLE) ×3 IMPLANT
SUCTION FRAZIER HANDLE 10FR (MISCELLANEOUS) ×2
SUCTION TUBE FRAZIER 10FR DISP (MISCELLANEOUS) ×1 IMPLANT
SUT ETHIBOND NAB CT1 #1 30IN (SUTURE) ×6 IMPLANT
SUT VIC AB 0 CT1 36 (SUTURE) ×3 IMPLANT
SUT VIC AB 2-0 CT1 (SUTURE) ×6 IMPLANT
SYR 20ML LL LF (SYRINGE) ×3 IMPLANT
SYR 30ML LL (SYRINGE) ×6 IMPLANT
TIBIA MBT CEMENT SIZE 4 (Knees) ×3 IMPLANT
TIP FAN IRRIG PULSAVAC PLUS (DISPOSABLE) ×1 IMPLANT
TOWER CARTRIDGE SMART MIX (DISPOSABLE) ×3 IMPLANT
TRAY FOLEY MTR SLVR 16FR STAT (SET/KITS/TRAYS/PACK) ×3 IMPLANT
TUBE SUCT KAM VAC (TUBING) ×3 IMPLANT
WRAPON POLAR PAD KNEE (MISCELLANEOUS) ×3

## 2019-08-15 NOTE — H&P (Signed)
PREOPERATIVE H&P  Chief Complaint: M17.12 unilateral primary osteoarthritis left knee  HPI: Larry Roman. is a 73 y.o. male who presents for preoperative history and physical with a diagnosis of M17.12 unilateral primary osteoarthritis left knee. Symptoms of pain, swelling and limited ROM are significantly impairing activities of daily living.  Patient has successfully undergone a right total knee arthroplasty by me on 09/27/2018.  Patient has had persistent pain and limitation due to his left knee and has elected to proceed with a left total knee arthroplasty.  Past Medical History:  Diagnosis Date  . Depression   . DM (diabetes mellitus) ([place])   . Extrinsic asthma, unspecified    childhood  . Hives of unknown origin   . HTN (hypertension)   . Other allergy, other than to medicinal agents   . RBBB   . Sleep apnea    25 years ago mild lost weight no longer uses cpap  . Wound of right leg 08/2018   area just below knee size of quarter, reddened around edges   Past Surgical History:  Procedure Laterality Date  . BACK SURGERY  1995   ruptured disc encapsulated nerves  . CATARACT EXTRACTION, BILATERAL  2016   Dr. Kerman Passey at [facility]  . EYE SURGERY Bilateral    cataract extractions  . JOINT REPLACEMENT Right    total knee  . TONSILLECTOMY    . TONSILLECTOMY    . TOTAL KNEE ARTHROPLASTY Right 09/27/2018   Procedure: TOTAL KNEE ARTHROPLASTY;  Surgeon: Thornton Park, MD;  Location: [facility] ORS;  Service: Orthopedics;  Laterality: Right;   Social History   Socioeconomic History  . Marital status: Married    Spouse name: Pam  . Number of children: 2  . Years of education: Not on file  . Highest education level: Not on file  Occupational History    Employer: [employer]    Comment: retired  Scientific laboratory technician  . Financial resource strain: Not hard at all  . Food insecurity    Worry: Never true    Inability: Never true  . Transportation needs    Medical: No     Non-medical: No  Tobacco Use  . Smoking status: Never Smoker  . Smokeless tobacco: Never Used  . Tobacco comment: tobacco use - no  Substance and Sexual Activity  . Alcohol use: Yes    Alcohol/week: 0.0 standard drinks    Comment: rare  . Drug use: No  . Sexual activity: Yes  Lifestyle  . Physical activity    Days per week: Not on file    Minutes per session: Not on file  . Stress: Not at all  Relationships  . Social Herbalist on phone: Not on file    Gets together: Not on file    Attends religious service: Not on file    Active member of club or organization: Not on file    Attends meetings of clubs or organizations: Not on file    Relationship status: Not on file  Other Topics Concern  . Not on file  Social History Narrative  . Not on file   Family History  Problem Relation Age of Onset  . Heart failure Father   . Heart attack Mother    Allergies  Allergen Reactions  . Ibuprofen Other (See Comments)    Ibuprofen and motrin causes bp to elevate   Prior to Admission medications   Medication Sig Start Date End Date Taking? Authorizing  Provider  ACCU-CHEK SOFTCLIX LANCETS lancets Use up to 4 times daily to check blood sugar. Diagnosis E11.9 10/22/16  Yes Cook, Jayce G, DO  acetaminophen (TYLENOL) 500 MG tablet Take 1,500 mg by mouth 2 (two) times daily as needed for moderate pain.   Yes [provider]  allopurinol (ZYLOPRIM) 100 MG tablet Take 1 tablet (100 mg total) by mouth daily. 08/07/19  Yes Burnard Hawthorne, FNP  amLODipine (NORVASC) 10 MG tablet TAKE 1 TABLET (10 MG TOTAL) BY MOUTH DAILY WITH LUNCH. 07/23/19  Yes Burnard Hawthorne, FNP  aspirin EC 81 MG tablet Take 81 mg by mouth daily.     Yes [provider]  Azelastine HCl 137 MCG/SPRAY SOLN Place 1 spray into both nostrils 2 (two) times daily. Patient taking differently: Place 1 spray into both nostrils 2 (two) times daily as needed (allergies).  10/15/18  Yes Gerlene Fee,  NP  Cholecalciferol (VITAMIN D3) 2000 UNITS TABS Take 2,000 Units by mouth daily.    Yes [provider]  cimetidine (TAGAMET HB) 200 MG tablet Take 200 mg by mouth every Monday, Wednesday, and Friday.    Yes [provider]  Cyanocobalamin (B-12) 5000 MCG SUBL Place 5,000 mcg under the tongue daily.    Yes [provider]  docusate sodium (COLACE) 50 MG capsule Take 50 mg by mouth daily.   Yes [provider]  DULoxetine (CYMBALTA) 30 MG capsule Take 1 capsule (30 mg total) by mouth 2 (two) times daily. 06/24/19  Yes Burnard Hawthorne, FNP  glucose blood (ONETOUCH VERIO) test strip TEST 3 TIMES A DAY 10/15/18  Yes Gerlene Fee, NP  hydrochlorothiazide (MICROZIDE) 12.5 MG capsule TAKE 1 CAPSULE BY MOUTH EVERY DAY Patient taking differently: Take 12.5 mg by mouth daily.  06/25/19  Yes Burnard Hawthorne, FNP  hydrocortisone cream 1 % Apply 1 application topically daily as needed for itching.   Yes [provider]  loratadine (CLARITIN) 10 MG tablet Take 10 mg by mouth daily.   Yes [provider]  losartan (COZAAR) 100 MG tablet TAKE 1 TABLET BY MOUTH EVERY DAY Patient taking differently: Take 100 mg by mouth daily.  06/10/19  Yes Burnard Hawthorne, FNP  metoprolol succinate (TOPROL-XL) 25 MG 24 hr tablet TAKE 1 TABLET BY MOUTH EVERY DAY Patient taking differently: Take 25 mg by mouth daily.  06/25/19  Yes Arnett, Yvetta Coder, FNP  NON FORMULARY Diet Type: NCS   Yes [provider]  pravastatin (PRAVACHOL) 40 MG tablet TAKE 1 TABLET BY MOUTH EVERY DAY Patient taking differently: Take 40 mg by mouth every Monday, Wednesday, and Friday.  06/25/19  Yes Arnett, Yvetta Coder, FNP  tamsulosin (FLOMAX) 0.4 MG CAPS capsule TAKE 1 CAPSULE BY MOUTH EVERY DAY Patient taking differently: Take 0.4 mg by mouth daily at 12 noon.  07/23/19  Yes Arnett, Yvetta Coder, FNP  traZODone (DESYREL) 50 MG tablet TAKE 1 TABLET BY MOUTH AT BEDTIME AS NEEDED FOR  SLEEP Patient taking differently: Take 50 mg by mouth at bedtime.  05/20/19  Yes Arnett, Yvetta Coder, FNP  metFORMIN (GLUCOPHAGE) 500 MG tablet TAKE 1 TABLET (500 MG TOTAL) BY MOUTH 2 (TWO) TIMES DAILY WITH A MEAL. 06/25/19   Burnard Hawthorne, FNP     Positive ROS: All other systems have been reviewed and were otherwise negative with the exception of those mentioned in the HPI and as above.  Physical Exam: General: Alert, no acute distress Cardiovascular: Regular rate and  rhythm, no murmurs rubs or gallops.  No pedal edema Respiratory: Clear to auscultation bilaterally, no wheezes rales or rhonchi. No cyanosis, no use of accessory musculature GI: No organomegaly, abdomen is soft and non-tender nondistended with positive bowel sounds. Skin: Skin intact, no lesions within the operative field. Neurologic: Sensation intact distally Psychiatric: Patient is competent for consent with normal mood and affect Lymphatic: No cervical lymphadenopathy  MUSCULOSKELETAL: left knee:  ROM -10 to 110.  No ligamentous instability.  Patient skin is intact.  There is no erythema ecchymosis or significant effusion.  He has no calf tenderness or lower leg edema.  He has palpable pedal pulses, intact sensation light touch and intact motor function distally with 5 out of 5 strength.  Assessment: M17.12 unilateral primary osteoarthritis left knee  Plan: Plan for Procedure(s): LEFT TOTAL KNEE ARTHROPLASTY  I reviewed the details the operation as well as the postoperative course.  The patient is familiar with these as he has been through the surgery before.   I discussed the risks and benefits of surgery.  He understands the risks include but are not limited to infection, bleeding requiring blood transfusion, nerve or blood vessel injury, joint stiffness or loss of motion, persistent pain, weakness or instability, fracture, dislocation and hardware failure or loosening and the need for further surgery. Medical risks  include but are not limited to DVT and pulmonary embolism, myocardial infarction, stroke, pneumonia, respiratory failure and death. Patient understood these risks and wished to proceed.   I have confirmed with the patient that he has no history of myocardial infarction, stroke or DVT/pulmonary embolism.  Therefore he is a candidate to use tranexamic acid during his case.  This has been ordered for the patient.   Thornton Park, MD   08/15/2019 7:58 AM

## 2019-08-15 NOTE — Op Note (Signed)
DATE OF SURGERY:  08/15/2019 TIME: 11:33 AM  PATIENT NAME:  Larry Roman.   AGE: 73 y.o.    PRE-OPERATIVE DIAGNOSIS:  M17.12 unilateral primary osteoarthritis left knee  POST-OPERATIVE DIAGNOSIS:  Same  PROCEDURE:  Procedure(s): LEFT TOTAL KNEE ARTHROPLASTY  SURGEON:  Thornton Park, MD   ASSISTANT:  Tessa Lerner, PA  OPERATIVE IMPLANTS: Depuy PFC Sigma, Posterior Stabilized Femural component size 5, Tibia size rotating platform component size 4, Patella polyethylene 3-peg oval button size 38, with a 12.5 mm polyethylene insert.  EBL:  25 cc  TOURNIQUET TIME:  127 minutes  PREOPERATIVE INDICATIONS:  Larry Roman. is an 73 y.o. male who has a diagnosis of  M17.12 unilateral primary osteoarthritis left knee and elected for a left total knee arthroplasty after failing nonoperative treatment.  Their knee pain significantly impacts their activity of daily living.  Radiographs have demonstrated tricompartmental osteoarthritis joint space narrowing, osteophytes, subchondral sclerosis.  The risks, benefits, and alternatives were discussed at length including but not limited to the risks of infection, bleeding, nerve or blood vessel injury, knee stiffness, fracture, dislocation, loosening or failure of the hardware and the need for further surgery. Medical risks include but not limited to DVT and pulmonary embolism, myocardial infarction, stroke, pneumonia, respiratory failure and death. I discussed these risks with the patient in my office prior to the date of surgery. They understood these risks and were willing to proceed.  OPERATIVE FINDINGS AND UNIQUE ASPECTS OF THE CASE: Advanced tricompartmental osteoarthritis.  OPERATIVE DESCRIPTION:  The patient was brought to the operative room and placed in a supine position after undergoing placement of a spinal anesthetic.  A Foley catheter was placed.  IV antibiotics were given. Patient received Ancef 2 g IV and clindamycin 600 mg IV.   Patient also received tranexamic acid prior to inflation of the tourniquet.  The lower extremity was prepped and draped in the usual sterile fashion.  A time out was performed to verify the patient's name, date of birth, medical record number, correct site of surgery and correct procedure to be performed. The timeout was also used to confirm the patient received antibiotics and that appropriate instruments, implants and radiographs studies were available in the room.  The leg was elevated and exsanguinated with an Esmarch and the tourniquet was inflated to 275 mmHg for 127 minutes..  A midline incision was made over the left knee. Full-thickness skin flaps were developed. A medial parapatellar arthrotomy was then made and the patella everted and the knee was brought into 90 of flexion. Hoffa's fat pad along with the cruciate ligaments and medial and lateral menisci were resected.   The distal femoral intramedullary canal was opened with a drill and the intramedullary distal femoral cutting jig was inserted into the femoral canal pinned into position. It was set at 5 degrees resecting 10 mm off the distal femur.  Care was taken to protect the collateral ligaments during distal femoral resection.  The distal femoral resection was performed with an oscillating saw. The femoral cutting guide was then removed.  The extramedullary tibial cutting guide was then placed using the anterior tibial crest and second ray of the foot as a references.  The tibial cutting guide was adjusted to allow for appropriate posterior slope.  The tibial cutting block was pinned into position. The slotted stylus was used to measure the proximal tibial resection of 10 mm off the high lateral side.  The tibial long rod alignment guide was then used  to confirm position of the cutting block. A third cross pin through the tibial cutting block was then drilled into position to allow for rotational stability. Care was taken during the tibial  resection to protect the medial and collateral ligaments.  The resected tibial bone was removed along with the posterior horns of the menisci.  The PCL was sacrificed.  Extension gap was measured with a spacer block and alignment and extension was confirmed using a long alignment rod.  The attention was then turned back to the femur. The posterior referencing distal femoral sizing guide was applied to the distal femur.  The femur was sized to be a size 5. Rotation of the referencing guide was checked with the epicondylar axis and Whitesides line. Then the 4-in-1 cutting jig was then applied to the distal femur. A stylus was used to confirm that the anterior femur would not be notched.   Then the anterior, posterior and chamfer femoral cuts were then made with an oscillating saw.  The flexion gap was then measured with a flexion spacer block and long alignment rod and was found to be symmetric with the extension gap and perpendicular to mechanical axis of the tibia.  The distal femoral preparation was completed by performing the posterior stabilized box cut using the cutting block. The entry site for the intramedullary femoral guide was filled with autologous bone graft from bone previously resected earlier in the case.  The proximal tibia plateau was then sized with trial trays. The best coverage was achieved with a size 4. This tibial tray was then pinned into position. The proximal tibia was then prepared with the reamer and keel punch.  After tibial preparation was completed, all trial components were inserted with polyethylene trials.  The knee was found to have excellent balance and full motion with a size 10 mm tibial polyethylene insert..    The attention was then turned to preparation of the patella. The thickness of the patella was measured with a caliper, the diameter measured with the patella templates.  The patella resection was then made with an oscillating saw using the patella cutting guide.   3 peg holes for the patella component were then drilled. The trial patella was then placed. Knee was taken through a full range of motion and deemed to be stable with the trial components. All trial components were then removed. The knee capsule was then injected with Exparel.  The knee joint capsule was injected with a mixture of quarter percent Marcaine, Toradol and morphine to assist with postoperative pain relief.  The joint was copiously irrigated with pulse lavage.  The final total knee arthroplasty components were then cemented into place with a 10 mm trial polyethylene insert and all excess methylmethacrylate was removed.  The joint was again copiously irrigated. After the cement had hardened the knee was again taken through a full range of motion. It was felt to be most stable with the 12.5 mm tibial polyethylene insert. The actual tibial polyethylene insert was then placed.   The knee was taken through a range of motion and the patella tracked well and the knee was again irrigated copiously.    The medial arthrotomy was closed with #1 Ethibond. The subcutaneous tissue closed with 0 and 2-0 vicryl, and skin approximated with staples.  A dry sterile and compressive dressing was applied.  A Polar Care was applied to the operative knee along with a knee immobilizer.  The patient was awakened and brought to the PACU in  stable and satisfactory condition.  All sharp, lap and instrument counts were correct at the conclusion the case. I spoke with the patient's wife postoperatively by phone to let her know the case had been performed without complication and the patient was stable in recovery room.

## 2019-08-15 NOTE — Evaluation (Signed)
Physical Therapy Evaluation Patient Details Name: Larry Roman. MRN: [id number] DOB: March 23, 1946 Today's Date: 08/15/2019   History of Present Illness  Pt was admitted for L TKR as a result impairments associated with knee OA. Pt is post op day 0 on first day of treatment.   Clinical Impression  Pt is a 73 y/o male s/p L TKR to address knee OA. Pt had a R TKR in 2019, and is familiar with the healing process and treatment associated with the procedure. He was able to perform bed mobility with supervision; transfers and ambulation performed with min assistance. Pt was able to perform 10 SLR with independence, therefore KI not used during mobility efforts. Once seated in chair, KI donned per nursing orders. His rehab potential is good, and he is motivated to perform therapy. Deficits include strength, balance, pain, and ROM. Pt. would benefit from skilled PT during acute care stay to improve deficits. Currently recommending follow-up outpatient PT following acute care discharge.      Follow Up Recommendations Outpatient PT    Equipment Recommendations  None recommended by PT    Recommendations for Other Services       Precautions / Restrictions Precautions Precautions: Knee Precaution Booklet Issued: No Precaution Comments: Pt aware of precautions due to previous TKR in 2019(will be administered day 1) Required Braces or Orthoses: Knee Immobilizer - Left Knee Immobilizer - Left: (On when PT not present) Restrictions Weight Bearing Restrictions: Yes LUE Weight Bearing: Weight bearing as tolerated      Mobility  Bed Mobility Overal bed mobility: Modified Independent             General bed mobility comments: Pt. able to perform bed mobility tasks with supervision  Transfers Overall transfer level: Modified independent Equipment used: Rolling walker (2 wheeled)             General transfer comment: Pt. performed sit-to-stand transfer with mod independence;  RW  Ambulation/Gait Ambulation/Gait assistance: Min guard Gait Distance (Feet): 5 Feet Assistive device: Rolling walker (2 wheeled) Gait Pattern/deviations: Step-to pattern     General Gait Details: Pt amb with step-to pattern; reduced step length present as well as slightly antalgic pattern on L LE due to surgical procedure today. Pt. instructed on proper use of RW and demonstrated use safely.  Stairs            Wheelchair Mobility    Modified Rankin (Stroke Patients Only)       Balance Overall balance assessment: Needs assistance Sitting-balance support: Feet supported       Standing balance support: Bilateral upper extremity supported(RW)                                 Pertinent Vitals/Pain Pain Assessment: 0-10 Pain Score: 3  Pain Location: L Knee Pain Descriptors / Indicators: Sore;Tightness Pain Intervention(s): Limited activity within patient's tolerance;Ice applied    Home Living Family/patient expects to be discharged to:: Private residence Living Arrangements: Spouse/significant other Available Help at Discharge: Family Type of Home: House Home Access: Stairs to enter Entrance Stairs-Rails: Can reach both Entrance Stairs-Number of Steps: 5 Home Layout: One level;Able to live on main level with bedroom/bathroom Home Equipment: Bedside commode;Cane - single point;Wheelchair - [employer] - 2 wheels      Prior Function Level of Independence: Independent with assistive device(s)(uses SP cane on "bad days")         Comments: Pt. had L  knee replaced in 2019; struggled to reach full ext following surgery     Hand Dominance        Extremity/Trunk Assessment   Upper Extremity Assessment Upper Extremity Assessment: Overall WFL for tasks assessed    Lower Extremity Assessment Lower Extremity Assessment: Generalized weakness(R LE 5/5; L LE grossly 3+/5)       Communication   Communication: No difficulties  Cognition  Arousal/Alertness: Awake/alert Behavior During Therapy: WFL for tasks assessed/performed Overall Cognitive Status: Within Functional Limits for tasks assessed                                 General Comments: Pt. alert and oriented; responded well to HEP      General Comments      Exercises Total Joint Exercises Goniometric ROM: L knee -8 ext, 91 flex Other Exercises Other Exercises: Pt. performed AP, QS, SLR, Hip ABD/ADD. 10 repetitions; supervision provided   Assessment/Plan    PT Assessment Patient needs continued PT services  PT Problem List Decreased strength;Decreased range of motion;Decreased mobility;Pain;Decreased balance       PT Treatment Interventions Gait training;Stair training;Patient/family education;Therapeutic exercise    PT Goals (Current goals can be found in the Care Plan section)  Acute Rehab PT Goals Patient Stated Goal: to return home PT Goal Formulation: With patient Time For Goal Achievement: 08/29/19 Potential to Achieve Goals: Good Additional Goals Additional Goal #1: Pt will be able to perform bed mobility and transfers with mod independence and LRAD in order to improve functional independence    Frequency BID   Barriers to discharge        Co-evaluation               AM-PAC PT "6 Clicks" Mobility  Outcome Measure Help needed turning from your back to your side while in a flat bed without using bedrails?: None Help needed moving from lying on your back to sitting on the side of a flat bed without using bedrails?: None Help needed moving to and from a bed to a chair (including a wheelchair)?: A Little Help needed standing up from a chair using your arms (e.g., wheelchair or bedside chair)?: A Little Help needed to walk in hospital room?: A Little Help needed climbing 3-5 steps with a railing? : A Little 6 Click Score: 20    End of Session Equipment Utilized During Treatment: Gait belt Activity Tolerance: Patient  tolerated treatment well Patient left: in chair;with family/visitor present;with SCD's reapplied Nurse Communication: Mobility status(In-room whiteboard updated) PT Visit Diagnosis: Difficulty in walking, not elsewhere classified (R26.2);Other abnormalities of gait and mobility (R26.89);Muscle weakness (generalized) (M62.81);Pain Pain - Right/Left: Left Pain - part of body: Knee    Time: [id number] PT Time Calculation (min) (ACUTE ONLY): 42 min   Charges:   PT Evaluation $PT Eval Low Complexity: 1 Low PT Treatments $Therapeutic Exercise: 23-37 mins        Larry Roman, SPT   Ulus Hazen 08/15/2019, 5:19 PM

## 2019-08-15 NOTE — Anesthesia Procedure Notes (Signed)
Spinal  Patient location during procedure: OR Start time: 08/15/2019 8:15 AM End time: 08/15/2019 8:24 AM Staffing Anesthesiologist: Molli Barrows, MD Resident/CRNA: Jerrye Noble, CRNA Performed: resident/CRNA  Preanesthetic Checklist Completed: patient identified, site marked, surgical consent, pre-op evaluation, timeout performed, IV checked, risks and benefits discussed and monitors and equipment checked Spinal Block Patient position: sitting Prep: ChloraPrep Patient monitoring: continuous pulse ox and blood pressure Approach: midline Location: L3-4 Injection technique: single-shot Needle Needle type: Whitacre  Needle gauge: 22 G Assessment Sensory level: T10 Additional Notes R buttock pressure stated with CSF return that resolved quickly.  Dr. Andree Elk at bedside.  Patient denies pain/pressure with slow administration of anesthetic.

## 2019-08-15 NOTE — Progress Notes (Signed)
Ch visited pt in pre-op. Pt shared that he will be having surgery in his L knee and has already had his R knee replaced. Pt shared his concerns related to the post-op regarding the possibility of getting gout again. Ch provided words of encouragement and asked guided questions related to his rehab plan. Pt shared that he hopes to do PT at home due to [place]. Ch understood pt concerns. Ch offered prayer for pt's successful recovery and for his family.   Goals: F/U w/ care team regarding pt's progress; check in on pt to offer more spiritual support as he is a man of faith    08/15/19 0700  Clinical Encounter Type  Visited With Patient  Visit Type Spiritual support;Psychological support;Social support;Pre-op  Recommendations F/U post-op for spiritual support   Spiritual Encounters  Spiritual Needs Prayer;Emotional;Grief support  Stress Factors  Patient Stress Factors Health changes  Family Stress Factors None identified

## 2019-08-15 NOTE — Anesthesia Preprocedure Evaluation (Signed)
Anesthesia Evaluation  Patient identified by MRN, date of birth, ID band Patient awake    Reviewed: Allergy & Precautions, H&P , NPO status , Patient's Chart, lab work & pertinent test results, reviewed documented beta blocker date and time   Airway Mallampati: II   Neck ROM: full    Dental  (+) Poor Dentition   Pulmonary asthma , sleep apnea and Continuous Positive Airway Pressure Ventilation ,    Pulmonary exam normal        Cardiovascular Exercise Tolerance: Poor hypertension, On Medications Normal cardiovascular exam+ dysrhythmias  Rhythm:regular Rate:Normal     Neuro/Psych PSYCHIATRIC DISORDERS Depression negative neurological ROS     GI/Hepatic negative GI ROS, Neg liver ROS,   Endo/Other  negative endocrine ROSdiabetes  Renal/GU negative Renal ROS  negative genitourinary   Musculoskeletal   Abdominal   Peds  Hematology negative hematology ROS (+)   Anesthesia Other Findings Past Medical History: No date: Depression No date: DM (diabetes mellitus) ([place]) No date: Extrinsic asthma, unspecified     Comment:  childhood No date: Hives of unknown origin No date: HTN (hypertension) No date: Other allergy, other than to medicinal agents No date: RBBB No date: Sleep apnea     Comment:  25 years ago mild lost weight no longer uses cpap 08/2018: Wound of right leg     Comment:  area just below knee size of quarter, reddened around               edges Past Surgical History: 1995: BACK SURGERY     Comment:  ruptured disc encapsulated nerves 2016: CATARACT EXTRACTION, BILATERAL     Comment:  Dr. Kerman Passey at [facility] [address] No date: EYE SURGERY; Bilateral     Comment:  cataract extractions No date: JOINT REPLACEMENT; Right     Comment:  total knee No date: TONSILLECTOMY No date: TONSILLECTOMY 09/27/2018: TOTAL KNEE ARTHROPLASTY; Right     Comment:  Procedure: TOTAL KNEE ARTHROPLASTY;  Surgeon:  Thornton Park, MD;  Location: [facility] ORS;  Service: Orthopedics;                Laterality: Right; BMI    Body Mass Index: 29.70 kg/m     Reproductive/Obstetrics negative OB ROS                             Anesthesia Physical Anesthesia Plan  ASA: III  Anesthesia Plan: Spinal   Post-op Pain Management:    Induction:   PONV Risk Score and Plan:   Airway Management Planned:   Additional Equipment:   Intra-op Plan:   Post-operative Plan:   Informed Consent: I have reviewed the patients History and Physical, chart, labs and discussed the procedure including the risks, benefits and alternatives for the proposed anesthesia with the patient or authorized representative who has indicated his/her understanding and acceptance.     Dental Advisory Given  Plan Discussed with: CRNA  Anesthesia Plan Comments:         Anesthesia Quick Evaluation

## 2019-08-15 NOTE — Anesthesia Post-op Follow-up Note (Signed)
Anesthesia QCDR form completed.        

## 2019-08-15 NOTE — Transfer of Care (Signed)
Immediate Anesthesia Transfer of Care Note  Patient: Larry Roman.  Procedure(s) Performed: TOTAL KNEE ARTHROPLASTY (Left Knee)  Patient Location: PACU  Anesthesia Type:Spinal  Level of Consciousness: awake, alert  and oriented  Airway & Oxygen Therapy: Patient Spontanous Breathing  Post-op Assessment: Report given to RN and Post -op Vital signs reviewed and stable  Post vital signs: Reviewed and stable  Last Vitals:  Vitals Value Taken Time  BP 102/57 08/15/19 1118  Temp    Pulse 81 08/15/19 1118  Resp 11 08/15/19 1118  SpO2 94 % 08/15/19 1118  Vitals shown include unvalidated device data.  Last Pain:  Vitals:   08/15/19 0629  TempSrc: Oral  PainSc: 4          Complications: No apparent anesthesia complications

## 2019-08-16 ENCOUNTER — Encounter: Payer: Self-pay | Admitting: Orthopedic Surgery

## 2019-08-16 LAB — GLUCOSE, CAPILLARY
Glucose-Capillary: 174 mg/dL — ABNORMAL HIGH (ref 70–99)
Glucose-Capillary: 163 mg/dL — ABNORMAL HIGH (ref 70–99)
Glucose-Capillary: 126 mg/dL — ABNORMAL HIGH (ref 70–99)
Glucose-Capillary: 135 mg/dL — ABNORMAL HIGH (ref 70–99)

## 2019-08-16 LAB — CBC
HCT: 34.7 % — ABNORMAL LOW (ref 39.0–52.0)
Hemoglobin: 11.3 g/dL — ABNORMAL LOW (ref 13.0–17.0)
MCH: 31.2 pg (ref 26.0–34.0)
MCHC: 32.6 g/dL (ref 30.0–36.0)
MCV: 95.9 fL (ref 80.0–100.0)
Platelets: 231 10*3/uL (ref 150–400)
RBC: 3.62 MIL/uL — ABNORMAL LOW (ref 4.22–5.81)
RDW: 12 % (ref 11.5–15.5)
WBC: 11.5 10*3/uL — ABNORMAL HIGH (ref 4.0–10.5)
nRBC: 0 % (ref 0.0–0.2)

## 2019-08-16 LAB — BASIC METABOLIC PANEL WITH GFR
Anion gap: 6 (ref 5–15)
BUN: 14 mg/dL (ref 8–23)
CO2: 27 mmol/L (ref 22–32)
Calcium: 8.7 mg/dL — ABNORMAL LOW (ref 8.9–10.3)
Chloride: 106 mmol/L (ref 98–111)
Creatinine, Ser: 0.87 mg/dL (ref 0.61–1.24)
GFR calc Af Amer: >60 mL/min
GFR calc non Af Amer: >60 mL/min
Glucose, Bld: 131 mg/dL — ABNORMAL HIGH (ref 70–99)
Potassium: 3.9 mmol/L (ref 3.5–5.1)
Sodium: 139 mmol/L (ref 135–145)

## 2019-08-16 NOTE — Anesthesia Postprocedure Evaluation (Signed)
Anesthesia Post Note  Patient: Taaj Joncas.  Procedure(s) Performed: TOTAL KNEE ARTHROPLASTY (Left Knee)  Patient location during evaluation: Nursing Unit Anesthesia Type: Spinal Level of consciousness: awake, oriented and awake and alert Pain management: satisfactory to patient Vital Signs Assessment: post-procedure vital signs reviewed and stable Respiratory status: spontaneous breathing, respiratory function stable and nonlabored ventilation Cardiovascular status: blood pressure returned to baseline and stable Postop Assessment: no headache and adequate PO intake Anesthetic complications: yes Anesthetic complication details: anesthesia complicationsComments: C/O pain at insertion . Ice pack applied by RN     Last Vitals:  Vitals:   08/15/19 2350 08/16/19 0516  BP: 134/73 (!) 142/68  Pulse: 72 71  Resp: 18 16  Temp: 36.8 C 36.9 C  SpO2: 96% 95%    Last Pain:  Vitals:   08/16/19 0516  TempSrc: Oral  PainSc:                  Johnna Acosta

## 2019-08-16 NOTE — Progress Notes (Signed)
Physical Therapy Treatment Patient Details Name: Larry Roman. MRN: [id number] DOB: 05-Oct-1946 Today's Date: 08/16/2019    History of Present Illness Pt was admitted for L TKR on 08/15/19 as a result of impairments associated with knee OA.    PT Comments    Pt is showing progression toward goals as evidenced by increase in walking distance and increase in the intensity and complexity of therapeutic exercise. He was experiencing increased pain this morning in L knee. The treatment was tolerated well, however pain increased with exercise. Pt. Was able to ambulate 80 feet in session; gait was antalgic with slowed speed. Reliance on UE support increased as a result of the pain. Pt will continue to benefit from improving strength, balance, and AROM.    Follow Up Recommendations  Outpatient PT     Equipment Recommendations  None recommended by PT    Recommendations for Other Services       Precautions / Restrictions Precautions Precautions: Knee Precaution Booklet Issued: Yes (comment) Required Braces or Orthoses: Knee Immobilizer - Left Knee Immobilizer - Left: (on when PT not present) Restrictions Weight Bearing Restrictions: Yes LLE Weight Bearing: Weight bearing as tolerated    Mobility  Bed Mobility Overal bed mobility: Needs Assistance Bed Mobility: Sit to Supine       Sit to supine: Min assist   General bed mobility comments: groggy from medication; needs assist with L knee from sit to supine  Transfers Overall transfer level: Needs assistance Equipment used: Rolling walker (2 wheeled) Transfers: Sit to/from Stand Sit to Stand: Min guard;Min assist         General transfer comment: pt need assistance due to L knee pain   Ambulation/Gait Ambulation/Gait assistance: Min guard(chair follow required due to grogginess) Gait Distance (Feet): 80 Feet Assistive device: Rolling walker (2 wheeled) Gait Pattern/deviations: Step-to pattern     General Gait  Details: Pt. continues to amb with step-to pattern and with reduced step length 2/2 L knee pain. Pt demonstrated improved closeness to walker. Pt walked with excessive knee flexion, reduced speed, and antalgic gait due to surgery. Chair follow required due to grogginess.    Stairs             Wheelchair Mobility    Modified Rankin (Stroke Patients Only)       Balance Overall balance assessment: Needs assistance Sitting-balance support: Feet supported Sitting balance-Leahy Scale: Good Sitting balance - Comments: Pt able to maintain upright posture with feet supported   Standing balance support: Bilateral upper extremity supported Standing balance-Leahy Scale: Fair Standing balance comment: increased reliance on RW for support today 2/2 L knee pain                            Cognition Arousal/Alertness: Awake/alert(sleepy due to medication) Behavior During Therapy: WFL for tasks assessed/performed Overall Cognitive Status: Within Functional Limits for tasks assessed                                        Exercises Total Joint Exercises Goniometric ROM: L knee ext -20 Other Exercises Other Exercises: Pt. performed AP, QS, SLR, Hip ABD/ADD, SAQ. 12 repetitions; min assist provided with SAQ, supervision with all other therex    General Comments        Pertinent Vitals/Pain Pain Assessment: 0-10 Pain Score: 6  Pain Location: L Knee  Pain Descriptors / Indicators: Sore;Tightness;Grimacing;Guarding Pain Intervention(s): Limited activity within patient's tolerance;Monitored during session;Ice applied    Home Living                      Prior Function            PT Goals (current goals can now be found in the care plan section) Acute Rehab PT Goals Patient Stated Goal: to return home PT Goal Formulation: With patient Time For Goal Achievement: 08/29/19 Potential to Achieve Goals: Good Progress towards PT goals: Progressing  toward goals    Frequency    BID      PT Plan Current plan remains appropriate    Co-evaluation              AM-PAC PT "6 Clicks" Mobility   Outcome Measure  Help needed turning from your back to your side while in a flat bed without using bedrails?: None Help needed moving from lying on your back to sitting on the side of a flat bed without using bedrails?: None Help needed moving to and from a bed to a chair (including a wheelchair)?: A Little Help needed standing up from a chair using your arms (e.g., wheelchair or bedside chair)?: A Little Help needed to walk in hospital room?: A Little Help needed climbing 3-5 steps with a railing? : A Little 6 Click Score: 20    End of Session Equipment Utilized During Treatment: Gait belt Activity Tolerance: Patient limited by pain Patient left: in bed;with bed alarm set;with family/visitor present;with SCD's reapplied Nurse Communication: Patient requests pain meds PT Visit Diagnosis: Difficulty in walking, not elsewhere classified (R26.2);Other abnormalities of gait and mobility (R26.89);Muscle weakness (generalized) (M62.81);Pain Pain - Right/Left: Left Pain - part of body: Knee     Time: [id number] PT Time Calculation (min) (ACUTE ONLY): 37 min  Charges:  $Gait Training: 8-22 mins $Therapeutic Exercise: 8-22 mins                     Ernestene Coover, SPT    Keilani Terrance 08/16/2019, 2:55 PM

## 2019-08-16 NOTE — Evaluation (Signed)
Occupational Therapy Evaluation Patient Details Name: Larry Roman. MRN: [id number] DOB: 06-26-1946 Today's Date: 08/16/2019    History of Present Illness Pt was admitted for L TKR on 08/15/19 as a result of impairments associated with knee OA.   Clinical Impression   Pt seen for OT evaluation this date, POD#1 from above surgery. Pt was independent in all ADL prior to surgery but did endorse difficulty with donning socks and was using a SPC for functional mobility on "bad days." Pt is eager to return to PLOF with less pain and improved safety and independence. Pt currently requires minimal assist for LB dressing while in seated position due to pain and limited AROM of L knee. Pt instructed in polar care mgt, falls prevention strategies, home/routines modifications, DME/AE for LB bathing and dressing tasks, and KI mgt. Pt would benefit from skilled OT services including additional instruction in dressing techniques with or without assistive devices for dressing and bathing skills to support recall and carryover prior to discharge and ultimately to maximize safety, independence, and minimize falls risk and caregiver burden. Do not currently anticipate any OT needs following this hospitalization.      Follow Up Recommendations  No OT follow up    Equipment Recommendations  None recommended by OT    Recommendations for Other Services       Precautions / Restrictions Precautions Precautions: Knee;Fall Required Braces or Orthoses: Knee Immobilizer - Left Restrictions Weight Bearing Restrictions: Yes LLE Weight Bearing: Weight bearing as tolerated      Mobility Bed Mobility Overal bed mobility: Modified Independent             General bed mobility comments: extra time/effort and BUE support 2/2 L knee pain  Transfers Overall transfer level: Needs assistance Equipment used: Rolling walker (2 wheeled) Transfers: Sit to/from Stand Sit to Stand: Min guard;Min assist          General transfer comment: pt needed to rock back and forth initially for momentum, initial difficulty coming to full stand 2/2 L knee pain with weightbearing    Balance Overall balance assessment: Needs assistance Sitting-balance support: Feet supported Sitting balance-Leahy Scale: Good     Standing balance support: Bilateral upper extremity supported Standing balance-Leahy Scale: Fair Standing balance comment: heavy reliance on BUE support on RW                           ADL either performed or assessed with clinical judgement   ADL Overall ADL's : Needs assistance/impaired                                       General ADL Comments: Min A for LB ADL, CGA for toilet transfers     Vision Patient Visual Report: No change from baseline       Perception     Praxis      Pertinent Vitals/Pain Pain Assessment: 0-10 Pain Score: 7  Pain Location: L Knee Pain Descriptors / Indicators: Sore;Tightness;Grimacing;Guarding Pain Intervention(s): Limited activity within patient's tolerance;Monitored during session;Premedicated before session;Repositioned;Ice applied     Hand Dominance Right   Extremity/Trunk Assessment Upper Extremity Assessment Upper Extremity Assessment: Overall WFL for tasks assessed   Lower Extremity Assessment Lower Extremity Assessment: LLE deficits/detail LLE Deficits / Details: expected post-op strength/ROM deficits       Communication Communication Communication: No difficulties   Cognition  Arousal/Alertness: Awake/alert Behavior During Therapy: WFL for tasks assessed/performed Overall Cognitive Status: Within Functional Limits for tasks assessed                                     General Comments       Exercises Other Exercises Other Exercises: Pt instructed in polar care mgt, falls prevention strategies, AE/DME, home/routines modifications with handout provided   Shoulder Instructions      Home  Living Family/patient expects to be discharged to:: Private residence Living Arrangements: Spouse/significant other Available Help at Discharge: Family Type of Home: House Home Access: Stairs to enter [employer] of Steps: 5 Entrance Stairs-Rails: Can reach both Home Layout: One level;Able to live on main level with bedroom/bathroom           Bathroom Accessibility: Yes   Home Equipment: Bedside commode;Cane - single point;Wheelchair - manual;Walker - 2 wheels;Hand held shower head;Adaptive equipment Adaptive Equipment: Reacher        Prior Functioning/Environment Level of Independence: Independent with assistive device(s)(uses SP cane on "bad days")        Comments: Pt. had R knee replaced in 2019; struggled to reach full ext following surgery. Pt used SPC on "bad days," indep with ADL.        OT Problem List: Decreased strength;Pain;Decreased range of motion;Impaired balance (sitting and/or standing)      OT Treatment/Interventions: Self-care/ADL training;Therapeutic exercise;Therapeutic activities;DME and/or AE instruction;Patient/family education;Balance training    OT Goals(Current goals can be found in the care plan section) Acute Rehab OT Goals Patient Stated Goal: to return home OT Goal Formulation: With patient Time For Goal Achievement: 08/30/19 Potential to Achieve Goals: Good ADL Goals Pt Will Perform Lower Body Dressing: with supervision;sit to/from stand;with adaptive equipment Pt Will Transfer to Toilet: with supervision;ambulating(BSC over toilet, LRAD for amb) Additional ADL Goal #1: Pt will independently instruct family in polar care mgt  OT Frequency: Min 1X/week   Barriers to D/C:            Co-evaluation              AM-PAC OT "6 Clicks" Daily Activity     Outcome Measure Help from another person eating meals?: None Help from another person taking care of personal grooming?: None Help from another person toileting, which  includes using toliet, bedpan, or urinal?: A Little Help from another person bathing (including washing, rinsing, drying)?: A Little Help from another person to put on and taking off regular upper body clothing?: None Help from another person to put on and taking off regular lower body clothing?: A Little 6 Click Score: 21   End of Session Equipment Utilized During Treatment: Gait belt;Rolling walker  Activity Tolerance: Patient tolerated treatment well Patient left: in chair;with call bell/phone within reach;with chair alarm set;Other (comment)(rolled towel under L ankle, polar care in place; pt declined SCDs and KI)  OT Visit Diagnosis: Other abnormalities of gait and mobility (R26.89);Pain Pain - Right/Left: Left Pain - part of body: Knee                Time: [id number] OT Time Calculation (min): 35 min Charges:  OT General Charges $OT Visit: 1 Visit OT Evaluation $OT Eval Low Complexity: 1 Low OT Treatments $Self Care/Home Management : 8-22 mins $Therapeutic Activity: 8-22 mins  Jeni Salles, MPH, MS, OTR/L ascom [phone] 08/16/19, 10:00 AM

## 2019-08-16 NOTE — Progress Notes (Signed)
Physical Therapy Treatment Patient Details Name: Larry Roman. MRN: [id number] DOB: [birth year] Today's Date: 08/16/2019    History of Present Illness Pt was admitted for L TKR on 08/15/19 as a result of impairments associated with knee OA.    PT Comments    Pt continues to progress toward goals. This afternoon he was mildly groggy due to medication. He is still in a lot of pain, but reduced compared to prior session. He was able to ambulate 80 feet and progress L knee extension measurement from the morning. Ambulation speed was still slow as a result of pain with a step-to pattern. Increased reliance on UE support also continues. Pt will continue to benefit from therapy to address impaired strength, balance, and AROM as well as pain management.    Follow Up Recommendations  Outpatient PT     Equipment Recommendations  None recommended by PT    Recommendations for Other Services       Precautions / Restrictions Precautions Precautions: Knee Precaution Booklet Issued: Yes (comment) Required Braces or Orthoses: Knee Immobilizer - Left Knee Immobilizer - Left: (on when PT not present) Restrictions Weight Bearing Restrictions: Yes LLE Weight Bearing: Weight bearing as tolerated    Mobility  Bed Mobility Overal bed mobility: Needs Assistance Bed Mobility: Sit to Supine       Sit to supine: Min assist   General bed mobility comments: groggy from medication; needs assist with L knee from sit to supine  Transfers Overall transfer level: Needs assistance Equipment used: Rolling walker (2 wheeled) Transfers: Sit to/from Stand Sit to Stand: Min guard;Min assist         General transfer comment: pt need assistance due to L knee pain   Ambulation/Gait Ambulation/Gait assistance: Min guard(chair follow required due to grogginess) Gait Distance (Feet): 80 Feet Assistive device: Rolling walker (2 wheeled) Gait Pattern/deviations: Step-to pattern     General Gait  Details: Pt. continues to amb with step-to pattern and with reduced step length 2/2 L knee pain. Pt demonstrated improved closeness to walker. Pt walked with excessive knee flexion, reduced speed, and antalgic gait due to surgery. Chair follow required due to grogginess.    Stairs             Wheelchair Mobility    Modified Rankin (Stroke Patients Only)       Balance Overall balance assessment: Needs assistance Sitting-balance support: Feet supported Sitting balance-Leahy Scale: Good Sitting balance - Comments: Pt able to maintain upright posture with feet supported   Standing balance support: Bilateral upper extremity supported Standing balance-Leahy Scale: Fair Standing balance comment: increased reliance on RW for support today 2/2 L knee pain                            Cognition Arousal/Alertness: Awake/alert(sleepy due to medication) Behavior During Therapy: WFL for tasks assessed/performed Overall Cognitive Status: Within Functional Limits for tasks assessed                                        Exercises Total Joint Exercises Goniometric ROM: L knee ext -20 Other Exercises Other Exercises: Pt. performed AP, QS, SLR, Hip ABD/ADD, SAQ. 12 repetitions; min assist provided with SAQ, supervision with all other therex    General Comments        Pertinent Vitals/Pain Pain Assessment: 0-10 Pain Score: 6  Pain Location: L Knee Pain Descriptors / Indicators: Sore;Tightness;Grimacing;Guarding Pain Intervention(s): Limited activity within patient's tolerance;Monitored during session;Ice applied    Home Living                      Prior Function            PT Goals (current goals can now be found in the care plan section) Acute Rehab PT Goals Patient Stated Goal: to return home PT Goal Formulation: With patient Time For Goal Achievement: 08/29/19 Potential to Achieve Goals: Good Progress towards PT goals: Progressing  toward goals    Frequency    BID      PT Plan Current plan remains appropriate    Co-evaluation              AM-PAC PT "6 Clicks" Mobility   Outcome Measure  Help needed turning from your back to your side while in a flat bed without using bedrails?: None Help needed moving from lying on your back to sitting on the side of a flat bed without using bedrails?: None Help needed moving to and from a bed to a chair (including a wheelchair)?: A Little Help needed standing up from a chair using your arms (e.g., wheelchair or bedside chair)?: A Little Help needed to walk in hospital room?: A Little Help needed climbing 3-5 steps with a railing? : A Little 6 Click Score: 20    End of Session Equipment Utilized During Treatment: Gait belt Activity Tolerance: Patient limited by pain Patient left: in bed;with bed alarm set;with family/visitor present;with SCD's reapplied Nurse Communication: Patient requests pain meds PT Visit Diagnosis: Difficulty in walking, not elsewhere classified (R26.2);Other abnormalities of gait and mobility (R26.89);Muscle weakness (generalized) (M62.81);Pain Pain - Right/Left: Left Pain - part of body: Knee     Time: [id number] PT Time Calculation (min) (ACUTE ONLY): 37 min  Charges:  $Gait Training: 8-22 mins $Therapeutic Exercise: 8-22 mins                     Larry Roman, SPT   Larry Roman 08/16/2019, 2:55 PM

## 2019-08-16 NOTE — TOC Initial Note (Signed)
Transition of Care (TOC) - Initial/Assessment Note    Patient Details  Name: Larry Roman. MRN: [id number] Date of Birth: Jan 18, 1946  Transition of Care [facility]) CM/SW Contact:    Su Hilt, RN Phone Number: 08/16/2019, 9:59 AM  Clinical Narrative:                 Met with the patient to discuss DC Plan and needs He lives at home with his spouse that provides transportation, He has a RW, WC, BSC and cane at home and does not need additional DME He will be doing Outpatient PT and has appointment scheduled already He is up to date with his PCP  He can afford his medicaitons  Expected Discharge Plan: Home/Self Care Barriers to Discharge: Continued Medical Work up   Patient Goals and CMS Choice Patient states their goals for this hospitalization and ongoing recovery are:: go home      Expected Discharge Plan and Services Expected Discharge Plan: Home/Self Care   Discharge Planning Services: CM Consult   Living arrangements for the past 2 months: Single Family Home                 DME Arranged: N/A         HH Arranged: NA          Prior Living Arrangements/Services Living arrangements for the past 2 months: Single Family Home Lives with:: Spouse Patient language and need for interpreter reviewed:: Yes Do you feel safe going back to the place where you live?: Yes      Need for Family Participation in Patient Care: No (Comment) Care giver support system in place?: Yes (comment) Current home services: DME(RW, WC, BSC, Cane,) Criminal Activity/Legal Involvement Pertinent to Current Situation/Hospitalization: No - Comment as needed  Activities of Daily Living Home Assistive Devices/Equipment: Cane (specify quad or straight) ADL Screening (condition at time of admission) Patient's cognitive ability adequate to safely complete daily activities?: Yes Is the patient deaf or have difficulty hearing?: No Does the patient have difficulty seeing, even when wearing  glasses/contacts?: No Does the patient have difficulty concentrating, remembering, or making decisions?: No Patient able to express need for assistance with ADLs?: Yes Does the patient have difficulty dressing or bathing?: No Independently performs ADLs?: Yes (appropriate for developmental age) Does the patient have difficulty walking or climbing stairs?: No Weakness of Legs: None Weakness of Arms/Hands: None  Permission Sought/Granted                  Emotional Assessment Appearance:: Appears stated age Attitude/Demeanor/Rapport: Engaged Affect (typically observed): Appropriate, Calm Orientation: : Oriented to Self, Oriented to Place, Oriented to  Time, Oriented to Situation Alcohol / Substance Use: Not Applicable Psych Involvement: No (comment)  Admission diagnosis:  M17.12 unilateral primary osteoarthritis left knee Patient Active Problem List   Diagnosis Date Noted  . 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 History of gout 08/07/2019  . Other fatigue 12/17/2018  . Primary osteoarthritis of right knee 10/13/2018  . S/P TKR (total knee replacement) using cement, right 09/27/2018  . OSA (obstructive sleep apnea) 04/16/2018  . Tremor 01/17/2018  . Acute left-sided low back pain without sciatica 11/10/2017  . Sinusitis 04/21/2017  . BPH (benign prostatic hyperplasia) 01/10/2017  . Chronic back pain 08/19/2016  . Chronic pain of both knees 09/14/2015  . Erectile dysfunction 09/10/2012  . Right bundle branch block 09/10/2012  . Hypertension 11/10/2011  . Insomnia 11/10/2011  .  Diabetes mellitus type 2, controlled ([place]) 11/10/2011  . Depression, recurrent ([place]) 05/13/2009   PCP:  Burnard Hawthorne, FNP Pharmacy[facility]   [id number] [place], [place]Phone: [phone]: [phone] [place] [place] [address] [address]Phone: [phone]: [phone]    Social Determinants of Health (SDOH) Interventions    Readmission Risk Interventions No flowsheet data found.

## 2019-08-16 NOTE — TOC Progression Note (Signed)
Transition of Care (TOC) - Progression Note    Patient Details  Name: Larry Roman. MRN: [id number] Date of Birth: [birth year]  Transition of Care [facility]) CM/SW Contact  Su Hilt, RN Phone Number: 08/16/2019, 9:53 AM  Clinical Narrative:     Requested the price for Lovenox, will notify the patient once obtained      Expected Discharge Plan and Services                                                 Social Determinants of Health (SDOH) Interventions    Readmission Risk Interventions No flowsheet data found.

## 2019-08-16 NOTE — Progress Notes (Signed)
Subjective:  POD #1 s/p left TKA.   Patient OOB to a chair.  Patient reports left knee pain as mild.  Patient stated the site of his spinal injection hurt him the most overnight, but this is better now.  He denies CP, SOB, N/V.  Rodman Key, his RN is in the room along with the patient's wife.  Patient has urinated after foley removal.   He has IS at the bedside.  Objective:   VITALS:   Vitals:   08/16/19 0516 08/16/19 0910 08/16/19 1008 08/16/19 1252  BP: (!) 142/68 124/65 128/72 127/62  Pulse: 71 72 80   Resp: 16 18    Temp: 98.4 F (36.9 C) 98.5 F (36.9 C)    TempSrc: Oral Oral    SpO2: 95% 95%    Weight:      Height:        PHYSICAL EXAM: Left lower extremity: Neurovascular intact Sensation intact distally Intact pulses distally Dorsiflexion/Plantar flexion intact Incision: dressing C/D/I Compartment soft  LABS  Results for orders placed or performed during the hospital encounter of 08/15/19 (from the past 24 hour(s))  Glucose, capillary     Status: Abnormal   Collection Time: 08/15/19  5:25 PM  Result Value Ref Range   Glucose-Capillary 165 (H) 70 - 99 mg/dL  CBC     Status: Abnormal   Collection Time: 08/16/19  5:46 AM  Result Value Ref Range   WBC 11.5 (H) 4.0 - 10.5 K/uL   RBC 3.62 (L) 4.22 - 5.81 MIL/uL   Hemoglobin 11.3 (L) 13.0 - 17.0 g/dL   HCT 34.7 (L) 39.0 - 52.0 %   MCV 95.9 80.0 - 100.0 fL   MCH 31.2 26.0 - 34.0 pg   MCHC 32.6 30.0 - 36.0 g/dL   RDW 12.0 11.5 - 15.5 %   Platelets 231 150 - 400 K/uL   nRBC 0.0 0.0 - 0.2 %  Basic metabolic panel     Status: Abnormal   Collection Time: 08/16/19  5:46 AM  Result Value Ref Range   Sodium 139 135 - 145 mmol/L   Potassium 3.9 3.5 - 5.1 mmol/L   Chloride 106 98 - 111 mmol/L   CO2 27 22 - 32 mmol/L   Glucose, Bld 131 (H) 70 - 99 mg/dL   BUN 14 8 - 23 mg/dL   Creatinine, Ser 0.87 0.61 - 1.24 mg/dL   Calcium 8.7 (L) 8.9 - 10.3 mg/dL   GFR calc non Af Amer >60 >60 mL/min   GFR calc Af Amer >60 >60  mL/min   Anion gap 6 5 - 15  Glucose, capillary     Status: Abnormal   Collection Time: 08/16/19  9:54 AM  Result Value Ref Range   Glucose-Capillary 174 (H) 70 - 99 mg/dL   Comment 1 Notify RN   Glucose, capillary     Status: Abnormal   Collection Time: 08/16/19 11:48 AM  Result Value Ref Range   Glucose-Capillary 163 (H) 70 - 99 mg/dL   Comment 1 Notify RN     Dg Knee Left Port  Result Date: 08/15/2019 CLINICAL DATA:  Post total left knee replacement. EXAM: PORTABLE LEFT KNEE - 1-2 VIEW COMPARISON:  None. FINDINGS: Examination demonstrates expected changes post left total knee arthroplasty. Prosthetic components normally located and intact. Skin staples over the anterior soft tissues. IMPRESSION: Expected changes post left total knee arthroplasty. Electronically Signed   By: Marin Olp M.D.   On: 08/15/2019 12:15    Assessment/Plan:  1 Day Post-Op   Active Problems:   S/P TKR (total knee replacement) using cement, left  Patient doing well post-op.   He will start lovenox today.  Continue PT/OT.  Will change dressing tomorrow.      Thornton Park , MD 08/16/2019, 1:05 PM

## 2019-08-16 NOTE — TOC Benefit Eligibility Note (Signed)
Transition of Care [facility]) Benefit Eligibility Note    Patient Details  Name: Larry Roman. MRN: [id number] Date of Birth: January 29, 1946   Medication/Dose: Enoxaparin 40mg  once daily for 14 days  Covered?: Yes  Tier: Other(Tier 4)  Prescription Coverage [facility]  Spoke with Person/Company/Phone Number:: Janett Billow with [employer] at [phone]  Co-Pay: $90 estimated copay  Prior Approval: No  Deductible: (No deductible on plan.)   Dannette Barbara Phone Number: [phone] or [phone] 08/16/2019, 12:40 PM

## 2019-08-16 NOTE — Progress Notes (Signed)
Occupational Therapy Treatment Patient Details Name: Larry Roman. MRN: [id number] DOB: 26-Aug-1946 Today's Date: 08/16/2019    History of present illness Pt was admitted for L TKR on 08/15/19 as a result of impairments associated with knee OA.   OT comments  Pt seen for OT tx this afternoon. Spouse present. Pt/spouse instructed in polar care mgt, falls prevention strategies, and AE/DME for LB dressing with visual demonstration provided. Both verbalized understanding and spouse reports she can provide needed level of assist. Pt continues to benefit from skilled OT services while hospitalized prior to discharge to maximize return to PLOF.    Follow Up Recommendations  No OT follow up    Equipment Recommendations  None recommended by OT    Recommendations for Other Services      Precautions / Restrictions Precautions Precautions: Knee Precaution Booklet Issued: Yes (comment) Required Braces or Orthoses: Knee Immobilizer - Left Knee Immobilizer - Left: (on when PT not present) Restrictions Weight Bearing Restrictions: Yes LLE Weight Bearing: Weight bearing as tolerated       Mobility   Balance    ADL either performed or assessed with clinical judgement   ADL Overall ADL's : Needs assistance/impaired                                       General ADL Comments: Min A for LB ADL, CGA for toilet transfers; wife able to provide needed level of assist     Vision Patient Visual Report: No change from baseline     Perception     Praxis      Cognition Arousal/Alertness: Awake/alert Behavior During Therapy: WFL for tasks assessed/performed Overall Cognitive Status: Within Functional Limits for tasks assessed                                          Exercises Other Exercises: Pt/spouse instructed in polar care mgt, falls prevention strategies, and AE/DME for LB dressing with visual demonstration provided. Both verbalized understanding and  spouse reports she can provide needed level of assist.   Shoulder Instructions       General Comments      Pertinent Vitals/ Pain       Pain Assessment: 0-10 Pain Score: 6  Pain Location: L Knee Pain Descriptors / Indicators: Sore;Tightness;Grimacing;Guarding Pain Intervention(s): Limited activity within patient's tolerance;Monitored during session;Repositioned;Ice applied;Premedicated before session  Home Living                                          Prior Functioning/Environment              Frequency  Min 1X/week        Progress Toward Goals  OT Goals(current goals can now be found in the care plan section)  Progress towards OT goals: Progressing toward goals  Acute Rehab OT Goals Patient Stated Goal: to return home OT Goal Formulation: With patient Time For Goal Achievement: 08/30/19 Potential to Achieve Goals: Good  Plan Discharge plan remains appropriate;Frequency remains appropriate    Co-evaluation                 AM-PAC OT "6 Clicks" Daily Activity     Outcome Measure  Help from another person eating meals?: None Help from another person taking care of personal grooming?: None Help from another person toileting, which includes using toliet, bedpan, or urinal?: A Little Help from another person bathing (including washing, rinsing, drying)?: A Little Help from another person to put on and taking off regular upper body clothing?: None Help from another person to put on and taking off regular lower body clothing?: A Little 6 Click Score: 21    End of Session    OT Visit Diagnosis: Other abnormalities of gait and mobility (R26.89);Pain Pain - Right/Left: Left Pain - part of body: Knee   Activity Tolerance Patient tolerated treatment well   Patient Left in bed;with call bell/phone within reach;with bed alarm set;with family/visitor present;with SCD's reapplied;Other (comment)(polar care and KI in place on LLE)   Nurse  Communication          Time: [phone] OT Time Calculation (min): 10 min  Charges: OT General Charges $OT Visit: 1 Visit OT Treatments $Self Care/Home Management : 8-22 mins  Jeni Salles, MPH, MS, OTR/L ascom [phone] 08/16/19, 4:04 PM

## 2019-08-17 LAB — CBC
HCT: 32.9 % — ABNORMAL LOW (ref 39.0–52.0)
Hemoglobin: 10.8 g/dL — ABNORMAL LOW (ref 13.0–17.0)
MCH: 31.2 pg (ref 26.0–34.0)
MCHC: 32.8 g/dL (ref 30.0–36.0)
MCV: 95.1 fL (ref 80.0–100.0)
Platelets: 219 10*3/uL (ref 150–400)
RBC: 3.46 MIL/uL — ABNORMAL LOW (ref 4.22–5.81)
RDW: 11.9 % (ref 11.5–15.5)
WBC: 11.5 10*3/uL — ABNORMAL HIGH (ref 4.0–10.5)
nRBC: 0 % (ref 0.0–0.2)

## 2019-08-17 LAB — GLUCOSE, CAPILLARY: Glucose-Capillary: 148 mg/dL — ABNORMAL HIGH (ref 70–99)

## 2019-08-17 MED ORDER — ENOXAPARIN SODIUM 40 MG/0.4ML ~~LOC~~ SOLN
40.0000 mg | SUBCUTANEOUS | Status: DC
  Administered 2019-08-18 – 2019-08-19 (×2): 40 mg via SUBCUTANEOUS
  Filled 2019-08-17 (×2): qty 0.4

## 2019-08-17 NOTE — Progress Notes (Signed)
  Subjective:  POD #2 s/p left TKA.   Patient reports left knee pain as mild to moderate.  The patient is up out of bed to a chair.  His wife is at the bedside.  His nurse, Wilhemena Durie, is also at the bedside.  Patient denies chest pain, shortness of breath, nausea or vomiting or abdominal pain.  He states he still has some pain at the site of his spinal anesthetic.  He also is having difficulty sleeping.  Objective:   VITALS:   Vitals:   08/16/19 2359 08/17/19 0737 08/17/19 0923 08/17/19 1206  BP: 117/65 (!) 144/70 (!) 147/78 (!) 143/65  Pulse: 79 82 80 80  Resp: 18 18    Temp: 98.2 F (36.8 C) 98.2 F (36.8 C)    TempSrc: Oral     SpO2: 93% 93%    Weight:      Height:        PHYSICAL EXAM: Left lower extremity: I remove the outer dressings.  The patient's honeycomb dressing has minimal dried blood.  The left knee has no significant swelling, erythema or ecchymosis.  He has no calf tenderness.  His leg compartments are soft and compressible.  He has palpable pedal pulses, intact sensation light touch and intact motor function distally.  He can dorsiflex and plantarflex his ankle.  I looked at the patient's back and do not see any evidence of swelling or redness around the spinal site.  He has a sacral pad in place.   LABS  Results for orders placed or performed during the hospital encounter of 08/15/19 (from the past 24 hour(s))  Glucose, capillary     Status: Abnormal   Collection Time: 08/16/19  4:46 PM  Result Value Ref Range   Glucose-Capillary 126 (H) 70 - 99 mg/dL   Comment 1 Notify RN   Glucose, capillary     Status: Abnormal   Collection Time: 08/16/19  9:42 PM  Result Value Ref Range   Glucose-Capillary 135 (H) 70 - 99 mg/dL   Comment 1 Notify RN   CBC     Status: Abnormal   Collection Time: 08/17/19  3:46 AM  Result Value Ref Range   WBC 11.5 (H) 4.0 - 10.5 K/uL   RBC 3.46 (L) 4.22 - 5.81 MIL/uL   Hemoglobin 10.8 (L) 13.0 - 17.0 g/dL   HCT 32.9 (L) 39.0 - 52.0 %   MCV 95.1 80.0 - 100.0 fL   MCH 31.2 26.0 - 34.0 pg   MCHC 32.8 30.0 - 36.0 g/dL   RDW 11.9 11.5 - 15.5 %   Platelets 219 150 - 400 K/uL   nRBC 0.0 0.0 - 0.2 %    No results found.  Assessment/Plan: 2 Days Post-Op   Active Problems:   S/P TKR (total knee replacement) using cement, left  Patient is making progress postop.  He should continue with physical therapy.  Patient will continue Polar Care on his left knee to reduce swelling.  Patient will use the knee immobilizer at night.  He will continue Lovenox daily.  Possible discharge tomorrow if the patient continues to improve.    Thornton Park , MD 08/17/2019, 2:23 PM

## 2019-08-17 NOTE — Progress Notes (Signed)
Physical Therapy Treatment Patient Details Name: Larry Roman. MRN: [id number] DOB: [birth year] Today's Date: 08/17/2019    History of Present Illness Pt was admitted for L TKR on 08/15/19 as a result of impairments associated with knee OA.    PT Comments    Patient is progressing with PT slowly. He is not able to perform SLR without assist and continues to have high pain with all exercises. ROM is -10 deg-60 deg flex L knee. He needs min assist for all mobility including transfers, bed and gait with RW and KI. He is ambulating 100 feet with RW and KI and min assist and 8/10 pain after pain meds. Patient will continue to benefit from skilled PT to improve mobility .   Follow Up Recommendations  Outpatient PT     Equipment Recommendations  None recommended by PT    Recommendations for Other Services       Precautions / Restrictions Restrictions Weight Bearing Restrictions: Yes LUE Weight Bearing: Weight bearing as tolerated LLE Weight Bearing: Weight bearing as tolerated    Mobility  Bed Mobility Overal bed mobility: Needs Assistance Bed Mobility: Sit to Supine       Sit to supine: Min assist   General bed mobility comments: painful  Transfers Overall transfer level: Needs assistance Equipment used: Rolling walker (2 wheeled)   Sit to Stand: Min guard;Min assist         General transfer comment: pt need assistance due to L knee pain   Ambulation/Gait Ambulation/Gait assistance: Min guard Gait Distance (Feet): 100 Feet Assistive device: Rolling walker (2 wheeled) Gait Pattern/deviations: Step-to pattern     General Gait Details: Pt. continues to amb with step-to pattern and with reduced step length 2/2 L knee pain. Pt demonstrated improved closeness to walker. Pt walked with excessive knee flexion, reduced speed, and antalgic gait due to surgery. Chair follow required due to grogginess.    Stairs             Wheelchair Mobility    Modified Rankin  (Stroke Patients Only)       Balance Overall balance assessment: Needs assistance Sitting-balance support: Feet supported Sitting balance-Leahy Scale: Good Sitting balance - Comments: Pt able to maintain upright posture with feet supported                                    Cognition Arousal/Alertness: Awake/alert Behavior During Therapy: WFL for tasks assessed/performed Overall Cognitive Status: Within Functional Limits for tasks assessed                                 General Comments: Pt. alert and oriented; responded well to HEP      Exercises Total Joint Exercises Ankle Circles/Pumps: AROM;10 reps Quad Sets: AROM;Right;Left;10 reps Gluteal Sets: AROM;Right;Left;10 reps Short Arc Quad: AROM;Left;10 reps Long Arc Quad: AAROM;10 reps Goniometric ROM: -10 deg 60 deg flex    General Comments        Pertinent Vitals/Pain Pain Assessment: 0-10 Pain Score: 8  Pain Location: L Knee Pain Descriptors / Indicators: Sore;Tightness;Grimacing;Guarding Pain Intervention(s): Limited activity within patient's tolerance;Premedicated before session    Home Living                      Prior Function            PT  Goals (current goals can now be found in the care plan section) Acute Rehab PT Goals PT Goal Formulation: With patient Time For Goal Achievement: 08/29/19 Potential to Achieve Goals: Good    Frequency    BID      PT Plan Current plan remains appropriate    Co-evaluation              AM-PAC PT "6 Clicks" Mobility   Outcome Measure  Help needed turning from your back to your side while in a flat bed without using bedrails?: None Help needed moving from lying on your back to sitting on the side of a flat bed without using bedrails?: None Help needed moving to and from a bed to a chair (including a wheelchair)?: A Little Help needed standing up from a chair using your arms (e.g., wheelchair or bedside chair)?: A  Little Help needed to walk in hospital room?: A Little Help needed climbing 3-5 steps with a railing? : A Little 6 Click Score: 20    End of Session Equipment Utilized During Treatment: Gait belt Activity Tolerance: Patient limited by pain Patient left: in bed;with bed alarm set;with family/visitor present;with SCD's reapplied   PT Visit Diagnosis: Difficulty in walking, not elsewhere classified (R26.2);Other abnormalities of gait and mobility (R26.89);Muscle weakness (generalized) (M62.81);Pain Pain - Right/Left: Left Pain - part of body: Knee     Time: 1430-1510 PT Time Calculation (min) (ACUTE ONLY): 40 min  Charges:  $Gait Training: 8-22 mins $Therapeutic Exercise: 23-37 mins                        Alanson Puls, PT DPT 08/17/2019, 4:30 PM

## 2019-08-17 NOTE — Progress Notes (Signed)
Physical Therapy Evaluation Patient Details Name: Larry Roman. MRN: [id number] DOB: 06-04-1946 Today's Date: 08/17/2019   History of Present Illness  Pt was admitted for L TKR on 08/15/19 as a result of impairments associated with knee OA.  Clinical Impression  Patient is having high pain level due to needing pain meds. He is able to perform limited ambulation 100 feet with sBA and AAROM to left knee with high pain to knee and back. He needs min assist for LLE assist getting in and out of bed. Patient will continue to benefit from PT to improve ROM and strength and mobility.     Follow Up Recommendations Outpatient PT    Equipment Recommendations  None recommended by PT    Recommendations for Other Services       Precautions / Restrictions Restrictions Weight Bearing Restrictions: Yes LUE Weight Bearing: Weight bearing as tolerated LLE Weight Bearing: Weight bearing as tolerated      Mobility  Bed Mobility Overal bed mobility: Needs Assistance Bed Mobility: Sit to Supine       Sit to supine: Min assist   General bed mobility comments: groggy from medication; needs assist with L knee from sit to supine  Transfers Overall transfer level: Needs assistance Equipment used: Rolling walker (2 wheeled)   Sit to Stand: Min guard;Min assist         General transfer comment: pt need assistance due to L knee pain   Ambulation/Gait Ambulation/Gait assistance: Min guard Gait Distance (Feet): 100 Feet Assistive device: Rolling walker (2 wheeled) Gait Pattern/deviations: Step-to pattern     General Gait Details: Pt. continues to amb with step-to pattern and with reduced step length 2/2 L knee pain. Pt demonstrated improved closeness to walker. Pt walked with excessive knee flexion, reduced speed, and antalgic gait due to surgery. Chair follow required due to grogginess.   Stairs            Wheelchair Mobility    Modified Rankin (Stroke Patients Only)        Balance Overall balance assessment: Needs assistance Sitting-balance support: Feet supported Sitting balance-Leahy Scale: Good Sitting balance - Comments: Pt able to maintain upright posture with feet supported                                     Pertinent Vitals/Pain Pain Assessment: 0-10 Pain Score: 10-Worst pain ever Pain Location: L Knee Pain Descriptors / Indicators: Sore;Tightness;Grimacing;Guarding Pain Intervention(s): Limited activity within patient's tolerance;Monitored during session    Home Living                        Prior Function                 Hand Dominance        Extremity/Trunk Assessment                Communication      Cognition Arousal/Alertness: Awake/alert Behavior During Therapy: WFL for tasks assessed/performed Overall Cognitive Status: Within Functional Limits for tasks assessed                                 General Comments: Pt. alert and oriented; responded well to HEP      General Comments      Exercises Total Joint Exercises Ankle Circles/Pumps: AROM;10 reps  Quad Sets: AROM;Right;Left;10 reps Gluteal Sets: AROM;Right;Left;10 reps Short Arc Quad: AROM;Left;10 reps Long Arc Quad: AAROM;10 reps   Assessment/Plan    PT Assessment    PT Problem List         PT Treatment Interventions      PT Goals (Current goals can be found in the Care Plan section)  Acute Rehab PT Goals PT Goal Formulation: With patient Time For Goal Achievement: 08/29/19 Potential to Achieve Goals: Good    Frequency BID   Barriers to discharge        Co-evaluation               AM-PAC PT "6 Clicks" Mobility  Outcome Measure Help needed turning from your back to your side while in a flat bed without using bedrails?: None Help needed moving from lying on your back to sitting on the side of a flat bed without using bedrails?: None Help needed moving to and from a bed to a chair  (including a wheelchair)?: A Little Help needed standing up from a chair using your arms (e.g., wheelchair or bedside chair)?: A Little Help needed to walk in hospital room?: A Little Help needed climbing 3-5 steps with a railing? : A Little 6 Click Score: 20    End of Session Equipment Utilized During Treatment: Gait belt Activity Tolerance: Patient limited by pain     PT Visit Diagnosis: Difficulty in walking, not elsewhere classified (R26.2);Other abnormalities of gait and mobility (R26.89);Muscle weakness (generalized) (M62.81);Pain Pain - Right/Left: Left Pain - part of body: Knee    Time: 0930-1010 PT Time Calculation (min) (ACUTE ONLY): 40 min   Charges:     PT Treatments $Gait Training: 8-22 mins $Therapeutic Exercise: 8-22 mins $Therapeutic Activity: 8-22 mins          Arelia Sneddon S, PT DPT 08/17/2019, 11:35 AM

## 2019-08-18 MED ORDER — BISACODYL 5 MG PO TBEC: 5.0000 mg | DELAYED_RELEASE_TABLET | Freq: Every day | ORAL | 0 refills | Status: DC | PRN

## 2019-08-18 MED ORDER — OXYCODONE HCL 5 MG PO TABS: 5.0000 mg | ORAL_TABLET | ORAL | 0 refills | Status: DC | PRN

## 2019-08-18 MED ORDER — ENOXAPARIN SODIUM 40 MG/0.4ML ~~LOC~~ SOLN: 40.0000 mg | SUBCUTANEOUS | 0 refills | Status: DC

## 2019-08-18 MED ORDER — BISACODYL 10 MG RE SUPP
10.0000 mg | Freq: Once | RECTAL | Status: AC
  Administered 2019-08-18: 10 mg via RECTAL
  Filled 2019-08-18: qty 1

## 2019-08-18 NOTE — Progress Notes (Signed)
Physical Therapy Treatment Patient Details Name: Larry Roman. MRN: [id number] DOB: 1946/04/28 Today's Date: 08/18/2019    History of Present Illness Pt was admitted for L TKR on 08/15/19 as a result of impairments associated with knee OA.    PT Comments    Patient needs min assist for supine to sit bed mobility with head of bed elevated and PT supporting his LLE. Patient performs AAROM, AROM and PROM to left LE. AROM -10-55 deg seated, PROM -10 deg- 70 deg seated left knee. Patient is able to perform 10 SLR independently and ambulate 300 feet with KI and RW. Patient will continue to benefit from skilled PT to improve mobility and strength and joint mobility.   Follow Up Recommendations        Equipment Recommendations       Recommendations for Other Services       Precautions / Restrictions Precautions Precautions: Knee Restrictions Weight Bearing Restrictions: Yes LUE Weight Bearing: Weight bearing as tolerated LLE Weight Bearing: Weight bearing as tolerated    Mobility  Bed Mobility Overal bed mobility: Needs Assistance Bed Mobility: Sit to Supine       Sit to supine: Min assist   General bed mobility comments: painful  Transfers Overall transfer level: Needs assistance Equipment used: Rolling walker (2 wheeled)             General transfer comment: pt need assistance due to L knee pain   Ambulation/Gait Ambulation/Gait assistance: Supervision Gait Distance (Feet): 300 Feet Assistive device: Rolling walker (2 wheeled) Gait Pattern/deviations: Step-to pattern     General Gait Details: Pt. continues to amb with step-to pattern and with reduced step length 2/2 L knee pain. Pt demonstrated improved closeness to walker. Pt walked with excessive knee flexion, reduced speed, and antalgic gait due to surgery. Chair follow required due to grogginess.    Stairs             Wheelchair Mobility    Modified Rankin (Stroke Patients Only)        Balance Overall balance assessment: Needs assistance Sitting-balance support: Feet supported Sitting balance-Leahy Scale: Good Sitting balance - Comments: Pt able to maintain upright posture with feet supported       Standing balance comment: increased reliance on RW for support today 2/2 L knee pain                            Cognition Arousal/Alertness: Awake/alert Behavior During Therapy: WFL for tasks assessed/performed Overall Cognitive Status: Within Functional Limits for tasks assessed                                 General Comments: Pt. alert and oriented; responded well to HEP      Exercises Total Joint Exercises Ankle Circles/Pumps: AROM;10 reps Quad Sets: AROM;Right;Left;10 reps Gluteal Sets: AROM;Right;Left;10 reps Short Arc Quad: AROM;Left;10 reps Long Arc Quad: AAROM;10 reps    General Comments        Pertinent Vitals/Pain Pain Assessment: 0-10 Pain Score: 5  Pain Location: L Knee Pain Descriptors / Indicators: Sore;Tightness;Grimacing;Guarding Pain Intervention(s): Limited activity within patient's tolerance;Premedicated before session    Home Living                      Prior Function            PT Goals (current goals can  now be found in the care plan section) Acute Rehab PT Goals Patient Stated Goal: to return home PT Goal Formulation: With patient Time For Goal Achievement: 08/29/19 Potential to Achieve Goals: Good Progress towards PT goals: Progressing toward goals    Frequency    BID      PT Plan Current plan remains appropriate    Co-evaluation              AM-PAC PT "6 Clicks" Mobility   Outcome Measure  Help needed turning from your back to your side while in a flat bed without using bedrails?: None Help needed moving from lying on your back to sitting on the side of a flat bed without using bedrails?: A Little Help needed moving to and from a bed to a chair (including a  wheelchair)?: A Little Help needed standing up from a chair using your arms (e.g., wheelchair or bedside chair)?: A Little Help needed to walk in hospital room?: A Little Help needed climbing 3-5 steps with a railing? : A Lot 6 Click Score: 18    End of Session         PT Visit Diagnosis: Difficulty in walking, not elsewhere classified (R26.2);Other abnormalities of gait and mobility (R26.89);Muscle weakness (generalized) (M62.81);Pain Pain - Right/Left: Left Pain - part of body: Knee     Time: [id number] PT Time Calculation (min) (ACUTE ONLY): 40 min  Charges:  $Gait Training: 8-22 mins $Therapeutic Exercise: 8-22 mins $Therapeutic Activity: 8-22 mins                        Arelia Sneddon S, PT DPT 08/18/2019, 10:40 AM

## 2019-08-18 NOTE — Progress Notes (Signed)
Pt did not do stairs with therapy today, pt has stairs at home.  MD [place] notified. Discharge canceled until tomm.

## 2019-08-18 NOTE — Progress Notes (Signed)
  Subjective:  POD #3 s/p left TKA.   Patient reports left knee pain as moderate.  Patient has not yet had a bowel movement.  Objective:   VITALS:   Vitals:   08/18/19 0822 08/18/19 1009 08/18/19 1010 08/18/19 1216  BP: 133/84 124/84 132/74 124/69  Pulse: 96 92 89 96  Resp: 18   16  Temp: 97.9 F (36.6 C)     TempSrc:      SpO2: 94%   96%  Weight:      Height:        PHYSICAL EXAM: Left lower extremity Neurovascular intact Sensation intact distally Intact pulses distally Dorsiflexion/Plantar flexion intact Incision: scant drainage on dressing No cellulitis present Compartment soft  LABS  Results for orders placed or performed during the hospital encounter of 08/15/19 (from the past 24 hour(s))  Glucose, capillary     Status: Abnormal   Collection Time: 08/17/19  4:22 PM  Result Value Ref Range   Glucose-Capillary 148 (H) 70 - 99 mg/dL   Comment 1 Notify RN     No results found.  Assessment/Plan: 3 Days Post-Op   Active Problems:   S/P TKR (total knee replacement) using cement, left  Patient will continue with physical therapy.  He is still having moderate pain and is requiring narcotic pain medication.  Patient has not yet had a bowel movement.  If his pain is controlled and if he has a bowel movement he may be discharged later today.  If he continues to have pain or is unable to have a bowel movement I would recommend that he stay overnight and be discharged tomorrow.  Patient will follow-up with me in the office in approximately 1 week after discharge.  He will continue on Lovenox injections after discharge.  Patient states he has Lovenox injections at home from his prior surgery and does not require a new prescription.    Thornton Park , MD 08/18/2019, 1:33 PM

## 2019-08-18 NOTE — Discharge Summary (Signed)
Physician Discharge Summary  Patient ID: Larry Roman. MRN: [id number] DOB/AGE: 72-18-1947 72 y.o.  Admit date: 08/15/2019 Discharge date: 08/18/2019  Admission Diagnoses:  M17.12 unilateral primary osteoarthritis left knee <principal problem not specified>  Discharge Diagnoses:  M17.12 unilateral primary osteoarthritis left knee Active Problems:   S/P TKR (total knee replacement) using cement, left   Past Medical History:  Diagnosis Date  . Depression   . DM (diabetes mellitus) ([place])   . Extrinsic asthma, unspecified    childhood  . Hives of unknown origin   . HTN (hypertension)   . Other allergy, other than to medicinal agents   . RBBB   . Sleep apnea    25 years ago mild lost weight no longer uses cpap  . Wound of right leg 08/2018   area just below knee size of quarter, reddened around edges    Surgeries: Procedure(s): TOTAL KNEE ARTHROPLASTY on 08/15/2019   Consultants (if any):   Discharged Condition: Improved  Hospital Course: Larry Roman. is an 73 y.o. male who was admitted 08/15/2019 with a diagnosis of  M17.12 unilateral primary osteoarthritis left knee <principal problem not specified> and went to the operating room on 08/15/2019 and underwent an uncomplicated left total knee arthroplasty.    He was given perioperative antibiotics:  Anti-infectives (From admission, onward)   Start     Dose/Rate Route Frequency Ordered Stop   08/15/19 1500  ceFAZolin (ANCEF) IVPB 1 g/50 mL premix     1 g 100 mL/hr over 30 Minutes Intravenous Every 6 hours 08/15/19 1347 08/15/19 2125   08/15/19 0612  clindamycin (CLEOCIN) 600 MG/50ML IVPB    Note to Pharmacy: Jola Baptist   : cabinet override      08/15/19 0612 08/15/19 1814   08/15/19 0612  ceFAZolin (ANCEF) 2-4 GM/100ML-% IVPB    Note to Pharmacy: Jola Baptist   : cabinet override      08/15/19 0612 08/15/19 1814   08/15/19 0600  ceFAZolin (ANCEF) IVPB 2g/100 mL premix  Status:  Discontinued     2 g 200  mL/hr over 30 Minutes Intravenous On call to O.R. 08/15/19 0359 08/15/19 1339   08/15/19 0400  clindamycin (CLEOCIN) IVPB 600 mg     600 mg 100 mL/hr over 30 Minutes Intravenous  Once 08/15/19 0359 08/15/19 0826    .  He was given sequential compression devices, early ambulation, and lovenox for DVT prophylaxis.  Patient made progress with PT during his hospital stay and his post-op pain improved during his hospitalization.  He benefited maximally from the hospital stay and there were no complications.    Recent vital signs:  Vitals:   08/18/19 1010 08/18/19 1216  BP: 132/74 124/69  Pulse: 89 96  Resp:  16  Temp:    SpO2:  96%    Recent laboratory studies:  Lab Results  Component Value Date   HGB 10.8 (L) 08/17/2019   HGB 11.3 (L) 08/16/2019   HGB 13.7 06/24/2019   Lab Results  Component Value Date   WBC 11.5 (H) 08/17/2019   PLT 219 08/17/2019   Lab Results  Component Value Date   INR 0.9 08/08/2019   Lab Results  Component Value Date   NA 139 08/16/2019   K 3.9 08/16/2019   CL 106 08/16/2019   CO2 27 08/16/2019   BUN 14 08/16/2019   CREATININE 0.87 08/16/2019   GLUCOSE 131 (H) 08/16/2019    Discharge Medications:   Allergies as of 08/18/2019  Reactions   Ibuprofen Other (See Comments)   Ibuprofen and motrin causes bp to elevate      Medication List    STOP taking these medications   hydrocortisone cream 1 %     TAKE these medications   Accu-Chek Softclix Lancets lancets Use up to 4 times daily to check blood sugar. Diagnosis E11.9   acetaminophen 500 MG tablet Commonly known as: TYLENOL Take 1,500 mg by mouth 2 (two) times daily as needed for moderate pain.   allopurinol 100 MG tablet Commonly known as: ZYLOPRIM Take 1 tablet (100 mg total) by mouth daily.   amLODipine 10 MG tablet Commonly known as: NORVASC TAKE 1 TABLET (10 MG TOTAL) BY MOUTH DAILY WITH LUNCH.   aspirin EC 81 MG tablet Take 81 mg by mouth daily.   Azelastine HCl  137 MCG/SPRAY Soln Place 1 spray into both nostrils 2 (two) times daily. What changed:   when to take this  reasons to take this   B-12 5000 MCG Subl Place 5,000 mcg under the tongue daily.   bisacodyl 5 MG EC tablet Commonly known as: DULCOLAX Take 1 tablet (5 mg total) by mouth daily as needed for moderate constipation.   docusate sodium 50 MG capsule Commonly known as: COLACE Take 50 mg by mouth daily.   DULoxetine 30 MG capsule Commonly known as: CYMBALTA Take 1 capsule (30 mg total) by mouth 2 (two) times daily.   enoxaparin 40 MG/0.4ML injection Commonly known as: LOVENOX Inject 0.4 mLs (40 mg total) into the skin daily for 14 days. Start taking on: August 19, 2019   glucose blood test strip Commonly known as: Civil engineer, contracting TEST 3 TIMES A DAY   hydrochlorothiazide 12.5 MG capsule Commonly known as: MICROZIDE TAKE 1 CAPSULE BY MOUTH EVERY DAY What changed: how much to take   loratadine 10 MG tablet Commonly known as: CLARITIN Take 10 mg by mouth daily.   losartan 100 MG tablet Commonly known as: COZAAR TAKE 1 TABLET BY MOUTH EVERY DAY   metFORMIN 500 MG tablet Commonly known as: GLUCOPHAGE TAKE 1 TABLET (500 MG TOTAL) BY MOUTH 2 (TWO) TIMES DAILY WITH A MEAL.   metoprolol succinate 25 MG 24 hr tablet Commonly known as: TOPROL-XL TAKE 1 TABLET BY MOUTH EVERY DAY   NON FORMULARY Diet Type: NCS   oxyCODONE 5 MG immediate release tablet Commonly known as: Oxy IR/ROXICODONE Take 1-2 tablets (5-10 mg total) by mouth every 4 (four) hours as needed for moderate pain (pain score 4-6).   pravastatin 40 MG tablet Commonly known as: PRAVACHOL TAKE 1 TABLET BY MOUTH EVERY DAY What changed: when to take this   Tagamet HB 200 MG tablet Generic drug: cimetidine Take 200 mg by mouth every Monday, Wednesday, and Friday.   tamsulosin 0.4 MG Caps capsule Commonly known as: FLOMAX TAKE 1 CAPSULE BY MOUTH EVERY DAY What changed: when to take this    traZODone 50 MG tablet Commonly known as: DESYREL TAKE 1 TABLET BY MOUTH AT BEDTIME AS NEEDED FOR SLEEP What changed:   when to take this  additional instructions   Vitamin D3 50 MCG (2000 UT) Tabs Take 2,000 Units by mouth daily.       Diagnostic Studies: Dg Knee Left Port  Result Date: 08/15/2019 CLINICAL DATA:  Post total left knee replacement. EXAM: PORTABLE LEFT KNEE - 1-2 VIEW COMPARISON:  None. FINDINGS: Examination demonstrates expected changes post left total knee arthroplasty. Prosthetic components normally located and intact. Skin staples over  the anterior soft tissues. IMPRESSION: Expected changes post left total knee arthroplasty. Electronically Signed   By: Marin Olp M.D.   On: 08/15/2019 12:15    Disposition: Discharge disposition: 01-Home or Self Care       Discharge Instructions    Call MD / Call 911   Complete by: As directed    If you experience chest pain or shortness of breath, CALL 911 and be transported to the hospital emergency room.  If you develope a fever above 101 F, pus (white drainage) or increased drainage or redness at the wound, or calf pain, call your surgeon's office.   Constipation Prevention   Complete by: As directed    Drink plenty of fluids.  Prune juice may be helpful.  You may use a stool softener, such as Colace (over the counter) 100 mg twice a day.  Use MiraLax (over the counter) for constipation as needed.   Diet - low sodium heart healthy   Complete by: As directed    Discharge instructions   Complete by: As directed    Continue WBAT on the left lower extremity 1 month postop. Continue to use TED stockings until follow-up. Patient may remove them at night for sleep. Elevate the left lower extremity whenever possible. Continue to use knee immobilizer at night or when lying in bed or when elevating the operative leg. The patient may remove the knee immobilizer to perform exercises or sit in a chair. Continue using the Polar  Care for comfort. Keep incision clean and dry. Cover the left knee incision during showers with a plastic bag or Saran wrap. Take lovenox 40 mg injection a day for blood clot prevention. Continue to work on knee range of motion exercises at home as instructed by physical therapy. Continue to use a walker for assistance with ambulation until follow-up.   Driving restrictions   Complete by: As directed    No driving until follow up in the office with Dr. Mack Guise   Increase activity slowly as tolerated   Complete by: As directed    Lifting restrictions   Complete by: As directed    No lifting for 12-16 weeks         Signed: Thornton Park ,MD 08/18/2019, 1:44 PM

## 2019-08-19 LAB — GLUCOSE, CAPILLARY: Glucose-Capillary: 145 mg/dL — ABNORMAL HIGH (ref 70–99)

## 2019-08-19 NOTE — Progress Notes (Signed)
Pt remaining alert and oriented. Medicated for pain this morning. Pt able to rest in between care. Surgical dressing dry and intact. Pt stating he had post op bowel movement yesterday.

## 2019-08-19 NOTE — Progress Notes (Signed)
Physical Therapy Treatment Patient Details Name: Larry Roman. MRN: [id number] DOB: 05-Aug-1946 Today's Date: 08/19/2019    History of Present Illness Pt was admitted for L TKR on 08/15/19 as a result of impairments associated with knee OA.    PT Comments    Pt is progressing well toward goals. He is still in significant pain (8/10), but was eager to practice stairs this morning. He stated that he was fatigued this morning as a result of a long walk during session yesterday. Pt was able to perform bed mobility tasks with min assist for surgical leg as well as the use of bedrails. Pt. amb 150 feet with RW; still relies heavily on RW for UE support, but able to perform with CGA from PT. Pt practiced stairs during session; after instruction on proper technique, he safely ascended and descended 4 stairs with the use of both handrails (consistent with home environment) and CGA. Safe car transfer was discussed at the conclusion of the appintment. Pt will still benefit from physical therapy to address impairments of balance, ROM, strength, and pain management. Discharge planning has been updated to [facility].    Follow Up Recommendations  Home health PT     Equipment Recommendations  None recommended by PT    Recommendations for Other Services       Precautions / Restrictions Precautions Precautions: Knee Precaution Booklet Issued: Yes (comment) Required Braces or Orthoses: Knee Immobilizer - Left Knee Immobilizer - Left: Other (comment)(on when PT not present) Restrictions Weight Bearing Restrictions: Yes LUE Weight Bearing: Weight bearing as tolerated LLE Weight Bearing: Weight bearing as tolerated    Mobility  Bed Mobility Overal bed mobility: Needs Assistance(supervision; assistance with surgical leg) Bed Mobility: Supine to Sit     Supine to sit: Min assist     General bed mobility comments: assist with surgical leg; slow transition with excessive use of bedrails; supervision  required  Transfers Overall transfer level: Needs assistance Equipment used: Rolling walker (2 wheeled) Transfers: Sit to/from Stand Sit to Stand: Min guard;Min assist         General transfer comment: Pt needs assistance due to L knee pain;   Ambulation/Gait Ambulation/Gait assistance: Min guard Gait Distance (Feet): 150 Feet Assistive device: Rolling walker (2 wheeled) Gait Pattern/deviations: Step-to pattern;Antalgic     General Gait Details: Pt. amb with step-to pattern; antalgic pattern on L LE due to pain. Chair follow was added due to pt. pain and fatigue; utilized for walk back from gym.    Stairs Stairs: Yes Stairs assistance: Min guard Stair Management: Two rails;Step to pattern;Forwards Number of Stairs: 4 General stair comments: Pt. able to ascend and descend 4 stairs twice during session with min guard required   Wheelchair Mobility    Modified Rankin (Stroke Patients Only)       Balance Overall balance assessment: Needs assistance Sitting-balance support: Feet supported Sitting balance-Leahy Scale: Good Sitting balance - Comments: Pt able to maintain upright posture with feet supported   Standing balance support: Bilateral upper extremity supported Standing balance-Leahy Scale: Fair Standing balance comment: Increased reliance on RW for UE support; breathlessness present as a result of pain and fatigue                            Cognition Arousal/Alertness: Awake/alert Behavior During Therapy: WFL for tasks assessed/performed Overall Cognitive Status: Within Functional Limits for tasks assessed  Exercises Total Joint Exercises Goniometric ROM: L knee AROM -14 ext 70 flex Other Exercises Other Exercises: Pt. performed AP, QS, SLR. 15 repetitions; supervision required and min assist with SLR. Other Exercises: Pt practiced stair amb in order to return home today. Able to ascend and  descend 4 steps twice safely with min assist.      General Comments        Pertinent Vitals/Pain Pain Assessment: 0-10 Pain Score: 8  Pain Location: L Knee Pain Descriptors / Indicators: Sore;Tightness;Grimacing;Guarding Pain Intervention(s): Limited activity within patient's tolerance;Monitored during session;Ice applied    Home Living                      Prior Function            PT Goals (current goals can now be found in the care plan section) Acute Rehab PT Goals Patient Stated Goal: to return home PT Goal Formulation: With patient Time For Goal Achievement: 08/29/19 Potential to Achieve Goals: Good Progress towards PT goals: Not progressing toward goals - comment    Frequency    BID      PT Plan Discharge plan needs to be updated    Co-evaluation              AM-PAC PT "6 Clicks" Mobility   Outcome Measure  Help needed turning from your back to your side while in a flat bed without using bedrails?: None Help needed moving from lying on your back to sitting on the side of a flat bed without using bedrails?: A Little Help needed moving to and from a bed to a chair (including a wheelchair)?: A Little Help needed standing up from a chair using your arms (e.g., wheelchair or bedside chair)?: A Little Help needed to walk in hospital room?: A Little Help needed climbing 3-5 steps with a railing? : A Little 6 Click Score: 19    End of Session Equipment Utilized During Treatment: Gait belt Activity Tolerance: Patient limited by pain Patient left: in chair;with chair alarm set;with SCD's reapplied(KI in place)   PT Visit Diagnosis: Difficulty in walking, not elsewhere classified (R26.2);Other abnormalities of gait and mobility (R26.89);Muscle weakness (generalized) (M62.81);Pain Pain - Right/Left: Left Pain - part of body: Knee     Time: [id number] PT Time Calculation (min) (ACUTE ONLY): 43 min  Charges:  $Therapeutic Exercise: 8-22  mins $Therapeutic Activity: 23-37 mins                     Vashti Bolanos, SPT    Kesia Dalto 08/19/2019, 10:49 AM

## 2019-08-19 NOTE — Care Management Important Message (Signed)
Important Message  Patient Details  Name: Larry Roman. MRN: [id number] Date of Birth: 28-Feb-1946   Medicare Important Message Given:  Yes     [name] 08/19/2019, 11:34 AM

## 2019-08-19 NOTE — Progress Notes (Signed)
Pt discharged with follow up appts and instructions for medication no questions pushed to car without incident

## 2019-08-23 DIAGNOSIS — M25662 Stiffness of left knee, not elsewhere classified: Secondary | ICD-10-CM | POA: Diagnosis not present

## 2019-08-23 DIAGNOSIS — M25562 Pain in left knee: Secondary | ICD-10-CM | POA: Diagnosis not present

## 2019-08-23 DIAGNOSIS — R262 Difficulty in walking, not elsewhere classified: Secondary | ICD-10-CM | POA: Diagnosis not present

## 2019-08-23 DIAGNOSIS — M25561 Pain in right knee: Secondary | ICD-10-CM | POA: Diagnosis not present

## 2019-08-23 DIAGNOSIS — M6281 Muscle weakness (generalized): Secondary | ICD-10-CM | POA: Diagnosis not present

## 2019-08-28 DIAGNOSIS — Z96652 Presence of left artificial knee joint: Secondary | ICD-10-CM | POA: Diagnosis not present

## 2019-08-28 DIAGNOSIS — M25562 Pain in left knee: Secondary | ICD-10-CM | POA: Diagnosis not present

## 2019-08-28 DIAGNOSIS — M25662 Stiffness of left knee, not elsewhere classified: Secondary | ICD-10-CM | POA: Diagnosis not present

## 2019-08-30 ENCOUNTER — Other Ambulatory Visit: Payer: Self-pay | Admitting: Family

## 2019-08-30 DIAGNOSIS — M25662 Stiffness of left knee, not elsewhere classified: Secondary | ICD-10-CM | POA: Diagnosis not present

## 2019-08-30 DIAGNOSIS — Z8739 Personal history of other diseases of the musculoskeletal system and connective tissue: Secondary | ICD-10-CM

## 2019-08-30 DIAGNOSIS — M25562 Pain in left knee: Secondary | ICD-10-CM | POA: Diagnosis not present

## 2019-09-02 DIAGNOSIS — M25662 Stiffness of left knee, not elsewhere classified: Secondary | ICD-10-CM | POA: Diagnosis not present

## 2019-09-02 DIAGNOSIS — M25562 Pain in left knee: Secondary | ICD-10-CM | POA: Diagnosis not present

## 2019-09-04 DIAGNOSIS — M25662 Stiffness of left knee, not elsewhere classified: Secondary | ICD-10-CM | POA: Diagnosis not present

## 2019-09-04 DIAGNOSIS — M25562 Pain in left knee: Secondary | ICD-10-CM | POA: Diagnosis not present

## 2019-09-09 DIAGNOSIS — M25562 Pain in left knee: Secondary | ICD-10-CM | POA: Diagnosis not present

## 2019-09-09 DIAGNOSIS — M25662 Stiffness of left knee, not elsewhere classified: Secondary | ICD-10-CM | POA: Diagnosis not present

## 2019-09-10 ENCOUNTER — Other Ambulatory Visit: Payer: Self-pay | Admitting: Family

## 2019-09-11 DIAGNOSIS — M25662 Stiffness of left knee, not elsewhere classified: Secondary | ICD-10-CM | POA: Diagnosis not present

## 2019-09-11 DIAGNOSIS — M25562 Pain in left knee: Secondary | ICD-10-CM | POA: Diagnosis not present

## 2019-09-11 DIAGNOSIS — M25561 Pain in right knee: Secondary | ICD-10-CM | POA: Diagnosis not present

## 2019-09-11 DIAGNOSIS — M6281 Muscle weakness (generalized): Secondary | ICD-10-CM | POA: Diagnosis not present

## 2019-09-11 DIAGNOSIS — R262 Difficulty in walking, not elsewhere classified: Secondary | ICD-10-CM | POA: Diagnosis not present

## 2019-09-12 NOTE — Telephone Encounter (Signed)
Need to clarify how he is taking the cymbalta.  Per previous note, pt was decreasing to 30mg  q day.  If so, should have pills left.  Just need to clarify before sending in medication.  Just let me know.

## 2019-09-12 NOTE — Telephone Encounter (Signed)
Patient stated that this must have just been generated by the pharmacy. He still a month of medication left. He takes one capsule twice a day.

## 2019-09-16 DIAGNOSIS — R262 Difficulty in walking, not elsewhere classified: Secondary | ICD-10-CM | POA: Diagnosis not present

## 2019-09-16 DIAGNOSIS — M6281 Muscle weakness (generalized): Secondary | ICD-10-CM | POA: Diagnosis not present

## 2019-09-16 DIAGNOSIS — M25562 Pain in left knee: Secondary | ICD-10-CM | POA: Diagnosis not present

## 2019-09-16 DIAGNOSIS — M25561 Pain in right knee: Secondary | ICD-10-CM | POA: Diagnosis not present

## 2019-09-16 DIAGNOSIS — M25662 Stiffness of left knee, not elsewhere classified: Secondary | ICD-10-CM | POA: Diagnosis not present

## 2019-09-19 ENCOUNTER — Other Ambulatory Visit: Payer: Self-pay | Admitting: Family

## 2019-09-19 DIAGNOSIS — M25662 Stiffness of left knee, not elsewhere classified: Secondary | ICD-10-CM | POA: Diagnosis not present

## 2019-09-19 DIAGNOSIS — E785 Hyperlipidemia, unspecified: Secondary | ICD-10-CM

## 2019-09-19 DIAGNOSIS — F339 Major depressive disorder, recurrent, unspecified: Secondary | ICD-10-CM

## 2019-09-19 DIAGNOSIS — R262 Difficulty in walking, not elsewhere classified: Secondary | ICD-10-CM | POA: Diagnosis not present

## 2019-09-19 DIAGNOSIS — M25561 Pain in right knee: Secondary | ICD-10-CM | POA: Diagnosis not present

## 2019-09-19 DIAGNOSIS — M6281 Muscle weakness (generalized): Secondary | ICD-10-CM | POA: Diagnosis not present

## 2019-09-19 DIAGNOSIS — I10 Essential (primary) hypertension: Secondary | ICD-10-CM

## 2019-09-19 DIAGNOSIS — M25562 Pain in left knee: Secondary | ICD-10-CM | POA: Diagnosis not present

## 2019-10-01 DIAGNOSIS — R262 Difficulty in walking, not elsewhere classified: Secondary | ICD-10-CM | POA: Diagnosis not present

## 2019-10-01 DIAGNOSIS — M25561 Pain in right knee: Secondary | ICD-10-CM | POA: Diagnosis not present

## 2019-10-01 DIAGNOSIS — M25562 Pain in left knee: Secondary | ICD-10-CM | POA: Diagnosis not present

## 2019-10-01 DIAGNOSIS — M25662 Stiffness of left knee, not elsewhere classified: Secondary | ICD-10-CM | POA: Diagnosis not present

## 2019-10-01 DIAGNOSIS — M6281 Muscle weakness (generalized): Secondary | ICD-10-CM | POA: Diagnosis not present

## 2019-10-03 DIAGNOSIS — M6281 Muscle weakness (generalized): Secondary | ICD-10-CM | POA: Diagnosis not present

## 2019-10-03 DIAGNOSIS — M25662 Stiffness of left knee, not elsewhere classified: Secondary | ICD-10-CM | POA: Diagnosis not present

## 2019-10-03 DIAGNOSIS — M25562 Pain in left knee: Secondary | ICD-10-CM | POA: Diagnosis not present

## 2019-10-03 DIAGNOSIS — R262 Difficulty in walking, not elsewhere classified: Secondary | ICD-10-CM | POA: Diagnosis not present

## 2019-10-03 DIAGNOSIS — M25561 Pain in right knee: Secondary | ICD-10-CM | POA: Diagnosis not present

## 2019-10-07 DIAGNOSIS — M6281 Muscle weakness (generalized): Secondary | ICD-10-CM | POA: Diagnosis not present

## 2019-10-07 DIAGNOSIS — R262 Difficulty in walking, not elsewhere classified: Secondary | ICD-10-CM | POA: Diagnosis not present

## 2019-10-07 DIAGNOSIS — M25662 Stiffness of left knee, not elsewhere classified: Secondary | ICD-10-CM | POA: Diagnosis not present

## 2019-10-07 DIAGNOSIS — M25561 Pain in right knee: Secondary | ICD-10-CM | POA: Diagnosis not present

## 2019-10-07 DIAGNOSIS — M25562 Pain in left knee: Secondary | ICD-10-CM | POA: Diagnosis not present

## 2019-10-09 DIAGNOSIS — R262 Difficulty in walking, not elsewhere classified: Secondary | ICD-10-CM | POA: Diagnosis not present

## 2019-10-09 DIAGNOSIS — M25561 Pain in right knee: Secondary | ICD-10-CM | POA: Diagnosis not present

## 2019-10-09 DIAGNOSIS — M25562 Pain in left knee: Secondary | ICD-10-CM | POA: Diagnosis not present

## 2019-10-09 DIAGNOSIS — M25662 Stiffness of left knee, not elsewhere classified: Secondary | ICD-10-CM | POA: Diagnosis not present

## 2019-10-09 DIAGNOSIS — M6281 Muscle weakness (generalized): Secondary | ICD-10-CM | POA: Diagnosis not present

## 2019-10-10 ENCOUNTER — Ambulatory Visit: Payer: PPO

## 2019-10-15 DIAGNOSIS — M25561 Pain in right knee: Secondary | ICD-10-CM | POA: Diagnosis not present

## 2019-10-15 DIAGNOSIS — R262 Difficulty in walking, not elsewhere classified: Secondary | ICD-10-CM | POA: Diagnosis not present

## 2019-10-15 DIAGNOSIS — M25662 Stiffness of left knee, not elsewhere classified: Secondary | ICD-10-CM | POA: Diagnosis not present

## 2019-10-15 DIAGNOSIS — M25562 Pain in left knee: Secondary | ICD-10-CM | POA: Diagnosis not present

## 2019-10-15 DIAGNOSIS — M6281 Muscle weakness (generalized): Secondary | ICD-10-CM | POA: Diagnosis not present

## 2019-10-18 DIAGNOSIS — R262 Difficulty in walking, not elsewhere classified: Secondary | ICD-10-CM | POA: Diagnosis not present

## 2019-10-18 DIAGNOSIS — M25562 Pain in left knee: Secondary | ICD-10-CM | POA: Diagnosis not present

## 2019-10-18 DIAGNOSIS — M25561 Pain in right knee: Secondary | ICD-10-CM | POA: Diagnosis not present

## 2019-10-18 DIAGNOSIS — M6281 Muscle weakness (generalized): Secondary | ICD-10-CM | POA: Diagnosis not present

## 2019-10-18 DIAGNOSIS — M25662 Stiffness of left knee, not elsewhere classified: Secondary | ICD-10-CM | POA: Diagnosis not present

## 2019-10-22 DIAGNOSIS — M6281 Muscle weakness (generalized): Secondary | ICD-10-CM | POA: Diagnosis not present

## 2019-10-22 DIAGNOSIS — M25561 Pain in right knee: Secondary | ICD-10-CM | POA: Diagnosis not present

## 2019-10-22 DIAGNOSIS — M25562 Pain in left knee: Secondary | ICD-10-CM | POA: Diagnosis not present

## 2019-10-22 DIAGNOSIS — M25662 Stiffness of left knee, not elsewhere classified: Secondary | ICD-10-CM | POA: Diagnosis not present

## 2019-10-22 DIAGNOSIS — R262 Difficulty in walking, not elsewhere classified: Secondary | ICD-10-CM | POA: Diagnosis not present

## 2019-10-25 ENCOUNTER — Ambulatory Visit: Payer: PPO

## 2019-10-25 DIAGNOSIS — M25662 Stiffness of left knee, not elsewhere classified: Secondary | ICD-10-CM | POA: Diagnosis not present

## 2019-10-25 DIAGNOSIS — M25562 Pain in left knee: Secondary | ICD-10-CM | POA: Diagnosis not present

## 2019-10-25 DIAGNOSIS — M25561 Pain in right knee: Secondary | ICD-10-CM | POA: Diagnosis not present

## 2019-10-25 DIAGNOSIS — M6281 Muscle weakness (generalized): Secondary | ICD-10-CM | POA: Diagnosis not present

## 2019-10-25 DIAGNOSIS — R262 Difficulty in walking, not elsewhere classified: Secondary | ICD-10-CM | POA: Diagnosis not present

## 2019-10-28 DIAGNOSIS — M25561 Pain in right knee: Secondary | ICD-10-CM | POA: Diagnosis not present

## 2019-10-28 DIAGNOSIS — M6281 Muscle weakness (generalized): Secondary | ICD-10-CM | POA: Diagnosis not present

## 2019-10-28 DIAGNOSIS — M25562 Pain in left knee: Secondary | ICD-10-CM | POA: Diagnosis not present

## 2019-10-28 DIAGNOSIS — R262 Difficulty in walking, not elsewhere classified: Secondary | ICD-10-CM | POA: Diagnosis not present

## 2019-10-28 DIAGNOSIS — M25662 Stiffness of left knee, not elsewhere classified: Secondary | ICD-10-CM | POA: Diagnosis not present

## 2019-10-30 ENCOUNTER — Other Ambulatory Visit: Payer: Self-pay

## 2019-10-30 ENCOUNTER — Ambulatory Visit (INDEPENDENT_AMBULATORY_CARE_PROVIDER_SITE_OTHER): Payer: PPO

## 2019-10-30 DIAGNOSIS — M25662 Stiffness of left knee, not elsewhere classified: Secondary | ICD-10-CM | POA: Diagnosis not present

## 2019-10-30 DIAGNOSIS — M25561 Pain in right knee: Secondary | ICD-10-CM | POA: Diagnosis not present

## 2019-10-30 DIAGNOSIS — M25562 Pain in left knee: Secondary | ICD-10-CM | POA: Diagnosis not present

## 2019-10-30 DIAGNOSIS — R262 Difficulty in walking, not elsewhere classified: Secondary | ICD-10-CM | POA: Diagnosis not present

## 2019-10-30 DIAGNOSIS — M6281 Muscle weakness (generalized): Secondary | ICD-10-CM | POA: Diagnosis not present

## 2019-10-30 DIAGNOSIS — Z23 Encounter for immunization: Secondary | ICD-10-CM

## 2019-11-04 DIAGNOSIS — M25662 Stiffness of left knee, not elsewhere classified: Secondary | ICD-10-CM | POA: Diagnosis not present

## 2019-11-04 DIAGNOSIS — R262 Difficulty in walking, not elsewhere classified: Secondary | ICD-10-CM | POA: Diagnosis not present

## 2019-11-04 DIAGNOSIS — M25562 Pain in left knee: Secondary | ICD-10-CM | POA: Diagnosis not present

## 2019-11-04 DIAGNOSIS — M6281 Muscle weakness (generalized): Secondary | ICD-10-CM | POA: Diagnosis not present

## 2019-11-04 DIAGNOSIS — M25561 Pain in right knee: Secondary | ICD-10-CM | POA: Diagnosis not present

## 2019-11-06 DIAGNOSIS — M6281 Muscle weakness (generalized): Secondary | ICD-10-CM | POA: Diagnosis not present

## 2019-11-06 DIAGNOSIS — M25561 Pain in right knee: Secondary | ICD-10-CM | POA: Diagnosis not present

## 2019-11-06 DIAGNOSIS — M25562 Pain in left knee: Secondary | ICD-10-CM | POA: Diagnosis not present

## 2019-11-06 DIAGNOSIS — R262 Difficulty in walking, not elsewhere classified: Secondary | ICD-10-CM | POA: Diagnosis not present

## 2019-11-06 DIAGNOSIS — M25662 Stiffness of left knee, not elsewhere classified: Secondary | ICD-10-CM | POA: Diagnosis not present

## 2019-11-08 ENCOUNTER — Other Ambulatory Visit: Payer: Self-pay | Admitting: Family

## 2019-11-12 DIAGNOSIS — M6281 Muscle weakness (generalized): Secondary | ICD-10-CM | POA: Diagnosis not present

## 2019-11-12 DIAGNOSIS — M25561 Pain in right knee: Secondary | ICD-10-CM | POA: Diagnosis not present

## 2019-11-12 DIAGNOSIS — M25562 Pain in left knee: Secondary | ICD-10-CM | POA: Diagnosis not present

## 2019-11-12 DIAGNOSIS — M25662 Stiffness of left knee, not elsewhere classified: Secondary | ICD-10-CM | POA: Diagnosis not present

## 2019-11-12 DIAGNOSIS — R262 Difficulty in walking, not elsewhere classified: Secondary | ICD-10-CM | POA: Diagnosis not present

## 2019-11-14 DIAGNOSIS — M25562 Pain in left knee: Secondary | ICD-10-CM | POA: Diagnosis not present

## 2019-11-14 DIAGNOSIS — M25662 Stiffness of left knee, not elsewhere classified: Secondary | ICD-10-CM | POA: Diagnosis not present

## 2019-11-14 DIAGNOSIS — R262 Difficulty in walking, not elsewhere classified: Secondary | ICD-10-CM | POA: Diagnosis not present

## 2019-11-14 DIAGNOSIS — M25561 Pain in right knee: Secondary | ICD-10-CM | POA: Diagnosis not present

## 2019-11-14 DIAGNOSIS — M6281 Muscle weakness (generalized): Secondary | ICD-10-CM | POA: Diagnosis not present

## 2019-11-18 DIAGNOSIS — M25562 Pain in left knee: Secondary | ICD-10-CM | POA: Diagnosis not present

## 2019-11-18 DIAGNOSIS — M6281 Muscle weakness (generalized): Secondary | ICD-10-CM | POA: Diagnosis not present

## 2019-11-18 DIAGNOSIS — R262 Difficulty in walking, not elsewhere classified: Secondary | ICD-10-CM | POA: Diagnosis not present

## 2019-11-18 DIAGNOSIS — M25662 Stiffness of left knee, not elsewhere classified: Secondary | ICD-10-CM | POA: Diagnosis not present

## 2019-11-18 DIAGNOSIS — M25561 Pain in right knee: Secondary | ICD-10-CM | POA: Diagnosis not present

## 2019-11-20 DIAGNOSIS — M6281 Muscle weakness (generalized): Secondary | ICD-10-CM | POA: Diagnosis not present

## 2019-11-20 DIAGNOSIS — Z96659 Presence of unspecified artificial knee joint: Secondary | ICD-10-CM | POA: Diagnosis not present

## 2019-11-20 DIAGNOSIS — R262 Difficulty in walking, not elsewhere classified: Secondary | ICD-10-CM | POA: Diagnosis not present

## 2019-11-20 DIAGNOSIS — M25562 Pain in left knee: Secondary | ICD-10-CM | POA: Diagnosis not present

## 2019-11-20 DIAGNOSIS — M25561 Pain in right knee: Secondary | ICD-10-CM | POA: Diagnosis not present

## 2019-11-20 DIAGNOSIS — M25662 Stiffness of left knee, not elsewhere classified: Secondary | ICD-10-CM | POA: Diagnosis not present

## 2019-11-20 DIAGNOSIS — M7632 Iliotibial band syndrome, left leg: Secondary | ICD-10-CM | POA: Diagnosis not present

## 2019-11-26 DIAGNOSIS — R262 Difficulty in walking, not elsewhere classified: Secondary | ICD-10-CM | POA: Diagnosis not present

## 2019-11-26 DIAGNOSIS — M25561 Pain in right knee: Secondary | ICD-10-CM | POA: Diagnosis not present

## 2019-11-26 DIAGNOSIS — M25562 Pain in left knee: Secondary | ICD-10-CM | POA: Diagnosis not present

## 2019-11-26 DIAGNOSIS — M25662 Stiffness of left knee, not elsewhere classified: Secondary | ICD-10-CM | POA: Diagnosis not present

## 2019-11-26 DIAGNOSIS — M6281 Muscle weakness (generalized): Secondary | ICD-10-CM | POA: Diagnosis not present

## 2019-11-28 DIAGNOSIS — M25561 Pain in right knee: Secondary | ICD-10-CM | POA: Diagnosis not present

## 2019-11-28 DIAGNOSIS — M6281 Muscle weakness (generalized): Secondary | ICD-10-CM | POA: Diagnosis not present

## 2019-11-28 DIAGNOSIS — R262 Difficulty in walking, not elsewhere classified: Secondary | ICD-10-CM | POA: Diagnosis not present

## 2019-11-28 DIAGNOSIS — M25562 Pain in left knee: Secondary | ICD-10-CM | POA: Diagnosis not present

## 2019-11-28 DIAGNOSIS — M25662 Stiffness of left knee, not elsewhere classified: Secondary | ICD-10-CM | POA: Diagnosis not present

## 2019-12-02 DIAGNOSIS — M6281 Muscle weakness (generalized): Secondary | ICD-10-CM | POA: Diagnosis not present

## 2019-12-02 DIAGNOSIS — R262 Difficulty in walking, not elsewhere classified: Secondary | ICD-10-CM | POA: Diagnosis not present

## 2019-12-02 DIAGNOSIS — M25562 Pain in left knee: Secondary | ICD-10-CM | POA: Diagnosis not present

## 2019-12-02 DIAGNOSIS — M25561 Pain in right knee: Secondary | ICD-10-CM | POA: Diagnosis not present

## 2019-12-02 DIAGNOSIS — M25662 Stiffness of left knee, not elsewhere classified: Secondary | ICD-10-CM | POA: Diagnosis not present

## 2019-12-03 ENCOUNTER — Other Ambulatory Visit: Payer: Self-pay | Admitting: Family

## 2019-12-04 DIAGNOSIS — M25662 Stiffness of left knee, not elsewhere classified: Secondary | ICD-10-CM | POA: Diagnosis not present

## 2019-12-04 DIAGNOSIS — M25561 Pain in right knee: Secondary | ICD-10-CM | POA: Diagnosis not present

## 2019-12-04 DIAGNOSIS — M6281 Muscle weakness (generalized): Secondary | ICD-10-CM | POA: Diagnosis not present

## 2019-12-04 DIAGNOSIS — M25562 Pain in left knee: Secondary | ICD-10-CM | POA: Diagnosis not present

## 2019-12-04 DIAGNOSIS — R262 Difficulty in walking, not elsewhere classified: Secondary | ICD-10-CM | POA: Diagnosis not present

## 2019-12-09 DIAGNOSIS — M25562 Pain in left knee: Secondary | ICD-10-CM | POA: Diagnosis not present

## 2019-12-09 DIAGNOSIS — M25662 Stiffness of left knee, not elsewhere classified: Secondary | ICD-10-CM | POA: Diagnosis not present

## 2019-12-09 DIAGNOSIS — R262 Difficulty in walking, not elsewhere classified: Secondary | ICD-10-CM | POA: Diagnosis not present

## 2019-12-09 DIAGNOSIS — M25561 Pain in right knee: Secondary | ICD-10-CM | POA: Diagnosis not present

## 2019-12-09 DIAGNOSIS — M6281 Muscle weakness (generalized): Secondary | ICD-10-CM | POA: Diagnosis not present

## 2019-12-10 ENCOUNTER — Ambulatory Visit: Payer: Self-pay | Admitting: Urology

## 2019-12-11 ENCOUNTER — Ambulatory Visit: Payer: Self-pay | Admitting: Urology

## 2019-12-12 DIAGNOSIS — R262 Difficulty in walking, not elsewhere classified: Secondary | ICD-10-CM | POA: Diagnosis not present

## 2019-12-12 DIAGNOSIS — M25662 Stiffness of left knee, not elsewhere classified: Secondary | ICD-10-CM | POA: Diagnosis not present

## 2019-12-12 DIAGNOSIS — M25561 Pain in right knee: Secondary | ICD-10-CM | POA: Diagnosis not present

## 2019-12-12 DIAGNOSIS — M6281 Muscle weakness (generalized): Secondary | ICD-10-CM | POA: Diagnosis not present

## 2019-12-12 DIAGNOSIS — M25562 Pain in left knee: Secondary | ICD-10-CM | POA: Diagnosis not present

## 2019-12-16 DIAGNOSIS — M25561 Pain in right knee: Secondary | ICD-10-CM | POA: Diagnosis not present

## 2019-12-16 DIAGNOSIS — R262 Difficulty in walking, not elsewhere classified: Secondary | ICD-10-CM | POA: Diagnosis not present

## 2019-12-16 DIAGNOSIS — M6281 Muscle weakness (generalized): Secondary | ICD-10-CM | POA: Diagnosis not present

## 2019-12-16 DIAGNOSIS — M25662 Stiffness of left knee, not elsewhere classified: Secondary | ICD-10-CM | POA: Diagnosis not present

## 2019-12-16 DIAGNOSIS — M25562 Pain in left knee: Secondary | ICD-10-CM | POA: Diagnosis not present

## 2019-12-22 ENCOUNTER — Other Ambulatory Visit: Payer: Self-pay | Admitting: Family

## 2019-12-22 DIAGNOSIS — E785 Hyperlipidemia, unspecified: Secondary | ICD-10-CM

## 2019-12-22 DIAGNOSIS — F339 Major depressive disorder, recurrent, unspecified: Secondary | ICD-10-CM

## 2019-12-22 DIAGNOSIS — I10 Essential (primary) hypertension: Secondary | ICD-10-CM

## 2019-12-23 DIAGNOSIS — R262 Difficulty in walking, not elsewhere classified: Secondary | ICD-10-CM | POA: Diagnosis not present

## 2019-12-23 DIAGNOSIS — M25562 Pain in left knee: Secondary | ICD-10-CM | POA: Diagnosis not present

## 2019-12-23 DIAGNOSIS — M25561 Pain in right knee: Secondary | ICD-10-CM | POA: Diagnosis not present

## 2019-12-23 DIAGNOSIS — M25662 Stiffness of left knee, not elsewhere classified: Secondary | ICD-10-CM | POA: Diagnosis not present

## 2019-12-23 DIAGNOSIS — M6281 Muscle weakness (generalized): Secondary | ICD-10-CM | POA: Diagnosis not present

## 2020-01-18 ENCOUNTER — Other Ambulatory Visit: Payer: Self-pay | Admitting: Family

## 2020-01-18 DIAGNOSIS — I10 Essential (primary) hypertension: Secondary | ICD-10-CM

## 2020-01-18 DIAGNOSIS — N400 Enlarged prostate without lower urinary tract symptoms: Secondary | ICD-10-CM

## 2020-01-24 IMAGING — DX DG KNEE 1-2V PORT*R*
2 series · 2 of 2 positions shown · non-contrast
Comparison: None.

CLINICAL DATA: Status post right knee replacement

EXAM:
PORTABLE RIGHT KNEE - 1-2 VIEW

[knee ap]
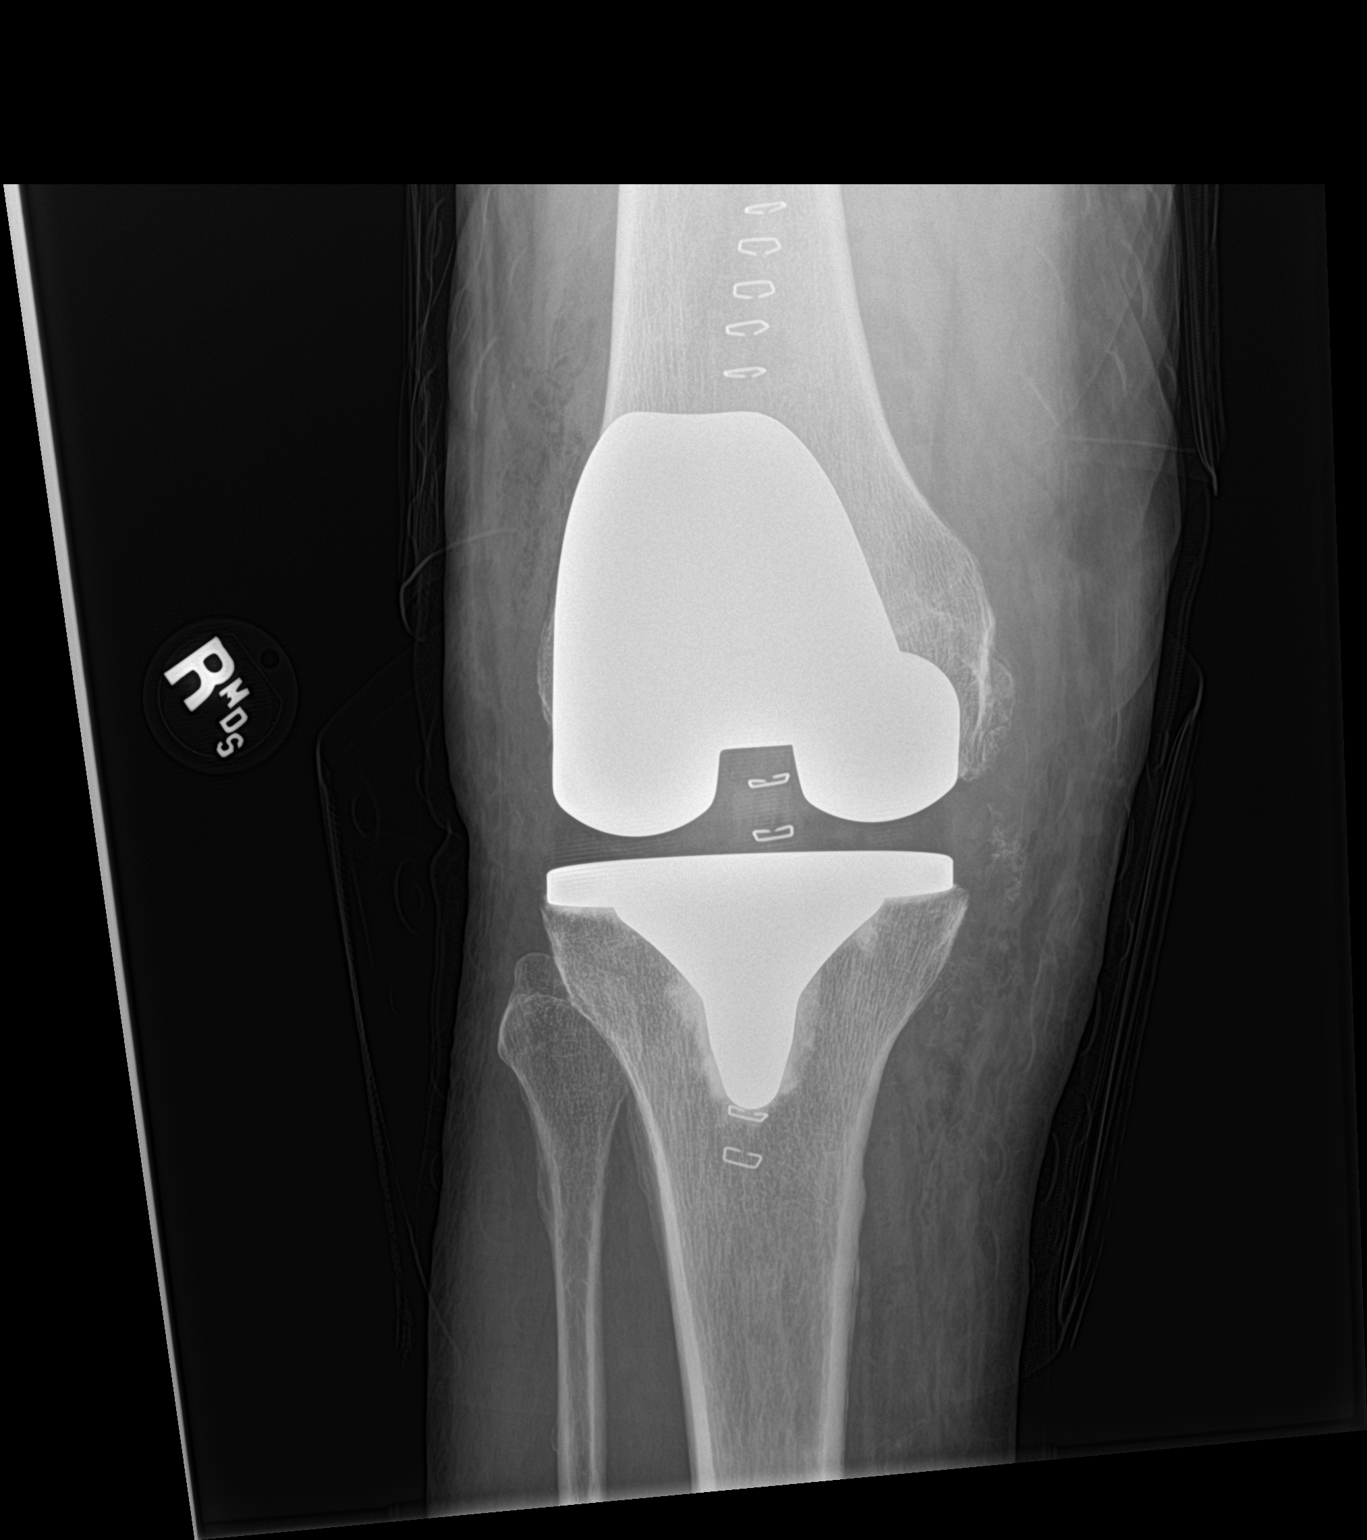

[knee lat]
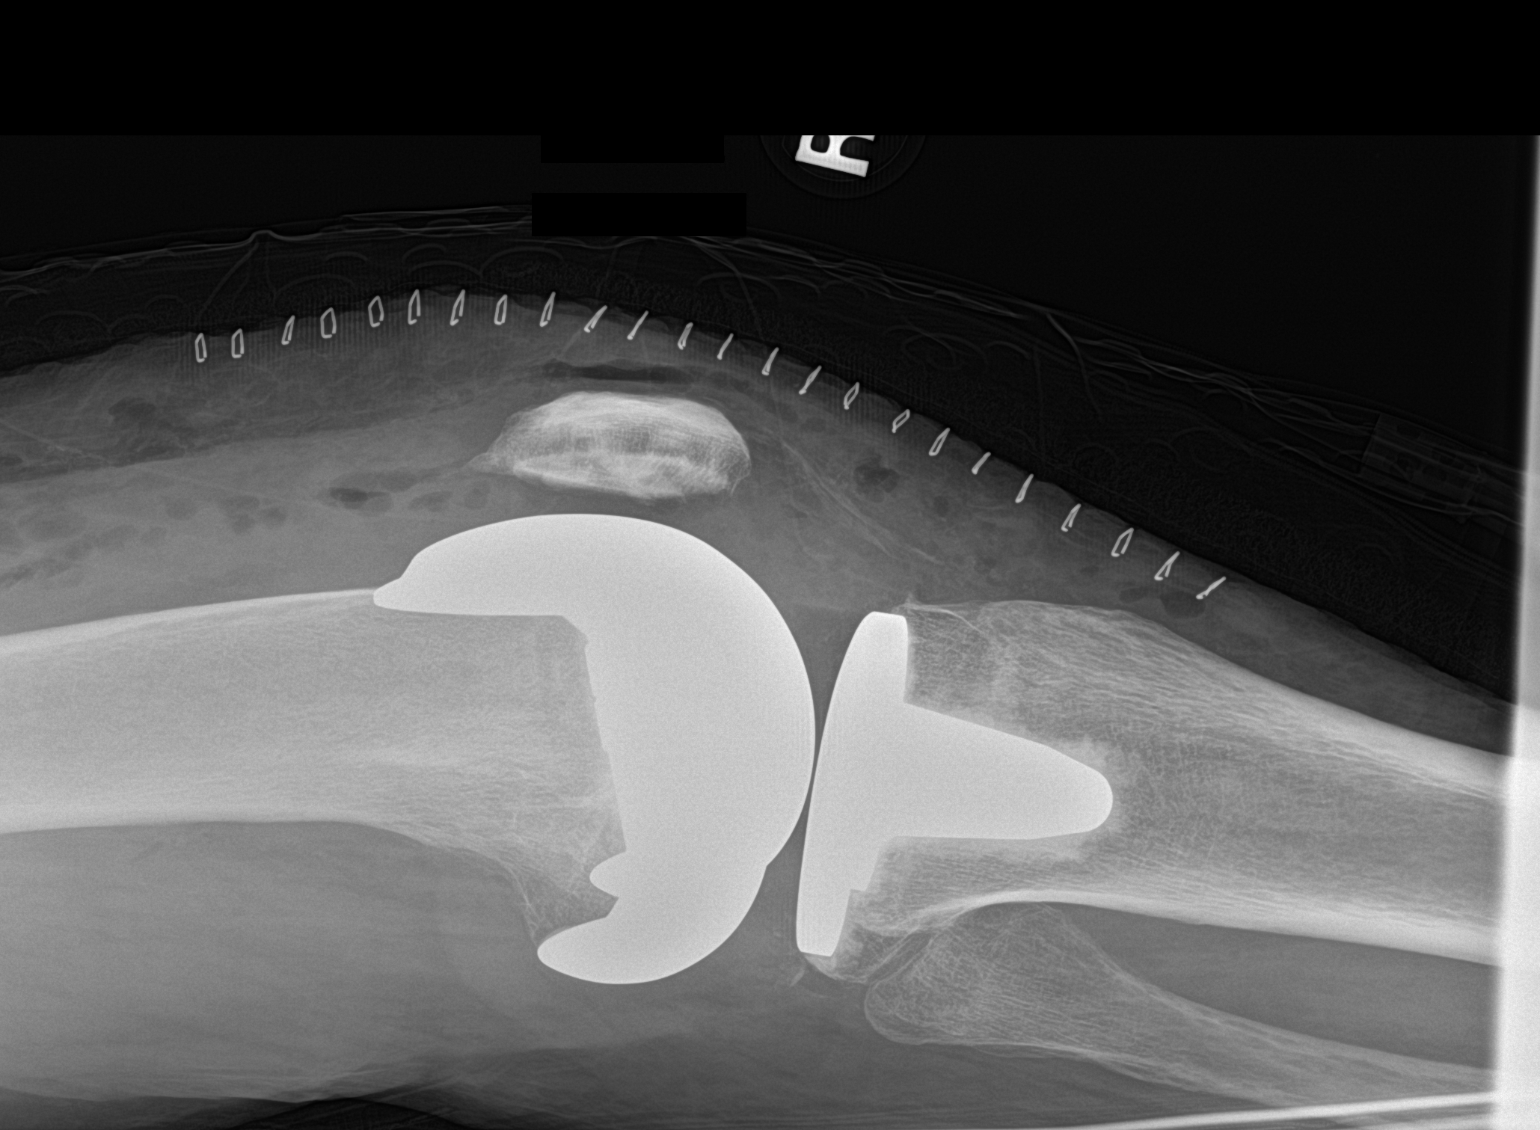

[2 of 2 positions shown; findings below may reference images not displayed]

FINDINGS: Right knee prosthesis is noted in satisfactory position. No acute
bony or soft tissue abnormality is noted.
IMPRESSION: Status post right knee replacement

## 2020-02-05 DIAGNOSIS — M170 Bilateral primary osteoarthritis of knee: Secondary | ICD-10-CM | POA: Diagnosis not present

## 2020-02-05 DIAGNOSIS — M7632 Iliotibial band syndrome, left leg: Secondary | ICD-10-CM | POA: Diagnosis not present

## 2020-02-07 DIAGNOSIS — H26491 Other secondary cataract, right eye: Secondary | ICD-10-CM | POA: Diagnosis not present

## 2020-02-07 LAB — HM DIABETES EYE EXAM

## 2020-02-13 DIAGNOSIS — M25662 Stiffness of left knee, not elsewhere classified: Secondary | ICD-10-CM | POA: Diagnosis not present

## 2020-02-13 DIAGNOSIS — M25562 Pain in left knee: Secondary | ICD-10-CM | POA: Diagnosis not present

## 2020-02-20 DIAGNOSIS — M25662 Stiffness of left knee, not elsewhere classified: Secondary | ICD-10-CM | POA: Diagnosis not present

## 2020-02-20 DIAGNOSIS — M25562 Pain in left knee: Secondary | ICD-10-CM | POA: Diagnosis not present

## 2020-02-28 DIAGNOSIS — M25562 Pain in left knee: Secondary | ICD-10-CM | POA: Diagnosis not present

## 2020-02-28 DIAGNOSIS — M25662 Stiffness of left knee, not elsewhere classified: Secondary | ICD-10-CM | POA: Diagnosis not present

## 2020-03-11 ENCOUNTER — Other Ambulatory Visit: Payer: Self-pay | Admitting: Family

## 2020-03-12 DIAGNOSIS — M25662 Stiffness of left knee, not elsewhere classified: Secondary | ICD-10-CM | POA: Diagnosis not present

## 2020-03-12 DIAGNOSIS — M25562 Pain in left knee: Secondary | ICD-10-CM | POA: Diagnosis not present

## 2020-03-18 DIAGNOSIS — H26491 Other secondary cataract, right eye: Secondary | ICD-10-CM | POA: Diagnosis not present

## 2020-03-19 ENCOUNTER — Other Ambulatory Visit: Payer: Self-pay | Admitting: Family

## 2020-03-19 DIAGNOSIS — I10 Essential (primary) hypertension: Secondary | ICD-10-CM

## 2020-03-20 DIAGNOSIS — M25662 Stiffness of left knee, not elsewhere classified: Secondary | ICD-10-CM | POA: Diagnosis not present

## 2020-03-20 DIAGNOSIS — M25562 Pain in left knee: Secondary | ICD-10-CM | POA: Diagnosis not present

## 2020-03-27 DIAGNOSIS — M25562 Pain in left knee: Secondary | ICD-10-CM | POA: Diagnosis not present

## 2020-03-27 DIAGNOSIS — M25662 Stiffness of left knee, not elsewhere classified: Secondary | ICD-10-CM | POA: Diagnosis not present

## 2020-04-06 DIAGNOSIS — M25662 Stiffness of left knee, not elsewhere classified: Secondary | ICD-10-CM | POA: Diagnosis not present

## 2020-04-06 DIAGNOSIS — L309 Dermatitis, unspecified: Secondary | ICD-10-CM | POA: Diagnosis not present

## 2020-04-06 DIAGNOSIS — M25562 Pain in left knee: Secondary | ICD-10-CM | POA: Diagnosis not present

## 2020-04-13 DIAGNOSIS — M25662 Stiffness of left knee, not elsewhere classified: Secondary | ICD-10-CM | POA: Diagnosis not present

## 2020-04-13 DIAGNOSIS — M25562 Pain in left knee: Secondary | ICD-10-CM | POA: Diagnosis not present

## 2020-04-20 DIAGNOSIS — M25662 Stiffness of left knee, not elsewhere classified: Secondary | ICD-10-CM | POA: Diagnosis not present

## 2020-04-20 DIAGNOSIS — M25562 Pain in left knee: Secondary | ICD-10-CM | POA: Diagnosis not present

## 2020-05-25 DIAGNOSIS — Q141 Congenital malformation of retina: Secondary | ICD-10-CM | POA: Diagnosis not present

## 2020-05-28 ENCOUNTER — Ambulatory Visit: Payer: PPO | Admitting: Urology

## 2020-05-28 ENCOUNTER — Encounter: Payer: Self-pay | Admitting: Urology

## 2020-05-28 ENCOUNTER — Other Ambulatory Visit: Payer: Self-pay

## 2020-05-28 VITALS — BP 143/91 | HR 82 | Ht 72.0 in | Wt 218.0 lb

## 2020-05-28 DIAGNOSIS — N400 Enlarged prostate without lower urinary tract symptoms: Secondary | ICD-10-CM

## 2020-05-28 LAB — BLADDER SCAN AMB NON-IMAGING: Scan Result: 0

## 2020-05-28 MED ORDER — TAMSULOSIN HCL 0.4 MG PO CAPS: 0.8000 mg | ORAL_CAPSULE | Freq: Every day | ORAL | 0 refills | Status: DC

## 2020-05-28 NOTE — Progress Notes (Signed)
05/28/2020 1:46 PM   Larry Roman. [birth year] [id number]  Referring provider: Burnard Hawthorne, FNP [address] [place],  [place] [address]  Chief Complaint  Patient presents with  . Benign Prostatic Hypertrophy    HPI: Larry Roman is a 74 y.o. male seen at the request of Mable Paris, FNP for evaluation of BPH with lower urinary tract symptoms  -History of BPH on tamsulosin -States PCP was concerned about an adequate bladder emptying -He does feel he does not completely empty bladder on occasions -Denies dysuria, gross hematuria -Denies flank, abdominal or pelvic pain -IPSS completed today 17/35     PMH: Past Medical History:  Diagnosis Date  . Depression   . DM (diabetes mellitus) ([place])   . Extrinsic asthma, unspecified    childhood  . Hives of unknown origin   . HTN (hypertension)   . Other allergy, other than to medicinal agents   . RBBB   . Sleep apnea    25 years ago mild lost weight no longer uses cpap  . Wound of right leg 08/2018   area just below knee size of quarter, reddened around edges    Surgical History: Past Surgical History:  Procedure Laterality Date  . BACK SURGERY  1995   ruptured disc encapsulated nerves  . CATARACT EXTRACTION, BILATERAL  2016   Dr. Kerman Passey at [facility]  . EYE SURGERY Bilateral    cataract extractions  . JOINT REPLACEMENT Right    total knee  . TONSILLECTOMY    . TONSILLECTOMY    . TOTAL KNEE ARTHROPLASTY Right 09/27/2018   Procedure: TOTAL KNEE ARTHROPLASTY;  Surgeon: Thornton Park, MD;  Location: [facility] ORS;  Service: Orthopedics;  Laterality: Right;  . TOTAL KNEE ARTHROPLASTY Left 08/15/2019   Procedure: TOTAL KNEE ARTHROPLASTY;  Surgeon: Thornton Park, MD;  Location: [facility] ORS;  Service: Orthopedics;  Laterality: Left;    Home Medications:  Allergies as of 05/28/2020      Reactions   Ibuprofen Other (See Comments)   Ibuprofen and motrin causes bp to elevate        Medication List       Accurate as of May 28, 2020  1:46 PM. If you have any questions, ask your nurse or doctor.        Accu-Chek Softclix Lancets lancets Use up to 4 times daily to check blood sugar. Diagnosis E11.9   acetaminophen 500 MG tablet Commonly known as: TYLENOL Take 1,500 mg by mouth 2 (two) times daily as needed for moderate pain.   allopurinol 100 MG tablet Commonly known as: ZYLOPRIM Take 1 tablet (100 mg total) by mouth daily.   amLODipine 10 MG tablet Commonly known as: NORVASC TAKE 1 TABLET (10 MG TOTAL) BY MOUTH DAILY WITH LUNCH.   aspirin EC 81 MG tablet Take 81 mg by mouth daily.   Azelastine HCl 137 MCG/SPRAY Soln Place 1 spray into both nostrils 2 (two) times daily. What changed:   when to take this  reasons to take this   B-12 5000 MCG Subl Place 5,000 mcg under the tongue daily.   bisacodyl 5 MG EC tablet Commonly known as: DULCOLAX Take 1 tablet (5 mg total) by mouth daily as needed for moderate constipation.   docusate sodium 50 MG capsule Commonly known as: COLACE Take 50 mg by mouth daily.   DULoxetine 30 MG capsule Commonly known as: CYMBALTA TAKE 1 CAPSULE (30 MG TOTAL) BY MOUTH 2 (TWO) TIMES DAILY.  enoxaparin 40 MG/0.4ML injection Commonly known as: LOVENOX Inject 0.4 mLs (40 mg total) into the skin daily for 14 days.   glucose blood test strip Commonly known as: OneTouch Verio TEST 3 TIMES A DAY   hydrochlorothiazide 12.5 MG capsule Commonly known as: MICROZIDE TAKE 1 CAPSULE BY MOUTH EVERY DAY   loratadine 10 MG tablet Commonly known as: CLARITIN Take 10 mg by mouth daily.   losartan 100 MG tablet Commonly known as: COZAAR TAKE 1 TABLET BY MOUTH EVERY DAY   metFORMIN 500 MG tablet Commonly known as: GLUCOPHAGE TAKE 1 TABLET (500 MG TOTAL) BY MOUTH 2 (TWO) TIMES DAILY WITH A MEAL.   metoprolol succinate 25 MG 24 hr tablet Commonly known as: TOPROL-XL TAKE 1 TABLET BY MOUTH EVERY DAY   NON FORMULARY Diet  Type: NCS   oxyCODONE 5 MG immediate release tablet Commonly known as: Oxy IR/ROXICODONE Take 1-2 tablets (5-10 mg total) by mouth every 4 (four) hours as needed for moderate pain (pain score 4-6).   pravastatin 40 MG tablet Commonly known as: PRAVACHOL TAKE 1 TABLET BY MOUTH EVERY DAY   Tagamet HB 200 MG tablet Generic drug: cimetidine Take 200 mg by mouth every Monday, Wednesday, and Friday.   tamsulosin 0.4 MG Caps capsule Commonly known as: FLOMAX TAKE 1 CAPSULE BY MOUTH EVERY DAY   traZODone 50 MG tablet Commonly known as: DESYREL TAKE 1 TABLET BY MOUTH AT BEDTIME AS NEEDED FOR SLEEP   Vitamin D3 50 MCG (2000 UT) Tabs Take 2,000 Units by mouth daily.       Allergies:  Allergies  Allergen Reactions  . Ibuprofen Other (See Comments)    Ibuprofen and motrin causes bp to elevate    Family History: Family History  Problem Relation Age of Onset  . Heart failure Father   . Heart attack Mother     Social History:  reports that he has never smoked. He has never used smokeless tobacco. He reports current alcohol use. He reports that he does not use drugs.   Physical Exam: BP (!) 143/91   Pulse 82   Ht 6' (1.829 m)   Wt 218 lb (98.9 kg)   BMI 29.57 kg/m   Constitutional:  Alert and oriented, No acute distress. HEENT: [place] AT, moist mucus membranes.  Trachea midline, no masses. Cardiovascular: No clubbing, cyanosis, or edema. Respiratory: Normal respiratory effort, no increased work of breathing. GU: Prostate 45 g, smooth without nodules  Skin: No rashes, bruises or suspicious lesions. Neurologic: Grossly intact, no focal deficits, moving all 4 extremities. Psychiatric: Normal mood and affect.  Laboratory Data:  Lab Results  Component Value Date   PSA 0.43 06/24/2019   PSA 0.69 05/24/2017   PSA 0.43 01/14/2014    Assessment & Plan:    1.  BPH with lower urinary tract symptoms -Moderate lower urinary tract symptoms on tamsulosin -PVR by bladder scan  today 0 mL -We discussed further management as based on symptom bother with options of continuing current management, increasing tamsulosin dose, adding a 5-ARI and surgical outlet procedures including minimally invasive options.  He is interested in increasing the tamsulosin and will increase tamsulosin to 0.8 mg daily for the next 30 days.  If effective he will call back for a long-term Rx and follow-up in 1 year.  If symptoms have not improved he will think about other options as above   Abbie Sons, [place] [address] [place], [place] [address] [phone]  [phone]  

## 2020-05-29 LAB — MICROSCOPIC EXAMINATION: Bacteria, UA: NONE SEEN

## 2020-05-29 LAB — URINALYSIS, COMPLETE
Bilirubin, UA: NEGATIVE
Glucose, UA: NEGATIVE
Ketones, UA: NEGATIVE
Leukocytes,UA: NEGATIVE
Nitrite, UA: NEGATIVE
RBC, UA: NEGATIVE
Specific Gravity, UA: 1.02 (ref 1.005–1.030)
Urobilinogen, Ur: 0.2 mg/dL (ref 0.2–1.0)
pH, UA: 5.5 (ref 5.0–7.5)

## 2020-06-14 ENCOUNTER — Other Ambulatory Visit: Payer: Self-pay | Admitting: Family

## 2020-06-14 DIAGNOSIS — I10 Essential (primary) hypertension: Secondary | ICD-10-CM

## 2020-06-28 ENCOUNTER — Other Ambulatory Visit: Payer: Self-pay | Admitting: Family

## 2020-06-28 DIAGNOSIS — F339 Major depressive disorder, recurrent, unspecified: Secondary | ICD-10-CM

## 2020-06-30 ENCOUNTER — Other Ambulatory Visit: Payer: Self-pay

## 2020-07-06 ENCOUNTER — Telehealth: Payer: Self-pay | Admitting: Family

## 2020-07-06 ENCOUNTER — Telehealth: Payer: Self-pay

## 2020-07-06 NOTE — Telephone Encounter (Signed)
Called patient to schedule an appointment to discuss Medication.

## 2020-07-06 NOTE — Telephone Encounter (Signed)
Left message for patient to call back and schedule Medicare Annual Wellness Visit (AWV)   This should be a telephone visit only=30 minutes.  Last AWV 04/25/18; please schedule at anytime with Denisa O'Brien-Blaney at [facility].

## 2020-07-08 NOTE — Telephone Encounter (Signed)
Left message to call back  

## 2020-07-09 NOTE — Telephone Encounter (Signed)
Left message to call back. 3rd attempt to call patient and letter has been sent.

## 2020-07-24 ENCOUNTER — Other Ambulatory Visit: Payer: Self-pay | Admitting: Urology

## 2020-07-24 DIAGNOSIS — N400 Enlarged prostate without lower urinary tract symptoms: Secondary | ICD-10-CM

## 2020-08-18 ENCOUNTER — Other Ambulatory Visit: Payer: Self-pay | Admitting: Urology

## 2020-08-18 DIAGNOSIS — N400 Enlarged prostate without lower urinary tract symptoms: Secondary | ICD-10-CM

## 2020-09-04 ENCOUNTER — Other Ambulatory Visit: Payer: Self-pay | Admitting: Family

## 2020-09-04 DIAGNOSIS — I10 Essential (primary) hypertension: Secondary | ICD-10-CM

## 2020-09-06 ENCOUNTER — Other Ambulatory Visit: Payer: Self-pay | Admitting: Family

## 2020-09-06 DIAGNOSIS — I10 Essential (primary) hypertension: Secondary | ICD-10-CM

## 2020-09-09 ENCOUNTER — Other Ambulatory Visit: Payer: Self-pay | Admitting: Family

## 2020-09-21 ENCOUNTER — Ambulatory Visit (INDEPENDENT_AMBULATORY_CARE_PROVIDER_SITE_OTHER): Payer: PPO | Admitting: Family

## 2020-09-21 ENCOUNTER — Other Ambulatory Visit: Payer: Self-pay

## 2020-09-21 ENCOUNTER — Encounter: Payer: Self-pay | Admitting: Family

## 2020-09-21 ENCOUNTER — Telehealth: Payer: Self-pay

## 2020-09-21 VITALS — BP 120/68 | HR 70 | Temp 98.2°F | Ht 72.0 in | Wt 216.2 lb

## 2020-09-21 DIAGNOSIS — R251 Tremor, unspecified: Secondary | ICD-10-CM | POA: Diagnosis not present

## 2020-09-21 DIAGNOSIS — F339 Major depressive disorder, recurrent, unspecified: Secondary | ICD-10-CM | POA: Diagnosis not present

## 2020-09-21 DIAGNOSIS — E785 Hyperlipidemia, unspecified: Secondary | ICD-10-CM | POA: Diagnosis not present

## 2020-09-21 DIAGNOSIS — Z23 Encounter for immunization: Secondary | ICD-10-CM

## 2020-09-21 DIAGNOSIS — I10 Essential (primary) hypertension: Secondary | ICD-10-CM

## 2020-09-21 DIAGNOSIS — Z7982 Long term (current) use of aspirin: Secondary | ICD-10-CM | POA: Diagnosis not present

## 2020-09-21 DIAGNOSIS — E119 Type 2 diabetes mellitus without complications: Secondary | ICD-10-CM | POA: Diagnosis not present

## 2020-09-21 LAB — LIPID PANEL
Cholesterol: 132 mg/dL (ref 0–200)
HDL: 47.1 mg/dL (ref >39.00)
LDL Cholesterol: 50 mg/dL (ref 0–99)
NonHDL: 84.56
Total CHOL/HDL Ratio: 3
Triglycerides: 171 mg/dL — ABNORMAL HIGH (ref 0.0–149.0)
VLDL: 34.2 mg/dL (ref 0.0–40.0)

## 2020-09-21 LAB — COMPREHENSIVE METABOLIC PANEL
ALT: 26 U/L (ref 0–53)
AST: 23 U/L (ref 0–37)
Albumin: 4.6 g/dL (ref 3.5–5.2)
Alkaline Phosphatase: 69 U/L (ref 39–117)
BUN: 16 mg/dL (ref 6–23)
CO2: 28 mEq/L (ref 19–32)
Calcium: 10.1 mg/dL (ref 8.4–10.5)
Chloride: 100 mEq/L (ref 96–112)
Creatinine, Ser: 0.82 mg/dL (ref 0.40–1.50)
GFR: 87.6 mL/min (ref >60.00)
Glucose, Bld: 116 mg/dL — ABNORMAL HIGH (ref 70–99)
Potassium: 4.2 mEq/L (ref 3.5–5.1)
Sodium: 136 mEq/L (ref 135–145)
Total Bilirubin: 0.7 mg/dL (ref 0.2–1.2)
Total Protein: 7.1 g/dL (ref 6.0–8.3)

## 2020-09-21 LAB — CBC WITH DIFFERENTIAL/PLATELET
Basophils Absolute: 0.1 10*3/uL (ref 0.0–0.1)
Basophils Relative: 1 % (ref 0.0–3.0)
Eosinophils Absolute: 0.3 10*3/uL (ref 0.0–0.7)
Eosinophils Relative: 3.6 % (ref 0.0–5.0)
HCT: 39.5 % (ref 39.0–52.0)
Hemoglobin: 13.6 g/dL (ref 13.0–17.0)
Lymphocytes Relative: 25.3 % (ref 12.0–46.0)
Lymphs Abs: 1.8 10*3/uL (ref 0.7–4.0)
MCHC: 34.3 g/dL (ref 30.0–36.0)
MCV: 93 fl (ref 78.0–100.0)
Monocytes Absolute: 0.6 10*3/uL (ref 0.1–1.0)
Monocytes Relative: 8.1 % (ref 3.0–12.0)
Neutro Abs: 4.4 10*3/uL (ref 1.4–7.7)
Neutrophils Relative %: 62 % (ref 43.0–77.0)
Platelets: 275 10*3/uL (ref 150.0–400.0)
RBC: 4.25 Mil/uL (ref 4.22–5.81)
RDW: 13.1 % (ref 11.5–15.5)
WBC: 7 10*3/uL (ref 4.0–10.5)

## 2020-09-21 LAB — MICROALBUMIN / CREATININE URINE RATIO
Creatinine,U: 119.8 mg/dL
Microalb Creat Ratio: 5.2 mg/g (ref 0.0–30.0)
Microalb, Ur: 6.2 mg/dL — ABNORMAL HIGH (ref 0.0–1.9)

## 2020-09-21 LAB — HEMOGLOBIN A1C: Hgb A1c MFr Bld: 6.5 % (ref 4.6–6.5)

## 2020-09-21 LAB — TSH: TSH: 1.95 u[IU]/mL (ref 0.35–4.50)

## 2020-09-21 MED ORDER — AZELASTINE HCL 0.1 % NA SOLN: 1.0000 | Freq: Two times a day (BID) | NASAL | 4 refills | Status: DC

## 2020-09-21 NOTE — Assessment & Plan Note (Signed)
In primary prevention of CVD , advised against asa use particularly in age > 75yrs due to bleeding risk. Patient declines stopping at this time and verbalized understanding of risk of lethal hemorrhagic stroke, GIB. He will plan to discuss with cardiology as well as referral placed today for overall risk stratification.

## 2020-09-21 NOTE — Patient Instructions (Addendum)
Let me know if tremor worsens  Referral to cardiology Let us know if you dont hear back within a week in regards to an appointment being scheduled.   Advised to stop aspirin. Please let me know what you discuss with Dr Rockey Situ.    At 70 years and above the risk of gastrointestinal bleeding, falls,  hemorrhagic stroke increases therefore becoming a very personal discussion in regards to whether you continue aspirin 81 mg.

## 2020-09-21 NOTE — Assessment & Plan Note (Signed)
Stable. Continue trazodone 50mg . cymbalta 60mg 

## 2020-09-21 NOTE — Assessment & Plan Note (Signed)
Anticipate controlled, pending a1c. Continue metformin 500mg  bid

## 2020-09-21 NOTE — Telephone Encounter (Signed)
lvm to schedule 4 month follow up

## 2020-09-21 NOTE — Assessment & Plan Note (Signed)
Stable, continue toprol 25mg  qd, losartan 100mg , hctz 12.5mg , norvasc 10mg  qd.

## 2020-09-21 NOTE — Assessment & Plan Note (Addendum)
Fine resting tremor. Worsened with increase of cymbalta. Advised to move to another agent such as prozac which he has been on in the past as tremor is not a listed adverse effect. He declines at this time and would like to trial decreasing dose after the winter when his depressive symptoms are more bothersome.

## 2020-09-21 NOTE — Progress Notes (Signed)
Subjective:    Patient ID: Larry Roman., male    DOB: 06-05-1946, 74 y.o.   MRN: [id number]  CC: Larry Roman. is a 74 y.o. male who presents today for follow up.   HPI: Complains of tremor in right hand, for 2 years, worsened. Not bothersome. Able to drink, eat without a problem. Most noticeable when extends arms forward.  Had thought it was related to cymbalta and noticed tremor worsened when cymbalta increased from 30mg  to 60mg   Otherwise feels well. Knee pain has improved greatly. He works in yard and no sob, cp with physical activity.   NO falls. No balance issues. No family h/o tremor, parkinson's.  HTN- compliant with hctz, losartan, toprol    HLD-compliant with pravachol  Dm- compliant with metformin   Depression- compliant cymbalta. Depression is seasonal.  No trouble sleeping and feels well with trazodone.  Has been prozac as well wellbutrin in the past.   Takes asa 81mg . No h/o gib. Parents had MIs. No family h/o colon cancer. No personal h/o bypass, known CAD.  H/o RBBB   follows with dr Bernardo Heater for BPH and tamsulosin S/p right TKA 09/2018 and left TKA 08/2019  HISTORY:  Past Medical History:  Diagnosis Date  . Depression   . DM (diabetes mellitus) ([place])   . Extrinsic asthma, unspecified    childhood  . Hives of unknown origin   . HTN (hypertension)   . Other allergy, other than to medicinal agents   . RBBB   . Sleep apnea    25 years ago mild lost weight no longer uses cpap  . Wound of right leg 08/2018   area just below knee size of quarter, reddened around edges   Past Surgical History:  Procedure Laterality Date  . BACK SURGERY  1995   ruptured disc encapsulated nerves  . CATARACT EXTRACTION, BILATERAL  2016   Dr. Kerman Passey at [facility]  . EYE SURGERY Bilateral    cataract extractions  . JOINT REPLACEMENT Right    total knee  . TONSILLECTOMY    . TONSILLECTOMY    . TOTAL KNEE ARTHROPLASTY Right 09/27/2018   Procedure:  TOTAL KNEE ARTHROPLASTY;  Surgeon: Thornton Park, MD;  Location: [facility] ORS;  Service: Orthopedics;  Laterality: Right;  . TOTAL KNEE ARTHROPLASTY Left 08/15/2019   Procedure: TOTAL KNEE ARTHROPLASTY;  Surgeon: Thornton Park, MD;  Location: [facility] ORS;  Service: Orthopedics;  Laterality: Left;   Family History  Problem Relation Age of Onset  . Heart failure Father   . Heart attack Mother     Allergies: Ibuprofen Current Outpatient Medications on File Prior to Visit  Medication Sig Dispense Refill  . ACCU-CHEK SOFTCLIX LANCETS lancets Use up to 4 times daily to check blood sugar. Diagnosis E11.9 100 each 12  . acetaminophen (TYLENOL) 500 MG tablet Take 1,500 mg by mouth 2 (two) times daily as needed for moderate pain.    Marland Kitchen amLODipine (NORVASC) 10 MG tablet TAKE 1 TABLET (10 MG TOTAL) BY MOUTH DAILY WITH LUNCH. 90 tablet 1  . aspirin EC 81 MG tablet Take 81 mg by mouth daily.      . Cholecalciferol (VITAMIN D3) 2000 UNITS TABS Take 2,000 Units by mouth daily.     . cimetidine (TAGAMET HB) 200 MG tablet Take 200 mg by mouth every Monday, Wednesday, and Friday.     . Cyanocobalamin (B-12) 5000 MCG SUBL Place 5,000 mcg under the tongue daily.     Marland Kitchen  docusate sodium (COLACE) 50 MG capsule Take 50 mg by mouth daily.    . DULoxetine (CYMBALTA) 30 MG capsule TAKE 1 CAPSULE (30 MG TOTAL) BY MOUTH 2 (TWO) TIMES DAILY. 180 capsule 2  . glucose blood (ONETOUCH VERIO) test strip TEST 3 TIMES A DAY 100 each 0  . hydrochlorothiazide (MICROZIDE) 12.5 MG capsule TAKE 1 CAPSULE BY MOUTH EVERY DAY 90 capsule 0  . loratadine (CLARITIN) 10 MG tablet Take 10 mg by mouth daily.    Marland Kitchen losartan (COZAAR) 100 MG tablet TAKE 1 TABLET BY MOUTH EVERY DAY 90 tablet 1  . metFORMIN (GLUCOPHAGE) 500 MG tablet TAKE 1 TABLET (500 MG TOTAL) BY MOUTH 2 (TWO) TIMES DAILY WITH A MEAL. 180 tablet 0  . metoprolol succinate (TOPROL-XL) 25 MG 24 hr tablet TAKE 1 TABLET BY MOUTH EVERY DAY 90 tablet 0  . NON FORMULARY Diet Type: NCS      . pravastatin (PRAVACHOL) 40 MG tablet TAKE 1 TABLET BY MOUTH EVERY DAY 90 tablet 0  . tamsulosin (FLOMAX) 0.4 MG CAPS capsule TAKE 2 CAPSULES BY MOUTH EVERY DAY 60 capsule 3  . traZODone (DESYREL) 50 MG tablet TAKE 1 TABLET BY MOUTH EVERY DAY AT BEDTIME AS NEEDED FOR SLEEP 90 tablet 0   No current facility-administered medications on file prior to visit.    Social History   Tobacco Use  . Smoking status: Never Smoker  . Smokeless tobacco: Never Used  . Tobacco comment: tobacco use - no  Vaping Use  . Vaping Use: Never used  Substance Use Topics  . Alcohol use: Yes    Alcohol/week: 0.0 standard drinks    Comment: rare  . Drug use: No    Review of Systems  Constitutional: Negative for chills and fever.  Eyes: Negative for visual disturbance.  Respiratory: Negative for cough.   Cardiovascular: Negative for chest pain and palpitations.  Gastrointestinal: Negative for nausea and vomiting.  Neurological: Positive for tremors. Negative for dizziness, weakness and headaches.      Objective:    BP 120/68   Pulse 70   Temp 98.2 F (36.8 C)   Ht 6' (1.829 m)   Wt 216 lb 3.2 oz (98.1 kg)   SpO2 96%   BMI 29.32 kg/m  BP Readings from Last 3 Encounters:  09/21/20 120/68  05/28/20 (!) 143/91  08/19/19 107/64   Wt Readings from Last 3 Encounters:  09/21/20 216 lb 3.2 oz (98.1 kg)  05/28/20 218 lb (98.9 kg)  08/15/19 224 lb 13.9 oz (102 kg)    Physical Exam Vitals reviewed.  Constitutional:      Appearance: He is well-developed.  HENT:     Right Ear: Hearing normal.     Left Ear: Hearing normal.     Mouth/Throat:     Pharynx: Uvula midline. No posterior oropharyngeal erythema.  Eyes:     General: Lids are normal. Lids are everted, no foreign bodies appreciated.     Conjunctiva/sclera: Conjunctivae normal.     Pupils: Pupils are equal, round, and reactive to light.     Comments: Normal fundus bilaterally.  Cardiovascular:     Rate and Rhythm: Regular rhythm.      Heart sounds: Normal heart sounds.  Pulmonary:     Effort: Pulmonary effort is normal. No respiratory distress.     Breath sounds: Normal breath sounds. No wheezing, rhonchi or rales.  Lymphadenopathy:     Head:     Right side of head: No submental, submandibular, tonsillar, preauricular,  posterior auricular or occipital adenopathy.     Left side of head: No submental, submandibular, tonsillar, preauricular, posterior auricular or occipital adenopathy.     Cervical: No cervical adenopathy.  Skin:    General: Skin is warm and dry.  Neurological:     Mental Status: He is alert.     Cranial Nerves: No cranial nerve deficit.     Sensory: No sensory deficit.     Deep Tendon Reflexes:     Reflex Scores:      Bicep reflexes are 2+ on the right side and 2+ on the left side.      Patellar reflexes are 2+ on the right side and 2+ on the left side.    Comments: Grip equal and strong bilateral upper extremities. Gait strong and steady. No shuffling gait. No cogwheeling. Fine right hand tremor noted when right arm out stretched. Tremor resolved with finger to nose.   Psychiatric:        Speech: Speech normal.        Behavior: Behavior normal.        Assessment & Plan:   Problem List Items Addressed This Visit      Cardiovascular and Mediastinum   Hypertension    Stable, continue toprol 25mg  qd, losartan 100mg , hctz 12.5mg , norvasc 10mg  qd.       Relevant Orders   Comprehensive metabolic panel   CBC with Differential/Platelet   Ambulatory referral to Cardiology     Endocrine   Diabetes mellitus type 2, controlled ([place])    Anticipate controlled, pending a1c. Continue metformin 500mg  bid      Relevant Orders   CBC with Differential/Platelet   Hemoglobin A1c   Lipid panel   Microalbumin / creatinine urine ratio   TSH     Other   Depression, recurrent ([facility])    Stable. Continue trazodone 50mg . cymbalta 60mg       Encounter for long-term (current) use of aspirin    In primary  prevention of CVD , advised against asa use particularly in age > 60yrs due to bleeding risk. Patient declines stopping at this time and verbalized understanding of risk of lethal hemorrhagic stroke, GIB. He will plan to discuss with cardiology as well as referral placed today for overall risk stratification.       HLD (hyperlipidemia)    Presume stable, pending lipid panel. Continue pravachol 40mg  qd      Tremor - Primary    Fine resting tremor. Worsened with increase of cymbalta. Advised to move to another agent such as prozac which he has been on in the past as tremor is not a listed adverse effect. He declines at this time and would like to trial decreasing dose after the winter when his depressive symptoms are more bothersome.       Relevant Orders   TSH    Other Visit Diagnoses    Need for immunization against influenza       Relevant Orders   Flu Vaccine QUAD High Dose(Fluad) (Completed)       I am having Larry Roman. maintain his aspirin EC, loratadine, Vitamin D3, B-12, Accu-Chek Softclix Lancets, NON FORMULARY, glucose blood, cimetidine, docusate sodium, acetaminophen, pravastatin, traZODone, tamsulosin, amLODipine, losartan, DULoxetine, hydrochlorothiazide, metoprolol succinate, and metFORMIN.   No orders of the defined types were placed in this encounter.   Return precautions given.   Risks, benefits, and alternatives of the medications and treatment plan prescribed today were discussed, and patient expressed understanding.  Education regarding symptom management and diagnosis given to patient on AVS.  Continue to follow with Burnard Hawthorne, FNP for routine health maintenance.   [place] agreed with plan.   Mable Paris, FNP

## 2020-09-21 NOTE — Assessment & Plan Note (Signed)
Presume stable, pending lipid panel. Continue pravachol 40mg  qd

## 2020-09-22 ENCOUNTER — Other Ambulatory Visit: Payer: Self-pay | Admitting: Family

## 2020-09-22 DIAGNOSIS — F339 Major depressive disorder, recurrent, unspecified: Secondary | ICD-10-CM

## 2020-11-15 ENCOUNTER — Other Ambulatory Visit: Payer: Self-pay | Admitting: Urology

## 2020-11-15 DIAGNOSIS — N400 Enlarged prostate without lower urinary tract symptoms: Secondary | ICD-10-CM

## 2020-12-03 ENCOUNTER — Other Ambulatory Visit: Payer: Self-pay | Admitting: Family

## 2020-12-03 DIAGNOSIS — I10 Essential (primary) hypertension: Secondary | ICD-10-CM

## 2020-12-11 IMAGING — DX DG KNEE 1-2V PORT*L*
2 series · 2 of 2 positions shown · non-contrast
Comparison: None.

CLINICAL DATA: Post total left knee replacement.

EXAM:
PORTABLE LEFT KNEE - 1-2 VIEW

[knee ap]
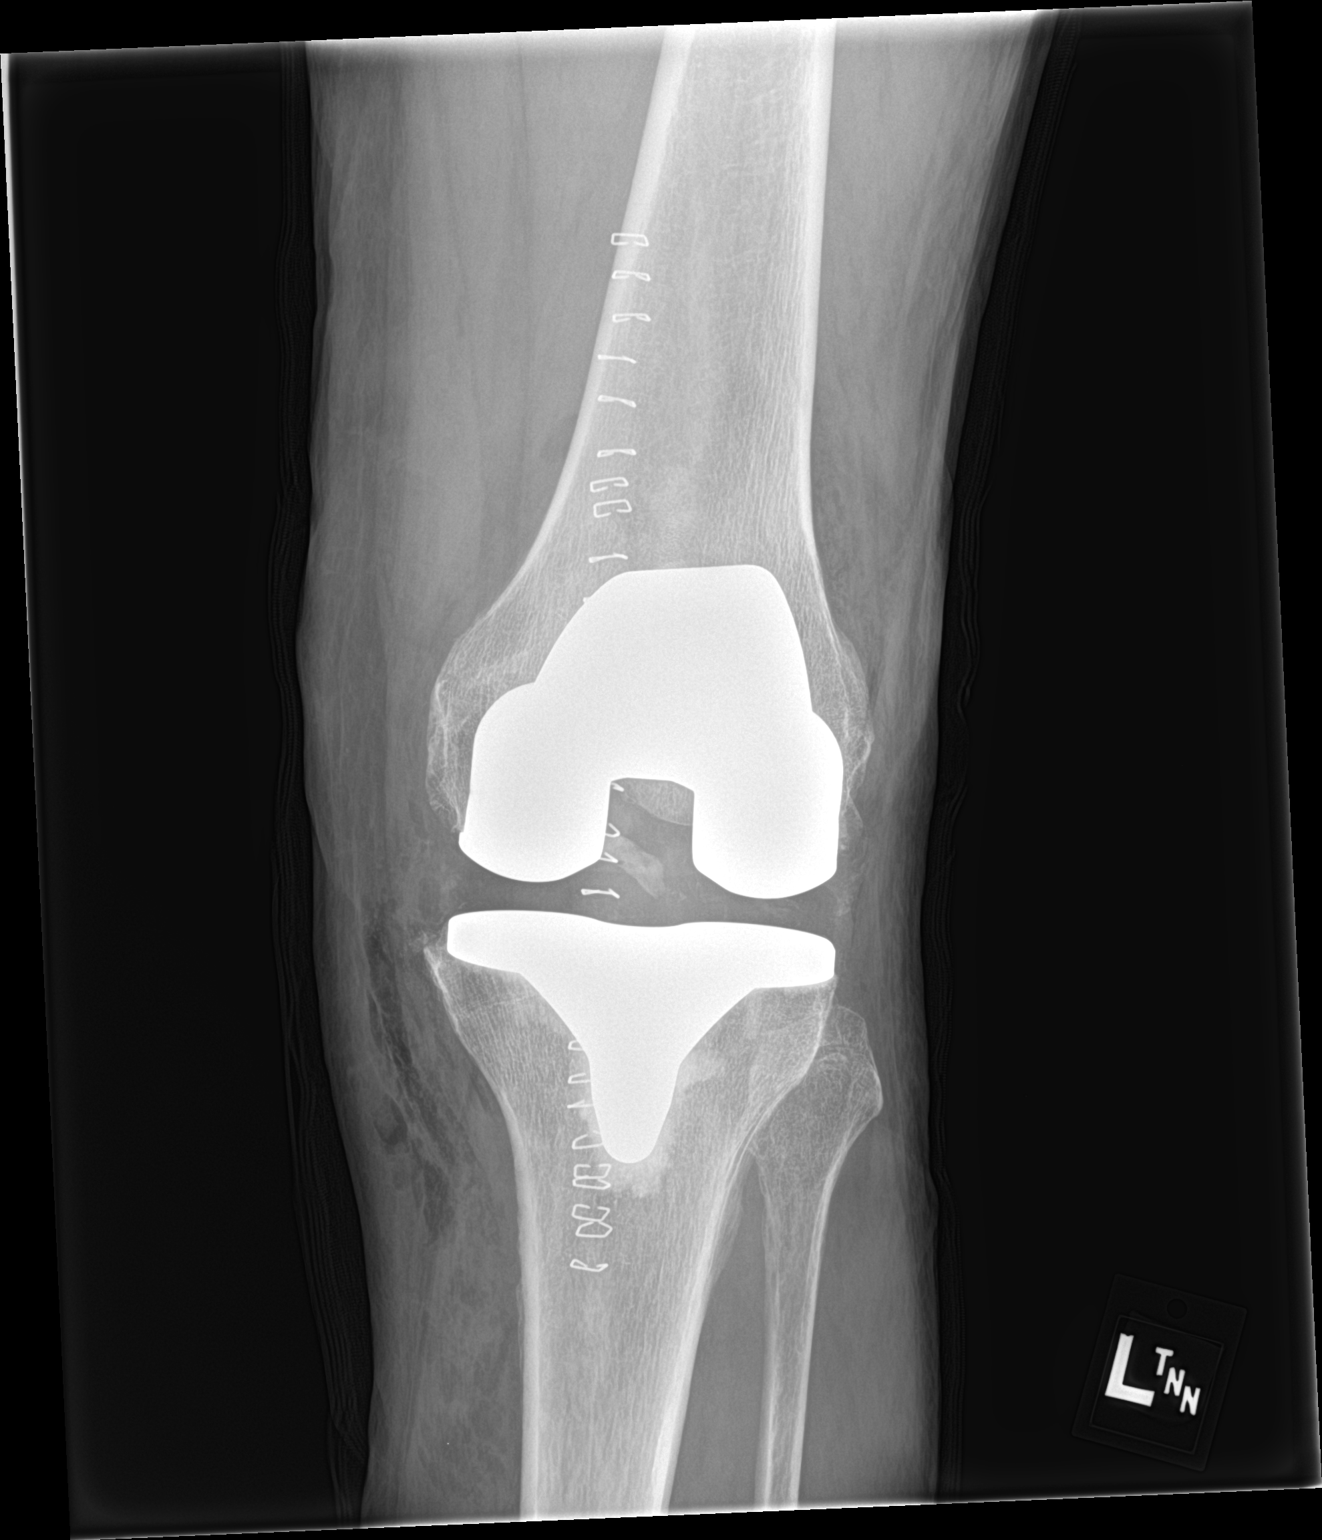

[knee lat]
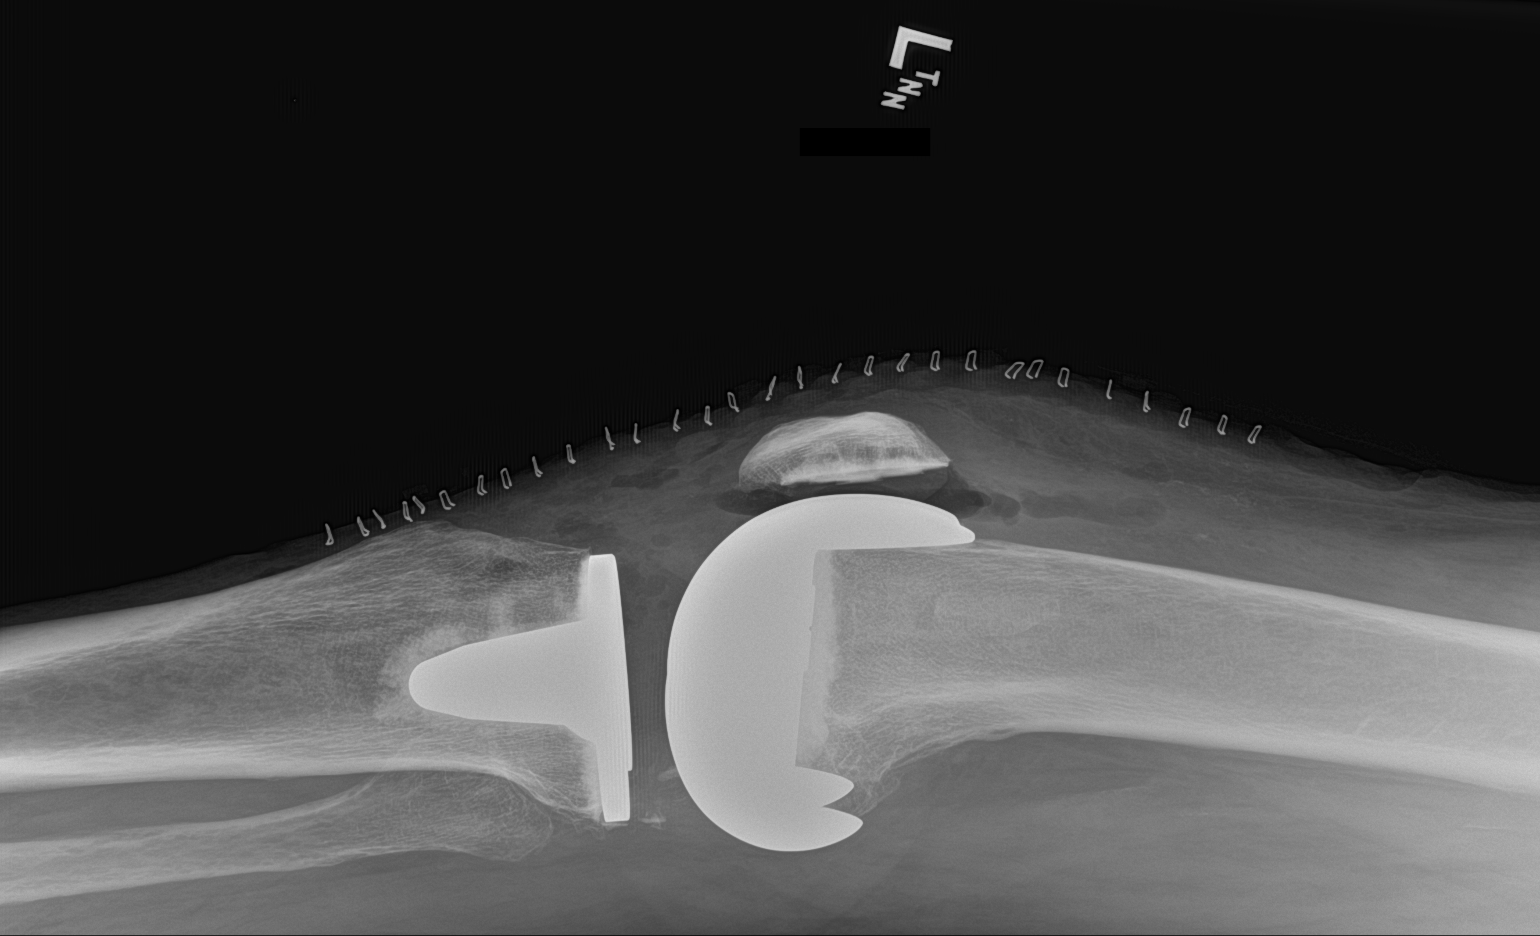

[2 of 2 positions shown; findings below may reference images not displayed]

FINDINGS: Examination demonstrates expected changes post left total knee
arthroplasty. Prosthetic components normally located and intact.
Skin staples over the anterior soft tissues.
IMPRESSION: Expected changes post left total knee arthroplasty.

## 2020-12-14 ENCOUNTER — Other Ambulatory Visit: Payer: Self-pay | Admitting: Family

## 2020-12-14 DIAGNOSIS — F339 Major depressive disorder, recurrent, unspecified: Secondary | ICD-10-CM

## 2020-12-21 ENCOUNTER — Telehealth (INDEPENDENT_AMBULATORY_CARE_PROVIDER_SITE_OTHER): Payer: PPO | Admitting: Family

## 2020-12-21 VITALS — Ht 72.0 in

## 2020-12-21 DIAGNOSIS — R251 Tremor, unspecified: Secondary | ICD-10-CM

## 2020-12-21 DIAGNOSIS — I10 Essential (primary) hypertension: Secondary | ICD-10-CM

## 2020-12-21 DIAGNOSIS — F339 Major depressive disorder, recurrent, unspecified: Secondary | ICD-10-CM

## 2020-12-21 DIAGNOSIS — R0981 Nasal congestion: Secondary | ICD-10-CM | POA: Diagnosis not present

## 2020-12-21 MED ORDER — AMOXICILLIN-POT CLAVULANATE 875-125 MG PO TABS: 1.0000 | ORAL_TABLET | Freq: Two times a day (BID) | ORAL | 0 refills | Status: AC

## 2020-12-21 MED ORDER — BENZONATATE 100 MG PO CAPS: 100.0000 mg | ORAL_CAPSULE | Freq: Three times a day (TID) | ORAL | 1 refills | Status: DC | PRN

## 2020-12-21 NOTE — Assessment & Plan Note (Addendum)
Afebrile. No acute respiratory distress. Duration 3 weeks and suspect secondary bacterial infection. Start augmentin. Advised probiotics. Advised to remain quarantined until symptoms resolved as unsure if covid and weather unsafe to schedule testing today.

## 2020-12-21 NOTE — Assessment & Plan Note (Signed)
Overall unchanged. Discussed change from cymbalta to prozac in the springtime. Declines further evaluation including neurology consult at this time.

## 2020-12-21 NOTE — Progress Notes (Signed)
Virtual Visit via Video Note  I connected with@  on 12/21/20 at  9:00 AM EST by a video enabled telemedicine application and verified that I am speaking with the correct person using two identifiers.  Location patient: home Location provider:work  Persons participating in the virtual visit: patient, provider  I discussed the limitations of evaluation and management by telemedicine and the availability of in person appointments. The patient expressed understanding and agreed to proceed.   HPI: Complains of congestion and productive cough x 3 weeks, some improvement.  Continues to have coughing spells, more so in the morning and somewhat throughout the day.  No facial pain, ear pain, sore throat,sob, wheezing,rattle in chest.   Had low grade fever tmax 99 since resolved.   Has been taking cloricidin and mucinex with some relief.   Pfizer vaccines completed. No known covid contacts. No recent antibiotics.    Right hand tremor noted to be worse when increased to 60mg  cymbalta however declines decreasing cymbalta due to winter season.  NO vision changes, HA, falls, changes in gait nor memory changes. Can write, hold his cup of coffee. Notes tremor more present when holding a glass of water and moves glass closer to his mouth to sip.  HTN- compliant with hctz, losartan, toprol. Hasnt checked blood pressure lately. No cp.   DM- compliant with metformin 500mg  bid  Depression- compliant with cymbalta, trazodone. Feels well on regimen.   Due for colonoscopy; declines for now.    ROS: See pertinent positives and negatives per HPI.    EXAM:  VITALS per patient if applicable: Ht 6' (4.696 m)   BMI 29.32 kg/m  BP Readings from Last 3 Encounters:  09/21/20 120/68  05/28/20 (!) 143/91  08/19/19 107/64   Wt Readings from Last 3 Encounters:  09/21/20 216 lb 3.2 oz (98.1 kg)  05/28/20 218 lb (98.9 kg)  08/15/19 224 lb 13.9 oz (102 kg)    GENERAL: alert, oriented, appears well  and in no acute distress  HEENT: atraumatic, conjunttiva clear, no obvious abnormalities on inspection of external nose and ears  NECK: normal movements of the head and neck  LUNGS: on inspection no signs of respiratory distress, breathing rate appears normal, no obvious gross SOB, gasping or wheezing  CV: no obvious cyanosis  MS: moves all visible extremities without noticeable abnormality  PSYCH/NEURO: pleasant and cooperative, no obvious depression or anxiety, speech and thought processing grossly intact  ASSESSMENT AND PLAN:  Discussed the following assessment and plan:  Problem List Items Addressed This Visit      Cardiovascular and Mediastinum   Hypertension    Stable. Continue toprol 25mg  qd, losartan 100mg , hctz 12.5mg , norvasc 10mg  qd        Respiratory   Sinus congestion - Primary    Afebrile. No acute respiratory distress. Duration 3 weeks and suspect secondary bacterial infection. Start augmentin. Advised probiotics. Advised to remain quarantined until symptoms resolved as unsure if covid and weather unsafe to schedule testing today.       Relevant Medications   amoxicillin-clavulanate (AUGMENTIN) 875-125 MG tablet   benzonatate (TESSALON PERLES) 100 MG capsule     Other   Depression, recurrent ([place])    Controlled. Continue trazdone 50mg  and cymbalta 60mg . Will discuss changing away from cymabalta at follow up due to concerns for tremor.       Tremor    Overall unchanged. Discussed change from cymbalta to prozac in the springtime. Declines further evaluation including neurology consult at this time.          -  we discussed possible serious and likely etiologies, options for evaluation and workup, limitations of telemedicine visit vs in person visit, treatment, treatment risks and precautions. Pt prefers to treat via telemedicine empirically rather then risking or undertaking an in person visit at this moment.  .   I discussed the assessment and treatment  plan with the patient. The patient was provided an opportunity to ask questions and all were answered. The patient agreed with the plan and demonstrated an understanding of the instructions.   The patient was advised to call back or seek an in-person evaluation if the symptoms worsen or if the condition fails to improve as anticipated.   Mable Paris, FNP

## 2020-12-21 NOTE — Assessment & Plan Note (Signed)
Controlled. Continue trazdone 50mg  and cymbalta 60mg . Will discuss changing away from cymabalta at follow up due to concerns for tremor.

## 2020-12-21 NOTE — Assessment & Plan Note (Signed)
Stable. Continue toprol 25mg  qd, losartan 100mg , hctz 12.5mg , norvasc 10mg  qd

## 2020-12-28 ENCOUNTER — Other Ambulatory Visit: Payer: Self-pay | Admitting: Family

## 2020-12-28 DIAGNOSIS — E785 Hyperlipidemia, unspecified: Secondary | ICD-10-CM

## 2020-12-28 NOTE — Progress Notes (Signed)
Cardiology Office Note  Date:  12/29/2020   ID:  Larry Roman., DOB December 03, 1946, MRN [id number]  PCP:  Burnard Hawthorne, FNP   Chief Complaint  Patient presents with  . New Patient (Initial Visit)    Re-establish care with provider for family hx of heart issues and discuss the use of Aspirin. Medications verbally reviewed with patient.     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 Concerned about stopping the aspirin On pravastatin, takes it every other day  Lab work reviewed Hemoglobin A1c 6.5 Total cholesterol 132 LDL 50  Images from January 2011 CT scan abdomen pelvis pulled up This actually scan through the heart, no significant coronary calcification, no significant aortic atherosclerosis noted  EKG personally reviewed by myself on todays visit Shows normal sinus rhythm rate 66 bpm right bundle branch block   PMH:   has a past medical history of Depression, DM (diabetes mellitus) ([place]), Extrinsic asthma, unspecified, Hives of unknown origin, HTN (hypertension), Other allergy, other than to medicinal agents, RBBB, Sleep apnea, and Wound of right leg (08/2018).  PSH:    Past Surgical History:  Procedure Laterality Date  . BACK SURGERY  1995   ruptured disc encapsulated nerves  . CATARACT EXTRACTION, BILATERAL  2016   Dr. Kerman Passey at [facility]  . EYE SURGERY Bilateral    cataract extractions   . JOINT REPLACEMENT Right    total knee  . TONSILLECTOMY    . TONSILLECTOMY    . TOTAL KNEE ARTHROPLASTY Right 09/27/2018   Procedure: TOTAL KNEE ARTHROPLASTY;  Surgeon: Thornton Park, MD;  Location: [facility] ORS;  Service: Orthopedics;  Laterality: Right;  . TOTAL KNEE ARTHROPLASTY Left 08/15/2019   Procedure: TOTAL KNEE ARTHROPLASTY;  Surgeon: Thornton Park, MD;  Location: [facility] ORS;  Service: Orthopedics;  Laterality: Left;    Current Outpatient Medications  Medication Sig Dispense Refill  . ACCU-CHEK SOFTCLIX LANCETS lancets Use up to 4 times daily to check blood sugar. Diagnosis E11.9 100 each 12  . acetaminophen (TYLENOL) 500 MG tablet Take 1,500 mg by mouth 2 (two) times daily as needed for moderate pain.    Marland Kitchen amLODipine (NORVASC) 10 MG tablet TAKE 1 TABLET (10 MG TOTAL) BY MOUTH DAILY WITH LUNCH. 90 tablet 1  . azelastine (ASTELIN) 0.1 % nasal spray Place 1 spray into both nostrils 2 (two) times daily. Use in each nostril as directed 30 mL 4  . Cholecalciferol (VITAMIN D3) 2000 UNITS TABS Take 2,000 Units by mouth daily.     . cimetidine (TAGAMET) 200 MG tablet Take 200 mg by mouth every Monday, Wednesday, and Friday.     . Cyanocobalamin (B-12) 5000 MCG SUBL Place 5,000 mcg under the tongue daily.     Marland Kitchen docusate sodium (COLACE) 50 MG capsule Take 50 mg by mouth daily.    . DULoxetine (CYMBALTA) 30 MG capsule TAKE 1 CAPSULE (30 MG TOTAL) BY MOUTH 2 (  TWO) TIMES DAILY. 180 capsule 2  . glucose blood (ONETOUCH VERIO) test strip TEST 3 TIMES A DAY 100 each 0  . hydrochlorothiazide (MICROZIDE) 12.5 MG capsule TAKE 1 CAPSULE BY MOUTH EVERY DAY 90 capsule 0  . loratadine (CLARITIN) 10 MG tablet Take 10 mg by mouth daily.    Marland Kitchen losartan (COZAAR) 100 MG tablet TAKE 1 TABLET BY MOUTH EVERY DAY 90 tablet 1  . metFORMIN (GLUCOPHAGE) 500 MG tablet TAKE 1 TABLET (500 MG TOTAL) BY MOUTH 2 (TWO) TIMES DAILY WITH A MEAL. 180 tablet 0  . metoprolol succinate (TOPROL-XL) 25 MG 24 hr tablet TAKE 1  TABLET BY MOUTH EVERY DAY 90 tablet 0  . NON FORMULARY Diet Type: NCS    . pravastatin (PRAVACHOL) 40 MG tablet Take 40 mg by mouth every Monday, Wednesday, and Friday.    . tamsulosin (FLOMAX) 0.4 MG CAPS capsule TAKE 2 CAPSULES BY MOUTH EVERY DAY 180 capsule 1  . traZODone (DESYREL) 50 MG tablet TAKE 1 TABLET BY MOUTH EVERY DAY AT BEDTIME AS NEEDED FOR SLEEP 90 tablet 0   No current facility-administered medications for this visit.    Allergies:   Ibuprofen   Social History:  The patient  reports that he has never smoked. He has never used smokeless tobacco. He reports current alcohol use. He reports that he does not use drugs.   Family History:   family history includes Heart attack in his mother; Heart failure in his father.    Review of Systems: Review of Systems  Constitutional: Negative.   HENT: Negative.   Respiratory: Negative.   Cardiovascular: Negative.   Gastrointestinal: Negative.   Musculoskeletal: Negative.   Neurological: Negative.  Loss of consciousness: DM IIwell controlled.obese.  Psychiatric/Behavioral: Negative.   All other systems reviewed and are negative.   PHYSICAL EXAM: VS:  BP 136/70 (BP Location: Left Arm, Patient Position: Sitting, Cuff Size: Normal)   Pulse 66   Ht 6' (1.829 m)   Wt 218 lb (98.9 kg)   SpO2 95%   BMI 29.57 kg/m  , BMI Body mass index is 29.57 kg/m. GEN: Well nourished, well developed, in no acute distress HEENT: normal Neck: no JVD, carotid bruits, or masses Cardiac: RRR; no murmurs, rubs, or gallops,no edema  Respiratory:  clear to auscultation bilaterally, normal work of breathing GI: soft, nontender, nondistended, + BS MS: no deformity or atrophy Skin: warm and dry, no rash Neuro:  Strength and sensation are intact Psych: euthymic mood, full affect   Recent Labs: 09/21/2020: ALT 26; BUN 16; Creatinine, Ser 0.82; Hemoglobin 13.6; Platelets 275.0; Potassium 4.2; Sodium 136; TSH 1.95    Lipid Panel Lab Results   Component Value Date   CHOL 132 09/21/2020   HDL 47.10 09/21/2020   LDLCALC 50 09/21/2020   TRIG 171.0 (H) 09/21/2020      Wt Readings from Last 3 Encounters:  12/29/20 218 lb (98.9 kg)  09/21/20 216 lb 3.2 oz (98.1 kg)  05/28/20 218 lb (98.9 kg)      ASSESSMENT AND PLAN:  Problem List Items Addressed This Visit      Cardiology Problems   Hypertension   Relevant Medications   pravastatin (PRAVACHOL) 40 MG tablet   Other Relevant Orders   EKG 12-Lead   HLD (hyperlipidemia)   Relevant Medications   pravastatin (PRAVACHOL) 40 MG tablet   Right bundle branch block   Relevant Medications   pravastatin (PRAVACHOL) 40 MG tablet     Other   Diabetes mellitus  type 2, controlled ([place]) - Primary   Relevant Medications   pravastatin (PRAVACHOL) 40 MG tablet   Other Relevant Orders   EKG 12-Lead    Other Visit Diagnoses    Abnormal EKG       Relevant Orders   EKG 12-Lead     DM II We have encouraged continued exercise, careful diet management in an effort to lose weight. A1c 6.5, Recommended walking program  Hyperlipidemia Cholesterol is at goal on the current lipid regimen. No changes to the medications were made.  HTN Blood pressure is well controlled on today's visit. No changes made to the medications.  RBBB: ABn ekg, no change from 2013 No further workup needed Asymptomatic  Preventive care CT scan 10 yrs ago with no coronary calcium, Images pulled up and reviewed No aortic atherosclerosis Probably does not need aspirin  He thinks he might take it twice a week, makes him feel better    Total encounter time more than 60 minutes  Greater than 50% was spent in counseling and coordination of care with the patient    Signed, Esmond Plants, M.D., Ph.D. [place], [place]

## 2020-12-29 ENCOUNTER — Encounter: Payer: Self-pay | Admitting: Cardiovascular Disease

## 2020-12-29 ENCOUNTER — Ambulatory Visit: Payer: PPO | Admitting: Cardiovascular Disease

## 2020-12-29 ENCOUNTER — Other Ambulatory Visit: Payer: Self-pay

## 2020-12-29 VITALS — BP 136/70 | HR 66 | Ht 72.0 in | Wt 218.0 lb

## 2020-12-29 DIAGNOSIS — I4510 Unspecified right bundle-branch block: Secondary | ICD-10-CM

## 2020-12-29 DIAGNOSIS — E119 Type 2 diabetes mellitus without complications: Secondary | ICD-10-CM | POA: Diagnosis not present

## 2020-12-29 DIAGNOSIS — I10 Essential (primary) hypertension: Secondary | ICD-10-CM | POA: Diagnosis not present

## 2020-12-29 DIAGNOSIS — R9431 Abnormal electrocardiogram [ECG] [EKG]: Secondary | ICD-10-CM

## 2020-12-29 DIAGNOSIS — E782 Mixed hyperlipidemia: Secondary | ICD-10-CM

## 2020-12-29 NOTE — Patient Instructions (Signed)
Medication Instructions:  No changes  If you need a refill on your cardiac medications before your next appointment, please call your pharmacy.    Lab work: No new labs needed   If you have labs (blood work) drawn today and your tests are completely normal, you will receive your results only by: . MyChart Message (if you have MyChart) OR . A paper copy in the mail If you have any lab test that is abnormal or we need to change your treatment, we will call you to review the results.   Testing/Procedures: No new testing needed   Follow-Up: At [facility], you and your health needs are our priority.  As part of our continuing mission to provide you with exceptional heart care, we have created designated Provider Care Teams.  These Care Teams include your primary Cardiologist (physician) and Advanced Practice Providers (APPs -  Physician Assistants and Nurse Practitioners) who all work together to provide you with the care you need, when you need it.  . You will need a follow up appointment as needed  . Providers on your designated Care Team:   . Christopher Berge, NP . Ryan Dunn, PA-C . Jacquelyn Visser, PA-C  Any Other Special Instructions Will Be Listed Below (If Applicable).  COVID-19 Vaccine Information can be found at: [email or web address] For questions related to vaccine distribution or appointments, please email [email or web address] or call [phone].     

## 2021-01-11 ENCOUNTER — Ambulatory Visit (INDEPENDENT_AMBULATORY_CARE_PROVIDER_SITE_OTHER): Payer: PPO

## 2021-01-11 VITALS — Ht 72.0 in | Wt 218.0 lb

## 2021-01-11 DIAGNOSIS — Z0000 Encounter for general adult medical examination without abnormal findings: Secondary | ICD-10-CM

## 2021-01-11 NOTE — Progress Notes (Addendum)
Subjective:   Staci RighterManly J Argueta Jr. is a 75 y.o. male who presents for Medicare Annual/Subsequent preventive examination.  Review of Systems    No ROS.  Medicare Wellness Virtual Visit.    Cardiac Risk Factors include: advanced age (>4355men, 61>65 women);hypertension;diabetes mellitus;male gender     Objective:    Today's Vitals   01/11/21 1102  Weight: 218 lb (98.9 kg)  Height: 6' (1.829 m)   Body mass index is 29.57 kg/m.  Advanced Directives 01/11/2021 08/15/2019 08/15/2019 08/08/2019 10/15/2018 10/11/2018 10/02/2018  Does Patient Have a Medical Advance Directive? Yes Yes Yes Yes No No Yes  Type of Estate agentAdvance Directive Healthcare Power of OrchardsAttorney;Living will Healthcare Power of [place]Attorney;Living will Healthcare Power of [place]orney;Living will Healthcare Power of [place]orney;Living will - - Healthcare Power of Attorney  Does patient want to make changes to medical advance directive? No - Patient declined No - Patient declined No - Patient declined - No - Patient declined No - Patient declined Yes (MAU/Ambulatory/Procedural Areas - Information given)  Copy of Healthcare Power of Attorney in Chart? No - copy requested No - copy requested No - copy requested No - copy requested - - No - copy requested  Would patient like information on creating a medical advance directive? - - - - No - Patient declined No - Patient declined -    Current Medications (verified) Outpatient Encounter Medications as of 01/11/2021  Medication Sig  . ACCU-CHEK SOFTCLIX LANCETS lancets Use up to 4 times daily to check blood sugar. Diagnosis E11.9  . acetaminophen (TYLENOL) 500 MG tablet Take 1,500 mg by mouth 2 (two) times daily as needed for moderate pain.  Marland Kitchen. amLODipine (NORVASC) 10 MG tablet TAKE 1 TABLET (10 MG TOTAL) BY MOUTH DAILY WITH LUNCH.  Marland Kitchen. azelastine (ASTELIN) 0.1 % nasal spray Place 1 spray into both nostrils 2 (two) times daily. Use in each nostril as directed  . Cholecalciferol (VITAMIN D3) 2000 UNITS TABS Take  2,000 Units by mouth daily.   . cimetidine (TAGAMET) 200 MG tablet Take 200 mg by mouth every Monday, Wednesday, and Friday.   . Cyanocobalamin (B-12) 5000 MCG SUBL Place 5,000 mcg under the tongue daily.   Marland Kitchen. docusate sodium (COLACE) 50 MG capsule Take 50 mg by mouth daily.  . DULoxetine (CYMBALTA) 30 MG capsule TAKE 1 CAPSULE (30 MG TOTAL) BY MOUTH 2 (TWO) TIMES DAILY.  Marland Kitchen. glucose blood (ONETOUCH VERIO) test strip TEST 3 TIMES A DAY  . hydrochlorothiazide (MICROZIDE) 12.5 MG capsule TAKE 1 CAPSULE BY MOUTH EVERY DAY  . loratadine (CLARITIN) 10 MG tablet Take 10 mg by mouth daily.  Marland Kitchen. losartan (COZAAR) 100 MG tablet TAKE 1 TABLET BY MOUTH EVERY DAY  . metFORMIN (GLUCOPHAGE) 500 MG tablet TAKE 1 TABLET (500 MG TOTAL) BY MOUTH 2 (TWO) TIMES DAILY WITH A MEAL.  . metoprolol succinate (TOPROL-XL) 25 MG 24 hr tablet TAKE 1 TABLET BY MOUTH EVERY DAY  . NON FORMULARY Diet Type: NCS  . pravastatin (PRAVACHOL) 40 MG tablet Take 40 mg by mouth every Monday, Wednesday, and Friday.  . tamsulosin (FLOMAX) 0.4 MG CAPS capsule TAKE 2 CAPSULES BY MOUTH EVERY DAY  . traZODone (DESYREL) 50 MG tablet TAKE 1 TABLET BY MOUTH EVERY DAY AT BEDTIME AS NEEDED FOR SLEEP   No facility-administered encounter medications on file as of 01/11/2021.    Allergies (verified) Ibuprofen   History: Past Medical History:  Diagnosis Date  . Depression   . DM (diabetes mellitus) ([facility])   . Extrinsic  asthma, unspecified    childhood  . Hives of unknown origin   . HTN (hypertension)   . Other allergy, other than to medicinal agents   . RBBB   . Sleep apnea    25 years ago mild lost weight no longer uses cpap  . Wound of right leg 08/2018   area just below knee size of quarter, reddened around edges   Past Surgical History:  Procedure Laterality Date  . BACK SURGERY  1995   ruptured disc encapsulated nerves  . CATARACT EXTRACTION, BILATERAL  2016   Dr. Kerman Passey at [facility]  . EYE SURGERY Bilateral     cataract extractions  . JOINT REPLACEMENT Right    total knee  . TONSILLECTOMY    . TONSILLECTOMY    . TOTAL KNEE ARTHROPLASTY Right 09/27/2018   Procedure: TOTAL KNEE ARTHROPLASTY;  Surgeon: Thornton Park, MD;  Location: [facility] ORS;  Service: Orthopedics;  Laterality: Right;  . TOTAL KNEE ARTHROPLASTY Left 08/15/2019   Procedure: TOTAL KNEE ARTHROPLASTY;  Surgeon: Thornton Park, MD;  Location: [facility] ORS;  Service: Orthopedics;  Laterality: Left;   Family History  Problem Relation Age of Onset  . Heart failure Father   . Heart attack Mother    Social History   Socioeconomic History  . Marital status: Married    Spouse name: Pam  . Number of children: 2  . Years of education: Not on file  . Highest education level: Not on file  Occupational History    Employer: [employer]    Comment: retired  Tobacco Use  . Smoking status: Never Smoker  . Smokeless tobacco: Never Used  . Tobacco comment: tobacco use - no  Vaping Use  . Vaping Use: Never used  Substance and Sexual Activity  . Alcohol use: Yes    Alcohol/week: 0.0 standard drinks    Comment: rare  . Drug use: No  . Sexual activity: Yes  Other Topics Concern  . Not on file  Social History Narrative  . Not on file   Social Determinants of Health   Financial Resource Strain: Low Risk   . Difficulty of Paying Living Expenses: Not hard at all  Food Insecurity: No Food Insecurity  . Worried About Charity fundraiser in the Last Year: Never true  . Ran Out of Food in the Last Year: Never true  Transportation Needs: No Transportation Needs  . Lack of Transportation (Medical): No  . Lack of Transportation (Non-Medical): No  Physical Activity: Insufficiently Active  . Days of Exercise per Week: 3 days  . Minutes of Exercise per Session: 30 min  Stress: No Stress Concern Present  . Feeling of Stress : Not at all  Social Connections: Not on file    Tobacco Counseling Counseling given: Not Answered Comment:  tobacco use - no   Clinical Intake:  Pre-visit preparation completed: Yes        Diabetes: No     Nutrition Risk Assessment: Has the patient had any N/V/D within the last 2 weeks?  No  Does the patient have any non-healing wounds?  No  Has the patient had any unintentional weight loss or weight gain?  No   Diabetes: If diabetic, was a CBG obtained today?  No  Did the patient bring in their glucometer from home?  No  How often do you monitor your CBG's? About twice weekly.   Financial Strains and Diabetes Management: Are you having any financial strains with the device,  your supplies or your medication? No .  Does the patient want to be seen by Chronic Care Management for management of their diabetes?  No  Would the patient like to be referred to a Nutritionist or for Diabetic Management?  No   Diabetic Exams: Diabetic Eye Exam: Completed 02/07/20 Diabatic foot exam- followed by pcp. Notes no changes.         Activities of Daily Living In your present state of health, do you have any difficulty performing the following activities: 01/11/2021  Hearing? N  Vision? N  Difficulty concentrating or making decisions? N  Walking or climbing stairs? N  Dressing or bathing? N  Doing errands, shopping? N  Preparing Food and eating ? N  Using the Toilet? N  In the past six months, have you accidently leaked urine? N  Do you have problems with loss of bowel control? N  Managing your Medications? N  Managing your Finances? N  Housekeeping or managing your Housekeeping? N  Some recent data might be hidden    Patient Care Team: Burnard Hawthorne, FNP as PCP - General (Family Medicine)  Indicate any recent Medical Services you may have received from other than [facility] providers in the past year (date may be approximate).     Assessment:   This is a routine wellness examination for [facility].  I connected with Higinio today by telephone and verified that I am speaking with the  correct person using two identifiers. Location patient: home Location provider: work Persons participating in the virtual visit: patient, Marine scientist.    I discussed the limitations, risks, security and privacy concerns of performing an evaluation and management service by telephone and the availability of in person appointments. The patient expressed understanding and verbally consented to this telephonic visit.    Interactive audio and video telecommunications were attempted between this provider and patient, however failed, due to patient having technical difficulties OR patient did not have access to video capability.  We continued and completed visit with audio only.  Some vital signs may be absent or patient reported.   Hearing/Vision screen  Hearing Screening   125Hz  250Hz  500Hz  1000Hz  2000Hz  3000Hz  4000Hz  6000Hz  8000Hz   Right ear:           Left ear:           Comments: Patient is able to hear conversational tones without difficulty.  No issues reported.  Vision Screening Comments: Follow[facility]  Wears corrective lenses when reading  Annual visits Cataract extraction, bilateral  Visual acuity not assessed per patient preference since they have regular follow up with the ophthalmologist.  Virtual visit.   Dietary issues and exercise activities discussed: Current Exercise Habits: Home exercise routine, Type of exercise: calisthenics (Stationary bike), Time (Minutes): 30, Frequency (Times/Week): 3, Weekly Exercise (Minutes/Week): 90, Intensity: Mild  Healthy diet  Good water intake  Goals      Patient Stated   .  I want to keep my blood sugars and blood pressure down (pt-stated)      Other   .  increase lean proteins      Reduce fast food intake  Low carb foods      Depression Screen PHQ 2/9 Scores 01/11/2021 12/21/2020 08/07/2019 12/17/2018 04/25/2018 04/24/2017 05/19/2016  PHQ - 2 Score 0 0 1 0 0 0 0  PHQ- 9 Score - - 2 2 - 0 -    Fall Risk Fall Risk  01/11/2021  09/21/2020 06/30/2020 08/07/2019 04/25/2018  Falls in the past year? 0 0 0 0 Yes  Comment - - Emmi Telephone Survey: data to providers prior to load - -  Number falls in past yr: 0 - - - 1  Injury with Fall? 0 - - - Yes  Comment - - - - -  Risk Factor Category  - - - - High Fall Risk  Risk for fall due to : - - - Orthopedic patient History of fall(s);Impaired balance/gait  Risk for fall due to: Comment - - - - He does not always use a cane when ambulating  Follow up Falls evaluation completed Falls evaluation completed - Falls evaluation completed Education provided;Falls prevention discussed    FALL RISK PREVENTION PERTAINING TO THE HOME: Handrails in use when climbing stairs? Yes Home free of loose throw rugs in walkways, pet beds, electrical cords, etc? Yes  Adequate lighting in your home to reduce risk of falls? Yes   ASSISTIVE DEVICES UTILIZED TO PREVENT FALLS: Use of a cane, walker or w/c? No .  TIMED UP AND GO:  Was the test performed? No . Virtual visit.   Cognitive Function: MMSE - Mini Mental State Exam 04/25/2018 04/24/2017 04/22/2016  Orientation to time 5 5 5   Orientation to Place 5 5 5   Registration 3 3 3   Attention/ Calculation 5 5 5   Recall 3 3 3   Language- name 2 objects 2 2 2   Language- repeat 1 1 1   Language- follow 3 step command 3 3 3   Language- read & follow direction 1 1 1   Write a sentence 1 1 1   Copy design 1 1 1   Total score 30 30 30         Immunizations Immunization History  Administered Date(s) Administered  . Fluad Quad(high Dose 65+) 10/30/2019, 09/21/2020  . Influenza Split 11/10/2011, 09/10/2012  . Influenza, High Dose Seasonal PF 09/11/2017, 09/20/2018  . Influenza,inj,Quad PF,6+ Mos 10/02/2013, 08/30/2014, 09/14/2015, 08/25/2016  . PFIZER(Purple Top)SARS-COV-2 Vaccination 02/05/2020, 02/26/2020, 10/25/2020  . Pneumococcal Conjugate-13 01/14/2014  . Pneumococcal Polysaccharide-23 07/16/2012, 06/25/2019  . Tdap 11/10/2011  . Varicella  11/16/2011  . Zoster 12/01/2011   Health Maintenance Health Maintenance  Topic Date Due  . FOOT EXAM  04/17/2019  . COLONOSCOPY (Pts 45-93yrs Insurance coverage will need to be confirmed)  11/15/2020  . OPHTHALMOLOGY EXAM  02/06/2021  . HEMOGLOBIN A1C  03/22/2021  . TETANUS/TDAP  11/09/2021  . INFLUENZA VACCINE  Completed  . COVID-19 Vaccine  Completed  . PNA vac Low Risk Adult  Completed  . Hepatitis C Screening  Discontinued   Colorectal cancer screening: Type of screening: Colonoscopy. Completed 11/15/10. Repeat every 10 years  Lung Cancer Screening: (Low Dose CT Chest recommended if Age 42-80 years, 30 pack-year currently smoking OR have quit w/in 15years.) does not qualify.   Vision Screening: Recommended annual ophthalmology exams for early detection of glaucoma and other disorders of the eye. Is the patient up to date with their annual eye exam?  Yes   Dental Screening: Recommended annual dental exams for proper oral hygiene.  Community Resource Referral / Chronic Care Management: CRR required this visit?  No   CCM required this visit?  No      Plan:   Keep all routine maintenance appointments.   Follow up 02/03/21 @ 11:00  I have personally reviewed and noted the following in the patient's chart:   . Medical and social history . Use of alcohol, tobacco or illicit drugs  . Current medications and supplements . Functional  ability and status . Nutritional status . Physical activity . Advanced directives . List of other physicians . Hospitalizations, surgeries, and ER visits in previous 12 months . Vitals . Screenings to include cognitive, depression, and falls . Referrals and appointments  In addition, I have reviewed and discussed with patient certain preventive protocols, quality metrics, and best practice recommendations. A written personalized care plan for preventive services as well as general preventive health recommendations were provided to patient via  mychart.     Varney Biles, LPN   [id number]     Agree with plan. Mable Paris, NP

## 2021-01-11 NOTE — Patient Instructions (Addendum)
[name] , Thank you for taking time to come for your Medicare Wellness Visit. I appreciate your ongoing commitment to your health goals. Please review the following plan we discussed and let me know if I can assist you in the future.   These are the goals we discussed: Goals      Patient Stated   .  I want to keep my blood sugars and blood pressure down (pt-stated)      Other   .  increase lean proteins      Reduce fast food intake  Low carb foods       This is a list of the screening recommended for you and due dates:  Health Maintenance  Topic Date Due  . Complete foot exam   04/17/2019  . Colon Cancer Screening  11/15/2020  . Eye exam for diabetics  02/06/2021  . Hemoglobin A1C  03/22/2021  . Tetanus Vaccine  11/09/2021  . Flu Shot  Completed  . COVID-19 Vaccine  Completed  . Pneumonia vaccines  Completed  .  Hepatitis C: One time screening is recommended by Center for Disease Control  (CDC) for  adults born from [birth year] through 1964.   Discontinued    Immunizations Immunization History  Administered Date(s) Administered  . Fluad Quad(high Dose 74+) 10/30/2019, 09/21/2020  . Influenza Split 11/10/2011, 09/10/2012  . Influenza, High Dose Seasonal PF 09/11/2017, 09/20/2018  . Influenza,inj,Quad PF,6+ Mos 10/02/2013, 08/30/2014, 09/14/2015, 08/25/2016  . PFIZER(Purple Top)SARS-COV-2 Vaccination 02/05/2020, 02/26/2020, 10/25/2020  . Pneumococcal Conjugate-13 01/14/2014  . Pneumococcal Polysaccharide-23 07/16/2012, 06/25/2019  . Tdap 11/10/2011  . Varicella 11/16/2011  . Zoster 12/01/2011   Keep all routine maintenance appointments.   Follow up 02/03/21 @ 11:00  Advanced directives: End of life planning; Advance aging; Advanced directives discussed.  Copy of current HCPOA/Living Will requested.    Conditions/risks identified: none new  Follow up in one year for your annual wellness visit.   Preventive Care 24 Years and Older, Male Preventive care refers to  lifestyle choices and visits with your health care provider that can promote health and wellness. What does preventive care include?  A yearly physical exam. This is also called an annual well check.  Dental exams once or twice a year.  Routine eye exams. Ask your health care provider how often you should have your eyes checked.  Personal lifestyle choices, including:  Daily care of your teeth and gums.  Regular physical activity.  Eating a healthy diet.  Avoiding tobacco and drug use.  Limiting alcohol use.  Practicing safe sex.  Taking low doses of aspirin every day.  Taking vitamin and mineral supplements as recommended by your health care provider. What happens during an annual well check? The services and screenings done by your health care provider during your annual well check will depend on your age, overall health, lifestyle risk factors, and family history of disease. Counseling  Your health care provider may ask you questions about your:  Alcohol use.  Tobacco use.  Drug use.  Emotional well-being.  Home and relationship well-being.  Sexual activity.  Eating habits.  History of falls.  Memory and ability to understand (cognition).  Work and work Statistician. Screening  You may have the following tests or measurements:  Height, weight, and BMI.  Blood pressure.  Lipid and cholesterol levels. These may be checked every 5 years, or more frequently if you are over 67 years old.  Skin check.  Lung cancer screening. You may have this  screening every year starting at age 40 if you have a 30-pack-year history of smoking and currently smoke or have quit within the past 15 years.  Fecal occult blood test (FOBT) of the stool. You may have this test every year starting at age 53.  Flexible sigmoidoscopy or colonoscopy. You may have a sigmoidoscopy every 5 years or a colonoscopy every 10 years starting at age 33.  Prostate cancer screening.  Recommendations will vary depending on your family history and other risks.  Hepatitis C blood test.  Hepatitis B blood test.  Sexually transmitted disease (STD) testing.  Diabetes screening. This is done by checking your blood sugar (glucose) after you have not eaten for a while (fasting). You may have this done every 1-3 years.  Abdominal aortic aneurysm (AAA) screening. You may need this if you are a current or former smoker.  Osteoporosis. You may be screened starting at age 68 if you are at high risk. Talk with your health care provider about your test results, treatment options, and if necessary, the need for more tests. Vaccines  Your health care provider may recommend certain vaccines, such as:  Influenza vaccine. This is recommended every year.  Tetanus, diphtheria, and acellular pertussis (Tdap, Td) vaccine. You may need a Td booster every 10 years.  Zoster vaccine. You may need this after age 69.  Pneumococcal 13-valent conjugate (PCV13) vaccine. One dose is recommended after age 42.  Pneumococcal polysaccharide (PPSV23) vaccine. One dose is recommended after age 90. Talk to your health care provider about which screenings and vaccines you need and how often you need them. This information is not intended to replace advice given to you by your health care provider. Make sure you discuss any questions you have with your health care provider. Document Released: 12/18/2015 Document Revised: 08/10/2016 Document Reviewed: 09/22/2015 Elsevier Interactive Patient Education  2017 [place] Prevention in the Home Falls can cause injuries. They can happen to people of all ages. There are many things you can do to make your home safe and to help prevent falls. What can I do on the outside of my home?  Regularly fix the edges of walkways and driveways and fix any cracks.  Remove anything that might make you trip as you walk through a door, such as a raised step or  threshold.  Trim any bushes or trees on the path to your home.  Use bright outdoor lighting.  Clear any walking paths of anything that might make someone trip, such as rocks or tools.  Regularly check to see if handrails are loose or broken. Make sure that both sides of any steps have handrails.  Any raised decks and porches should have guardrails on the edges.  Have any leaves, snow, or ice cleared regularly.  Use sand or salt on walking paths during winter.  Clean up any spills in your garage right away. This includes oil or grease spills. What can I do in the bathroom?  Use night lights.  Install grab bars by the toilet and in the tub and shower. Do not use towel bars as grab bars.  Use non-skid mats or decals in the tub or shower.  If you need to sit down in the shower, use a plastic, non-slip stool.  Keep the floor dry. Clean up any water that spills on the floor as soon as it happens.  Remove soap buildup in the tub or shower regularly.  Attach bath mats securely with double-sided non-slip rug  tape.  Do not have throw rugs and other things on the floor that can make you trip. What can I do in the bedroom?  Use night lights.  Make sure that you have a light by your bed that is easy to reach.  Do not use any sheets or blankets that are too big for your bed. They should not hang down onto the floor.  Have a firm chair that has side arms. You can use this for support while you get dressed.  Do not have throw rugs and other things on the floor that can make you trip. What can I do in the kitchen?  Clean up any spills right away.  Avoid walking on wet floors.  Keep items that you use a lot in easy-to-reach places.  If you need to reach something above you, use a strong step stool that has a grab bar.  Keep electrical cords out of the way.  Do not use floor polish or wax that makes floors slippery. If you must use wax, use non-skid floor wax.  Do not have  throw rugs and other things on the floor that can make you trip. What can I do with my stairs?  Do not leave any items on the stairs.  Make sure that there are handrails on both sides of the stairs and use them. Fix handrails that are broken or loose. Make sure that handrails are as long as the stairways.  Check any carpeting to make sure that it is firmly attached to the stairs. Fix any carpet that is loose or worn.  Avoid having throw rugs at the top or bottom of the stairs. If you do have throw rugs, attach them to the floor with carpet tape.  Make sure that you have a light switch at the top of the stairs and the bottom of the stairs. If you do not have them, ask someone to add them for you. What else can I do to help prevent falls?  Wear shoes that:  Do not have high heels.  Have rubber bottoms.  Are comfortable and fit you well.  Are closed at the toe. Do not wear sandals.  If you use a stepladder:  Make sure that it is fully opened. Do not climb a closed stepladder.  Make sure that both sides of the stepladder are locked into place.  Ask someone to hold it for you, if possible.  Clearly mark and make sure that you can see:  Any grab bars or handrails.  First and last steps.  Where the edge of each step is.  Use tools that help you move around (mobility aids) if they are needed. These include:  Canes.  Walkers.  Scooters.  Crutches.  Turn on the lights when you go into a dark area. Replace any light bulbs as soon as they burn out.  Set up your furniture so you have a clear path. Avoid moving your furniture around.  If any of your floors are uneven, fix them.  If there are any pets around you, be aware of where they are.  Review your medicines with your doctor. Some medicines can make you feel dizzy. This can increase your chance of falling. Ask your doctor what other things that you can do to help prevent falls. This information is not intended to  replace advice given to you by your health care provider. Make sure you discuss any questions you have with your health care provider. Document Released: 09/17/2009  Document Revised: 04/28/2016 Document Reviewed: 12/26/2014 Elsevier Interactive Patient Education  2017 [employer].

## 2021-01-27 ENCOUNTER — Other Ambulatory Visit: Payer: Self-pay | Admitting: Family

## 2021-01-27 DIAGNOSIS — R0981 Nasal congestion: Secondary | ICD-10-CM

## 2021-01-27 DIAGNOSIS — L820 Inflamed seborrheic keratosis: Secondary | ICD-10-CM | POA: Diagnosis not present

## 2021-01-27 DIAGNOSIS — D2261 Melanocytic nevi of right upper limb, including shoulder: Secondary | ICD-10-CM | POA: Diagnosis not present

## 2021-01-27 DIAGNOSIS — D2272 Melanocytic nevi of left lower limb, including hip: Secondary | ICD-10-CM | POA: Diagnosis not present

## 2021-01-27 DIAGNOSIS — L738 Other specified follicular disorders: Secondary | ICD-10-CM | POA: Diagnosis not present

## 2021-01-27 DIAGNOSIS — D225 Melanocytic nevi of trunk: Secondary | ICD-10-CM | POA: Diagnosis not present

## 2021-01-27 DIAGNOSIS — D2271 Melanocytic nevi of right lower limb, including hip: Secondary | ICD-10-CM | POA: Diagnosis not present

## 2021-01-27 DIAGNOSIS — D2262 Melanocytic nevi of left upper limb, including shoulder: Secondary | ICD-10-CM | POA: Diagnosis not present

## 2021-01-27 DIAGNOSIS — L300 Nummular dermatitis: Secondary | ICD-10-CM | POA: Diagnosis not present

## 2021-01-27 DIAGNOSIS — L538 Other specified erythematous conditions: Secondary | ICD-10-CM | POA: Diagnosis not present

## 2021-01-27 DIAGNOSIS — L821 Other seborrheic keratosis: Secondary | ICD-10-CM | POA: Diagnosis not present

## 2021-01-28 ENCOUNTER — Ambulatory Visit
Admission: RE | Admit: 2021-01-28 | Discharge: 2021-01-28 | Disposition: A | Discharge status: Home / Self Care | Payer: PPO | Source: Ambulatory Visit

## 2021-01-28 ENCOUNTER — Other Ambulatory Visit: Payer: Self-pay

## 2021-01-28 VITALS — BP 150/83 | HR 81 | Temp 98.6°F | Resp 16

## 2021-01-28 DIAGNOSIS — Z20822 Contact with and (suspected) exposure to covid-19: Secondary | ICD-10-CM | POA: Diagnosis not present

## 2021-01-28 MED ORDER — FLUTICASONE PROPIONATE 50 MCG/ACT NA SUSP: 2.0000 | Freq: Every day | NASAL | 2 refills | Status: DC

## 2021-01-28 MED ORDER — AMOXICILLIN-POT CLAVULANATE 875-125 MG PO TABS: 1.0000 | ORAL_TABLET | Freq: Two times a day (BID) | ORAL | 0 refills | Status: DC

## 2021-01-28 NOTE — ED Provider Notes (Signed)
Larry Roman    CSN: [id number] Arrival date & time: 01/28/21  1249      History   Chief Complaint Chief Complaint  Patient presents with  . Nasal Congestion  . Cough    HPI Larry Arps. is a 75 y.o. male.   Pt is a 75 year old male that presents with congestion, cough, with yellow, green sputum, ear pressure for appro 6 days.  Had fever  with Tmax 99.8 a few days ago. Also feels congestion  in his chest. Pt states he was tx for facial/sinus pressure and infection with Augmentin approx 1 month and symptoms remained resolved for several weeks.Denies n/v/d, SOB. Has been taking Mucinex, coricidin for symptoms.     Past Medical History:  Diagnosis Date  . Depression   . DM (diabetes mellitus) ([place])   . Extrinsic asthma, unspecified    childhood  . Hives of unknown origin   . HTN (hypertension)   . Other allergy, other than to medicinal agents   . RBBB   . Sleep apnea    25 years ago mild lost weight no longer uses cpap  . Wound of right leg 08/2018   area just below knee size of quarter, reddened around edges    Patient Active Problem List   Diagnosis Date Noted  . Sinus congestion 12/21/2020  . Encounter for long-term (current) use of aspirin 09/21/2020  . HLD (hyperlipidemia) 09/21/2020  . 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 History of gout 08/07/2019  . Other fatigue 12/17/2018  . Primary osteoarthritis of right knee 10/13/2018  . S/P TKR (total knee replacement) using cement, right 09/27/2018  . OSA (obstructive sleep apnea) 04/16/2018  . Tremor 01/17/2018  . Acute left-sided low back pain without sciatica 11/10/2017  . Sinusitis 04/21/2017  . BPH (benign prostatic hyperplasia) 01/10/2017  . Chronic back pain 08/19/2016  . Chronic pain of both knees 09/14/2015  . Erectile dysfunction 09/10/2012  . Right bundle branch block 09/10/2012  . Hypertension 11/10/2011  . Insomnia 11/10/2011  . Diabetes mellitus type 2,  controlled ([place]) 11/10/2011  . Depression, recurrent ([place]) 05/13/2009    Past Surgical History:  Procedure Laterality Date  . BACK SURGERY  1995   ruptured disc encapsulated nerves  . CATARACT EXTRACTION, BILATERAL  2016   Dr. Kerman Passey at [facility]  . EYE SURGERY Bilateral    cataract extractions  . JOINT REPLACEMENT Right    total knee  . TONSILLECTOMY    . TONSILLECTOMY    . TOTAL KNEE ARTHROPLASTY Right 09/27/2018   Procedure: TOTAL KNEE ARTHROPLASTY;  Surgeon: Thornton Park, MD;  Location: [facility] ORS;  Service: Orthopedics;  Laterality: Right;  . TOTAL KNEE ARTHROPLASTY Left 08/15/2019   Procedure: TOTAL KNEE ARTHROPLASTY;  Surgeon: Thornton Park, MD;  Location: [facility] ORS;  Service: Orthopedics;  Laterality: Left;       Home Medications    Prior to Admission medications   Medication Sig Start Date End Date Taking? Authorizing Provider  amLODipine (NORVASC) 10 MG tablet TAKE 1 TABLET (10 MG TOTAL) BY MOUTH DAILY WITH LUNCH. 09/04/20  Yes Arnett, Yvetta Coder, FNP  amoxicillin-clavulanate (AUGMENTIN) 875-125 MG tablet Take 1 tablet by mouth every 12 (twelve) hours. 01/28/21  Yes Bast, Traci A, NP  aspirin 81 MG chewable tablet Chew 81 mg by mouth daily. Every Tuesday and Friday   Yes [provider]  azelastine (ASTELIN) 0.1 % nasal spray Place 1 spray into both  nostrils 2 (two) times daily. Use in each nostril as directed 09/21/20  Yes Arnett, Yvetta Coder, FNP  Cholecalciferol (VITAMIN D3) 2000 UNITS TABS Take 2,000 Units by mouth daily.    Yes [provider]  cimetidine (TAGAMET) 200 MG tablet Take 200 mg by mouth every Monday, Wednesday, and Friday.    Yes [provider]  Cyanocobalamin (B-12) 5000 MCG SUBL Place 5,000 mcg under the tongue daily.    Yes [provider]  DULoxetine (CYMBALTA) 30 MG capsule TAKE 1 CAPSULE (30 MG TOTAL) BY MOUTH 2 (TWO) TIMES DAILY. 09/07/20  Yes Arnett, Yvetta Coder, FNP  fluticasone (FLONASE) 50  MCG/ACT nasal spray Place 2 sprays into both nostrils daily. 01/28/21  Yes Bast, Traci A, NP  hydrochlorothiazide (MICROZIDE) 12.5 MG capsule TAKE 1 CAPSULE BY MOUTH EVERY DAY 12/03/20  Yes Arnett, Yvetta Coder, FNP  loratadine (CLARITIN) 10 MG tablet Take 10 mg by mouth daily.   Yes [provider]  losartan (COZAAR) 100 MG tablet TAKE 1 TABLET BY MOUTH EVERY DAY 09/04/20  Yes Arnett, Yvetta Coder, FNP  metFORMIN (GLUCOPHAGE) 500 MG tablet TAKE 1 TABLET (500 MG TOTAL) BY MOUTH 2 (TWO) TIMES DAILY WITH A MEAL. 09/09/20  Yes Burnard Hawthorne, FNP  metoprolol succinate (TOPROL-XL) 25 MG 24 hr tablet TAKE 1 TABLET BY MOUTH EVERY DAY 12/03/20  Yes Burnard Hawthorne, FNP  pravastatin (PRAVACHOL) 40 MG tablet Take 40 mg by mouth every Monday, Wednesday, and Friday.   Yes [provider]  tamsulosin (FLOMAX) 0.4 MG CAPS capsule TAKE 2 CAPSULES BY MOUTH EVERY DAY 11/16/20  Yes McGowan, Larene Beach A, PA-C  traZODone (DESYREL) 50 MG tablet TAKE 1 TABLET BY MOUTH EVERY DAY AT BEDTIME AS NEEDED FOR SLEEP 12/14/20  Yes Arnett, Yvetta Coder, FNP  ACCU-CHEK SOFTCLIX LANCETS lancets Use up to 4 times daily to check blood sugar. Diagnosis E11.9 10/22/16   Coral Spikes, DO  acetaminophen (TYLENOL) 500 MG tablet Take 1,500 mg by mouth 2 (two) times daily as needed for moderate pain.    [provider]  docusate sodium (COLACE) 50 MG capsule Take 50 mg by mouth daily.    [provider]  glucose blood (ONETOUCH VERIO) test strip TEST 3 TIMES A DAY 10/15/18   Gerlene Fee, NP  NON FORMULARY Diet Type: NCS    [provider]    Family History Family History  Problem Relation Age of Onset  . Heart failure Father   . Heart attack Mother     Social History Social History   Tobacco Use  . Smoking status: Never Smoker  . Smokeless tobacco: Never Used  . Tobacco comment: tobacco use - no  Vaping Use  . Vaping Use: Never used  Substance Use Topics  . Alcohol use: Yes     Alcohol/week: 0.0 standard drinks    Comment: rare  . Drug use: No     Allergies   Ibuprofen   Review of Systems Review of Systems   Physical Exam Triage Vital Signs ED Triage Vitals  Enc Vitals Group     BP 01/28/21 1301 (!) 150/83     Pulse Rate 01/28/21 1301 81     Resp 01/28/21 1301 16     Temp 01/28/21 1301 98.6 F (37 C)     Temp Source 01/28/21 1301 Oral     SpO2 01/28/21 1301 95 %     Weight --      Height --  Head Circumference --      Peak Flow --      Pain Score 01/28/21 1308 6     Pain Loc --      Pain Edu? --      Excl. in [place]? --    No data found.  Updated Vital Signs BP (!) 150/83 (BP Location: Left Arm)   Pulse 81   Temp 98.6 F (37 C) (Oral)   Resp 16   SpO2 95%   Visual Acuity Right Eye Distance:   Left Eye Distance:   Bilateral Distance:    Right Eye Near:   Left Eye Near:    Bilateral Near:     Physical Exam Vitals and nursing note reviewed.  Constitutional:      General: He is not in acute distress.    Appearance: Normal appearance. He is not ill-appearing, toxic-appearing or diaphoretic.  HENT:     Head: Normocephalic and atraumatic.     Right Ear: Tympanic membrane and ear canal normal.     Left Ear: Tympanic membrane and ear canal normal.     Nose: Congestion present.     Mouth/Throat:     Pharynx: Oropharynx is clear.  Eyes:     Conjunctiva/sclera: Conjunctivae normal.  Cardiovascular:     Rate and Rhythm: Normal rate and regular rhythm.  Pulmonary:     Effort: Pulmonary effort is normal.     Breath sounds: Normal breath sounds.  Musculoskeletal:        General: Normal range of motion.     Cervical back: Normal range of motion.  Skin:    General: Skin is warm and dry.  Neurological:     Mental Status: He is alert.  Psychiatric:        Mood and Affect: Mood normal.      UC Treatments / Results  Labs (all labs ordered are listed, but only abnormal results are displayed) Labs Reviewed  NOVEL CORONAVIRUS,  NAA    EKG   Radiology No results found.  Procedures Procedures (including critical care time)  Medications Ordered in UC Medications - No data to display  Initial Impression / Assessment and Plan / UC Course  I have reviewed the triage vital signs and the nursing notes.  Pertinent labs & imaging results that were available during my care of the patient were reviewed by me and considered in my medical decision making (see chart for details).     Sinusitis Treating with amoxicillin Flonase daily 2 sprays in each nostril Continue the mucinex as needed Follow up as needed for continued or worsening symptoms   Final Clinical Impressions(s) / UC Diagnoses   Final diagnoses:  Encounter for laboratory testing for COVID-19 virus     Discharge Instructions     Treating you for a sinus infection Take the medicines as prescribed Flonase daily.  You can continue the mucinex.  Follow up as needed for continued or worsening symptoms     ED Prescriptions    Medication Sig Dispense Auth. Provider   amoxicillin-clavulanate (AUGMENTIN) 875-125 MG tablet Take 1 tablet by mouth every 12 (twelve) hours. 14 tablet Bast, Traci A, NP   fluticasone (FLONASE) 50 MCG/ACT nasal spray Place 2 sprays into both nostrils daily. 16 g Loura Halt A, NP     PDMP not reviewed this encounter.   Orvan July, NP 01/28/21 1348

## 2021-01-28 NOTE — ED Triage Notes (Signed)
Pt c/o congestion, cough, with yellow, green sputum, ear pressure for appro 6 days.  Had "fever" with Tmax 99.8 a few days ago. Also feels "congested in his chest.  Pt states he was tx for facial/sinus pressure and infection with Augmentin approx 1 month and symptoms remained resolved for several weeks.  Denies n/v/d, SOB. Has been taking Mucinex, coricidin for symptoms.

## 2021-01-28 NOTE — Discharge Instructions (Addendum)
Treating you for a sinus infection Take the medicines as prescribed Flonase daily.  You can continue the mucinex.  Follow up as needed for continued or worsening symptoms

## 2021-01-29 LAB — SARS-COV-2, NAA 2 DAY TAT

## 2021-01-29 LAB — NOVEL CORONAVIRUS, NAA: SARS-CoV-2, NAA: NOT DETECTED

## 2021-02-01 ENCOUNTER — Telehealth: Payer: PPO | Admitting: Family

## 2021-02-03 ENCOUNTER — Ambulatory Visit: Payer: PPO | Admitting: Family

## 2021-03-01 ENCOUNTER — Other Ambulatory Visit: Payer: Self-pay | Admitting: Family

## 2021-03-01 DIAGNOSIS — I10 Essential (primary) hypertension: Secondary | ICD-10-CM

## 2021-03-04 ENCOUNTER — Other Ambulatory Visit: Payer: Self-pay | Admitting: Family

## 2021-03-04 DIAGNOSIS — I10 Essential (primary) hypertension: Secondary | ICD-10-CM

## 2021-03-09 ENCOUNTER — Other Ambulatory Visit: Payer: Self-pay | Admitting: Family

## 2021-03-09 DIAGNOSIS — F339 Major depressive disorder, recurrent, unspecified: Secondary | ICD-10-CM

## 2021-03-13 ENCOUNTER — Other Ambulatory Visit: Payer: Self-pay | Admitting: Family

## 2021-03-31 ENCOUNTER — Other Ambulatory Visit: Payer: Self-pay

## 2021-03-31 ENCOUNTER — Ambulatory Visit (INDEPENDENT_AMBULATORY_CARE_PROVIDER_SITE_OTHER): Payer: PPO | Admitting: Family

## 2021-03-31 ENCOUNTER — Encounter: Payer: Self-pay | Admitting: Family

## 2021-03-31 VITALS — BP 130/60 | HR 63 | Temp 97.8°F | Ht 72.0 in | Wt 217.4 lb

## 2021-03-31 DIAGNOSIS — R0981 Nasal congestion: Secondary | ICD-10-CM

## 2021-03-31 DIAGNOSIS — I10 Essential (primary) hypertension: Secondary | ICD-10-CM

## 2021-03-31 DIAGNOSIS — E119 Type 2 diabetes mellitus without complications: Secondary | ICD-10-CM | POA: Diagnosis not present

## 2021-03-31 DIAGNOSIS — Z125 Encounter for screening for malignant neoplasm of prostate: Secondary | ICD-10-CM

## 2021-03-31 DIAGNOSIS — F339 Major depressive disorder, recurrent, unspecified: Secondary | ICD-10-CM

## 2021-03-31 LAB — BASIC METABOLIC PANEL
BUN: 17 mg/dL (ref 6–23)
CO2: 26 mEq/L (ref 19–32)
Calcium: 10.2 mg/dL (ref 8.4–10.5)
Chloride: 100 mEq/L (ref 96–112)
Creatinine, Ser: 0.82 mg/dL (ref 0.40–1.50)
GFR: 87.01 mL/min (ref >60.00)
Glucose, Bld: 164 mg/dL — ABNORMAL HIGH (ref 70–99)
Potassium: 3.8 mEq/L (ref 3.5–5.1)
Sodium: 136 mEq/L (ref 135–145)

## 2021-03-31 LAB — MICROALBUMIN / CREATININE URINE RATIO
Creatinine,U: 104.7 mg/dL
Microalb Creat Ratio: 42.3 mg/g — ABNORMAL HIGH (ref 0.0–30.0)
Microalb, Ur: 44.2 mg/dL — ABNORMAL HIGH (ref 0.0–1.9)

## 2021-03-31 LAB — POCT GLYCOSYLATED HEMOGLOBIN (HGB A1C): Hemoglobin A1C: 6.3 % — AB (ref 4.0–5.6)

## 2021-03-31 LAB — PSA: PSA: 0.64 ng/mL (ref 0.10–4.00)

## 2021-03-31 MED ORDER — FLUTICASONE PROPIONATE 50 MCG/ACT NA SUSP: 2.0000 | Freq: Every day | NASAL | 2 refills | Status: DC

## 2021-03-31 NOTE — Patient Instructions (Signed)
Nice to see you!   

## 2021-03-31 NOTE — Progress Notes (Signed)
Subjective:    Patient ID: Larry Ramus., male    DOB: [birth year], 75 y.o.   MRN: [id number]  CC: Larry Geer. is a 75 y.o. male who presents today for follow up.   HPI: Feels well today No complaints  HTN- compliant losartan 100mg , hctz 12.5mg  , amlodipine 10mg , toprol xl 25mg . No cp , sob  HLD- compliant pravachol  DM - compliant with metformin 500mg  bid   Depression- feels well on cymbalta 60mg  and trazodone 50mg . Feels doses adequate.  Tremor has improved and not bothersome to him.  QTc 446    Declines colonoscopy H/o polyps. No changes to bowel movements.  HISTORY:  Past Medical History:  Diagnosis Date  . Depression   . DM (diabetes mellitus) ([place])   . Extrinsic asthma, unspecified    childhood  . Hives of unknown origin   . HTN (hypertension)   . Other allergy, other than to medicinal agents   . RBBB   . Sleep apnea    25 years ago mild lost weight no longer uses cpap  . Wound of right leg 08/2018   area just below knee size of quarter, reddened around edges   Past Surgical History:  Procedure Laterality Date  . BACK SURGERY  1995   ruptured disc encapsulated nerves  . CATARACT EXTRACTION, BILATERAL  2016   Dr. Kerman Passey at [facility]  . EYE SURGERY Bilateral    cataract extractions  . JOINT REPLACEMENT Right    total knee  . TONSILLECTOMY    . TONSILLECTOMY    . TOTAL KNEE ARTHROPLASTY Right 09/27/2018   Procedure: TOTAL KNEE ARTHROPLASTY;  Surgeon: Thornton Park, MD;  Location: [facility] ORS;  Service: Orthopedics;  Laterality: Right;  . TOTAL KNEE ARTHROPLASTY Left 08/15/2019   Procedure: TOTAL KNEE ARTHROPLASTY;  Surgeon: Thornton Park, MD;  Location: [facility] ORS;  Service: Orthopedics;  Laterality: Left;   Family History  Problem Relation Age of Onset  . Heart failure Father   . Heart attack Mother     Allergies: Ibuprofen Current Outpatient Medications on File Prior to Visit  Medication Sig Dispense Refill  . ACCU-CHEK  SOFTCLIX LANCETS lancets Use up to 4 times daily to check blood sugar. Diagnosis E11.9 100 each 12  . acetaminophen (TYLENOL) 500 MG tablet Take 1,500 mg by mouth 2 (two) times daily as needed for moderate pain.    Marland Kitchen amLODipine (NORVASC) 10 MG tablet TAKE 1 TABLET (10 MG TOTAL) BY MOUTH DAILY WITH LUNCH. 90 tablet 1  . aspirin 81 MG chewable tablet Chew 81 mg by mouth daily. Every Tuesday and Friday    . azelastine (ASTELIN) 0.1 % nasal spray Place 1 spray into both nostrils 2 (two) times daily. Use in each nostril as directed 30 mL 4  . Cholecalciferol (VITAMIN D3) 2000 UNITS TABS Take 2,000 Units by mouth daily.     . cimetidine (TAGAMET) 200 MG tablet Take 200 mg by mouth every Monday, Wednesday, and Friday.     . Cyanocobalamin (B-12) 5000 MCG SUBL Place 5,000 mcg under the tongue daily.     Marland Kitchen docusate sodium (COLACE) 50 MG capsule Take 50 mg by mouth daily.    . DULoxetine (CYMBALTA) 30 MG capsule TAKE 1 CAPSULE (30 MG TOTAL) BY MOUTH 2 (TWO) TIMES DAILY. 180 capsule 2  . glucose blood (ONETOUCH VERIO) test strip TEST 3 TIMES A DAY 100 each 0  . hydrochlorothiazide (MICROZIDE) 12.5 MG capsule TAKE 1 CAPSULE BY MOUTH  EVERY DAY 90 capsule 0  . loratadine (CLARITIN) 10 MG tablet Take 10 mg by mouth daily.    Marland Kitchen losartan (COZAAR) 100 MG tablet TAKE 1 TABLET BY MOUTH EVERY DAY 90 tablet 1  . metFORMIN (GLUCOPHAGE) 500 MG tablet TAKE 1 TABLET BY MOUTH 2 TIMES DAILY WITH A MEAL. 180 tablet 0  . metoprolol succinate (TOPROL-XL) 25 MG 24 hr tablet TAKE 1 TABLET BY MOUTH EVERY DAY 90 tablet 0  . NON FORMULARY Diet Type: NCS    . pravastatin (PRAVACHOL) 40 MG tablet Take 40 mg by mouth every Monday, Wednesday, and Friday.    . tamsulosin (FLOMAX) 0.4 MG CAPS capsule TAKE 2 CAPSULES BY MOUTH EVERY DAY 180 capsule 1  . traZODone (DESYREL) 50 MG tablet TAKE 1 TABLET BY MOUTH AT BEDTIME AS NEEDED FOR SLEEP 90 tablet 0  . triamcinolone cream (KENALOG) 0.1 % Apply 1 application topically 2 (two) times daily.      No current facility-administered medications on file prior to visit.    Social History   Tobacco Use  . Smoking status: Never Smoker  . Smokeless tobacco: Never Used  . Tobacco comment: tobacco use - no  Vaping Use  . Vaping Use: Never used  Substance Use Topics  . Alcohol use: Yes    Alcohol/week: 0.0 standard drinks    Comment: rare  . Drug use: No    Review of Systems  Constitutional: Negative for chills and fever.  Respiratory: Negative for cough.   Cardiovascular: Negative for chest pain and palpitations.  Gastrointestinal: Negative for nausea and vomiting.  Neurological: Positive for tremors.  Psychiatric/Behavioral: Negative for sleep disturbance and suicidal ideas.      Objective:    BP 130/60 (BP Location: Left Arm, Patient Position: Sitting, Cuff Size: Large)   Pulse 63   Temp 97.8 F (36.6 C) (Oral)   Ht 6' (1.829 m)   Wt 217 lb 6.4 oz (98.6 kg)   SpO2 98%   BMI 29.48 kg/m  BP Readings from Last 3 Encounters:  03/31/21 130/60  01/28/21 (!) 150/83  12/29/20 136/70   Wt Readings from Last 3 Encounters:  03/31/21 217 lb 6.4 oz (98.6 kg)  01/11/21 218 lb (98.9 kg)  12/29/20 218 lb (98.9 kg)    Physical Exam Vitals reviewed.  Constitutional:      Appearance: He is well-developed.  Cardiovascular:     Rate and Rhythm: Regular rhythm.     Heart sounds: Normal heart sounds.  Pulmonary:     Effort: Pulmonary effort is normal. No respiratory distress.     Breath sounds: Normal breath sounds. No wheezing, rhonchi or rales.  Skin:    General: Skin is warm and dry.  Neurological:     Mental Status: He is alert.     Comments: Ever so slight tremor in right hand when extended for period of time.   Psychiatric:        Speech: Speech normal.        Behavior: Behavior normal.        Assessment & Plan:   Problem List Items Addressed This Visit      Cardiovascular and Mediastinum   Hypertension    Controlled. Continue  losartan 100mg , hctz  12.5mg  , amlodipine 10mg , toprol xl 25mg .        Respiratory   Sinus congestion   Relevant Medications   fluticasone (FLONASE) 50 MCG/ACT nasal spray     Endocrine   Diabetes mellitus type 2, controlled ([place]) -  Primary    Lab Results  Component Value Date   HGBA1C 6.3 (A) 03/31/2021   Controlled. Continue metformin 500mg  bid      Relevant Orders   POCT HgB A1C (Completed)   Microalbumin / creatinine urine ratio (Completed)   Basic metabolic panel (Completed)     Other   Depression, recurrent ([place])    Controlled. He declines trial increase or change of regimen since tremor noted with starting cymbalta. Tremor is not bothersome nor worsened and declines medication changes today. Continue cymbalta 60mg , trazodone 50mg .Will monitor tremor.        Other Visit Diagnoses    Screening for prostate cancer       Relevant Orders   PSA (Completed)       I have discontinued Chancy Hurter Jr.'s amoxicillin-clavulanate. I am also having him maintain his loratadine, Vitamin D3, B-12, Accu-Chek Softclix Lancets, NON FORMULARY, glucose blood, cimetidine, docusate sodium, acetaminophen, DULoxetine, azelastine, tamsulosin, pravastatin, aspirin, hydrochlorothiazide, metoprolol succinate, losartan, amLODipine, traZODone, metFORMIN, triamcinolone cream, and fluticasone.   Meds ordered this encounter  Medications  . fluticasone (FLONASE) 50 MCG/ACT nasal spray    Sig: Place 2 sprays into both nostrils daily.    Dispense:  16 g    Refill:  2    Order Specific Question:   Supervising Provider    Answer:   Crecencio Mc [[id number]]    Return precautions given.   Risks, benefits, and alternatives of the medications and treatment plan prescribed today were discussed, and patient expressed understanding.   Education regarding symptom management and diagnosis given to patient on AVS.  Continue to follow with Burnard Hawthorne, FNP for routine health maintenance.   [place]  agreed with plan.   Mable Paris, FNP

## 2021-04-02 ENCOUNTER — Telehealth: Payer: Self-pay | Admitting: Family

## 2021-04-02 DIAGNOSIS — R809 Proteinuria, unspecified: Secondary | ICD-10-CM

## 2021-04-02 NOTE — Assessment & Plan Note (Signed)
Controlled. He declines trial increase or change of regimen since tremor noted with starting cymbalta. Tremor is not bothersome nor worsened and declines medication changes today. Continue cymbalta 60mg , trazodone 50mg .Will monitor tremor.

## 2021-04-02 NOTE — Telephone Encounter (Signed)
PT called back to return the missed call from earlier from [place].

## 2021-04-02 NOTE — Telephone Encounter (Signed)
Pt is willing to see nephrology & would like referral placed.

## 2021-04-02 NOTE — Telephone Encounter (Signed)
Pt knows to let us know if he does not hear in regards to appointment in the next week to 10 days.

## 2021-04-02 NOTE — Assessment & Plan Note (Signed)
Controlled. Continue  losartan 100mg , hctz 12.5mg  , amlodipine 10mg , toprol xl 25mg .

## 2021-04-02 NOTE — Assessment & Plan Note (Signed)
Lab Results  Component Value Date   HGBA1C 6.3 (A) 03/31/2021   Controlled. Continue metformin 500mg  bid

## 2021-04-05 NOTE — Telephone Encounter (Signed)
Referral placed.

## 2021-04-05 NOTE — Addendum Note (Signed)
Addended by: Burnard Hawthorne on: 04/05/2021 02:06 PM   Modules accepted: Orders

## 2021-04-07 ENCOUNTER — Other Ambulatory Visit: Payer: Self-pay | Admitting: Family

## 2021-04-07 DIAGNOSIS — E785 Hyperlipidemia, unspecified: Secondary | ICD-10-CM

## 2021-05-06 ENCOUNTER — Other Ambulatory Visit: Payer: Self-pay | Admitting: Nephrology

## 2021-05-06 ENCOUNTER — Other Ambulatory Visit (HOSPITAL_COMMUNITY): Payer: Self-pay | Admitting: Nephrology

## 2021-05-06 DIAGNOSIS — R809 Proteinuria, unspecified: Secondary | ICD-10-CM

## 2021-05-06 DIAGNOSIS — E119 Type 2 diabetes mellitus without complications: Secondary | ICD-10-CM | POA: Diagnosis not present

## 2021-05-06 DIAGNOSIS — I10 Essential (primary) hypertension: Secondary | ICD-10-CM | POA: Diagnosis not present

## 2021-05-06 DIAGNOSIS — R8290 Unspecified abnormal findings in urine: Secondary | ICD-10-CM | POA: Diagnosis not present

## 2021-05-21 ENCOUNTER — Other Ambulatory Visit: Payer: Self-pay | Admitting: Family

## 2021-05-21 DIAGNOSIS — I10 Essential (primary) hypertension: Secondary | ICD-10-CM

## 2021-05-21 DIAGNOSIS — F339 Major depressive disorder, recurrent, unspecified: Secondary | ICD-10-CM

## 2021-05-28 ENCOUNTER — Ambulatory Visit: Payer: Self-pay | Admitting: Urology

## 2021-06-04 ENCOUNTER — Other Ambulatory Visit: Payer: Self-pay | Admitting: Urology

## 2021-06-04 ENCOUNTER — Other Ambulatory Visit: Payer: Self-pay | Admitting: Family

## 2021-06-04 DIAGNOSIS — N400 Enlarged prostate without lower urinary tract symptoms: Secondary | ICD-10-CM

## 2021-06-08 ENCOUNTER — Other Ambulatory Visit: Payer: Self-pay

## 2021-06-08 ENCOUNTER — Ambulatory Visit
Admission: RE | Admit: 2021-06-08 | Discharge: 2021-06-08 | Disposition: A | Discharge status: Home / Self Care | Payer: PPO | Source: Ambulatory Visit | Attending: Nephrology | Admitting: Nephrology

## 2021-06-08 DIAGNOSIS — R8290 Unspecified abnormal findings in urine: Secondary | ICD-10-CM | POA: Insufficient documentation

## 2021-06-08 DIAGNOSIS — R809 Proteinuria, unspecified: Secondary | ICD-10-CM | POA: Insufficient documentation

## 2021-06-08 DIAGNOSIS — N281 Cyst of kidney, acquired: Secondary | ICD-10-CM | POA: Diagnosis not present

## 2021-06-08 DIAGNOSIS — N1330 Unspecified hydronephrosis: Secondary | ICD-10-CM | POA: Diagnosis not present

## 2021-06-17 DIAGNOSIS — N1339 Other hydronephrosis: Secondary | ICD-10-CM | POA: Diagnosis not present

## 2021-06-17 DIAGNOSIS — R809 Proteinuria, unspecified: Secondary | ICD-10-CM | POA: Diagnosis not present

## 2021-06-17 DIAGNOSIS — K219 Gastro-esophageal reflux disease without esophagitis: Secondary | ICD-10-CM | POA: Diagnosis not present

## 2021-06-17 DIAGNOSIS — N281 Cyst of kidney, acquired: Secondary | ICD-10-CM | POA: Diagnosis not present

## 2021-06-17 DIAGNOSIS — E785 Hyperlipidemia, unspecified: Secondary | ICD-10-CM | POA: Diagnosis not present

## 2021-06-17 DIAGNOSIS — I10 Essential (primary) hypertension: Secondary | ICD-10-CM | POA: Diagnosis not present

## 2021-06-17 DIAGNOSIS — E119 Type 2 diabetes mellitus without complications: Secondary | ICD-10-CM | POA: Diagnosis not present

## 2021-06-17 DIAGNOSIS — R8290 Unspecified abnormal findings in urine: Secondary | ICD-10-CM | POA: Diagnosis not present

## 2021-06-17 DIAGNOSIS — N400 Enlarged prostate without lower urinary tract symptoms: Secondary | ICD-10-CM | POA: Diagnosis not present

## 2021-06-17 DIAGNOSIS — Z8739 Personal history of other diseases of the musculoskeletal system and connective tissue: Secondary | ICD-10-CM | POA: Diagnosis not present

## 2021-06-27 ENCOUNTER — Other Ambulatory Visit: Payer: Self-pay | Admitting: Family

## 2021-06-27 DIAGNOSIS — R0981 Nasal congestion: Secondary | ICD-10-CM

## 2021-06-30 ENCOUNTER — Ambulatory Visit: Payer: PPO | Admitting: Family

## 2021-07-01 ENCOUNTER — Other Ambulatory Visit: Payer: Self-pay

## 2021-07-01 ENCOUNTER — Ambulatory Visit: Payer: PPO | Admitting: Urology

## 2021-07-01 ENCOUNTER — Encounter: Payer: Self-pay | Admitting: Urology

## 2021-07-01 VITALS — BP 151/70 | HR 74 | Ht 72.0 in | Wt 217.0 lb

## 2021-07-01 DIAGNOSIS — N400 Enlarged prostate without lower urinary tract symptoms: Secondary | ICD-10-CM

## 2021-07-01 DIAGNOSIS — N1330 Unspecified hydronephrosis: Secondary | ICD-10-CM

## 2021-07-01 LAB — BLADDER SCAN AMB NON-IMAGING: SCA Result: 0

## 2021-07-01 NOTE — Progress Notes (Signed)
07/01/2021 10:03 AM   Larry Roman. [birth year] [id number]  Referring provider: Lavonia Dana, MD [id number] Larry Roman Larry Roman,  Larry Roman [id number]  Chief Complaint  Patient presents with   Hydronephrosis    HPI: 75 y.o. male referred for hydronephrosis.  Seen 05/28/2020 for BPH with moderate LUTS PVR 0 mL Was on tamsulosin which was titrated to 0.8 mg RUS 06/08/2021 ordered by nephrology moderate left hydronephrosis PSA 03/31/2021 0.64 Stable LUTS Denies flank, abdominal or pelvic pain Denies gross hematuria CT 2011 showed no hydronephrosis   PMH: Past Medical History:  Diagnosis Date   Depression    DM (diabetes mellitus) ([place])    Extrinsic asthma, unspecified    childhood   Hives of unknown origin    HTN (hypertension)    Other allergy, other than to medicinal agents    RBBB    Sleep apnea    25 years ago mild lost weight no longer uses cpap   Wound of right leg 08/2018   area just below knee size of quarter, reddened around edges    Surgical History: Past Surgical History:  Procedure Laterality Date   BACK SURGERY  1995   ruptured disc encapsulated nerves   CATARACT EXTRACTION, BILATERAL  2016   Dr. Kerman Roman at [facility] Bilateral    cataract extractions   JOINT REPLACEMENT Right    total knee   TONSILLECTOMY     TONSILLECTOMY     TOTAL KNEE ARTHROPLASTY Right 09/27/2018   Procedure: TOTAL KNEE ARTHROPLASTY;  Surgeon: Larry Park, MD;  Location: [facility] ORS;  Service: Orthopedics;  Laterality: Right;   TOTAL KNEE ARTHROPLASTY Left 08/15/2019   Procedure: TOTAL KNEE ARTHROPLASTY;  Surgeon: Larry Park, MD;  Location: [facility] ORS;  Service: Orthopedics;  Laterality: Left;    Home Medications:  Allergies as of 07/01/2021       Reactions   Ibuprofen Other (See Comments)   Ibuprofen and motrin causes bp to elevate        Medication List        Accurate as of July 01, 2021 10:03 AM. If you have any  questions, ask your nurse or doctor.          STOP taking these medications    triamcinolone cream 0.1 % Commonly known as: KENALOG Stopped by: Larry Sons, MD       TAKE these medications    Accu-Chek Softclix Lancets lancets Use up to 4 times daily to check blood sugar. Diagnosis E11.9   acetaminophen 500 MG tablet Commonly known as: TYLENOL Take 1,500 mg by mouth 2 (two) times daily as needed for moderate pain.   amLODipine 10 MG tablet Commonly known as: NORVASC TAKE 1 TABLET (10 MG TOTAL) BY MOUTH DAILY WITH LUNCH.   aspirin 81 MG chewable tablet Chew 81 mg by mouth daily. Every Tuesday and Friday   azelastine 0.1 % nasal spray Commonly known as: ASTELIN Place 1 spray into both nostrils 2 (two) times daily. Use in each nostril as directed   B-12 5000 MCG Subl Place 5,000 mcg under the tongue daily.   cimetidine 200 MG tablet Commonly known as: TAGAMET Take 200 mg by mouth every Monday, Wednesday, and Friday.   docusate sodium 50 MG capsule Commonly known as: COLACE Take 50 mg by mouth daily.   DULoxetine 30 MG capsule Commonly known as: CYMBALTA TAKE 1 CAPSULE (30 MG TOTAL) BY MOUTH 2 (TWO) TIMES DAILY.   fluticasone  50 MCG/ACT nasal spray Commonly known as: FLONASE SPRAY 2 SPRAYS INTO EACH NOSTRIL EVERY DAY   glucose blood test strip Commonly known as: OneTouch Verio TEST 3 TIMES A DAY   hydrochlorothiazide 12.5 MG capsule Commonly known as: MICROZIDE TAKE 1 CAPSULE BY MOUTH EVERY DAY   loratadine 10 MG tablet Commonly known as: CLARITIN Take 10 mg by mouth daily.   losartan 100 MG tablet Commonly known as: COZAAR TAKE 1 TABLET BY MOUTH EVERY DAY   metFORMIN 500 MG tablet Commonly known as: GLUCOPHAGE TAKE 1 TABLET BY MOUTH 2 TIMES DAILY WITH A MEAL.   metoprolol succinate 25 MG 24 hr tablet Commonly known as: TOPROL-XL TAKE 1 TABLET BY MOUTH EVERY DAY   NON FORMULARY Diet Type: NCS   pravastatin 40 MG tablet Commonly known  as: PRAVACHOL TAKE 1 TABLET BY MOUTH EVERY DAY   tamsulosin 0.4 MG Caps capsule Commonly known as: FLOMAX TAKE 2 CAPSULES BY MOUTH EVERY DAY   traZODone 50 MG tablet Commonly known as: DESYREL TAKE 1 TABLET BY MOUTH EVERY DAY AT BEDTIME AS NEEDED FOR SLEEP   Vitamin D3 50 MCG (2000 UT) Tabs Take 2,000 Units by mouth daily.        Allergies:  Allergies  Allergen Reactions   Ibuprofen Other (See Comments)    Ibuprofen and motrin causes bp to elevate    Family History: Family History  Problem Relation Age of Onset   Heart failure Father    Heart attack Mother     Social History:  reports that he has never smoked. He has never used smokeless tobacco. He reports current alcohol use. He reports that he does not use drugs.   Physical Exam: BP (!) 151/70   Pulse 74   Ht 6' (1.829 m)   Wt 217 lb (98.4 kg)   BMI 29.43 kg/m   Constitutional:  Alert and oriented, No acute distress. HEENT: [place] AT, moist mucus membranes.  Trachea midline, no masses. Cardiovascular: No clubbing, cyanosis, or edema. Respiratory: Normal respiratory effort, no increased work of breathing. Neurologic: Grossly intact, no focal deficits, moving all 4 extremities. Psychiatric: Normal mood and affect.   Pertinent Imaging: Ultrasound images personally reviewed and interpreted.  Bladder is nondistended.  No hydroureter seen  US RENAL  Narrative CLINICAL DATA:  Proteinuria unspecified type. History diabetes mellitus, hypertension  EXAM: RENAL / URINARY TRACT ULTRASOUND COMPLETE  COMPARISON:  CT abdomen and pelvis 12/28/2009  FINDINGS: Right Kidney:  Renal measurements: 12.2 x 6.1 x 5.4 cm = volume: 208 mL. Normal cortical thickness and echogenicity. Exophytic cyst at upper pole 3.5 x 2.0 x 3.1 cm, mildly increased in size since prior CT. No additional mass, hydronephrosis, or shadowing calcification.  Left Kidney:  Renal measurements: 13.8 x 6.1 x 6.1 cm = volume: 268 mL. Mild cortical  thinning. Slightly increased cortical echogenicity. Parenchymal calcification at mid kidney 12 mm diameter. Moderate LEFT hydronephrosis. Small exophytic cyst at upper pole 2.8 cm diameter. Additional inferior peripelvic renal cyst 3.2 cm diameter. No solid mass.  Bladder:  Appears normal for partial degree of bladder distention.  Other:  N/A  IMPRESSION: BILATERAL renal cysts.  Moderate LEFT hydronephrosis of uncertain etiology; consider further evaluation by CT.   Electronically Signed By: Larry Roman M.D. On: 06/08/2021 18:22   Assessment & Plan:    1.  Left hydronephrosis New problem Asymptomatic Recommend further evaluation with CT urogram  2.  BPH with LUTS Stable on tamsulosin Bladder scan PVR today 0 mL  Larry Roman, [place] [address] [place], [place] [id number] [phone]

## 2021-07-19 ENCOUNTER — Other Ambulatory Visit: Payer: Self-pay

## 2021-07-19 ENCOUNTER — Ambulatory Visit
Admission: RE | Admit: 2021-07-19 | Discharge: 2021-07-19 | Disposition: A | Discharge status: Home / Self Care | Payer: PPO | Source: Ambulatory Visit | Attending: Urology | Admitting: Urology

## 2021-07-19 DIAGNOSIS — N132 Hydronephrosis with renal and ureteral calculous obstruction: Secondary | ICD-10-CM | POA: Diagnosis not present

## 2021-07-19 DIAGNOSIS — N1330 Unspecified hydronephrosis: Secondary | ICD-10-CM | POA: Diagnosis not present

## 2021-07-19 DIAGNOSIS — N281 Cyst of kidney, acquired: Secondary | ICD-10-CM | POA: Diagnosis not present

## 2021-07-19 LAB — POCT I-STAT CREATININE: Creatinine, Ser: 0.9 mg/dL (ref 0.61–1.24)

## 2021-07-19 MED ORDER — IOHEXOL 350 MG/ML SOLN
100.0000 mL | Freq: Once | INTRAVENOUS | Status: AC | PRN
  Administered 2021-07-19: 100 mL via INTRAVENOUS

## 2021-08-13 ENCOUNTER — Ambulatory Visit: Payer: PPO | Admitting: Urology

## 2021-08-13 ENCOUNTER — Other Ambulatory Visit: Payer: Self-pay

## 2021-08-13 ENCOUNTER — Encounter: Payer: Self-pay | Admitting: Urology

## 2021-08-13 VITALS — BP 153/81 | HR 8 | Ht 72.0 in | Wt 215.0 lb

## 2021-08-13 DIAGNOSIS — N1330 Unspecified hydronephrosis: Secondary | ICD-10-CM

## 2021-08-13 DIAGNOSIS — N201 Calculus of ureter: Secondary | ICD-10-CM

## 2021-08-13 DIAGNOSIS — N132 Hydronephrosis with renal and ureteral calculous obstruction: Secondary | ICD-10-CM

## 2021-08-13 NOTE — H&P (View-Only) (Signed)
08/13/2021 12:42 PM   Larry Roman. 23-Jul-1946 [id number]  Referring provider: Burnard Hawthorne, FNP [address] [place],  [place] [address]  Chief Complaint  Patient presents with   Nephrolithiasis    HPI: 75 y.o. male presents for follow-up of left hydronephrosis.  Seen 07/01/2021 with asymptomatic moderate left hydronephrosis on ultrasound ordered by nephrology CT urogram performed 07/19/2021 which showed bilateral renal cyst, largest 3.8 cm right upper pole; nonobstructing bilateral renal calculi and a 14 mm left proximal ureteral calculus with moderate left hydronephrosis/hydroureter He remains asymptomatic He presents today to discuss treatment options   PMH: Past Medical History:  Diagnosis Date   Depression    DM (diabetes mellitus) ([place])    Extrinsic asthma, unspecified    childhood   Hives of unknown origin    HTN (hypertension)    Other allergy, other than to medicinal agents    RBBB    Sleep apnea    25 years ago mild lost weight no longer uses cpap   Wound of right leg 08/2018   area just below knee size of quarter, reddened around edges    Surgical History: Past Surgical History:  Procedure Laterality Date   BACK SURGERY  1995   ruptured disc encapsulated nerves   CATARACT EXTRACTION, BILATERAL  2016   Dr. Kerman Passey at [facility] Bilateral    cataract extractions   JOINT REPLACEMENT Right    total knee   TONSILLECTOMY     TONSILLECTOMY     TOTAL KNEE ARTHROPLASTY Right 09/27/2018   Procedure: TOTAL KNEE ARTHROPLASTY;  Surgeon: Thornton Park, MD;  Location: [facility] ORS;  Service: Orthopedics;  Laterality: Right;   TOTAL KNEE ARTHROPLASTY Left 08/15/2019   Procedure: TOTAL KNEE ARTHROPLASTY;  Surgeon: Thornton Park, MD;  Location: [facility] OR[facility];  Service: Orthopedics;  Laterality: Left;    Home Medications:  Allergies as of 08/13/2021       Reactions   Ibuprofen Other (See Comments)   Ibuprofen and motrin  causes bp to elevate        Medication List        Accurate as of August 13, 2021 12:42 PM. If you have any questions, ask your nurse or doctor.          Accu-Chek Softclix Lancets lancets Use up to 4 times daily to check blood sugar. Diagnosis E11.9   acetaminophen 500 MG tablet Commonly known as: TYLENOL Take 1,500 mg by mouth 2 (two) times daily as needed for moderate pain.   amLODipine 10 MG tablet Commonly known as: NORVASC TAKE 1 TABLET (10 MG TOTAL) BY MOUTH DAILY WITH LUNCH.   aspirin 81 MG chewable tablet Chew 81 mg by mouth daily. Every Tuesday and Friday   azelastine 0.1 % nasal spray Commonly known as: ASTELIN Place 1 spray into both nostrils 2 (two) times daily. Use in each nostril as directed   B-12 5000 MCG Subl Place 5,000 mcg under the tongue daily.   cimetidine 200 MG tablet Commonly known as: TAGAMET Take 200 mg by mouth every Monday, Wednesday, and Friday.   docusate sodium 50 MG capsule Commonly known as: COLACE Take 50 mg by mouth daily.   DULoxetine 30 MG capsule Commonly known as: CYMBALTA TAKE 1 CAPSULE (30 MG TOTAL) BY MOUTH 2 (TWO) TIMES DAILY.   fluticasone 50 MCG/ACT nasal spray Commonly known as: FLONASE SPRAY 2 SPRAYS INTO EACH NOSTRIL EVERY DAY   glucose blood test strip Commonly  known as: Civil engineer, contracting TEST 3 TIMES A DAY   hydrochlorothiazide 12.5 MG capsule Commonly known as: MICROZIDE TAKE 1 CAPSULE BY MOUTH EVERY DAY   loratadine 10 MG tablet Commonly known as: CLARITIN Take 10 mg by mouth daily.   losartan 100 MG tablet Commonly known as: COZAAR TAKE 1 TABLET BY MOUTH EVERY DAY   metFORMIN 500 MG tablet Commonly known as: GLUCOPHAGE TAKE 1 TABLET BY MOUTH 2 TIMES DAILY WITH A MEAL.   metoprolol succinate 25 MG 24 hr tablet Commonly known as: TOPROL-XL TAKE 1 TABLET BY MOUTH EVERY DAY   NON FORMULARY Diet Type: NCS   pravastatin 40 MG tablet Commonly known as: PRAVACHOL TAKE 1 TABLET BY MOUTH EVERY  DAY   tamsulosin 0.4 MG Caps capsule Commonly known as: FLOMAX TAKE 2 CAPSULES BY MOUTH EVERY DAY   traZODone 50 MG tablet Commonly known as: DESYREL TAKE 1 TABLET BY MOUTH EVERY DAY AT BEDTIME AS NEEDED FOR SLEEP   Vitamin D3 50 MCG (2000 UT) Tabs Take 2,000 Units by mouth daily.        Allergies:  Allergies  Allergen Reactions   Ibuprofen Other (See Comments)    Ibuprofen and motrin causes bp to elevate    Family History: Family History  Problem Relation Age of Onset   Heart failure Father    Heart attack Mother     Social History:  reports that he has never smoked. He has never used smokeless tobacco. He reports current alcohol use. He reports that he does not use drugs.   Physical Exam: BP (!) 153/81   Pulse (!) 8   Ht 6' (1.829 m)   Wt 215 lb (97.5 kg)   BMI 29.16 kg/m   Constitutional:  Alert and oriented, No acute distress. HEENT: [place] AT, moist mucus membranes.  Trachea midline, no masses. Cardiovascular: No clubbing, cyanosis, or edema. Respiratory: Normal respiratory effort, no increased work of breathing. Skin: No rashes, bruises or suspicious lesions. Neurologic: Grossly intact, no focal deficits, moving all 4 extremities. Psychiatric: Normal mood and affect.   Pertinent Imaging: CT images were personally reviewed and interpreted.  Heart calculus with density measuring 1700 HU  CT HEMATURIA WORKUP  Narrative CLINICAL DATA:  Left hydronephrosis on CT  EXAM: CT ABDOMEN AND PELVIS WITHOUT AND WITH CONTRAST  TECHNIQUE: Multidetector CT imaging of the abdomen and pelvis was performed following the standard protocol before and following the bolus administration of intravenous contrast.  CONTRAST:  156m OMNIPAQUE IOHEXOL 350 MG/ML SOLN  COMPARISON:  Renal ultrasound dated 06/08/2021  FINDINGS: Lower chest: Lung bases are clear.  Hepatobiliary: Unenhanced liver is unremarkable.  Gallbladder is unremarkable. No intrahepatic or extrahepatic  ductal dilatation.  Pancreas: Within normal limits.  Spleen: Within limits.  Adrenals/Urinary Tract: Adrenal glands are within normal limits.  Bilateral renal cysts, measuring up to 3.8 cm in the right upper pole (series 2/image 36). Two nonobstructing right renal calculi measuring 2 mm. 6 mm nonobstructing left lower pole renal calculus (coronal image 110). Moderate left hydroureteronephrosis. Associated 14 mm proximal left ureteral calculus at the L3 level (coronal image 91).  Bladder is within normal limits.  Stomach/Bowel: Stomach is within normal limits.  No evidence of bowel obstruction.  Normal appendix (series 2/image 62).  Vascular/Lymphatic: No evidence of abdominal aortic aneurysm.  No suspicious abdominopelvic lymphadenopathy.  Reproductive: Prostate is unremarkable.  Other: No abdominopelvic ascites.  Musculoskeletal: Degenerative changes of the visualized thoracolumbar spine.  IMPRESSION: 14 mm proximal left ureteral calculus at the  L3 level. Associated moderate left hydroureteronephrosis.  Additional bilateral nonobstructing renal calculi measuring up to 6 mm on the left.  Bilateral renal cysts, measuring to 3.8 cm on the right.   Electronically Signed By: Julian Hy M.D. On: 07/19/2021 22:58    Assessment & Plan:    1.  Left proximal ureteral calculus Moderate left hydronephrosis secondary to obstructing calculus We discussed various treatment options for urolithiasis including observation with or without medical expulsive therapy, shockwave lithotripsy (SWL), ureteroscopy and laser lithotripsy with stent placement, and percutaneous nephrolithotomy. We discussed that management is based on stone size, location, density, patient co-morbidities, and patient preference.  Stones <53m in size have a >80% spontaneous passage rate. Data surrounding the use of tamsulosin for medical expulsive therapy is controversial, but meta analyses suggests  it is most efficacious for distal stones between 5-128min size. SWL has a lower stone free rate in a single procedure, but also a lower complication rate compared to ureteroscopy and avoids a stent and associated stent related symptoms. Possible complications include renal hematoma, steinstrasse, and need for additional treatment. Ureteroscopy with laser lithotripsy and stent placement has a higher stone free rate than SWL in a single procedure, however increased complication rate including possible infection, ureteral injury, bleeding, and stent related morbidity. Common stent related symptoms include dysuria, urgency/frequency, and flank pain. After an extensive discussion of the risks and benefits of the above treatment options and the high stone density, the patient would like to proceed with ureteroscopic stone removal.   ScAbbie SonsMDFairdale[address]SuLazy MountainuBowmanNC [phone]

## 2021-08-13 NOTE — Progress Notes (Signed)
08/13/2021 12:42 PM   Larry Roman. 06-09-1946 [id number]  Referring provider: Burnard Hawthorne, FNP [address] [place],  [place] [address]  Chief Complaint  Patient presents with   Nephrolithiasis    HPI: 75 y.o. male presents for follow-up of left hydronephrosis.  Seen 07/01/2021 with asymptomatic moderate left hydronephrosis on ultrasound ordered by nephrology CT urogram performed 07/19/2021 which showed bilateral renal cyst, largest 3.8 cm right upper pole; nonobstructing bilateral renal calculi and a 14 mm left proximal ureteral calculus with moderate left hydronephrosis/hydroureter He remains asymptomatic He presents today to discuss treatment options   PMH: Past Medical History:  Diagnosis Date   Depression    DM (diabetes mellitus) ([place])    Extrinsic asthma, unspecified    childhood   Hives of unknown origin    HTN (hypertension)    Other allergy, other than to medicinal agents    RBBB    Sleep apnea    25 years ago mild lost weight no longer uses cpap   Wound of right leg 08/2018   area just below knee size of quarter, reddened around edges    Surgical History: Past Surgical History:  Procedure Laterality Date   BACK SURGERY  1995   ruptured disc encapsulated nerves   CATARACT EXTRACTION, BILATERAL  2016   Dr. Kerman Passey at [facility] Bilateral    cataract extractions   JOINT REPLACEMENT Right    total knee   TONSILLECTOMY     TONSILLECTOMY     TOTAL KNEE ARTHROPLASTY Right 09/27/2018   Procedure: TOTAL KNEE ARTHROPLASTY;  Surgeon: Thornton Park, MD;  Location: [facility] ORS;  Service: Orthopedics;  Laterality: Right;   TOTAL KNEE ARTHROPLASTY Left 08/15/2019   Procedure: TOTAL KNEE ARTHROPLASTY;  Surgeon: Thornton Park, MD;  Location: [facility] ORS;  Service: Orthopedics;  Laterality: Left;    Home Medications:  Allergies as of 08/13/2021       Reactions   Ibuprofen Other (See Comments)   Ibuprofen and motrin  causes bp to elevate        Medication List        Accurate as of August 13, 2021 12:42 PM. If you have any questions, ask your nurse or doctor.          Accu-Chek Softclix Lancets lancets Use up to 4 times daily to check blood sugar. Diagnosis E11.9   acetaminophen 500 MG tablet Commonly known as: TYLENOL Take 1,500 mg by mouth 2 (two) times daily as needed for moderate pain.   amLODipine 10 MG tablet Commonly known as: NORVASC TAKE 1 TABLET (10 MG TOTAL) BY MOUTH DAILY WITH LUNCH.   aspirin 81 MG chewable tablet Chew 81 mg by mouth daily. Every Tuesday and Friday   azelastine 0.1 % nasal spray Commonly known as: ASTELIN Place 1 spray into both nostrils 2 (two) times daily. Use in each nostril as directed   B-12 5000 MCG Subl Place 5,000 mcg under the tongue daily.   cimetidine 200 MG tablet Commonly known as: TAGAMET Take 200 mg by mouth every Monday, Wednesday, and Friday.   docusate sodium 50 MG capsule Commonly known as: COLACE Take 50 mg by mouth daily.   DULoxetine 30 MG capsule Commonly known as: CYMBALTA TAKE 1 CAPSULE (30 MG TOTAL) BY MOUTH 2 (TWO) TIMES DAILY.   fluticasone 50 MCG/ACT nasal spray Commonly known as: FLONASE SPRAY 2 SPRAYS INTO EACH NOSTRIL EVERY DAY   glucose blood test strip Commonly  known as: Civil engineer, contracting TEST 3 TIMES A DAY   hydrochlorothiazide 12.5 MG capsule Commonly known as: MICROZIDE TAKE 1 CAPSULE BY MOUTH EVERY DAY   loratadine 10 MG tablet Commonly known as: CLARITIN Take 10 mg by mouth daily.   losartan 100 MG tablet Commonly known as: COZAAR TAKE 1 TABLET BY MOUTH EVERY DAY   metFORMIN 500 MG tablet Commonly known as: GLUCOPHAGE TAKE 1 TABLET BY MOUTH 2 TIMES DAILY WITH A MEAL.   metoprolol succinate 25 MG 24 hr tablet Commonly known as: TOPROL-XL TAKE 1 TABLET BY MOUTH EVERY DAY   NON FORMULARY Diet Type: NCS   pravastatin 40 MG tablet Commonly known as: PRAVACHOL TAKE 1 TABLET BY MOUTH EVERY  DAY   tamsulosin 0.4 MG Caps capsule Commonly known as: FLOMAX TAKE 2 CAPSULES BY MOUTH EVERY DAY   traZODone 50 MG tablet Commonly known as: DESYREL TAKE 1 TABLET BY MOUTH EVERY DAY AT BEDTIME AS NEEDED FOR SLEEP   Vitamin D3 50 MCG (2000 UT) Tabs Take 2,000 Units by mouth daily.        Allergies:  Allergies  Allergen Reactions   Ibuprofen Other (See Comments)    Ibuprofen and motrin causes bp to elevate    Family History: Family History  Problem Relation Age of Onset   Heart failure Father    Heart attack Mother     Social History:  reports that he has never smoked. He has never used smokeless tobacco. He reports current alcohol use. He reports that he does not use drugs.   Physical Exam: BP (!) 153/81   Pulse (!) 8   Ht 6' (1.829 m)   Wt 215 lb (97.5 kg)   BMI 29.16 kg/m   Constitutional:  Alert and oriented, No acute distress. HEENT: [place] AT, moist mucus membranes.  Trachea midline, no masses. Cardiovascular: No clubbing, cyanosis, or edema. Respiratory: Normal respiratory effort, no increased work of breathing. Skin: No rashes, bruises or suspicious lesions. Neurologic: Grossly intact, no focal deficits, moving all 4 extremities. Psychiatric: Normal mood and affect.   Pertinent Imaging: CT images were personally reviewed and interpreted.  Heart calculus with density measuring 1700 HU  CT HEMATURIA WORKUP  Narrative CLINICAL DATA:  Left hydronephrosis on CT  EXAM: CT ABDOMEN AND PELVIS WITHOUT AND WITH CONTRAST  TECHNIQUE: Multidetector CT imaging of the abdomen and pelvis was performed following the standard protocol before and following the bolus administration of intravenous contrast.  CONTRAST:  174m OMNIPAQUE IOHEXOL 350 MG/ML SOLN  COMPARISON:  Renal ultrasound dated 06/08/2021  FINDINGS: Lower chest: Lung bases are clear.  Hepatobiliary: Unenhanced liver is unremarkable.  Gallbladder is unremarkable. No intrahepatic or extrahepatic  ductal dilatation.  Pancreas: Within normal limits.  Spleen: Within limits.  Adrenals/Urinary Tract: Adrenal glands are within normal limits.  Bilateral renal cysts, measuring up to 3.8 cm in the right upper pole (series 2/image 36). Two nonobstructing right renal calculi measuring 2 mm. 6 mm nonobstructing left lower pole renal calculus (coronal image 110). Moderate left hydroureteronephrosis. Associated 14 mm proximal left ureteral calculus at the L3 level (coronal image 91).  Bladder is within normal limits.  Stomach/Bowel: Stomach is within normal limits.  No evidence of bowel obstruction.  Normal appendix (series 2/image 62).  Vascular/Lymphatic: No evidence of abdominal aortic aneurysm.  No suspicious abdominopelvic lymphadenopathy.  Reproductive: Prostate is unremarkable.  Other: No abdominopelvic ascites.  Musculoskeletal: Degenerative changes of the visualized thoracolumbar spine.  IMPRESSION: 14 mm proximal left ureteral calculus at the  L3 level. Associated moderate left hydroureteronephrosis.  Additional bilateral nonobstructing renal calculi measuring up to 6 mm on the left.  Bilateral renal cysts, measuring to 3.8 cm on the right.   Electronically Signed By: Julian Hy M.D. On: 07/19/2021 22:58    Assessment & Plan:    1.  Left proximal ureteral calculus Moderate left hydronephrosis secondary to obstructing calculus We discussed various treatment options for urolithiasis including observation with or without medical expulsive therapy, shockwave lithotripsy (SWL), ureteroscopy and laser lithotripsy with stent placement, and percutaneous nephrolithotomy. We discussed that management is based on stone size, location, density, patient co-morbidities, and patient preference.  Stones <40m in size have a >80% spontaneous passage rate. Data surrounding the use of tamsulosin for medical expulsive therapy is controversial, but meta analyses suggests  it is most efficacious for distal stones between 5-157min size. SWL has a lower stone free rate in a single procedure, but also a lower complication rate compared to ureteroscopy and avoids a stent and associated stent related symptoms. Possible complications include renal hematoma, steinstrasse, and need for additional treatment. Ureteroscopy with laser lithotripsy and stent placement has a higher stone free rate than SWL in a single procedure, however increased complication rate including possible infection, ureteral injury, bleeding, and stent related morbidity. Common stent related symptoms include dysuria, urgency/frequency, and flank pain. After an extensive discussion of the risks and benefits of the above treatment options and the high stone density, the patient would like to proceed with ureteroscopic stone removal.   ScAbbie SonsMDHaleiwa[address]merville[place] [phone]

## 2021-08-14 ENCOUNTER — Encounter: Payer: Self-pay | Admitting: Urology

## 2021-08-15 ENCOUNTER — Other Ambulatory Visit: Payer: Self-pay | Admitting: Family

## 2021-08-15 DIAGNOSIS — F339 Major depressive disorder, recurrent, unspecified: Secondary | ICD-10-CM

## 2021-08-16 DIAGNOSIS — M19019 Primary osteoarthritis, unspecified shoulder: Secondary | ICD-10-CM | POA: Diagnosis not present

## 2021-08-16 DIAGNOSIS — M19011 Primary osteoarthritis, right shoulder: Secondary | ICD-10-CM | POA: Diagnosis not present

## 2021-08-16 DIAGNOSIS — M19012 Primary osteoarthritis, left shoulder: Secondary | ICD-10-CM | POA: Diagnosis not present

## 2021-08-17 ENCOUNTER — Telehealth: Payer: Self-pay | Admitting: Urology

## 2021-08-17 NOTE — Telephone Encounter (Signed)
LMOM FOR PT. TO RETURN MY CALL TO DISCUSS SURGERY  

## 2021-08-17 NOTE — Telephone Encounter (Signed)
-----  Message from Abbie Sons, MD sent at 08/14/2021 10:00 PM EDT ----- Regarding: Surgery scheduling Surgical Physician Order Form  #Length of Case: 60 min  #Medical Clearance if needed:  n/a  #MD to give Clearance: n/a  #Anticoagulation: Hold all anticoagulation [No]  May continue aspirin [Yes]          May continue all anticoagulants [Yes]  #Post-op visit Date/Instructions: 7-10 days        KUB prior _0     RUS prior   _1     Voiding trial  _2     Cysto stent removal  _3    Follow up with: MD   #Diagnosis: Left ureteral calculus  #Procedure: Left ureteroscopy/laser lithotripsy/stone removal/stent placement  Admit type: Outpatient  Yes  Observation No   Inpatient No    Number of Days: ___   Anesthesia: general   Labs:  Per Anesthesia  [YES]    UA&Urine Culture [Yes]  CBC  _4    B MET   _5    PT/INR   _6   aPTT  _7            Urine Pregnancy _8    Urine Drug Screen _9     Type & Screen  _10   Crossmatch: ____ units  Other: ________   Tests:  EKG/Chest x-ray per Anesthesia [YES]         Chest X-ray  _11   EKG  _12   KUB day of procedure  _13    Medications:   Ancef 1g IV  _14       Ancef 2g IV  _15   Ciprofloxacin 47m IV  _16   Vancomycin 1g IV  _17   Ceftriaxone (Rocephin) 1g IV  _18     Clindamycin 6064mIV  _19   Clindamycin 90015mV  _20   Ampicillin 1g IV  _21   Keflex 500m22m  _22   Gentamicin __ mg IV or per pharmacy _23   Gemcitabine 2000mg11mdder instillation _24      Diflucan 100mg 48m_25    Botox injection _26  units ____  Magnesium citrate 1 bottle PO - when?___     Fleets enema - when?__  Other:__________     VTE Prophylaxis:     SCD's  _27       TED Hose  _28     Heparin 5000 units sq  _29         Other: __________

## 2021-08-18 ENCOUNTER — Other Ambulatory Visit: Payer: Self-pay

## 2021-08-18 ENCOUNTER — Telehealth: Payer: Self-pay | Admitting: Urology

## 2021-08-18 DIAGNOSIS — N201 Calculus of ureter: Secondary | ICD-10-CM

## 2021-08-18 NOTE — Telephone Encounter (Signed)
Per Dr. Bernardo Heater Patient is to be scheduled for Left Ureteroscopy,Laser Lithotripsy,Stone Removal,Ureteral Stent Placement   Larry Roman was contacted and possible surgical dates were discussed, 08/24/21  was agreed upon for surgery.   Patient was directed to call [phone] between 1-3pm the day before surgery to find out surgical arrival time.  Instructions were given not to eat or drink from midnight on the night before surgery and have a driver for the day of surgery. On the surgery day patient was instructed to enter through the [place] entrance of [facility] report the Same Day Surgery desk.   Pre-Admit Testing will be in contact via phone to set up an interview with the anesthesia team to review your history and medications prior to surgery.   Reminder of this information was sent via mychart to the patient.   Patient is not to hold anticoag's per Dr. Bernardo Heater. May continue Anticoagulants and ASA.

## 2021-08-18 NOTE — Progress Notes (Signed)
[facility] Posting Form   Surgery Date/Time: Date: 08/24/2021  Surgeon: Dr. John Giovanni   Surgery Location: Day Surgery  Inpt ( No  )   Outpt (Yes)   Obs ( No  )   Diagnosis: N20.1 Left ureteral stone  -CPT: 52356  Surgery: Left ureteroscopy/laser lithotripsy/stone removal/stent placement  Stop Anticoagulations: No  Cardiac/Medical/Pulmonary Clearance needed: No Clearance needed   *Orders entered into EPIC  Date: 08/18/21   *Case booked in EPIC  Date: 08/18/21  *Notified pt of Surgery: Date: 08/18/21  PRE-OP UA & CX: Yes, obtained in clinic 08/13/2021  *Placed into Prior Authorization Work Fabio Bering Date: 08/18/21   Assistant/laser/rep:No

## 2021-08-19 ENCOUNTER — Telehealth: Payer: Self-pay | Admitting: Urology

## 2021-08-19 ENCOUNTER — Other Ambulatory Visit: Payer: Self-pay | Admitting: Family

## 2021-08-19 DIAGNOSIS — I10 Essential (primary) hypertension: Secondary | ICD-10-CM

## 2021-08-19 LAB — CULTURE, URINE COMPREHENSIVE

## 2021-08-19 MED ORDER — SULFAMETHOXAZOLE-TRIMETHOPRIM 800-160 MG PO TABS: 1.0000 | ORAL_TABLET | Freq: Two times a day (BID) | ORAL | 0 refills | Status: AC

## 2021-08-19 NOTE — Telephone Encounter (Signed)
Spoke with pt. Advised of urine culture results. Advised medication has been sent in and should be started tomorrow. Patient agreed and verbalized understanding.

## 2021-08-19 NOTE — Telephone Encounter (Signed)
Urine culture + for bacteria.  Antibiotic Rx was sent to his pharmacy.  Please have him start taking Friday, 08/20/2021

## 2021-08-20 ENCOUNTER — Other Ambulatory Visit: Payer: Self-pay

## 2021-08-20 ENCOUNTER — Encounter
Admission: RE | Admit: 2021-08-20 | Discharge: 2021-08-20 | Disposition: A | Discharge status: Home / Self Care | Payer: PPO | Source: Ambulatory Visit | Attending: Urology | Admitting: Urology

## 2021-08-20 HISTORY — DX: Personal history of urinary calculi: Z87.442

## 2021-08-20 HISTORY — DX: Unspecified osteoarthritis, unspecified site: M19.90

## 2021-08-20 HISTORY — DX: Gastro-esophageal reflux disease without esophagitis: K21.9

## 2021-08-20 NOTE — Patient Instructions (Signed)
Your procedure is scheduled on:08-24-21 Tuesday Report to the Registration Desk on the 1st floor of the [facility].Then proceed to the 2nd floor Surgery Desk in the [facility] To find out your arrival time, please call [phone] between 1PM - 3PM on:08-23-21 Monday  REMEMBER: Instructions that are not followed completely may result in serious medical risk, up to and including death; or upon the discretion of your surgeon and anesthesiologist your surgery may need to be rescheduled.  Do not eat food OR drink liquids after midnight the night before surgery.  No gum chewing, lozengers or hard candies.  TAKE THESE MEDICATIONS THE MORNING OF SURGERY WITH A SIP OF WATER: -Amlodipine (Norvasc) -Duloxetine (Cymbalta) -Loratadine (Claritin) -Metoprolol (Toprol XL) -Tamsulosin (Flomax)  Stop your 81 mg Aspirin NOW (08-20-21)  Stop Metformin 2 days prior to surgery-Last dose on 08-21-21 Saturday  One week prior to surgery: Stop Anti-inflammatories (NSAIDS) such as Advil, Aleve, Ibuprofen, Motrin, Naproxen, Naprosyn and Aspirin based products such as Excedrin, Goodys Powder, BC Powder.You may however, continue to take Tylenol if needed for pain up until the day of surgery.  Stop ANY OVER THE COUNTER supplements/vitamins NOW (08-20-21) until after surgery (Vitamin B12)  No Alcohol for 24 hours before or after surgery.  No Smoking including e-cigarettes for 24 hours prior to surgery.  No chewable tobacco products for at least 6 hours prior to surgery.  No nicotine patches on the day of surgery.  Do not use any "recreational" drugs for at least a week prior to your surgery.  Please be advised that the combination of cocaine and anesthesia may have negative outcomes, up to and including death. If you test positive for cocaine, your surgery will be cancelled.  On the morning of surgery brush your teeth with toothpaste and water, you may rinse your mouth with mouthwash if you wish. Do not  swallow any toothpaste or mouthwash.  Do not wear jewelry, make-up, hairpins, clips or nail polish.  Do not wear lotions, powders, or perfumes.   Do not shave body from the neck down 48 hours prior to surgery just in case you cut yourself which could leave a site for infection.  Also, freshly shaved skin may become irritated if using the CHG soap.  Contact lenses, hearing aids and dentures may not be worn into surgery.  Do not bring valuables to the hospital. [facility] is not responsible for any missing/lost belongings or valuables.   Notify your doctor if there is any change in your medical condition (cold, fever, infection).  Wear comfortable clothing (specific to your surgery type) to the hospital.  After surgery, you can help prevent lung complications by doing breathing exercises.  Take deep breaths and cough every 1-2 hours. Your doctor may order a device called an Incentive Spirometer to help you take deep breaths. When coughing or sneezing, hold a pillow firmly against your incision with both hands. This is called "splinting." Doing this helps protect your incision. It also decreases belly discomfort.  If you are being admitted to the hospital overnight, leave your suitcase in the car. After surgery it may be brought to your room.  If you are being discharged the day of surgery, you will not be allowed to drive home. You will need a responsible adult (18 years or older) to drive you home and stay with you that night.   If you are taking public transportation, you will need to have a responsible adult (18 years or older) with you. Please  confirm with your physician that it is acceptable to use public transportation.   Please call the [facility] Dept. at [phone] if you have any questions about these instructions.  Surgery Visitation Policy:  Patients undergoing a surgery or procedure may have one family member or support person with them as long as that  person is not COVID-19 positive or experiencing its symptoms.  That person may remain in the waiting area during the procedure.  Inpatient Visitation:    Visiting hours are 7 a.m. to 8 p.m. Inpatients will be allowed two visitors daily. The visitors may change each day during the patient's stay. No visitors under the age of 73. Any visitor under the age of 75 must be accompanied by an adult. The visitor must pass COVID-19 screenings, use hand sanitizer when entering and exiting the patient's room and wear a mask at all times, including in the patient's room. Patients must also wear a mask when staff or their visitor are in the room. Masking is required regardless of vaccination status.

## 2021-08-23 ENCOUNTER — Encounter
Admission: RE | Admit: 2021-08-23 | Discharge: 2021-08-23 | Disposition: A | Discharge status: Home / Self Care | Payer: PPO | Source: Ambulatory Visit | Attending: Urology | Admitting: Urology

## 2021-08-23 ENCOUNTER — Other Ambulatory Visit: Payer: Self-pay

## 2021-08-23 DIAGNOSIS — I10 Essential (primary) hypertension: Secondary | ICD-10-CM | POA: Insufficient documentation

## 2021-08-23 DIAGNOSIS — Z01818 Encounter for other preprocedural examination: Secondary | ICD-10-CM | POA: Diagnosis not present

## 2021-08-23 DIAGNOSIS — E118 Type 2 diabetes mellitus with unspecified complications: Secondary | ICD-10-CM | POA: Diagnosis not present

## 2021-08-23 DIAGNOSIS — E119 Type 2 diabetes mellitus without complications: Secondary | ICD-10-CM | POA: Diagnosis not present

## 2021-08-23 LAB — BASIC METABOLIC PANEL
Anion gap: 7 (ref 5–15)
BUN: 31 mg/dL — ABNORMAL HIGH (ref 8–23)
CO2: 27 mmol/L (ref 22–32)
Calcium: 9.7 mg/dL (ref 8.9–10.3)
Chloride: 103 mmol/L (ref 98–111)
Creatinine, Ser: 1.22 mg/dL (ref 0.61–1.24)
GFR, Estimated: >60 mL/min (ref >60)
Glucose, Bld: 124 mg/dL — ABNORMAL HIGH (ref 70–99)
Potassium: 4.4 mmol/L (ref 3.5–5.1)
Sodium: 137 mmol/L (ref 135–145)

## 2021-08-23 LAB — CBC
HCT: 39.8 % (ref 39.0–52.0)
Hemoglobin: 13.7 g/dL (ref 13.0–17.0)
MCH: 32.3 pg (ref 26.0–34.0)
MCHC: 34.4 g/dL (ref 30.0–36.0)
MCV: 93.9 fL (ref 80.0–100.0)
Platelets: 300 10*3/uL (ref 150–400)
RBC: 4.24 MIL/uL (ref 4.22–5.81)
RDW: 12.2 % (ref 11.5–15.5)
WBC: 8.1 10*3/uL (ref 4.0–10.5)
nRBC: 0 % (ref 0.0–0.2)

## 2021-08-24 ENCOUNTER — Encounter: Payer: Self-pay | Admitting: Urology

## 2021-08-24 ENCOUNTER — Ambulatory Visit: Payer: PPO

## 2021-08-24 ENCOUNTER — Ambulatory Visit: Payer: PPO | Admitting: Urgent Care

## 2021-08-24 ENCOUNTER — Other Ambulatory Visit: Payer: Self-pay | Admitting: *Deleted

## 2021-08-24 ENCOUNTER — Other Ambulatory Visit: Payer: Self-pay

## 2021-08-24 ENCOUNTER — Encounter
Admission: RE | Disposition: A | Discharge status: Home / Self Care | Payer: Self-pay | Source: Home / Self Care | Attending: Urology

## 2021-08-24 ENCOUNTER — Ambulatory Visit
Admission: RE | Admit: 2021-08-24 | Discharge: 2021-08-24 | Disposition: A | Discharge status: Home / Self Care | Payer: PPO | Attending: Urology | Admitting: Urology

## 2021-08-24 DIAGNOSIS — Z79899 Other long term (current) drug therapy: Secondary | ICD-10-CM | POA: Insufficient documentation

## 2021-08-24 DIAGNOSIS — Z886 Allergy status to analgesic agent status: Secondary | ICD-10-CM | POA: Diagnosis not present

## 2021-08-24 DIAGNOSIS — E1136 Type 2 diabetes mellitus with diabetic cataract: Secondary | ICD-10-CM | POA: Insufficient documentation

## 2021-08-24 DIAGNOSIS — N132 Hydronephrosis with renal and ureteral calculous obstruction: Secondary | ICD-10-CM | POA: Insufficient documentation

## 2021-08-24 DIAGNOSIS — M1990 Unspecified osteoarthritis, unspecified site: Secondary | ICD-10-CM | POA: Diagnosis not present

## 2021-08-24 DIAGNOSIS — Z8249 Family history of ischemic heart disease and other diseases of the circulatory system: Secondary | ICD-10-CM | POA: Insufficient documentation

## 2021-08-24 DIAGNOSIS — Z7984 Long term (current) use of oral hypoglycemic drugs: Secondary | ICD-10-CM | POA: Diagnosis not present

## 2021-08-24 DIAGNOSIS — G4730 Sleep apnea, unspecified: Secondary | ICD-10-CM | POA: Diagnosis not present

## 2021-08-24 DIAGNOSIS — N201 Calculus of ureter: Secondary | ICD-10-CM

## 2021-08-24 DIAGNOSIS — Z7982 Long term (current) use of aspirin: Secondary | ICD-10-CM | POA: Diagnosis not present

## 2021-08-24 DIAGNOSIS — I10 Essential (primary) hypertension: Secondary | ICD-10-CM | POA: Diagnosis not present

## 2021-08-24 DIAGNOSIS — N202 Calculus of kidney with calculus of ureter: Secondary | ICD-10-CM | POA: Diagnosis not present

## 2021-08-24 DIAGNOSIS — N1330 Unspecified hydronephrosis: Secondary | ICD-10-CM | POA: Diagnosis not present

## 2021-08-24 HISTORY — PX: CYSTOSCOPY/URETEROSCOPY/HOLMIUM LASER/STENT PLACEMENT: SHX6546

## 2021-08-24 LAB — GLUCOSE, CAPILLARY
Glucose-Capillary: 132 mg/dL — ABNORMAL HIGH (ref 70–99)
Glucose-Capillary: 167 mg/dL — ABNORMAL HIGH (ref 70–99)

## 2021-08-24 SURGERY — CYSTOSCOPY/URETEROSCOPY/HOLMIUM LASER/STENT PLACEMENT
Anesthesia: General | Laterality: Left

## 2021-08-24 MED ORDER — ROCURONIUM BROMIDE 100 MG/10ML IV SOLN
INTRAVENOUS | Status: DC | PRN
  Administered 2021-08-24: 50 mg via INTRAVENOUS
  Administered 2021-08-24 (×2): 10 mg via INTRAVENOUS

## 2021-08-24 MED ORDER — 0.9 % SODIUM CHLORIDE (POUR BTL) OPTIME
TOPICAL | Status: DC | PRN
  Administered 2021-08-24: 250 mL

## 2021-08-24 MED ORDER — PROPOFOL 10 MG/ML IV BOLUS
INTRAVENOUS | Status: AC
  Filled 2021-08-24: qty 20

## 2021-08-24 MED ORDER — DEXAMETHASONE SODIUM PHOSPHATE 10 MG/ML IJ SOLN
INTRAMUSCULAR | Status: DC | PRN
  Administered 2021-08-24: 10 mg via INTRAVENOUS

## 2021-08-24 MED ORDER — PROPOFOL 10 MG/ML IV BOLUS
INTRAVENOUS | Status: DC | PRN
  Administered 2021-08-24: 50 mg via INTRAVENOUS
  Administered 2021-08-24: 150 mg via INTRAVENOUS

## 2021-08-24 MED ORDER — LACTATED RINGERS IV SOLN: INTRAVENOUS | Status: DC | PRN

## 2021-08-24 MED ORDER — ROCURONIUM BROMIDE 10 MG/ML (PF) SYRINGE
PREFILLED_SYRINGE | INTRAVENOUS | Status: AC
  Filled 2021-08-24: qty 10

## 2021-08-24 MED ORDER — DEXAMETHASONE SODIUM PHOSPHATE 10 MG/ML IJ SOLN
INTRAMUSCULAR | Status: AC
  Filled 2021-08-24: qty 1

## 2021-08-24 MED ORDER — ORAL CARE MOUTH RINSE: 15.0000 mL | Freq: Once | OROMUCOSAL | Status: AC

## 2021-08-24 MED ORDER — ONDANSETRON HCL 4 MG/2ML IJ SOLN
INTRAMUSCULAR | Status: DC | PRN
  Administered 2021-08-24: 4 mg via INTRAVENOUS

## 2021-08-24 MED ORDER — ONDANSETRON HCL 4 MG/2ML IJ SOLN
INTRAMUSCULAR | Status: AC
  Filled 2021-08-24: qty 2

## 2021-08-24 MED ORDER — PHENYLEPHRINE HCL (PRESSORS) 10 MG/ML IV SOLN
INTRAVENOUS | Status: AC
  Filled 2021-08-24: qty 1

## 2021-08-24 MED ORDER — CHLORHEXIDINE GLUCONATE 0.12 % MT SOLN
OROMUCOSAL | Status: AC
  Administered 2021-08-24: 15 mL via OROMUCOSAL
  Filled 2021-08-24: qty 15

## 2021-08-24 MED ORDER — IOHEXOL 180 MG/ML  SOLN
INTRAMUSCULAR | Status: DC | PRN
  Administered 2021-08-24: 10 mL

## 2021-08-24 MED ORDER — FENTANYL CITRATE (PF) 100 MCG/2ML IJ SOLN
INTRAMUSCULAR | Status: AC
  Filled 2021-08-24: qty 2

## 2021-08-24 MED ORDER — EPHEDRINE SULFATE 50 MG/ML IJ SOLN
INTRAMUSCULAR | Status: DC | PRN
  Administered 2021-08-24: 10 mg via INTRAVENOUS

## 2021-08-24 MED ORDER — SODIUM CHLORIDE 0.9 % IV SOLN: INTRAVENOUS | Status: DC

## 2021-08-24 MED ORDER — FENTANYL CITRATE (PF) 100 MCG/2ML IJ SOLN
INTRAMUSCULAR | Status: DC | PRN
  Administered 2021-08-24: 100 ug via INTRAVENOUS

## 2021-08-24 MED ORDER — OXYBUTYNIN CHLORIDE 5 MG PO TABS: ORAL_TABLET | ORAL | 0 refills | Status: DC

## 2021-08-24 MED ORDER — SUGAMMADEX SODIUM 200 MG/2ML IV SOLN
INTRAVENOUS | Status: DC | PRN
  Administered 2021-08-24: 200 mg via INTRAVENOUS

## 2021-08-24 MED ORDER — LIDOCAINE HCL (CARDIAC) PF 100 MG/5ML IV SOSY
PREFILLED_SYRINGE | INTRAVENOUS | Status: DC | PRN
  Administered 2021-08-24: 100 mg via INTRAVENOUS

## 2021-08-24 MED ORDER — GENTAMICIN SULFATE 40 MG/ML IJ SOLN
5.0000 mg/kg | INTRAVENOUS | Status: AC
  Administered 2021-08-24: 490 mg via INTRAVENOUS
  Filled 2021-08-24: qty 12.25

## 2021-08-24 MED ORDER — HYDROCODONE-ACETAMINOPHEN 5-325 MG PO TABS: 1.0000 | ORAL_TABLET | Freq: Four times a day (QID) | ORAL | 0 refills | Status: DC | PRN

## 2021-08-24 MED ORDER — LIDOCAINE HCL (PF) 2 % IJ SOLN
INTRAMUSCULAR | Status: AC
  Filled 2021-08-24: qty 5

## 2021-08-24 MED ORDER — CHLORHEXIDINE GLUCONATE 0.12 % MT SOLN: 15.0000 mL | Freq: Once | OROMUCOSAL | Status: AC

## 2021-08-24 SURGICAL SUPPLY — 32 items
BAG DRAIN CYSTO-URO LG1000N (MISCELLANEOUS) ×2 IMPLANT
BASKET ZERO TIP 1.9FR (BASKET) ×2 IMPLANT
BRUSH SCRUB EZ 1% IODOPHOR (MISCELLANEOUS) ×2 IMPLANT
BSKT STON RTRVL ZERO TP 1.9FR (BASKET) ×1
CATH URET FLEX-TIP 2 LUMEN 10F (CATHETERS) IMPLANT
CATH URETL OPEN 5X70 (CATHETERS) IMPLANT
CNTNR SPEC 2.5X3XGRAD LEK (MISCELLANEOUS)
CONT SPEC 4OZ STER OR WHT (MISCELLANEOUS)
CONT SPEC 4OZ STRL OR WHT (MISCELLANEOUS)
CONTAINER SPEC 2.5X3XGRAD LEK (MISCELLANEOUS) IMPLANT
DRAPE UTILITY 15X26 TOWEL STRL (DRAPES) ×2 IMPLANT
GAUZE 4X4 16PLY ~~LOC~~+RFID DBL (SPONGE) ×4 IMPLANT
GLOVE SURG UNDER POLY LF SZ7.5 (GLOVE) ×2 IMPLANT
GOWN STRL REUS W/ TWL LRG LVL3 (GOWN DISPOSABLE) ×1 IMPLANT
GOWN STRL REUS W/ TWL XL LVL3 (GOWN DISPOSABLE) ×1 IMPLANT
GOWN STRL REUS W/TWL LRG LVL3 (GOWN DISPOSABLE) ×2
GOWN STRL REUS W/TWL XL LVL3 (GOWN DISPOSABLE) ×2
GUIDEWIRE STR DUAL SENSOR (WIRE) ×4 IMPLANT
INFUSOR MANOMETER BAG 3000ML (MISCELLANEOUS) ×2 IMPLANT
IV NS IRRIG 3000ML ARTHROMATIC (IV SOLUTION) ×2 IMPLANT
KIT TURNOVER CYSTO (KITS) ×2 IMPLANT
PACK CYSTO AR (MISCELLANEOUS) ×2 IMPLANT
SET CYSTO W/LG BORE CLAMP LF (SET/KITS/TRAYS/PACK) ×2 IMPLANT
SHEATH URETERAL 12FR 45CM (SHEATH) ×2 IMPLANT
SHEATH URETERAL 12FRX35CM (MISCELLANEOUS) IMPLANT
STENT URET 6FRX24 CONTOUR (STENTS) ×2 IMPLANT
STENT URET 6FRX26 CONTOUR (STENTS) IMPLANT
SURGILUBE 2OZ TUBE FLIPTOP (MISCELLANEOUS) ×2 IMPLANT
TRACTIP FLEXIVA PULSE ID 200 (Laser) ×2 IMPLANT
VALVE UROSEAL ADJ ENDO (VALVE) ×2 IMPLANT
WATER STERILE IRR 1000ML POUR (IV SOLUTION) ×2 IMPLANT
WATER STERILE IRR 500ML POUR (IV SOLUTION) ×2 IMPLANT

## 2021-08-24 NOTE — Progress Notes (Signed)
PHARMACY -  BRIEF ANTIBIOTIC NOTE   Pharmacy has received consult(s) for Gentamicin from an OR provider.  The patient's profile has been reviewed for ht/wt/allergies/indication/available labs.    One time order(s) placed for 490 mg (~ 5 mg/kg) per pt wt: 97.5 kg.  Further antibiotics/pharmacy consults should be ordered by admitting physician if indicated.                       Thank you, Renda Rolls, PharmD, [facility] 08/24/2021 6:52 AM

## 2021-08-24 NOTE — Interval H&P Note (Signed)
History and Physical Interval Note:  08/24/2021 7:19 AM  Larry Roman.  has presented today for surgery, with the diagnosis of Left ureteral Calculi.  The various methods of treatment have been discussed with the patient and family. After consideration of risks, benefits and other options for treatment, the patient has consented to  Procedure(s): CYSTOSCOPY/URETEROSCOPY/HOLMIUM LASER/STENT PLACEMENT (Left) as a surgical intervention.  The patient's history has been reviewed, patient examined, no change in status, stable for surgery.  I have reviewed the patient's chart and labs.  Questions were answered to the patient's satisfaction.     [place]

## 2021-08-24 NOTE — Op Note (Signed)
Preoperative diagnosis:  Left proximal ureteral calculus  Left nephrolithiasis   Postoperative diagnosis:  Same  Procedure:  Cystoscopy Left ureteroscopy and stone removal Ureteroscopic laser lithotripsy Left ureteral stent placement (67F/24 cm)  Left retrograde pyelography with interpretation  Surgeon: Nicki Reaper C. Aldean Suddeth, M.D.  Anesthesia: General  Complications: None  Intraoperative findings:  Cystoscopy: Urethra normal in caliber without stricture.  Prostate with moderate lateral lobe enlargement and mild bladder neck elevation; small median lobe.  Bladder mucosa without erythema, solid or papillary lesions.  UOs normal-appearing.  Mild trabeculation Left ureteroscopy: Mucosa distal and mid ureter normal.  Left proximal ureteral calculus embedded into the ureteral mucosa medially Pyeloscopy: Moderate hydronephrosis.  Nonobstructing 5 mm left lower pole calculus Left retrograde pyelogram post procedure showed no contrast extravasation and no significant sized calculi  EBL: Minimal  Specimens: Calculus fragments for analysis   Indication: Larry Roman. is a 75 y.o. incidentally found to have moderate left hydronephrosis on renal ultrasound ordered by nephrology for proteinuria.  CT urogram remarkable for moderate left hydronephrosis and hydroureter to a 14 mm left proximal ureteral calculus.  Bilateral, nonobstructing renal calculi present with a 6 mm left lower pole calculus.  He is asymptomatic.  After reviewing the management options for treatment, the patient elected to proceed with the above surgical procedure(s). We have discussed the potential benefits and risks of the procedure, side effects of the proposed treatment, the likelihood of the patient achieving the goals of the procedure, and any potential problems that might occur during the procedure or recuperation. Informed consent has been obtained.  Description of procedure:  The patient was taken to the operating  room and general anesthesia was induced.  The patient was placed in the dorsal lithotomy position, prepped and draped in the usual sterile fashion, and preoperative antibiotics were administered. A preoperative time-out was performed.   A 21 French cystoscope was lubricated, inserted into the urethra passed proximally under direct vision with findings as described above.   Attention was directed to the left ureteral orifice and a 0.038 Sensor wire was then advanced up the  ureter into the renal pelvis under fluoroscopic guidance.  The cystoscope was removed and a 4.5 French semirigid ureteroscope was advanced per urethra under direct vision.  The ureteral orifice was easily engaged without dilation and advanced proximally however the scope could only be advanced just distal to the calculus.  The semirigid scope ureteroscope was removed and a single channel digital flexible ureteroscope was advanced per urethra.  The scope was placed alongside the guidewire and advanced proximally with findings as described above.  A 242 m holmium laser fiber was placed through the ureteroscope and the stone was dusted at a setting of 0.3 J and frequency of 60 hz.   Once completely dusted the ureteroscope was advanced into the renal pelvis.  The lower pole fragment was easily visualized.  There were a few larger fragments settled in the dependent portion of the renal pelvis.  It was elected to place a ureteral access sheath at this point.  A second guidewire was placed through the ureteroscope and ureteroscope was removed.  A 12/14 French ureteral access sheath was then advanced over the guidewire under fluoroscopic guidance without difficulty.  The inner stylette and working wire were removed.  The ureteroscope was advanced through the access sheath and the renal pelvis.  The 5 mm lower pole calculus was able to be basketed and removed without fragmentation.  3 additional fragments larger than 1 mm  were removed via the  basket.  Retrograde pyelogram was then performed and all calyces were examined.  No fragments larger than the tip of the laser fiber were identified.  The ureteral access sheath and ureteroscope were removed in tandem and the ureter showed no evidence of injury or perforation.  A 6 FR/24 CM Contour ureteral stent was placed under fluoroscopic guidance.  The wire was then removed with an adequate stent curl noted in the renal pelvis as well as in the bladder.  The bladder was then emptied and the procedure ended.  The patient appeared to tolerate the procedure well and without complications.  After anesthetic reversal the patient was transported to the PACU in stable condition.   Plan: 2 week office follow-up for stent removal with KUB prior   John Giovanni, MD

## 2021-08-24 NOTE — Transfer of Care (Signed)
Immediate Anesthesia Transfer of Care Note  Patient: Larry Roman.  Procedure(s) Performed: CYSTOSCOPY/URETEROSCOPY/HOLMIUM LASER/STENT PLACEMENT (Left)  Patient Location: PACU  Anesthesia Type:General  Level of Consciousness: awake, alert  and oriented  Airway & Oxygen Therapy: Patient Spontanous Breathing and Patient connected to face mask oxygen  Post-op Assessment: Report given to RN and Post -op Vital signs reviewed and stable  Post vital signs: Reviewed and stable  Last Vitals:  Vitals Value Taken Time  BP    Temp    Pulse    Resp    SpO2      Last Pain:  Vitals:   08/24/21 0649  TempSrc: Oral  PainSc: 0-No pain         Complications: No notable events documented.

## 2021-08-24 NOTE — Anesthesia Procedure Notes (Signed)
Procedure Name: Intubation Date/Time: 08/24/2021 7:34 AM Performed by: Rona Ravens, CRNA Pre-anesthesia Checklist: Patient identified, Patient being monitored, Timeout performed, Emergency Drugs available and Suction available Patient Re-evaluated:Patient Re-evaluated prior to induction Oxygen Delivery Method: Circle system utilized Preoxygenation: Pre-oxygenation with 100% oxygen Induction Type: IV induction Ventilation: Mask ventilation without difficulty Laryngoscope Size: Mac and 4 Grade View: Grade III Tube type: Oral Tube size: 7.0 mm Number of attempts: 1 Airway Equipment and Method: Stylet Placement Confirmation: ETT inserted through vocal cords under direct vision, positive ETCO2 and breath sounds checked- equal and bilateral Secured at: 22 cm Tube secured with: Tape Dental Injury: Teeth and Oropharynx as per pre-operative assessment

## 2021-08-24 NOTE — Discharge Instructions (Addendum)
AMBULATORY SURGERY  DISCHARGE INSTRUCTIONS   The drugs that you were given will stay in your system until tomorrow so for the next 24 hours you should not:  Drive an automobile Make any legal decisions Drink any alcoholic beverage   You may resume regular meals tomorrow.  Today it is better to start with liquids and gradually work up to solid foods.  You may eat anything you prefer, but it is better to start with liquids, then soup and crackers, and gradually work up to solid foods.   Please notify your doctor immediately if you have any unusual bleeding, trouble breathing, redness and pain at the surgery site, drainage, fever, or pain not relieved by medication.    Additional Instructions:   DISCHARGE INSTRUCTIONS FOR KIDNEY STONE/URETERAL STENT   MEDICATIONS:  1. Resume all your other meds from home.  2.  AZO (over-the-counter) can help with the burning/stinging when you urinate. 3.  Hydrocodone is for moderate/severe pain, Rx was sent to your pharmacy. 4.  Rx oxybutynin sent to pharmacy for bladder discomfort/spasm  ACTIVITY:  1. May resume regular activities in 24 hours. 2. No driving while on narcotic pain medications  3. Drink plenty of water  4. Continue to walk at home - you can still get blood clots when you are at home, so keep active, but don't over do it.  5. May return to work/school tomorrow or when you feel ready     SIGNS/SYMPTOMS TO CALL:  Common postoperative symptoms include urinary frequency, urgency, bladder spasm and blood in the urine  Please call us if you have a fever greater than 101.5, uncontrolled nausea/vomiting, uncontrolled pain, dizziness, unable to urinate, excessively bloody urine, chest pain, shortness of breath, leg swelling, leg pain, or any other concerns or questions.   You can reach [place] at [phone].   FOLLOW-UP:  1. You we will be contacted by our office for an appointment for stent removal in approximately 2  weeks.     Please contact your physician with any problems or Same Day Surgery at [phone], Monday through Friday 6 am to 4 pm, or [place] at [facility] number at [phone].

## 2021-08-24 NOTE — Anesthesia Preprocedure Evaluation (Signed)
Anesthesia Evaluation  Patient identified by MRN, date of birth, ID band Patient awake    Reviewed: Allergy & Precautions, NPO status , Patient's Chart, lab work & pertinent test results  History of Anesthesia Complications Negative for: history of anesthetic complications  Airway Mallampati: III  TM Distance: >3 FB Neck ROM: full    Dental  (+) Chipped   Pulmonary asthma , sleep apnea ,    Pulmonary exam normal        Cardiovascular hypertension, Normal cardiovascular exam+ dysrhythmias      Neuro/Psych PSYCHIATRIC DISORDERS negative neurological ROS     GI/Hepatic Neg liver ROS, GERD  ,  Endo/Other  diabetes, Type 2  Renal/GU      Musculoskeletal  (+) Arthritis ,   Abdominal   Peds  Hematology negative hematology ROS (+)   Anesthesia Other Findings Past Medical History: No date: Arthritis No date: Depression No date: DM (diabetes mellitus) (HCC) No date: Extrinsic asthma, unspecified     Comment:  childhood No date: GERD (gastroesophageal reflux disease)     Comment:  rare No date: History of kidney stones No date: Hives of unknown origin No date: HTN (hypertension) No date: Other allergy, other than to medicinal agents No date: RBBB No date: Sleep apnea     Comment:  25 years ago mild lost weight no longer uses cpap 08/2018: Wound of right leg     Comment:  area just below knee size of quarter, reddened around               edges  Past Surgical History: 1995: BACK SURGERY     Comment:  ruptured disc encapsulated nerves 2016: CATARACT EXTRACTION, BILATERAL     Comment:  Dr. Kerman [facility] at [facility] No date: EYE SURGERY; Bilateral     Comment:  cataract extractions No date: JOINT REPLACEMENT; Right     Comment:  total knee No date: TONSILLECTOMY No date: TONSILLECTOMY 09/27/2018: TOTAL KNEE ARTHROPLASTY; Right     Comment:  Procedure: TOTAL KNEE ARTHROPLASTY;  Surgeon: Thornton Park, MD;  Location: [facility] ORS;  Service: Orthopedics;                Laterality: Right; 08/15/2019: TOTAL KNEE ARTHROPLASTY; Left     Comment:  Procedure: TOTAL KNEE ARTHROPLASTY;  Surgeon: Thornton Park, MD;  Location: [facility] ORS;  Service: Orthopedics;                Laterality: Left;  BMI    Body Mass Index: 29.15 kg/m      Reproductive/Obstetrics negative OB ROS                             Anesthesia Physical Anesthesia Plan  ASA: 3  Anesthesia Plan: General ETT   Post-op Pain Management:    Induction: Intravenous  PONV Risk Score and Plan: Ondansetron, Dexamethasone, Midazolam and Treatment may vary due to age or medical condition  Airway Management Planned: Oral ETT  Additional Equipment:   Intra-op Plan:   Post-operative Plan: Extubation in OR  Informed Consent: I have reviewed the patients History and Physical, chart, labs and discussed the procedure including the risks, benefits and alternatives for the proposed anesthesia with the patient or authorized representative who has indicated his/her understanding and acceptance.  Dental Advisory Given  Plan Discussed with: Anesthesiologist, CRNA and Surgeon  Anesthesia Plan Comments: (Patient consented for risks of anesthesia including but not limited to:  - adverse reactions to medications - damage to eyes, teeth, lips or other oral mucosa - nerve damage due to positioning  - sore throat or hoarseness - Damage to heart, brain, nerves, lungs, other parts of body or loss of life  Patient voiced understanding.)        Anesthesia Quick Evaluation

## 2021-08-25 ENCOUNTER — Encounter: Payer: Self-pay | Admitting: Urology

## 2021-08-25 NOTE — Anesthesia Postprocedure Evaluation (Signed)
Anesthesia Post Note  Patient: Kwame Ryland.  Procedure(s) Performed: CYSTOSCOPY/URETEROSCOPY/HOLMIUM LASER/STENT PLACEMENT (Left)  Patient location during evaluation: PACU Anesthesia Type: General Level of consciousness: awake and alert Pain management: pain level controlled Vital Signs Assessment: post-procedure vital signs reviewed and stable Respiratory status: spontaneous breathing, nonlabored ventilation, respiratory function stable and patient connected to nasal cannula oxygen Cardiovascular status: blood pressure returned to baseline and stable Postop Assessment: no apparent nausea or vomiting Anesthetic complications: no   No notable events documented.   Last Vitals:  Vitals:   08/24/21 1000 08/24/21 1012  BP: 122/83 130/89  Pulse: 67 62  Resp: 14 20  Temp: (!) 36.2 C (!) 36.1 C  SpO2: 97% 97%    Last Pain:  Vitals:   08/25/21 0922  TempSrc:   PainSc: 0-No pain                 Precious Haws Cheris Tweten

## 2021-08-28 ENCOUNTER — Other Ambulatory Visit: Payer: Self-pay | Admitting: Family

## 2021-08-28 DIAGNOSIS — I10 Essential (primary) hypertension: Secondary | ICD-10-CM

## 2021-08-30 LAB — CALCULI, WITH PHOTOGRAPH (CLINICAL LAB)
Calcium Oxalate Dihydrate: 20 %
Calcium Oxalate Monohydrate: 80 %
Weight Calculi: 90 mg

## 2021-09-08 ENCOUNTER — Ambulatory Visit
Admission: RE | Admit: 2021-09-08 | Discharge: 2021-09-08 | Disposition: A | Discharge status: Home / Self Care | Payer: PPO | Source: Ambulatory Visit | Attending: Urology | Admitting: Urology

## 2021-09-08 ENCOUNTER — Ambulatory Visit
Admission: RE | Admit: 2021-09-08 | Discharge: 2021-09-08 | Disposition: A | Discharge status: Home / Self Care | Payer: PPO | Attending: Urology | Admitting: Urology

## 2021-09-08 DIAGNOSIS — N201 Calculus of ureter: Secondary | ICD-10-CM | POA: Insufficient documentation

## 2021-09-08 DIAGNOSIS — Z466 Encounter for fitting and adjustment of urinary device: Secondary | ICD-10-CM | POA: Diagnosis not present

## 2021-09-08 DIAGNOSIS — Z87442 Personal history of urinary calculi: Secondary | ICD-10-CM | POA: Diagnosis not present

## 2021-09-09 ENCOUNTER — Ambulatory Visit (INDEPENDENT_AMBULATORY_CARE_PROVIDER_SITE_OTHER): Payer: PPO | Admitting: Urology

## 2021-09-09 ENCOUNTER — Other Ambulatory Visit: Payer: Self-pay

## 2021-09-09 ENCOUNTER — Encounter: Payer: Self-pay | Admitting: Urology

## 2021-09-09 ENCOUNTER — Encounter: Payer: PPO | Admitting: Urology

## 2021-09-09 VITALS — BP 154/73 | HR 71 | Ht 72.0 in | Wt 214.0 lb

## 2021-09-09 DIAGNOSIS — N200 Calculus of kidney: Secondary | ICD-10-CM

## 2021-09-09 DIAGNOSIS — N201 Calculus of ureter: Secondary | ICD-10-CM | POA: Diagnosis not present

## 2021-09-09 LAB — URINALYSIS, COMPLETE
Bilirubin, UA: NEGATIVE
Glucose, UA: NEGATIVE
Ketones, UA: NEGATIVE
Nitrite, UA: NEGATIVE
Specific Gravity, UA: 1.025 (ref 1.005–1.030)
Urobilinogen, Ur: 0.2 mg/dL (ref 0.2–1.0)
pH, UA: 6.5 (ref 5.0–7.5)

## 2021-09-09 LAB — MICROSCOPIC EXAMINATION
RBC, Urine: >30 /hpf — AB (ref 0–2)
WBC, UA: >30 /hpf — AB (ref 0–5)

## 2021-09-09 MED ORDER — LEVOFLOXACIN 500 MG PO TABS
500.0000 mg | ORAL_TABLET | Freq: Once | ORAL | Status: AC
  Administered 2021-09-09: 500 mg via ORAL

## 2021-09-09 NOTE — Progress Notes (Signed)
Indications: Patient is 75 y.o., who is s/p ureteroscopic removal of left proximal ureteral and renal calculi on 08/24/2021.  Intraoperative findings remarkable for ureteral calculus embedded into the ureteral mucosa.  The patient is presenting today for stent removal.  KUB today reviewed.  Stent in good position and no residual calcifications identified.  Stone analysis: CaOxMono/CaOxDi: 80/20  Procedure:  Flexible Cystoscopy with stent removal (39672)  Timeout was performed and the correct patient, procedure and participants were identified.    Description:  The patient was prepped and draped in the usual sterile fashion. Flexible cystosopy was performed.  The stent was visualized, grasped, and removed intact without difficulty. The patient tolerated the procedure well.  A single dose of oral antibiotics was given.  Complications:  None  Plan:  Instructed to call for fever/flank pain post stent removal Follow-up renal ultrasound 2-3 months We discussed metabolic stone evaluation however he opted for general stone prevention guidelines and was provided literature   John Giovanni, MD

## 2021-09-13 ENCOUNTER — Other Ambulatory Visit: Payer: Self-pay

## 2021-09-13 ENCOUNTER — Encounter: Payer: Self-pay | Admitting: Family

## 2021-09-13 ENCOUNTER — Ambulatory Visit (INDEPENDENT_AMBULATORY_CARE_PROVIDER_SITE_OTHER): Payer: PPO | Admitting: Family

## 2021-09-13 VITALS — BP 110/62 | HR 68 | Temp 99.0°F | Ht 72.0 in | Wt 209.8 lb

## 2021-09-13 DIAGNOSIS — I10 Essential (primary) hypertension: Secondary | ICD-10-CM | POA: Diagnosis not present

## 2021-09-13 DIAGNOSIS — E119 Type 2 diabetes mellitus without complications: Secondary | ICD-10-CM | POA: Diagnosis not present

## 2021-09-13 DIAGNOSIS — F339 Major depressive disorder, recurrent, unspecified: Secondary | ICD-10-CM

## 2021-09-13 DIAGNOSIS — R809 Proteinuria, unspecified: Secondary | ICD-10-CM | POA: Diagnosis not present

## 2021-09-13 DIAGNOSIS — Z23 Encounter for immunization: Secondary | ICD-10-CM | POA: Diagnosis not present

## 2021-09-13 DIAGNOSIS — E782 Mixed hyperlipidemia: Secondary | ICD-10-CM

## 2021-09-13 LAB — POCT GLYCOSYLATED HEMOGLOBIN (HGB A1C): Hemoglobin A1C: 6.1 % — AB (ref 4.0–5.6)

## 2021-09-13 LAB — BASIC METABOLIC PANEL
BUN: 20 mg/dL (ref 6–23)
CO2: 27 mEq/L (ref 19–32)
Calcium: 10.3 mg/dL (ref 8.4–10.5)
Chloride: 101 mEq/L (ref 96–112)
Creatinine, Ser: 0.87 mg/dL (ref 0.40–1.50)
GFR: 85.19 mL/min (ref >60.00)
Glucose, Bld: 116 mg/dL — ABNORMAL HIGH (ref 70–99)
Potassium: 3.9 mEq/L (ref 3.5–5.1)
Sodium: 138 mEq/L (ref 135–145)

## 2021-09-13 LAB — LIPID PANEL
Cholesterol: 119 mg/dL (ref 0–200)
HDL: 52.3 mg/dL (ref >39.00)
LDL Cholesterol: 38 mg/dL (ref 0–99)
NonHDL: 66.32
Total CHOL/HDL Ratio: 2
Triglycerides: 140 mg/dL (ref 0.0–149.0)
VLDL: 28 mg/dL (ref 0.0–40.0)

## 2021-09-13 LAB — MICROALBUMIN / CREATININE URINE RATIO
Creatinine,U: 140.6 mg/dL
Microalb Creat Ratio: 4.7 mg/g (ref 0.0–30.0)
Microalb, Ur: 6.6 mg/dL — ABNORMAL HIGH (ref 0.0–1.9)

## 2021-09-13 NOTE — Assessment & Plan Note (Addendum)
Controlled. Low end normal. Asymptomatic. Suspect weight loss contributory. He declines making anit hypertensive medication changes today. Continue losartan 100mg , hctz 12.5mg  , amlodipine 10mg , toprol xl 25mg .  Plan to discontinue hctz 12.5mg  if blood pressure remains on low end, certainly if patient symptomatic.

## 2021-09-13 NOTE — Progress Notes (Signed)
Subjective:    Patient ID: Larry Ramus., male    DOB: [birth year], 75 y.o.   MRN: [id number]  CC: Larry Kriz. is a 75 y.o. male who presents today for follow up.   HPI: Feels well today.  No complaints  HLD-compliant with Pravachol 40 mg  H/o proteinuria.Had consult with Dr Lanora Manis.   DM- compliant with metformin 500mg  BID  Depression- compliant with cymbalta 60mg , trazodone 50mg . He feels depression,anxiety has improved on cymbalta 60mg .   Hypertension-compliant with losartan 100mg , hctz 12.5mg  , amlodipine 10mg , toprol xl 25mg .No cp, sob, dizziness.  recent cystoscopy stent removal 09/09/2021 with Dr. Katheren Shams   HISTORY:  Past Medical History:  Diagnosis Date   Arthritis    Depression    DM (diabetes mellitus) ([place])    Extrinsic asthma, unspecified    childhood   GERD (gastroesophageal reflux disease)    rare   History of kidney stones    Hives of unknown origin    HTN (hypertension)    Other allergy, other than to medicinal agents    RBBB    Sleep apnea    25 years ago mild lost weight no longer uses cpap   Wound of right leg 08/2018   area just below knee size of quarter, reddened around edges   Past Surgical History:  Procedure Laterality Date   BACK SURGERY  1995   ruptured disc encapsulated nerves   CATARACT EXTRACTION, BILATERAL  2016   Dr. Kerman [facility]sey at [facility]   CYSTOSCOPY/URETEROSCOPY/HOLMIUM LASER/STENT PLACEMENT Left 08/24/2021   Procedure: CYSTOSCOPY/URETEROSCOPY/HOLMIUM LASER/STENT PLACEMENT;  Surgeon: Abbie Sons, MD;  Location: [facility] ORS;  Service: Urology;  Laterality: Left;   EYE SURGERY Bilateral    cataract extractions   JOINT REPLACEMENT Right    total knee   TONSILLECTOMY     TONSILLECTOMY     TOTAL KNEE ARTHROPLASTY Right 09/27/2018   Procedure: TOTAL KNEE ARTHROPLASTY;  Surgeon: Thornton Park, MD;  Location: [facility] ORS;  Service: Orthopedics;  Laterality: Right;   TOTAL KNEE ARTHROPLASTY Left 08/15/2019    Procedure: TOTAL KNEE ARTHROPLASTY;  Surgeon: Thornton Park, MD;  Location: [facility] ORS;  Service: Orthopedics;  Laterality: Left;   Family History  Problem Relation Age of Onset   Heart failure Father    Heart attack Mother     Allergies: Ibuprofen Current Outpatient Medications on File Prior to Visit  Medication Sig Dispense Refill   ACCU-CHEK SOFTCLIX LANCETS lancets Use up to 4 times daily to check blood sugar. Diagnosis E11.9 100 each 12   acetaminophen (TYLENOL) 500 MG tablet Take 1,500 mg by mouth 2 (two) times daily.     amLODipine (NORVASC) 10 MG tablet TAKE 1 TABLET BY MOUTH DAILY WITH LUNCH. 90 tablet 1   aspirin 81 MG chewable tablet Chew 81 mg by mouth daily. Every Tuesday and Friday     azelastine (ASTELIN) 0.1 % nasal spray Place 1 spray into both nostrils 2 (two) times daily. Use in each nostril as directed (Patient taking differently: Place 1 spray into both nostrils daily as needed for rhinitis. Use in each nostril as directed) 30 mL 4   Cyanocobalamin (B-12) 5000 MCG SUBL Place 5,000 mcg under the tongue daily.      docusate sodium (COLACE) 100 MG capsule Take 100 mg by mouth daily.     DULoxetine (CYMBALTA) 30 MG capsule TAKE 1 CAPSULE (30 MG TOTAL) BY MOUTH 2 (TWO) TIMES DAILY. 180 capsule 2   fluticasone (FLONASE) 50  MCG/ACT nasal spray SPRAY 2 SPRAYS INTO EACH NOSTRIL EVERY DAY (Patient taking differently: Place 2 sprays into both nostrils daily as needed for allergies or rhinitis.) 48 mL 1   glucose blood (ONETOUCH VERIO) test strip TEST 3 TIMES A DAY 100 each 0   hydrochlorothiazide (MICROZIDE) 12.5 MG capsule TAKE 1 CAPSULE BY MOUTH EVERY DAY (Patient taking differently: Take 12.5 mg by mouth every morning.) 90 capsule 0   loratadine (CLARITIN) 10 MG tablet Take 10 mg by mouth every morning.     losartan (COZAAR) 100 MG tablet TAKE 1 TABLET BY MOUTH EVERY DAY 90 tablet 1   metFORMIN (GLUCOPHAGE) 500 MG tablet TAKE 1 TABLET BY MOUTH 2 TIMES DAILY WITH A MEAL. 180  tablet 1   metoprolol succinate (TOPROL-XL) 25 MG 24 hr tablet TAKE 1 TABLET BY MOUTH EVERY DAY (Patient taking differently: Take 25 mg by mouth every morning.) 90 tablet 0   pravastatin (PRAVACHOL) 40 MG tablet TAKE 1 TABLET BY MOUTH EVERY DAY (Patient taking differently: Take 40 mg by mouth every Monday, Wednesday, and Friday.) 90 tablet 1   tamsulosin (FLOMAX) 0.4 MG CAPS capsule TAKE 2 CAPSULES BY MOUTH EVERY DAY (Patient taking differently: Take 0.4 mg by mouth daily after breakfast.) 180 capsule 1   traZODone (DESYREL) 50 MG tablet TAKE 1 TABLET BY MOUTH AT BEDTIME AS NEEDED FOR SLEEP (Patient taking differently: Take 50 mg by mouth at bedtime.) 90 tablet 0   No current facility-administered medications on file prior to visit.    Social History   Tobacco Use   Smoking status: Never   Smokeless tobacco: Never   Tobacco comments:    tobacco use - no  Vaping Use   Vaping Use: Never used  Substance Use Topics   Alcohol use: Yes    Comment: 6   Drug use: No    Review of Systems  Constitutional:  Negative for chills and fever.  HENT:  Negative for congestion, ear pain, rhinorrhea, sinus pressure and sore throat.   Respiratory:  Negative for cough, shortness of breath and wheezing.   Cardiovascular:  Negative for chest pain and palpitations.  Gastrointestinal:  Negative for diarrhea, nausea and vomiting.  Genitourinary:  Negative for dysuria.  Musculoskeletal:  Negative for myalgias.  Skin:  Negative for rash.  Neurological:  Negative for dizziness and headaches.  Hematological:  Negative for adenopathy.  Psychiatric/Behavioral:  Negative for sleep disturbance and suicidal ideas. The patient is not nervous/anxious.      Objective:    BP 110/62 (BP Location: Left Arm, Patient Position: Sitting, Cuff Size: Large)   Pulse 68   Temp 99 F (37.2 C) (Oral)   Ht 6' (1.829 m)   Wt 209 lb 12.8 oz (95.2 kg)   SpO2 97%   BMI 28.45 kg/m  BP Readings from Last 3 Encounters:   09/13/21 110/62  09/09/21 (!) 154/73  08/24/21 130/89   Wt Readings from Last 3 Encounters:  09/13/21 209 lb 12.8 oz (95.2 kg)  09/09/21 214 lb (97.1 kg)  08/24/21 214 lb 15.2 oz (97.5 kg)    Physical Exam Vitals reviewed.  Constitutional:      Appearance: He is well-developed.  Cardiovascular:     Rate and Rhythm: Regular rhythm.     Heart sounds: Normal heart sounds.  Pulmonary:     Effort: Pulmonary effort is normal. No respiratory distress.     Breath sounds: Normal breath sounds. No wheezing, rhonchi or rales.  Skin:    General:  Skin is warm and dry.  Neurological:     Mental Status: He is alert.  Psychiatric:        Speech: Speech normal.        Behavior: Behavior normal.       Assessment & Plan:   Problem List Items Addressed This Visit       Cardiovascular and Mediastinum   Hypertension    Controlled. Low end normal. Asymptomatic. Suspect weight loss contributory. He declines making anit hypertensive medication changes today. Continue losartan 100mg , hctz 12.5mg  , amlodipine 10mg , toprol xl 25mg .  Plan to discontinue hctz 12.5mg  if blood pressure remains on low end, certainly if patient symptomatic.       Relevant Orders   Microalbumin / creatinine urine ratio     Endocrine   Diabetes mellitus type 2, controlled ([place])    Lab Results  Component Value Date   HGBA1C 6.1 (A) 09/13/2021  Excellent control.  Continue metformin 500mg  BID      Relevant Orders   POCT HgB A1C (Completed)   Lipid panel   Microalbumin / creatinine urine ratio   Basic metabolic panel     Other   Depression, recurrent (HCC)    Controlled, improved.  Continue cymbalta 60mg , trazodone 50mg       HLD (hyperlipidemia)    Chronic, anticipate controlled.  Pending lipid panel .continue Pravachol 40 mg      Proteinuria    Suspect renal stone been contributory. Cytoscopy Stent has now been removed.  Pending renal indices ( crt had increased), urine microalbumin today.  Patient  understands to continue follow-up with nephrology, urology.      Other Visit Diagnoses     Need for immunization against influenza    -  Primary   Relevant Orders   Flu Vaccine QUAD High Dose(Fluad) (Completed)        I have discontinued Larry Hurter Jr.'s HYDROcodone-acetaminophen and oxybutynin. I am also having him maintain his loratadine, B-12, Accu-Chek Softclix Lancets, glucose blood, docusate sodium, acetaminophen, azelastine, aspirin, pravastatin, DULoxetine, tamsulosin, fluticasone, traZODone, metoprolol succinate, hydrochlorothiazide, amLODipine, losartan, and metFORMIN.   No orders of the defined types were placed in this encounter.   Return precautions given.   Risks, benefits, and alternatives of the medications and treatment plan prescribed today were discussed, and patient expressed understanding.   Education regarding symptom management and diagnosis given to patient on AVS.  Continue to follow with Burnard Hawthorne, FNP for routine health maintenance.   Calvin agreed with plan.   Mable Paris, FNP

## 2021-09-13 NOTE — Patient Instructions (Addendum)
if blood pressure remains on low end,  and certainly if you develop lightheadedness, dizziness, let me know. I would advise that we discontinue the hctz 12.5mg  at that time.  Due for shingrex vaccine ( new shingles vaccine) at local pharmacy.   Nice to see you!

## 2021-09-13 NOTE — Assessment & Plan Note (Signed)
Suspect renal stone been contributory. Cytoscopy Stent has now been removed.  Pending renal indices ( crt had increased), urine microalbumin today.  Patient understands to continue follow-up with nephrology, urology.

## 2021-09-13 NOTE — Assessment & Plan Note (Signed)
Lab Results  Component Value Date   HGBA1C 6.1 (A) 09/13/2021   Excellent control.  Continue metformin 500mg  BID

## 2021-09-13 NOTE — Assessment & Plan Note (Signed)
Chronic, anticipate controlled.  Pending lipid panel .continue Pravachol 40 mg

## 2021-09-13 NOTE — Assessment & Plan Note (Signed)
Controlled, improved.  Continue cymbalta 60mg , trazodone 50mg 

## 2021-09-22 ENCOUNTER — Ambulatory Visit: Payer: Self-pay | Admitting: Urology

## 2021-10-01 DIAGNOSIS — E119 Type 2 diabetes mellitus without complications: Secondary | ICD-10-CM | POA: Diagnosis not present

## 2021-10-01 LAB — HM DIABETES EYE EXAM

## 2021-10-05 NOTE — Progress Notes (Signed)
Close  

## 2021-10-14 ENCOUNTER — Other Ambulatory Visit: Payer: Self-pay

## 2021-10-14 ENCOUNTER — Encounter: Payer: Self-pay | Admitting: Emergency Medicine

## 2021-10-14 ENCOUNTER — Ambulatory Visit
Admission: EM | Admit: 2021-10-14 | Discharge: 2021-10-14 | Disposition: A | Discharge status: Home / Self Care | Payer: PPO | Attending: Emergency Medicine | Admitting: Emergency Medicine

## 2021-10-14 DIAGNOSIS — J0100 Acute maxillary sinusitis, unspecified: Secondary | ICD-10-CM | POA: Diagnosis not present

## 2021-10-14 MED ORDER — AMOXICILLIN-POT CLAVULANATE 875-125 MG PO TABS: 1.0000 | ORAL_TABLET | Freq: Two times a day (BID) | ORAL | 0 refills | Status: DC

## 2021-10-14 MED ORDER — FLUTICASONE PROPIONATE 50 MCG/ACT NA SUSP: 1.0000 | Freq: Every day | NASAL | 0 refills | Status: DC

## 2021-10-14 NOTE — ED Triage Notes (Signed)
Pt is here with congestion and sinus pressure x 1 week.

## 2021-10-14 NOTE — ED Provider Notes (Signed)
Larry Roman    CSN: [id number] Arrival date & time: 10/14/21  1023      History   Chief Complaint Chief Complaint  Patient presents with   Nasal Congestion    HPI Larry Roman. is a 75 y.o. male.  Patient presents with 1 week history of sinus congestion, sinus pressure, postnasal drip.  His symptoms have gotten worse and he now has ear pain.  He has a mild nonproductive cough.  He denies fever, sore throat, shortness of breath, or other symptoms.  Treatment attempted at home with OTC sinus medication.  His medical history includes hypertension, right bundle branch block, diabetes, sleep apnea.  The history is provided by the patient and medical records.   Past Medical History:  Diagnosis Date   Arthritis    Depression    DM (diabetes mellitus) (Larry Roman)    Extrinsic asthma, unspecified    childhood   GERD (gastroesophageal reflux disease)    rare   History of kidney stones    Hives of unknown origin    HTN (hypertension)    Other allergy, other than to medicinal agents    RBBB    Sleep apnea    25 years ago mild lost weight no longer uses cpap   Wound of right leg 08/2018   area just below knee size of quarter, reddened around edges    Patient Active Problem List   Diagnosis Date Noted   Proteinuria 09/13/2021   Sinus congestion 12/21/2020   Encounter for long-term (current) use of aspirin 09/21/2020   HLD (hyperlipidemia) 09/21/2020   S/P TKR (total knee replacement) using cement, 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   History of gout 08/07/2019   Other fatigue 12/17/2018   Primary osteoarthritis of right knee 10/13/2018   S/P TKR (total knee replacement) using cement, right 09/27/2018   OSA (obstructive sleep apnea) 04/16/2018   Tremor 01/17/2018   Acute left-sided low back pain without sciatica 11/10/2017   Sinusitis 04/21/2017   BPH (benign prostatic hyperplasia) 01/10/2017   Chronic back pain 08/19/2016   Chronic pain of both knees 09/14/2015   Erectile  dysfunction 09/10/2012   Right bundle branch block 09/10/2012   Hypertension 11/10/2011   Insomnia 11/10/2011   Diabetes mellitus type 2, controlled (Larry Roman) 11/10/2011   Depression, recurrent (Larry Roman) 05/13/2009    Past Surgical History:  Procedure Laterality Date   BACK SURGERY  1995   ruptured disc encapsulated nerves   CATARACT EXTRACTION, BILATERAL  2016   Dr. Kerman Passey at [facility]   CYSTOSCOPY/URETEROSCOPY/HOLMIUM LASER/STENT PLACEMENT Left 08/24/2021   Procedure: CYSTOSCOPY/URETEROSCOPY/HOLMIUM LASER/STENT PLACEMENT;  Surgeon: Larry Sons, MD;  Location: [facility] OR[facility];  Service: Urology;  Laterality: Left;   EYE SURGERY Bilateral    cataract extractions   JOINT REPLACEMENT Right    total knee   TONSILLECTOMY     TONSILLECTOMY     TOTAL KNEE ARTHROPLASTY Right 09/27/2018   Procedure: TOTAL KNEE ARTHROPLASTY;  Surgeon: Larry Park, MD;  Location: [facility];  Service: Orthopedics;  Laterality: Right;   TOTAL KNEE ARTHROPLASTY Left 08/15/2019   Procedure: TOTAL KNEE ARTHROPLASTY;  Surgeon: Larry Park, MD;  Location: [facility];  Service: Orthopedics;  Laterality: Left;       Home Medications    Prior to Admission medications   Medication Sig Start Date End Date Taking? Authorizing Provider  amoxicillin-clavulanate (AUGMENTIN) 875-125 MG tablet Take 1 tablet by mouth every 12 (twelve) hours. 10/14/21  Yes Sharion Balloon, NP  fluticasone (FLONASE) 50  MCG/ACT nasal spray Place 1 spray into both nostrils daily. 10/14/21  Yes Sharion Balloon, NP  ACCU-CHEK SOFTCLIX LANCETS lancets Use up to 4 times daily to check blood sugar. Diagnosis E11.9 10/22/16   Coral Spikes, DO  acetaminophen (TYLENOL) 500 MG tablet Take 1,500 mg by mouth 2 (two) times daily.    [provider]  amLODipine (NORVASC) 10 MG tablet TAKE 1 TABLET BY MOUTH DAILY WITH LUNCH. 08/30/21   Burnard Hawthorne, FNP  aspirin 81 MG chewable tablet Chew 81 mg by mouth daily. Every Tuesday and Friday     [provider]  azelastine (ASTELIN) 0.1 % nasal spray Place 1 spray into both nostrils 2 (two) times daily. Use in each nostril as directed Patient taking differently: Place 1 spray into both nostrils daily as needed for rhinitis. Use in each nostril as directed 09/21/20   Burnard Hawthorne, FNP  Cyanocobalamin (B-12) 5000 MCG SUBL Place 5,000 mcg under the tongue daily.     [provider]  docusate sodium (COLACE) 100 MG capsule Take 100 mg by mouth daily.    [provider]  DULoxetine (CYMBALTA) 30 MG capsule TAKE 1 CAPSULE (30 MG TOTAL) BY MOUTH 2 (TWO) TIMES DAILY. 05/21/21   Burnard Hawthorne, FNP  glucose blood (ONETOUCH VERIO) test strip TEST 3 TIMES A DAY 10/15/18   Gerlene Fee, NP  hydrochlorothiazide (MICROZIDE) 12.5 MG capsule TAKE 1 CAPSULE BY MOUTH EVERY DAY Patient taking differently: Take 12.5 mg by mouth every morning. 08/19/21   Burnard Hawthorne, FNP  loratadine (CLARITIN) 10 MG tablet Take 10 mg by mouth every morning.    [provider]  losartan (COZAAR) 100 MG tablet TAKE 1 TABLET BY MOUTH EVERY DAY 08/30/21   Burnard Hawthorne, FNP  metFORMIN (GLUCOPHAGE) 500 MG tablet TAKE 1 TABLET BY MOUTH 2 TIMES DAILY WITH A MEAL. 08/30/21   Burnard Hawthorne, FNP  metoprolol succinate (TOPROL-XL) 25 MG 24 hr tablet TAKE 1 TABLET BY MOUTH EVERY DAY Patient taking differently: Take 25 mg by mouth every morning. 08/19/21   Burnard Hawthorne, FNP  pravastatin (PRAVACHOL) 40 MG tablet TAKE 1 TABLET BY MOUTH EVERY DAY Patient taking differently: Take 40 mg by mouth every Monday, Wednesday, and Friday. 04/07/21   Burnard Hawthorne, FNP  tamsulosin (FLOMAX) 0.4 MG CAPS capsule TAKE 2 CAPSULES BY MOUTH EVERY DAY Patient taking differently: Take 0.4 mg by mouth daily after breakfast. 06/04/21   Stoioff, Larry Fairly, MD  traZODone (DESYREL) 50 MG tablet TAKE 1 TABLET BY MOUTH AT BEDTIME AS NEEDED FOR SLEEP Patient taking differently: Take 50 mg by mouth at  bedtime. 08/16/21   Burnard Hawthorne, FNP    Family History Family History  Problem Relation Age of Onset   Heart failure Father    Heart attack Mother     Social History Social History   Tobacco Use   Smoking status: Never   Smokeless tobacco: Never   Tobacco comments:    tobacco use - no  Vaping Use   Vaping Use: Never used  Substance Use Topics   Alcohol use: Yes    Comment: 6   Drug use: No     Allergies   Ibuprofen   Review of Systems Review of Systems  HENT:  Positive for congestion, ear pain, postnasal drip and sinus pressure. Negative for sore throat.   Respiratory:  Positive for cough. Negative for shortness of breath.   Cardiovascular:  Negative  for chest pain and palpitations.  Skin:  Negative for color change and rash.  All other systems reviewed and are negative.   Physical Exam Triage Vital Signs ED Triage Vitals  Enc Vitals Group     BP      Pulse      Resp      Temp      Temp src      SpO2      Weight      Height      Head Circumference      Peak Flow      Pain Score      Pain Loc      Pain Edu?      Excl. in [place]?    No data found.  Updated Vital Signs BP (!) 143/81   Pulse 74   Temp 98.6 F (37 C) (Oral)   Resp 20   SpO2 98%   Visual Acuity Right Eye Distance:   Left Eye Distance:   Bilateral Distance:    Right Eye Near:   Left Eye Near:    Bilateral Near:     Physical Exam Vitals and nursing note reviewed.  Constitutional:      General: He is not in acute distress.    Appearance: He is well-developed.  HENT:     Head: Normocephalic and atraumatic.     Right Ear: Tympanic membrane normal.     Left Ear: Tympanic membrane normal.     Nose: Congestion present.     Mouth/Throat:     Mouth: Mucous membranes are moist.     Pharynx: Oropharynx is clear.  Eyes:     Conjunctiva/sclera: Conjunctivae normal.  Cardiovascular:     Rate and Rhythm: Normal rate and regular rhythm.     Heart sounds: Normal heart sounds.   Pulmonary:     Effort: Pulmonary effort is normal. No respiratory distress.     Breath sounds: Normal breath sounds.  Abdominal:     Palpations: Abdomen is soft.     Tenderness: There is no abdominal tenderness.  Musculoskeletal:     Cervical back: Neck supple.  Skin:    General: Skin is warm and dry.  Neurological:     Mental Status: He is alert.  Psychiatric:        Mood and Affect: Mood normal.        Behavior: Behavior normal.     UC Treatments / Results  Labs (all labs ordered are listed, but only abnormal results are displayed) Labs Reviewed - No data to display  EKG   Radiology No results found.  Procedures Procedures (including critical care time)  Medications Ordered in UC Medications - No data to display  Initial Impression / Assessment and Plan / UC Course  I have reviewed the triage vital signs and the nursing notes.  Pertinent labs & imaging results that were available during my care of the patient were reviewed by me and considered in my medical decision making (see chart for details).  Acute sinusitis.  Treating with Augmentin and Flonase nasal spray.  Instructed patient to follow-up with his PCP if his symptoms are not improving.  He agrees to plan of care.   Final Clinical Impressions(s) / UC Diagnoses   Final diagnoses:  Acute non-recurrent maxillary sinusitis     Discharge Instructions      Take the Augmentin and use the Flonase nasal spray as directed.    Follow up with your primary care provider  if your symptoms are not improving.         ED Prescriptions     Medication Sig Dispense Auth. Provider   amoxicillin-clavulanate (AUGMENTIN) 875-125 MG tablet Take 1 tablet by mouth every 12 (twelve) hours. 14 tablet Sharion Balloon, NP   fluticasone (FLONASE) 50 MCG/ACT nasal spray Place 1 spray into both nostrils daily. 16 g Sharion Balloon, NP      PDMP not reviewed this encounter.   Sharion Balloon, NP 10/14/21 1137

## 2021-10-14 NOTE — Discharge Instructions (Addendum)
Take the Augmentin and use the Flonase nasal spray as directed.    Follow up with your primary care provider if your symptoms are not improving.

## 2021-10-22 ENCOUNTER — Other Ambulatory Visit: Payer: Self-pay

## 2021-10-22 ENCOUNTER — Telehealth: Payer: Self-pay | Admitting: Family

## 2021-10-22 MED ORDER — OSELTAMIVIR PHOSPHATE 75 MG PO CAPS
75.0000 mg | ORAL_CAPSULE | Freq: Every day | ORAL | 0 refills | Status: DC
Start: 2021-10-22 — End: 2021-11-19

## 2021-10-22 NOTE — Telephone Encounter (Signed)
Pt's wife aware that Tamiflu had been sent to pharmacy & that if symptoms occurred they should start no later than 48 hrs later. Pt's wife will pick up from pharmacy.

## 2021-10-22 NOTE — Telephone Encounter (Signed)
Patient's wife called and said patient was exposed to flu last night by his granddaughter who tested positive for the flu. He would like to know if he could get some Tamiflu.

## 2021-10-27 ENCOUNTER — Other Ambulatory Visit: Payer: Self-pay | Admitting: Family

## 2021-10-27 ENCOUNTER — Encounter: Payer: Self-pay | Admitting: Family

## 2021-10-27 DIAGNOSIS — R0981 Nasal congestion: Secondary | ICD-10-CM

## 2021-10-27 MED ORDER — AMOXICILLIN-POT CLAVULANATE 875-125 MG PO TABS: 1.0000 | ORAL_TABLET | Freq: Two times a day (BID) | ORAL | 0 refills | Status: DC

## 2021-11-01 ENCOUNTER — Telehealth: Payer: Self-pay

## 2021-11-01 NOTE — Telephone Encounter (Signed)
Pt LM on triage line requesting call back in regards to billing information. Called pt back and gave him the number to billing department. Pt voiced understanding.

## 2021-11-03 ENCOUNTER — Telehealth: Payer: Self-pay | Admitting: Family

## 2021-11-03 ENCOUNTER — Encounter: Payer: Self-pay | Admitting: Adult Health

## 2021-11-03 ENCOUNTER — Other Ambulatory Visit: Payer: Self-pay

## 2021-11-03 ENCOUNTER — Ambulatory Visit (INDEPENDENT_AMBULATORY_CARE_PROVIDER_SITE_OTHER): Payer: PPO | Admitting: Adult Health

## 2021-11-03 ENCOUNTER — Other Ambulatory Visit: Payer: Self-pay | Admitting: Adult Health

## 2021-11-03 VITALS — BP 124/64 | HR 76 | Temp 98.0°F | Resp 18 | Ht 72.0 in | Wt 213.2 lb

## 2021-11-03 DIAGNOSIS — H60391 Other infective otitis externa, right ear: Secondary | ICD-10-CM

## 2021-11-03 DIAGNOSIS — J0141 Acute recurrent pansinusitis: Secondary | ICD-10-CM

## 2021-11-03 MED ORDER — DOXYCYCLINE HYCLATE 100 MG PO TABS: 100.0000 mg | ORAL_TABLET | Freq: Two times a day (BID) | ORAL | 0 refills | Status: DC

## 2021-11-03 MED ORDER — PREDNISONE 10 MG (21) PO TBPK: ORAL_TABLET | ORAL | 0 refills | Status: DC

## 2021-11-03 MED ORDER — NEOMYCIN-POLYMYXIN-HC 3.5-10000-1 OT SUSP: 3.0000 [drp] | Freq: Four times a day (QID) | OTIC | 0 refills | Status: DC

## 2021-11-03 MED ORDER — CIPROFLOXACIN-DEXAMETHASONE 0.3-0.1 % OT SUSP: 4.0000 [drp] | Freq: Two times a day (BID) | OTIC | 0 refills | Status: DC

## 2021-11-03 NOTE — Telephone Encounter (Signed)
Pt called and let him know that Cortisporin was sent. He will let us know if any issues.

## 2021-11-03 NOTE — Progress Notes (Signed)
Acute Office Visit  Subjective:    Patient ID: Larry Tsuda., male    DOB: 1946-10-05, 75 y.o.   MRN: [id number]  Chief Complaint  Patient presents with   Sinusitis    URI  This is a new problem. The current episode started 1 to 4 weeks ago. The problem has been unchanged. The maximum temperature recorded prior to his arrival was more than 104 F (fever initially and then resolved. 3 weeks ago.). Associated symptoms include congestion, coughing, ear pain, rhinorrhea and sneezing. Pertinent negatives include no abdominal pain, chest pain, diarrhea, dysuria, headaches, joint pain, joint swelling, nausea, neck pain, plugged ear sensation, rash, sinus pain, sore throat, swollen glands, vomiting or wheezing. Treatments tried: two rounds of augmentin. The treatment provided mild relief.  Patient is in today for continues right ear pain, after two rounds of Augmentin, still has nasal congestion in head and nasal. Sinus pressure.  He has taken 2 rounds of Augmentin and reports that he does feel like he got some improvement but seems like some of the sinus pressure and congestion is persisting  Diabetic a1c last 6.1.   Patient  denies any fever, body aches,chills, rash, chest pain, shortness of breath, nausea, vomiting, or diarrhea.    Past Medical History:  Diagnosis Date   Arthritis    Depression    DM (diabetes mellitus) ([place])    Extrinsic asthma, unspecified    childhood   GERD (gastroesophageal reflux disease)    rare   History of kidney stones    Hives of unknown origin    HTN (hypertension)    Other allergy, other than to medicinal agents    RBBB    Sleep apnea    25 years ago mild lost weight no longer uses cpap   Wound of right leg 08/2018   area just below knee size of quarter, reddened around edges    Past Surgical History:  Procedure Laterality Date   BACK SURGERY  1995   ruptured disc encapsulated nerves   CATARACT EXTRACTION, BILATERAL  2016   Dr[facility] Kerman [facility] at  [facility]   CYSTOSCOPY/URETEROSCOPY/HOLMIUM LASER/STENT PLACEMENT Left 08/24/2021   Procedure: CYSTOSCOPY/URETEROSCOPY/HOLMIUM LASER/STENT PLACEMENT;  Surgeon: Abbie Sons, MD;  Location: [facility] OR[facility];  Service: Urology;  Laterality: Left;   EYE SURGERY Bilateral    cataract extractions   JOINT REPLACEMENT Right    total knee   TONSILLECTOMY     TONSILLECTOMY     TOTAL KNEE ARTHROPLASTY Right 09/27/2018   Procedure: TOTAL KNEE ARTHROPLASTY;  Surgeon: Thornton Park, MD;  Location: [facility];  Service: Orthopedics;  Laterality: Right;   TOTAL KNEE ARTHROPLASTY Left 08/15/2019   Procedure: TOTAL KNEE ARTHROPLASTY;  Surgeon: Thornton Park, MD;  Location: [facility];  Service: Orthopedics;  Laterality: Left;    Family History  Problem Relation Age of Onset   Heart failure Father    Heart attack Mother     Social History   Socioeconomic History   Marital status: Married    Spouse name: Pam   Number of children: 2   Years of education: Not on file   Highest education level: Not on file  Occupational History    Employer: [employer]    Comment: retired  Tobacco Use   Smoking status: Never   Smokeless tobacco: Never   Tobacco comments:    tobacco use - no  Vaping Use   Vaping Use: Never used  Substance and Sexual Activity   Alcohol  use: Yes    Comment: 6   Drug use: No   Sexual activity: Yes  Other Topics Concern   Not on file  Social History Narrative   Not on file   Social Determinants of Health   Financial Resource Strain: Low Risk    Difficulty of Paying Living Expenses: Not hard at all  Food Insecurity: No Food Insecurity   Worried About Charity fundraiser in the Last Year: Never true   [employer] in the Last Year: Never true  Transportation Needs: No Transportation Needs   Lack of Transportation (Medical): No   Lack of Transportation (Non-Medical): No  Physical Activity: Insufficiently Active   Days of Exercise per Week: 3 days    Minutes of Exercise per Session: 30 min  Stress: No Stress Concern Present   Feeling of Stress : Not at all  Social Connections: Not on file  Intimate Partner Violence: Not At Risk   Fear of Current or Ex-Partner: No   Emotionally Abused: No   Physically Abused: No   Sexually Abused: No    Outpatient Medications Prior to Visit  Medication Sig Dispense Refill   ACCU-CHEK SOFTCLIX LANCETS lancets Use up to 4 times daily to check blood sugar. Diagnosis E11.9 100 each 12   acetaminophen (TYLENOL) 500 MG tablet Take 1,500 mg by mouth 2 (two) times daily.     amLODipine (NORVASC) 10 MG tablet TAKE 1 TABLET BY MOUTH DAILY WITH LUNCH. 90 tablet 1   aspirin 81 MG chewable tablet Chew 81 mg by mouth daily. Every Tuesday and Friday     azelastine (ASTELIN) 0.1 % nasal spray Place 1 spray into both nostrils 2 (two) times daily. Use in each nostril as directed (Patient taking differently: Place 1 spray into both nostrils daily as needed for rhinitis. Use in each nostril as directed) 30 mL 4   Cyanocobalamin (B-12) 5000 MCG SUBL Place 5,000 mcg under the tongue daily.      docusate sodium (COLACE) 100 MG capsule Take 100 mg by mouth daily.     DULoxetine (CYMBALTA) 30 MG capsule TAKE 1 CAPSULE (30 MG TOTAL) BY MOUTH 2 (TWO) TIMES DAILY. 180 capsule 2   fluticasone (FLONASE) 50 MCG/ACT nasal spray Place 1 spray into both nostrils daily. 16 g 0   glucose blood (ONETOUCH VERIO) test strip TEST 3 TIMES A DAY 100 each 0   hydrochlorothiazide (MICROZIDE) 12.5 MG capsule TAKE 1 CAPSULE BY MOUTH EVERY DAY (Patient taking differently: Take 12.5 mg by mouth every morning.) 90 capsule 0   loratadine (CLARITIN) 10 MG tablet Take 10 mg by mouth every morning.     losartan (COZAAR) 100 MG tablet TAKE 1 TABLET BY MOUTH EVERY DAY 90 tablet 1   metFORMIN (GLUCOPHAGE) 500 MG tablet TAKE 1 TABLET BY MOUTH 2 TIMES DAILY WITH A MEAL. 180 tablet 1   metoprolol succinate (TOPROL-XL) 25 MG 24 hr tablet TAKE 1 TABLET BY MOUTH  EVERY DAY (Patient taking differently: Take 25 mg by mouth every morning.) 90 tablet 0   oseltamivir (TAMIFLU) 75 MG capsule Take 1 capsule (75 mg total) by mouth daily. 10 capsule 0   pravastatin (PRAVACHOL) 40 MG tablet TAKE 1 TABLET BY MOUTH EVERY DAY (Patient taking differently: Take 40 mg by mouth every Monday, Wednesday, and Friday.) 90 tablet 1   tamsulosin (FLOMAX) 0.4 MG CAPS capsule TAKE 2 CAPSULES BY MOUTH EVERY DAY (Patient taking differently: Take 0.4 mg by mouth daily after breakfast.) 180  capsule 1   traZODone (DESYREL) 50 MG tablet TAKE 1 TABLET BY MOUTH AT BEDTIME AS NEEDED FOR SLEEP (Patient taking differently: Take 50 mg by mouth at bedtime.) 90 tablet 0   amoxicillin-clavulanate (AUGMENTIN) 875-125 MG tablet Take 1 tablet by mouth every 12 (twelve) hours. 14 tablet 0   No facility-administered medications prior to visit.    Allergies  Allergen Reactions   Ibuprofen Other (See Comments)    Ibuprofen and motrin causes bp to elevate    Review of Systems  Constitutional: Negative.   HENT:  Positive for congestion, ear pain, postnasal drip, rhinorrhea, sinus pressure and sneezing. Negative for dental problem, drooling, facial swelling, hearing loss, mouth sores, nosebleeds, sinus pain, sore throat, tinnitus, trouble swallowing and voice change.   Respiratory:  Positive for cough. Negative for apnea, choking, chest tightness, shortness of breath, wheezing and stridor.   Cardiovascular: Negative.  Negative for chest pain.  Gastrointestinal: Negative.  Negative for abdominal pain, diarrhea, nausea and vomiting.  Genitourinary: Negative.  Negative for dysuria.  Musculoskeletal: Negative.  Negative for joint pain and neck pain.  Skin:  Negative for rash.  Neurological: Negative.  Negative for headaches.  Psychiatric/Behavioral: Negative.        Objective:    Physical Exam Vitals reviewed.  Constitutional:      General: He is not in acute distress.    Appearance: Normal  appearance. He is not ill-appearing, toxic-appearing or diaphoretic.  HENT:     Head: Normocephalic and atraumatic.     Jaw: There is normal jaw occlusion.     Salivary Glands: Right salivary gland is not diffusely enlarged or tender. Left salivary gland is not diffusely enlarged or tender.     Right Ear: External ear normal. Swelling and tenderness present. No middle ear effusion. There is no impacted cerumen.     Left Ear: External ear normal.  No middle ear effusion. There is no impacted cerumen.     Nose: Mucosal edema, congestion and rhinorrhea present. Rhinorrhea is purulent.     Right Sinus: Maxillary sinus tenderness present. No frontal sinus tenderness.     Left Sinus: Maxillary sinus tenderness present. No frontal sinus tenderness.     Mouth/Throat:     Lips: Pink.     Mouth: Mucous membranes are moist.     Pharynx: No oropharyngeal exudate or posterior oropharyngeal erythema.     Comments: Maxillary sinus pressure/ pain  Eyes:     Extraocular Movements: Extraocular movements intact.     Conjunctiva/sclera: Conjunctivae normal.     Pupils: Pupils are equal, round, and reactive to light.  Cardiovascular:     Rate and Rhythm: Normal rate and regular rhythm.     Pulses: Normal pulses.     Heart sounds: Normal heart sounds.  Pulmonary:     Effort: Pulmonary effort is normal.     Breath sounds: Normal breath sounds.  Abdominal:     General: Bowel sounds are normal.  Musculoskeletal:        General: Normal range of motion.     Cervical back: Normal range of motion and neck supple.     Right lower leg: No edema.     Left lower leg: No edema.  Lymphadenopathy:     Cervical: No cervical adenopathy.  Skin:    General: Skin is warm.     Findings: No erythema or rash.  Neurological:     Mental Status: He is alert and oriented to person, place, and time.  Gait: Gait normal.  Psychiatric:        Mood and Affect: Mood normal.        Behavior: Behavior normal.        Thought  Content: Thought content normal.        Judgment: Judgment normal.    BP 124/64   Pulse 76   Temp 98 F (36.7 C) (Oral)   Resp 18   Ht 6' (1.829 m)   Wt 213 lb 4 oz (96.7 kg)   SpO2 99%   BMI 28.92 kg/m  Wt Readings from Last 3 Encounters:  11/03/21 213 lb 4 oz (96.7 kg)  09/13/21 209 lb 12.8 oz (95.2 kg)  09/09/21 214 lb (97.1 kg)    Health Maintenance Due  Topic Date Due   Zoster Vaccines- Shingrix (1 of 2) Never done   COVID-19 Vaccine (4 - Booster for Pfizer series) 12/20/2020    There are no preventive care reminders to display for this patient.   Lab Results  Component Value Date   TSH 1.95 09/21/2020   Lab Results  Component Value Date   WBC 8.1 08/23/2021   HGB 13.7 08/23/2021   HCT 39.8 08/23/2021   MCV 93.9 08/23/2021   PLT 300 08/23/2021   Lab Results  Component Value Date   NA 138 09/13/2021   K 3.9 09/13/2021   CO2 27 09/13/2021   GLUCOSE 116 (H) 09/13/2021   BUN 20 09/13/2021   CREATININE 0.87 09/13/2021   BILITOT 0.7 09/21/2020   ALKPHOS 69 09/21/2020   AST 23 09/21/2020   ALT 26 09/21/2020   PROT 7.1 09/21/2020   ALBUMIN 4.6 09/21/2020   CALCIUM 10.3 09/13/2021   ANIONGAP 7 08/23/2021   GFR 85.19 09/13/2021   Lab Results  Component Value Date   CHOL 119 09/13/2021   Lab Results  Component Value Date   HDL 52.30 09/13/2021   Lab Results  Component Value Date   LDLCALC 38 09/13/2021   Lab Results  Component Value Date   TRIG 140.0 09/13/2021   Lab Results  Component Value Date   CHOLHDL 2 09/13/2021   Lab Results  Component Value Date   HGBA1C 6.1 (A) 09/13/2021       Assessment & Plan:   Problem List Items Addressed This Visit       Respiratory   Sinusitis - Primary   Relevant Medications   predniSONE (STERAPRED UNI-PAK 21 TAB) 10 MG (21) TBPK tablet   doxycycline (VIBRA-TABS) 100 MG tablet   Other Visit Diagnoses     Other infective acute otitis externa of right ear       Relevant Medications    ciprofloxacin-dexamethasone (CIPRODEX) OTIC suspension        Meds ordered this encounter  Medications   ciprofloxacin-dexamethasone (CIPRODEX) OTIC suspension    Sig: Place 4 drops into the right ear 2 (two) times daily.    Dispense:  7.5 mL    Refill:  0   predniSONE (STERAPRED UNI-PAK 21 TAB) 10 MG (21) TBPK tablet    Sig: PO: Take 6 tablets on day 1:Take 5 tablets day 2:Take 4 tablets day 3: Take 3 tablets day 4:Take 2 tablets day five: 5 Take 1 tablet day 6    Dispense:  21 tablet    Refill:  0   doxycycline (VIBRA-TABS) 100 MG tablet    Sig: Take 1 tablet (100 mg total) by mouth 2 (two) times daily.    Dispense:  20 tablet  Refill:  0   Right ear on exam with otitis externa will prescribe Ciprodex as directed above.  Patient will also be given doxycycline and prednisone for recurrent sinusitis. Patient denies any new symptoms since 3 weeks ago just very persistent symptoms.  He has no fever.  He denies any new exposures or need for testing. Patient did request prednisone Dosepak feels that is helped him in the past, will prescribe however did discuss with him his hemoglobin A1c was 6.1 they should monitor his blood sugars closely and call if any blood sugars remain elevated.  Patient verbalized understanding Red Flags discussed. The patient was given clear instructions to go to ER or return to medical center if any red flags develop, symptoms do not improve, worsen or new problems develop. They verbalized understanding.  Return if symptoms worsen or fail to improve, for at any time for any worsening symptoms, Go to Emergency room/ urgent care if worse.   Marcille Buffy, FNP

## 2021-11-03 NOTE — Progress Notes (Signed)
Medications Discontinued During This Encounter  Medication Reason   ciprofloxacin-dexamethasone (CIPRODEX) OTIC suspension Completed Course    Meds ordered this encounter  Medications   neomycin-polymyxin-hydrocortisone (CORTISPORIN) 3.5-10000-1 OTIC suspension    Sig: Place 3 drops into the right ear 4 (four) times daily.    Dispense:  10 mL    Refill:  0

## 2021-11-03 NOTE — Telephone Encounter (Signed)
Pt was seen today by [facility] and was prescribed ear drops. ciprofloxacin-dexamethasone (CIPRODEX) OTIC suspension Pt states the ear drops cost over $100 was wondering if there was another type of medicine that he can be prescribed that doesn't cost that much

## 2021-11-03 NOTE — Patient Instructions (Signed)
Sinusitis, Adult Sinusitis is inflammation of your sinuses. Sinuses are hollow spaces in the bones around your face. Your sinuses are located: Around your eyes. In the middle of your forehead. Behind your nose. In your cheekbones. Mucus normally drains out of your sinuses. When your nasal tissues become inflamed or swollen, mucus can become trapped or blocked. This allows bacteria, viruses, and fungi to grow, which leads to infection. Most infections of the sinuses are caused by a virus. Sinusitis can develop quickly. It can last for up to 4 weeks (acute) or for more than 12 weeks (chronic). Sinusitis often develops after a cold. What are the causes? This condition is caused by anything that creates swelling in the sinuses or stops mucus from draining. This includes: Allergies. Asthma. Infection from bacteria or viruses. Deformities or blockages in your nose or sinuses. Abnormal growths in the nose (nasal polyps). Pollutants, such as chemicals or irritants in the air. Infection from fungi (rare). What increases the risk? You are more likely to develop this condition if you: Have a weak body defense system (immune system). Do a lot of swimming or diving. Overuse nasal sprays. Smoke. What are the signs or symptoms? The main symptoms of this condition are pain and a feeling of pressure around the affected sinuses. Other symptoms include: Stuffy nose or congestion. Thick drainage from your nose. Swelling and warmth over the affected sinuses. Headache. Upper toothache. A cough that may get worse at night. Extra mucus that collects in the throat or the back of the nose (postnasal drip). Decreased sense of smell and taste. Fatigue. A fever. Sore throat. Bad breath. How is this diagnosed? This condition is diagnosed based on: Your symptoms. Your medical history. A physical exam. Tests to find out if your condition is acute or chronic. This may include: Checking your nose for nasal  polyps. Viewing your sinuses using a device that has a light (endoscope). Testing for allergies or bacteria. Imaging tests, such as an MRI or CT scan. In rare cases, a bone biopsy may be done to rule out more serious types of fungal sinus disease. How is this treated? Treatment for sinusitis depends on the cause and whether your condition is chronic or acute. If caused by a virus, your symptoms should go away on their own within 10 days. You may be given medicines to relieve symptoms. They include: Medicines that shrink swollen nasal passages (topical intranasal decongestants). Medicines that treat allergies (antihistamines). A spray that eases inflammation of the nostrils (topical intranasal corticosteroids). Rinses that help get rid of thick mucus in your nose (nasal saline washes). If caused by bacteria, your health care provider may recommend waiting to see if your symptoms improve. Most bacterial infections will get better without antibiotic medicine. You may be given antibiotics if you have: A severe infection. A weak immune system. If caused by narrow nasal passages or nasal polyps, you may need to have surgery. Follow these instructions at home: Medicines Take, use, or apply over-the-counter and prescription medicines only as told by your health care provider. These may include nasal sprays. If you were prescribed an antibiotic medicine, take it as told by your health care provider. Do not stop taking the antibiotic even if you start to feel better. Hydrate and humidify  Drink enough fluid to keep your urine pale yellow. Staying hydrated will help to thin your mucus. Use a cool mist humidifier to keep the humidity level in your home above 50%. Inhale steam for 10-15 minutes, 3-4 times  a day, or as told by your health care provider. You can do this in the bathroom while a hot shower is running. Limit your exposure to cool or dry air. Rest Rest as much as possible. Sleep with your  head raised (elevated). Make sure you get enough sleep each night. General instructions  Apply a warm, moist washcloth to your face 3-4 times a day or as told by your health care provider. This will help with discomfort. Wash your hands often with soap and water to reduce your exposure to germs. If soap and water are not available, use hand sanitizer. Do not smoke. Avoid being around people who are smoking (secondhand smoke). Keep all follow-up visits as told by your health care provider. This is important. Contact a health care provider if: You have a fever. Your symptoms get worse. Your symptoms do not improve within 10 days. Get help right away if: You have a severe headache. You have persistent vomiting. You have severe pain or swelling around your face or eyes. You have vision problems. You develop confusion. Your neck is stiff. You have trouble breathing. Summary Sinusitis is soreness and inflammation of your sinuses. Sinuses are hollow spaces in the bones around your face. This condition is caused by nasal tissues that become inflamed or swollen. The swelling traps or blocks the flow of mucus. This allows bacteria, viruses, and fungi to grow, which leads to infection. If you were prescribed an antibiotic medicine, take it as told by your health care provider. Do not stop taking the antibiotic even if you start to feel better. Keep all follow-up visits as told by your health care provider. This is important. This information is not intended to replace advice given to you by your health care provider. Make sure you discuss any questions you have with your health care provider. Document Revised: 04/23/2018 Document Reviewed: 04/23/2018 Elsevier Patient Education  [place]. Prednisolone Tablets What is this medication? PREDNISOLONE (pred NISS oh lone) treats many conditions such as asthma, allergic reactions, arthritis, inflammatory bowel diseases, adrenal, and blood or  bone marrow disorders. It works by decreasing inflammation, slowing down an overactive immune system, or replacing cortisol normally made in the body. Cortisol is a hormone that plays an important role in how the body responds to stress, illness, and injury. It belongs to a group of medications called steroids. This medicine may be used for other purposes; ask your health care provider or pharmacist if you have questions. COMMON BRAND NAME(S): Millipred, Millipred DP, Millipred DP 12-Day, Millipred DP 6 Day, Prednoral What should I tell my care team before I take this medication? They need to know if you have any of these conditions: Cushing's syndrome Diabetes Glaucoma Heart problems or disease High blood pressure Infection such as herpes, measles, tuberculosis, or chickenpox Kidney disease Liver disease Mental problems Myasthenia gravis Osteoporosis Seizures Stomach ulcer or intestine disease including colitis and diverticulitis Thyroid problem An unusual or allergic reaction to lactose, prednisolone, other medications, foods, dyes, or preservatives Pregnant or trying to get pregnant Breast-feeding How should I use this medication? Take this medication by mouth with a glass of water. Follow the directions on the prescription label. Take it with food or milk to avoid stomach upset. If you are taking this medication once a day, take it in the morning. Do not take more medication than you are told to take. Do not suddenly stop taking your medication because you may develop a severe reaction. Your care team will  tell you how much medication to take. If your care team wants you to stop the medication, the dose may be slowly lowered over time to avoid any side effects. Talk to your care team about the use of this medication in children. Special care may be needed. Overdosage: If you think you have taken too much of this medicine contact a poison control center or emergency room at once. NOTE:  This medicine is only for you. Do not share this medicine with others. What if I miss a dose? If you miss a dose, take it as soon as you can. If it is almost time for your next dose, take only that dose. Do not take double or extra doses. What may interact with this medication? Do not take this medication with any of the following: Metyrapone Mifepristone This medication may also interact with the following: Aminoglutethimide Amphotericin B Aspirin and aspirin-like medications Barbiturates Certain medications for diabetes, like glipizide or glyburide Cholestyramine Cholinesterase inhibitors Cyclosporine Digoxin Diuretics Ephedrine Male hormones, like estrogens and birth control pills Isoniazid Ketoconazole NSAIDS, medications for pain and inflammation, like ibuprofen or naproxen Phenytoin Rifampin Toxoids Vaccines Warfarin This list may not describe all possible interactions. Give your health care provider a list of all the medicines, herbs, non-prescription drugs, or dietary supplements you use. Also tell them if you smoke, drink alcohol, or use illegal drugs. Some items may interact with your medicine. What should I watch for while using this medication? Visit your care team for regular checks on your progress. If you are taking this medication over a prolonged period, carry an identification card with your name and address, the type and dose of your medication, and your care team's name and address. This medication may increase your risk of getting an infection. Tell your care team if you are around anyone with measles or chickenpox, or if you develop sores or blisters that do not heal properly. If you are going to have surgery, tell your care team that you have taken this medication within the last twelve months. Ask your care team about your diet. You may need to lower the amount of salt you eat. This medication may increase blood sugar. Ask your care team if changes in diet  or medications are needed if you have diabetes. What side effects may I notice from receiving this medication? Side effects that you should report to your care team as soon as possible: Allergic reactions--skin rash, itching, hives, swelling of the face, lips, tongue, or throat Cushing syndrome--increased fat around the midsection, upper back, neck, or face, pink or purple stretch marks on the skin, thinning, fragile skin that easily bruises, unexpected hair growth High blood sugar (hyperglycemia)--increased thirst or amount of urine, unusual weakness or fatigue, blurry vision Increase in blood pressure Infection--fever, chills, cough, sore throat, wounds that don't heal, pain or trouble when passing urine, general feeling of discomfort or being unwell Low adrenal gland function--nausea, vomiting, loss of appetite, unusual weakness or fatigue, dizziness Mood and behavior changes--anxiety, nervousness, confusion, hallucinations, irritability, hostility, thoughts of suicide or self-harm, worsening mood, feelings of depression Stomach bleeding--bloody or black, tar-like stools, vomiting blood or brown material that looks like coffee grounds Swelling of the ankles, hands, or feet Side effects that usually do not require medical attention (report to your care team if they continue or are bothersome): Acne General discomfort and fatigue Headache Increase in appetite Nausea Trouble sleeping Weight gain This list may not describe all possible side effects. Call your doctor  for medical advice about side effects. You may report side effects to FDA at 1-800-FDA-1088. Where should I keep my medication? Keep out of the reach of children. Store at room temperature between 15 and 30 degrees C (59 and 86 degrees F). Keep container tightly closed. Throw away any unused medication after the expiration date. NOTE: This sheet is a summary. It may not cover all possible information. If you have questions about  this medicine, talk to your doctor, pharmacist, or health care provider.  2022 Elsevier/Gold Standard (2021-02-19 00:00:00) Ciprofloxacin; Dexamethasone ear suspension What is this medication? CIPROFLOXACIN; DEXAMETHASONE (sip roe FLOX a sin; dex a METH a sone) is used to treat ear infections. It also stops the swelling and itching caused by the infection. This medicine may be used for other purposes; ask your health care provider or pharmacist if you have questions. COMMON BRAND NAME(S): Ciprodex Otic What should I tell my care team before I take this medication? They need to know if you have any of these conditions: any other active infection viral ear infection an unusual or allergic reaction to ciprofloxacin; dexamethasone, other medicines, foods, dyes, or preservatives pregnant or trying to get pregnant breast-feeding How should I use this medication? This medicine is only for use in the ears. Wash your hands with soap and water. Do not insert any object or swab into the ear canal. Gently warm the bottle by holding it in the hand for 1 to 2 minutes. Shake the bottle immediately before using. Lie down on your side with the affected ear up. Try not to touch the tip of the dropper to your ear, fingertips, or other surface. Squeeze the bottle gently to put the prescribed number of drops in the ear canal. Stay in this position for 30 to 60 seconds to help the drops soak into the ear. Repeat the steps for the other ear if both ears are infected. Do not use your medicine more often than directed. Finish the full course of medicine prescribed by your doctor or health care professional even if you think your condition is better. Talk to your pediatrician regarding the use of this medicine in children. While this drug may be prescribed for children as young as in children 74 months of age and older for selected conditions, precautions do apply. Overdosage: If you think you have taken too much of this  medicine contact a poison control center or emergency room at once. NOTE: This medicine is only for you. Do not share this medicine with others. What if I miss a dose? If you miss a dose, use it as soon as you can. If it is almost time for your next dose, use only that dose. Do not use double or extra doses. What may interact with this medication? Interactions are not expected. Do not use any other ear products without telling your doctor or health care professional. This list may not describe all possible interactions. Give your health care provider a list of all the medicines, herbs, non-prescription drugs, or dietary supplements you use. Also tell them if you smoke, drink alcohol, or use illegal drugs. Some items may interact with your medicine. What should I watch for while using this medication? Tell your doctor or health care professional if your ear infection does not get better in a few days. If rash or allergic reaction occurs, stop using immediately and contact your doctor or health care professional. It is important that you keep the infected ear(s) clean and dry. When  bathing, try not to get the infected ear(s) wet. Do not go swimming unless your doctor or health care professional has told you otherwise. To prevent the spread of infection, do not share ear products or share towels and washcloths with anyone else. What side effects may I notice from receiving this medication? Side effects that you should report to your doctor or health care professional as soon as possible: allergic reactions like skin rash, itching or hives, swelling of the face, lips, or tongue burning, itching, and redness worsening ear pain Side effects that usually do not require medical attention (report to your doctor or health care professional if they continue or are bothersome): abnormal feeling in the ear headache unpleasant feeling while putting the drops in the ear This list may not describe all possible  side effects. Call your doctor for medical advice about side effects. You may report side effects to FDA at 1-800-FDA-1088. Where should I keep my medication? Keep out of the reach of children. Store at room temperature between 15 and 30 degrees C (59 and 86 degrees F). Do not freeze. Protect from light. Throw away any unused medicine after the expiration date. NOTE: This sheet is a summary. It may not cover all possible information. If you have questions about this medicine, talk to your doctor, pharmacist, or health care provider.  2022 Elsevier/Gold Standard (2008-04-11 00:00:00) Doxycycline Capsules or Tablets What is this medication? DOXYCYCLINE (dox i SYE kleen) treats infections caused by bacteria. It belongs to a group of medications called tetracycline antibiotics. It will not treat colds, the flu, or infections caused by viruses. This medicine may be used for other purposes; ask your health care provider or pharmacist if you have questions. COMMON BRAND NAME(S): Acticlate, Adoxa, Adoxa CK, Adoxa Pak, Adoxa TT, Alodox, Avidoxy, Doxal, LYMEPAK, Mondoxyne NL, Monodox, Morgidox 1x, Morgidox 1x Kit, Morgidox 2x, Morgidox 2x Kit, NutriDox, Ocudox, [place], [place], [place], Vibra-Tabs, Vibramycin What should I tell my care team before I take this medication? They need to know if you have any of these conditions: Kidney disease Liver disease Long exposure to sunlight like working outdoors Recent stomach surgery Stomach or intestine problems such as colitis Vision Problems Yeast or fungal infection of the mouth or vagina An unusual or allergic reaction to doxycycline, tetracycline antibiotics, other medications, foods, dyes, or preservatives Pregnant or trying to get pregnant Breast-feeding How should I use this medication? Take this medication by mouth with water. Take it as directed on the prescription label at the same time every day. It is best to take this medication without food,  but if it upsets your stomach take it with food. Take all of this medication unless your care team tells you to stop it early. Keep taking it even if you think you are better. Take antacids and products with aluminum, calcium, magnesium, iron, and zinc in them at a different time of day than this medication. Talk to your care team if you have questions. Talk to your care team regarding the use of this medication in children. While this medication may be prescribed for selected conditions, precautions do apply. Overdosage: If you think you have taken too much of this medicine contact a poison control center or emergency room at once. NOTE: This medicine is only for you. Do not share this medicine with others. What if I miss a dose? If you miss a dose, take it as soon as you can. If it is almost time for your next dose, take only  that dose. Do not take double or extra doses. What may interact with this medication? Antacids, vitamins, or other products that contain aluminum, calcium, iron, magnesium, or zinc Barbiturates Birth control pills Bismuth subsalicylate Carbamazepine Methoxyflurane Oral retinoids such as acitretin, isotretinoin Other antibiotics Phenytoin Warfarin This list may not describe all possible interactions. Give your health care provider a list of all the medicines, herbs, non-prescription drugs, or dietary supplements you use. Also tell them if you smoke, drink alcohol, or use illegal drugs. Some items may interact with your medicine. What should I watch for while using this medication? Tell your care team if your symptoms do not improve. Do not treat diarrhea with over the counter products. Contact your care team if you have diarrhea that lasts more than 2 days or if it is severe and watery. Do not take this medication just before going to bed. It may not dissolve properly when you lay down and can cause pain in your throat. Drink plenty of fluids while taking this medication  to also help reduce irritation in your throat. This medication can make you more sensitive to the sun. Keep out of the sun. If you cannot avoid being in the sun, wear protective clothing and use sunscreen. Do not use sun lamps or tanning beds/booths. Birth control pills may not work properly while you are taking this medication. Talk to your care team about using an extra method of birth control. If you are being treated for a sexually transmitted infection, avoid sexual contact until you have finished your treatment. Your sexual partner may also need treatment. If you are using this medication to prevent malaria, you should still protect yourself from contact with mosquitos. Stay in screened-in areas, use mosquito nets, keep your body covered, and use an insect repellent. What side effects may I notice from receiving this medication? Side effects that you should report to your care team as soon as possible: Allergic reactions--skin rash, itching, hives, swelling of the face, lips, tongue, or throat Increased pressure around the brain--severe headache, change in vision, blurry vision, nausea, vomiting Joint pain Pain or trouble swallowing Redness, blistering, peeling, or loosening of the skin, including inside the mouth Severe diarrhea, fever Unusual vaginal discharge, itching, or odor Side effects that usually do not require medical attention (report these to your care team if they continue or are bothersome): Change in tooth color Diarrhea Headache Heartburn Nausea This list may not describe all possible side effects. Call your doctor for medical advice about side effects. You may report side effects to FDA at 1-800-FDA-1088. Where should I keep my medication? Keep out of the reach of children and pets. Store at room temperature, below 30 degrees C (86 degrees F). Protect from light. Keep container tightly closed. Throw away any unused medication after the expiration date. Taking this  medication after the expiration date can make you seriously ill. NOTE: This sheet is a summary. It may not cover all possible information. If you have questions about this medicine, talk to your doctor, pharmacist, or health care provider.  2022 Elsevier/Gold Standard (2021-02-06 00:00:00)

## 2021-11-12 ENCOUNTER — Other Ambulatory Visit: Payer: Self-pay | Admitting: Family

## 2021-11-12 ENCOUNTER — Other Ambulatory Visit: Payer: Self-pay

## 2021-11-12 ENCOUNTER — Ambulatory Visit
Admission: RE | Admit: 2021-11-12 | Discharge: 2021-11-12 | Disposition: A | Discharge status: Home / Self Care | Payer: PPO | Source: Ambulatory Visit | Attending: Urology | Admitting: Urology

## 2021-11-12 DIAGNOSIS — F339 Major depressive disorder, recurrent, unspecified: Secondary | ICD-10-CM

## 2021-11-12 DIAGNOSIS — N281 Cyst of kidney, acquired: Secondary | ICD-10-CM | POA: Diagnosis not present

## 2021-11-12 DIAGNOSIS — N200 Calculus of kidney: Secondary | ICD-10-CM | POA: Diagnosis not present

## 2021-11-19 ENCOUNTER — Other Ambulatory Visit: Payer: Self-pay

## 2021-11-19 ENCOUNTER — Encounter: Payer: Self-pay | Admitting: Urology

## 2021-11-19 ENCOUNTER — Ambulatory Visit: Payer: PPO | Admitting: Urology

## 2021-11-19 VITALS — BP 136/76 | HR 77 | Ht 72.0 in | Wt 208.0 lb

## 2021-11-19 DIAGNOSIS — N400 Enlarged prostate without lower urinary tract symptoms: Secondary | ICD-10-CM

## 2021-11-19 DIAGNOSIS — N200 Calculus of kidney: Secondary | ICD-10-CM | POA: Diagnosis not present

## 2021-11-19 NOTE — Progress Notes (Signed)
11/19/2021 9:32 AM   Larry Roman. [birth year] [id number]  Referring provider: Burnard Hawthorne, FNP [address] [place],  [place] [address]  Chief Complaint  Patient presents with   Nephrolithiasis    Urologic history:  1.  BPH with LUTS Tamsulosin daily  2.  Nephrolithiasis CT 07/19/2021 with 14 mm left proximal ureteral calculus/hydronephrosis and bilateral nonobstructing renal calculi Left ureteroscopy/stone removal 08/24/2021; stone impacted Declined metabolic evaluation  3.  Bilateral renal cysts  HPI: 75 y.o. male presents for follow-up visit.  Stent removed 09/09/2021 Follow-up renal ultrasound recommended to assess for hydronephrosis No complaints today Denies flank, abdominal or pelvic pain No gross hematuria Renal ultrasound performed 11/12/2021 shows no hydronephrosis   PMH: Past Medical History:  Diagnosis Date   Arthritis    Depression    DM (diabetes mellitus) ([place])    Extrinsic asthma, unspecified    childhood   GERD (gastroesophageal reflux disease)    rare   History of kidney stones    Hives of unknown origin    HTN (hypertension)    Other allergy, other than to medicinal agents    RBBB    Sleep apnea    25 years ago mild lost weight no longer uses cpap   Wound of right leg 08/2018   area just below knee size of quarter, reddened around edges    Surgical History: Past Surgical History:  Procedure Laterality Date   BACK SURGERY  1995   ruptured disc encapsulated nerves   CATARACT EXTRACTION, BILATERAL  2016   Dr. Kerman Passey at [facility]   CYSTOSCOPY/URETEROSCOPY/HOLMIUM LASER/STENT PLACEMENT Left 08/24/2021   Procedure: CYSTOSCOPY/URETEROSCOPY/HOLMIUM LASER/STENT PLACEMENT;  Surgeon: Abbie Sons, MD;  Location: [facility] OR[facility];  Service: Urology;  Laterality: Left;   EYE SURGERY Bilateral    cataract extractions   JOINT REPLACEMENT Right    total knee   TONSILLECTOMY     TONSILLECTOMY     TOTAL KNEE  ARTHROPLASTY Right 09/27/2018   Procedure: TOTAL KNEE ARTHROPLASTY;  Surgeon: Thornton Park, MD;  Location: [facility];  Service: Orthopedics;  Laterality: Right;   TOTAL KNEE ARTHROPLASTY Left 08/15/2019   Procedure: TOTAL KNEE ARTHROPLASTY;  Surgeon: Thornton Park, MD;  Location: [facility];  Service: Orthopedics;  Laterality: Left;    Home Medications:  Allergies as of 11/19/2021       Reactions   Ibuprofen Other (See Comments)   Ibuprofen and motrin causes bp to elevate        Medication List        Accurate as of November 19, 2021  9:32 AM. If you have any questions, ask your nurse or doctor.          STOP taking these medications    neomycin-polymyxin-hydrocortisone 3.5-10000-1 OTIC suspension Commonly known as: CORTISPORIN Stopped by: Abbie Sons, MD   oseltamivir 75 MG capsule Commonly known as: Tamiflu Stopped by: Abbie Sons, MD   predniSONE 10 MG (21) Tbpk tablet Commonly known as: STERAPRED UNI-PAK 21 TAB Stopped by: Abbie Sons, MD       TAKE these medications    Accu-Chek Softclix Lancets lancets Use up to 4 times daily to check blood sugar. Diagnosis E11.9   acetaminophen 500 MG tablet Commonly known as: TYLENOL Take 1,500 mg by mouth 2 (two) times daily.   amLODipine 10 MG tablet Commonly known as: NORVASC TAKE 1 TABLET BY MOUTH DAILY WITH LUNCH.   aspirin 81 MG chewable tablet Chew 81 mg  by mouth daily. Every Tuesday and Friday   azelastine 0.1 % nasal spray Commonly known as: ASTELIN Place 1 spray into both nostrils 2 (two) times daily. Use in each nostril as directed What changed:  when to take this reasons to take this   B-12 5000 MCG Subl Place 5,000 mcg under the tongue daily.   docusate sodium 100 MG capsule Commonly known as: COLACE Take 100 mg by mouth daily.   doxycycline 100 MG tablet Commonly known as: VIBRA-TABS Take 1 tablet (100 mg total) by mouth 2 (two) times daily.   DULoxetine 30 MG  capsule Commonly known as: CYMBALTA TAKE 1 CAPSULE (30 MG TOTAL) BY MOUTH 2 (TWO) TIMES DAILY.   fluticasone 50 MCG/ACT nasal spray Commonly known as: FLONASE Place 1 spray into both nostrils daily.   glucose blood test strip Commonly known as: OneTouch Verio TEST 3 TIMES A DAY   hydrochlorothiazide 12.5 MG capsule Commonly known as: MICROZIDE TAKE 1 CAPSULE BY MOUTH EVERY DAY What changed:  how much to take when to take this   loratadine 10 MG tablet Commonly known as: CLARITIN Take 10 mg by mouth every morning.   losartan 100 MG tablet Commonly known as: COZAAR TAKE 1 TABLET BY MOUTH EVERY DAY   metFORMIN 500 MG tablet Commonly known as: GLUCOPHAGE TAKE 1 TABLET BY MOUTH 2 TIMES DAILY WITH A MEAL.   metoprolol succinate 25 MG 24 hr tablet Commonly known as: TOPROL-XL TAKE 1 TABLET BY MOUTH EVERY DAY What changed: when to take this   pravastatin 40 MG tablet Commonly known as: PRAVACHOL TAKE 1 TABLET BY MOUTH EVERY DAY What changed: when to take this   tamsulosin 0.4 MG Caps capsule Commonly known as: FLOMAX TAKE 2 CAPSULES BY MOUTH EVERY DAY What changed:  how much to take when to take this   traZODone 50 MG tablet Commonly known as: DESYREL TAKE 1 TABLET BY MOUTH EVERY DAY AT BEDTIME AS NEEDED FOR SLEEP        Allergies:  Allergies  Allergen Reactions   Ibuprofen Other (See Comments)    Ibuprofen and motrin causes bp to elevate    Family History: Family History  Problem Relation Age of Onset   Heart failure Father    Heart attack Mother     Social History:  reports that he has never smoked. He has never used smokeless tobacco. He reports current alcohol use. He reports that he does not use drugs.   Physical Exam: BP 136/76    Pulse 77    Ht 6' (1.829 m)    Wt 208 lb (94.3 kg)    BMI 28.21 kg/m   Constitutional:  Alert and oriented, No acute distress. HEENT: [place] AT, moist mucus membranes.  Trachea midline, no masses. Cardiovascular: No  clubbing, cyanosis, or edema. Respiratory: Normal respiratory effort, no increased work of breathing. Psychiatric: Normal mood and affect.   Pertinent Imaging: Ultrasound images personally reviewed and interpreted  Ultrasound renal complete  Narrative CLINICAL DATA:  Nephrolithiasis status post left-sided lithotripsy  EXAM: RENAL / URINARY TRACT ULTRASOUND COMPLETE  COMPARISON:  Prior CT abdomen/pelvis 07/19/2021 and 12/28/2009; prior renal ultrasound 06/08/2021  FINDINGS: Right Kidney:  Renal measurements: 13.0 x 6.6 x 5.8 = volume: 263 mL. Mildly increased echogenicity of the renal parenchyma. Circumscribed anechoic simple cyst with imperceptible wall and positive posterior acoustic enhancement exophytic from the upper pole measures 2.7 x 3.3 x 3.7 cm. No convincing nephrolithiasis. No evidence of hydronephrosis.  Left Kidney:  Renal measurements: 12.4 x 6.5 x 5.1 cm = volume: 214 mL. Mildly increased renal parenchymal echogenicity. No evidence of hydronephrosis. Circumscribed anechoic simple cyst exophytic from the upper pole measures 2.8 x 2.6 x 2.7 cm. The wall is imperceptible and there is posterior acoustic enhancement. Slightly more complex cyst with thin internal septation versus calcification exophytic from the interpolar region measures 1.9 x 1.9 x 1.9 cm. Correlation with prior CT imaging demonstrates similar findings. A larger endophytic simple cyst in the lower pole measures 3.2 x 3.1 x 3.4 cm. No convincing evidence of nephrolithiasis.  Bladder:  Appears normal for degree of bladder distention.  Other:  None.  IMPRESSION: 1. No evidence of hydronephrosis or nephrolithiasis. 2. Bilateral simple and minimally complex renal cysts. 3. Mildly echogenic renal parenchyma bilaterally suggests underlying medical renal disease.   Electronically Signed By: Jacqulynn Cadet M.D. On: 11/14/2021 07:29   Assessment & Plan:    1.  BPH with LUTS Stable  on tamsulosin  2.  Nephrolithiasis Nonobstructing calculi seen on CT Recent ultrasound shows no hydronephrosis, bilateral renal cyst and no definite calculi Recommend 1 year follow-up with KUB; return earlier for renal colic/flank pain   Abbie Sons, MD  [facility] [address] [place], [place] [address] [phone]

## 2021-11-24 ENCOUNTER — Other Ambulatory Visit: Payer: Self-pay | Admitting: Family

## 2021-11-24 DIAGNOSIS — I10 Essential (primary) hypertension: Secondary | ICD-10-CM

## 2021-12-09 ENCOUNTER — Other Ambulatory Visit: Payer: Self-pay | Admitting: Urology

## 2021-12-09 DIAGNOSIS — N400 Enlarged prostate without lower urinary tract symptoms: Secondary | ICD-10-CM

## 2021-12-21 ENCOUNTER — Other Ambulatory Visit: Payer: Self-pay | Admitting: Family

## 2021-12-21 DIAGNOSIS — R0981 Nasal congestion: Secondary | ICD-10-CM

## 2022-01-12 ENCOUNTER — Ambulatory Visit: Payer: PPO

## 2022-02-11 ENCOUNTER — Other Ambulatory Visit: Payer: Self-pay | Admitting: Family

## 2022-02-13 ENCOUNTER — Other Ambulatory Visit: Payer: Self-pay | Admitting: Family

## 2022-02-13 DIAGNOSIS — I10 Essential (primary) hypertension: Secondary | ICD-10-CM

## 2022-02-13 DIAGNOSIS — F339 Major depressive disorder, recurrent, unspecified: Secondary | ICD-10-CM

## 2022-02-16 DIAGNOSIS — M19011 Primary osteoarthritis, right shoulder: Secondary | ICD-10-CM | POA: Diagnosis not present

## 2022-03-10 ENCOUNTER — Ambulatory Visit (INDEPENDENT_AMBULATORY_CARE_PROVIDER_SITE_OTHER): Payer: PPO | Admitting: Family

## 2022-03-10 ENCOUNTER — Encounter: Payer: Self-pay | Admitting: Family

## 2022-03-10 ENCOUNTER — Other Ambulatory Visit: Payer: Self-pay

## 2022-03-10 VITALS — BP 128/78 | HR 74 | Temp 97.3°F | Ht 72.0 in | Wt 215.2 lb

## 2022-03-10 DIAGNOSIS — F339 Major depressive disorder, recurrent, unspecified: Secondary | ICD-10-CM | POA: Diagnosis not present

## 2022-03-10 DIAGNOSIS — R0981 Nasal congestion: Secondary | ICD-10-CM

## 2022-03-10 DIAGNOSIS — I10 Essential (primary) hypertension: Secondary | ICD-10-CM | POA: Diagnosis not present

## 2022-03-10 DIAGNOSIS — E119 Type 2 diabetes mellitus without complications: Secondary | ICD-10-CM

## 2022-03-10 DIAGNOSIS — E785 Hyperlipidemia, unspecified: Secondary | ICD-10-CM | POA: Diagnosis not present

## 2022-03-10 DIAGNOSIS — E782 Mixed hyperlipidemia: Secondary | ICD-10-CM | POA: Diagnosis not present

## 2022-03-10 LAB — COMPREHENSIVE METABOLIC PANEL
ALT: 15 U/L (ref 0–53)
AST: 18 U/L (ref 0–37)
Albumin: 4.7 g/dL (ref 3.5–5.2)
Alkaline Phosphatase: 66 U/L (ref 39–117)
BUN: 15 mg/dL (ref 6–23)
CO2: 28 mEq/L (ref 19–32)
Calcium: 10.5 mg/dL (ref 8.4–10.5)
Chloride: 100 mEq/L (ref 96–112)
Creatinine, Ser: 0.9 mg/dL (ref 0.40–1.50)
GFR: 84.04 mL/min (ref >60.00)
Glucose, Bld: 114 mg/dL — ABNORMAL HIGH (ref 70–99)
Potassium: 4.1 mEq/L (ref 3.5–5.1)
Sodium: 137 mEq/L (ref 135–145)
Total Bilirubin: 1 mg/dL (ref 0.2–1.2)
Total Protein: 7.4 g/dL (ref 6.0–8.3)

## 2022-03-10 LAB — POCT GLYCOSYLATED HEMOGLOBIN (HGB A1C): Hemoglobin A1C: 6.8 % — AB (ref 4.0–5.6)

## 2022-03-10 LAB — MICROALBUMIN / CREATININE URINE RATIO
Creatinine,U: 143.8 mg/dL
Microalb Creat Ratio: 10.2 mg/g (ref 0.0–30.0)
Microalb, Ur: 14.7 mg/dL — ABNORMAL HIGH (ref 0.0–1.9)

## 2022-03-10 MED ORDER — AZELASTINE HCL 0.1 % NA SOLN: 1.0000 | Freq: Two times a day (BID) | NASAL | 4 refills | Status: DC

## 2022-03-10 MED ORDER — HYDROCHLOROTHIAZIDE 12.5 MG PO CAPS: 12.5000 mg | ORAL_CAPSULE | ORAL | 1 refills | Status: DC

## 2022-03-10 MED ORDER — FLUTICASONE PROPIONATE 50 MCG/ACT NA SUSP: NASAL | 1 refills | Status: DC

## 2022-03-10 MED ORDER — ALBUTEROL SULFATE HFA 108 (90 BASE) MCG/ACT IN AERS: 2.0000 | INHALATION_SPRAY | Freq: Four times a day (QID) | RESPIRATORY_TRACT | 0 refills | Status: DC | PRN

## 2022-03-10 MED ORDER — PRAVASTATIN SODIUM 40 MG PO TABS: 40.0000 mg | ORAL_TABLET | ORAL | 3 refills | Status: DC

## 2022-03-10 NOTE — Patient Instructions (Addendum)
Continue mucinex with plenty of water ? ?I would hold Bronkaid ? ?Start albuterol ? ?Use albuterol every 6 hours for first 24 hours to get good medication into the lungs and loosen congestion; after, you may use as needed and eventually stop all together when cough resolves. ? ?This is  Dr. Lupita Dawn  example of a  "Low GI"  Diet:  It will allow you to lose 4 to 8  lbs  per month if you follow it carefully.  Your goal with exercise is a minimum of 30 minutes of aerobic exercise 5 days per week (Walking does not count once it becomes easy!)  ? ? All of the foods can be found at grocery stores and in bulk at [employer].  The Atkins protein bars and shakes are available in more varieties at Target, [employer] and [employer].   ? ? ?7 AM Breakfast:  Choose from the following: ? Low carbohydrate Protein  Shakes (I recommend the  Premier Protein chocolate shakes,  EAS AdvantEdge "Carb Control" shakes  Or the Atkins shakes all are under 3 net carbs)  ?   a scrambled egg/bacon/cheese burrito made with Mission's "carb balance" whole wheat tortilla  (about 10 net carbs ) ? Jimmy Deans sells Physicist, medical (basically a quiche without the pastry crust) that is eaten cold and very convenient way to get your eggs.  8 carbs) ? If you make your own protein shakes, avoid bananas and pineapple,  And use low carb greek yogurt or original /unsweetened almond or soy milk  ? ? Avoid cereal and bananas, oatmeal and cream of wheat and grits. They are loaded with carbohydrates! ? ? ?10 AM: high protein snack: ? Protein bar by Atkins (the snack size, under 200 cal, usually < 6 net carbs).   ? A stick of cheese:  Around 1 carb,  100 cal    ? Dannon Light n Fit Greek Yogurt  (80 cal, 8 carbs)  ?Other so called "protein bars" and Greek yogurts tend to be loaded with carbohydrates.  Remember, in food advertising, the word "energy" is synonymous for " carbohydrate." ? ?Lunch:  ? A Sandwich using the bread choices listed, Can use any  Eggs,   lunchmeat, grilled meat or canned tuna), avocado, regular mayo/mustard  and cheese. ? A Salad using blue cheese, ranch,  Goddess or vinagrette,  Avoid taco shells, croutons or "confetti" and no "candied nuts" but regular nuts OK.  ? No pretzels, nabs  or chips.  Pickles and miniature sweet peppers are a good low carb alternative that provide a "crunch" ? The bread is the only source of carbohydrate in a sandwich and  can be decreased by trying some of the attached alternatives to traditional loaf bread ?  ?Avoid "Low fat dressings, as well as [place] dressings They are loaded with sugar! ? ? ?3 PM/ Mid day  Snack: ? Consider  1 ounce of  almonds, walnuts, pistachios, pecans, peanuts,  Macadamia nuts or a nut medley. ? Avoid "granola and granola bars " ? Mixed nuts are ok in moderation as long as there are no raisins,  cranberries or dried fruit.  ? KIND bars are OK if you get the low glycemic index variety  ? Try the prosciutto/mozzarella cheese sticks by [employer]  In deli /backery section   High protein  ?  ?  ?6 PM  Dinner:   ?  Meat/fowl/fish with a green salad, and either broccoli, cauliflower, green  beans, spinach, brussel sprouts or  Lima beans. DO NOT BREAD THE PROTEIN!!   ?   There is a low carb pasta by Dreamfield's that is acceptable and tastes great: only 5 digestible carbs/serving.( All grocery stores but BJs carry it ) ?Several ready made meals are available low carb:  ? Try Michel Angelo's chicken piccata or chicken or eggplant parm over low carb pasta.(Lowes and BJs)  ? Marjory Lies Sanchez's "Carnitas" (pulled pork, no sauce,  0 carbs) or his beef pot roast to make a dinner burrito (at [employer]) ? Pesto over low carb pasta (bj's sells a good quality pesto in the center refrigerated section of the deli  ? Try satueeing  Cheral Marker with mushroooms as a good side  ? [employer] Giant makes a mashed cauliflower that tastes like mashed potatoes ? ?Whole wheat pasta is still full of digestible carbs and   Not as low in glycemic index as Dreamfield's.   ?Brown rice is still rice,  So skip the rice and noodles if you eat [place] or [place] (or at least limit to 1/2 cup) ? ?9 PM snack :  ? Breyer's "low carb" fudgsicle or  ice cream bar (Carb Smart line), or  Weight Watcher's ice cream bar , or another "no sugar added" ice cream; ? a serving of fresh berries/cherries with whipped cream  ? Cheese or DANNON'S LlGHT N FIT GREEK YOGURT ? 8 ounces of [place] unsweetened almond/cococunut milk   ? Treat yourself to a parfait made with whipped cream blueberiies, walnuts and vanilla greek yogurt ? ?Avoid bananas, pineapple, grapes  and watermelon on a regular basis because they are high in sugar.  THINK OF THEM AS DESSERT ? ?Remember that snack Substitutions should be less than 10 NET carbs per serving and meals < 20 carbs. Remember to subtract fiber grams to get the "net carbs." ? ? ? ?

## 2022-03-10 NOTE — Progress Notes (Signed)
? ?Subjective:  ? ? Patient ID: Larry Roman., male    DOB: Jul 08, 1946, 76 y.o.   MRN: [id number] ? ?CC: Larry Roman. is a 76 y.o. male who presents today for follow up.  ? ?HPI: Complains of congestion, hoarseness x 2 days, unchanged ?He had been working outside and not sure if related pollen ?Very occasional cough, wheezing . No sob, cp, myalgia, fever.  ?Taking mucinex, bronkaid with some relief ?H/o asthma as child ?No antibiotics in 3 months ? ?History of tonsillectomy ? ? ? ?Hypertension-compliant with losartan '100mg'$ , hctz 12.'5mg'$  MWF , amlodipine '10mg'$ , toprol xl '25mg'$ . No cp, dizziness ? ?DM-compliant with metformin 500 mg twice daily ? ?Depression-compliant with Cymbalta 60 mg, trazodone 50 mg. He feels well on current regimen.  ? ?Hyperlipidemia-compliant with Pravachol 40 mg MWF ? ? ?he continues to follow with Dr. Bernardo Heater, urology for nephrolithiasis, BPH, last seen 11/19/2021 ?Maintained on tamsulosin.  Plan to repeat KUB in 1 year ?HISTORY:  ?Past Medical History:  ?Diagnosis Date  ? Arthritis   ? Depression   ? DM (diabetes mellitus) ([place])   ? Extrinsic asthma, unspecified   ? childhood  ? GERD (gastroesophageal reflux disease)   ? rare  ? History of kidney stones   ? Hives of unknown origin   ? HTN (hypertension)   ? Other allergy, other than to medicinal agents   ? RBBB   ? Sleep apnea   ? 25 years ago mild lost weight no longer uses cpap  ? Wound of right leg 08/2018  ? area just below knee size of quarter, reddened around edges  ? ?Past Surgical History:  ?Procedure Laterality Date  ? [place]  ? ruptured disc encapsulated nerves  ? CATARACT EXTRACTION, BILATERAL  2016  ? [facility]erman [facility]  ? CYSTOSCOPY/URETEROSCOPY/HOLMIUM LASER/STENT PLACEMENT Left 08/24/2021  ? Procedure: CYSTOSCOPY/URETEROSCOPY/HOLMIUM LASER/STENT PLACEMENT;  Surgeon: Abbie Sons, MD;  Location: [facility] OR[facility];  Service: Urology;  Laterality: Left;  ? EYE SURGERY Bilateral   ? cataract  extractions  ? JOINT REPLACEMENT Right   ? total knee  ? TONSILLECTOMY    ? TONSILLECTOMY    ? TOTAL KNEE ARTHROPLASTY Right 09/27/2018  ? Procedure: TOTAL KNEE ARTHROPLASTY;  Surgeon: Thornton Park, MD;  Location: [facility];  Service: Orthopedics;  Laterality: Right;  ? TOTAL KNEE ARTHROPLASTY Left 08/15/2019  ? Procedure: TOTAL KNEE ARTHROPLASTY;  Surgeon: Thornton Park, MD;  Location: [facility];  Service: Orthopedics;  Laterality: Left;  ? ?Family History  ?Problem Relation Age of Onset  ? Heart failure Father   ? Heart attack Mother   ? ? ?Allergies: Ibuprofen ?Current Outpatient Medications on File Prior to Visit  ?Medication Sig Dispense Refill  ? ACCU-CHEK SOFTCLIX LANCETS lancets Use up to 4 times daily to check blood sugar. Diagnosis E11.9 100 each 12  ? acetaminophen (TYLENOL) 500 MG tablet Take 1,500 mg by mouth 2 (two) times daily.    ? amLODipine (NORVASC) 10 MG tablet TAKE 1 TABLET BY MOUTH DAILY WITH LUNCH. 90 tablet 1  ? aspirin 81 MG chewable tablet Chew 81 mg by mouth daily. Every Tuesday and Friday    ? Cyanocobalamin (B-12) 5000 MCG SUBL Place 5,000 mcg under the tongue daily.     ? docusate sodium (COLACE) 100 MG capsule Take 100 mg by mouth daily.    ? doxycycline (VIBRA-TABS) 100 MG tablet Take 1 tablet (100 mg total) by mouth 2 (two) times daily.  20 tablet 0  ? DULoxetine (CYMBALTA) 30 MG capsule TAKE 1 CAPSULE BY MOUTH 2 TIMES DAILY. 180 capsule 2  ? glucose blood (ONETOUCH VERIO) test strip TEST 3 TIMES A DAY 100 each 0  ? loratadine (CLARITIN) 10 MG tablet Take 10 mg by mouth every morning.    ? losartan (COZAAR) 100 MG tablet TAKE 1 TABLET BY MOUTH EVERY DAY 90 tablet 1  ? metFORMIN (GLUCOPHAGE) 500 MG tablet TAKE 1 TABLET BY MOUTH 2 TIMES DAILY WITH A MEAL. 180 tablet 1  ? metoprolol succinate (TOPROL-XL) 25 MG 24 hr tablet TAKE 1 TABLET BY MOUTH EVERY DAY 90 tablet 0  ? tamsulosin (FLOMAX) 0.4 MG CAPS capsule TAKE 2 CAPSULES BY MOUTH EVERY DAY 180 capsule 2  ? traZODone (DESYREL) 50  MG tablet TAKE 1 TABLET BY MOUTH AT BEDTIME AS NEEDED FOR SLEEP 90 tablet 0  ? ?No current facility-administered medications on file prior to visit.  ? ? ?Social History  ? ?Tobacco Use  ? Smoking status: Never  ? Smokeless tobacco: Never  ? Tobacco comments:  ?  tobacco use - no  ?Vaping Use  ? Vaping Use: Never used  ?Substance Use Topics  ? Alcohol use: Yes  ?  Comment: 6  ? Drug use: No  ? ? ?Review of Systems  ?Constitutional:  Negative for chills and fever.  ?HENT:  Positive for congestion and sore throat. Negative for ear discharge, ear pain and sinus pressure.   ?Respiratory:  Positive for wheezing. Negative for cough and shortness of breath.   ?Cardiovascular:  Negative for chest pain and palpitations.  ?Gastrointestinal:  Negative for nausea and vomiting.  ?   ?Objective:  ?  ?BP 128/78 (BP Location: Left Arm, Patient Position: Sitting, Cuff Size: Large)   Pulse 74   Temp (!) 97.3 ?F (36.3 ?C) (Oral)   Ht 6' (1.829 m)   Wt 215 lb 3.2 oz (97.6 kg)   SpO2 96%   BMI 29.19 kg/m?  ?BP Readings from Last 3 Encounters:  ?03/10/22 128/78  ?11/19/21 136/76  ?11/03/21 124/64  ? ?Wt Readings from Last 3 Encounters:  ?03/10/22 215 lb 3.2 oz (97.6 kg)  ?11/19/21 208 lb (94.3 kg)  ?11/03/21 213 lb 4 oz (96.7 kg)  ? ? ?Physical Exam ?Vitals reviewed.  ?Constitutional:   ?   Appearance: He is well-developed.  ?HENT:  ?   Head: Normocephalic and atraumatic.  ?   Right Ear: Hearing, tympanic membrane, ear canal and external ear normal. No decreased hearing noted. No drainage, swelling or tenderness. No middle ear effusion. Tympanic membrane is not injected, erythematous or bulging.  ?   Left Ear: Hearing, tympanic membrane, ear canal and external ear normal. No decreased hearing noted. No drainage, swelling or tenderness.  No middle ear effusion. Tympanic membrane is not injected, erythematous or bulging.  ?   Nose: Nose normal.  ?   Right Sinus: No maxillary sinus tenderness or frontal sinus tenderness.  ?   Left  Sinus: No maxillary sinus tenderness or frontal sinus tenderness.  ?   Mouth/Throat:  ?   Pharynx: Uvula midline. No oropharyngeal exudate or posterior oropharyngeal erythema.  ?   Tonsils: No tonsillar abscesses.  ?Eyes:  ?   Conjunctiva/sclera: Conjunctivae normal.  ?Cardiovascular:  ?   Rate and Rhythm: Regular rhythm.  ?   Heart sounds: Normal heart sounds.  ?Pulmonary:  ?   Effort: Pulmonary effort is normal. No respiratory distress.  ?   Breath sounds:  Normal breath sounds. No wheezing, rhonchi or rales.  ?Lymphadenopathy:  ?   Head:  ?   Right side of head: No submental, submandibular, tonsillar, preauricular, posterior auricular or occipital adenopathy.  ?   Left side of head: No submental, submandibular, tonsillar, preauricular, posterior auricular or occipital adenopathy.  ?   Cervical: No cervical adenopathy.  ?Skin: ?   General: Skin is warm and dry.  ?Neurological:  ?   Mental Status: He is alert.  ?Psychiatric:     ?   Speech: Speech normal.     ?   Behavior: Behavior normal.  ? ? ?   ?Assessment & Plan:  ? ?Problem List Items Addressed This Visit   ? ?  ? Cardiovascular and Mediastinum  ? Hypertension - Primary  ?  Chronic, stable.  Continue losartan 100 mg, amlodipine 10 mg, Toprol 25 mg.  Since he has started hydrochlorothiazide 12.5 mg Monday Wednesday Friday, dizziness has completely resolved.  Continue regimen ?  ?  ? Relevant Medications  ? pravastatin (PRAVACHOL) 40 MG tablet (Start on 03/11/2022)  ? hydrochlorothiazide (MICROZIDE) 12.5 MG capsule (Start on 03/11/2022)  ? Other Relevant Orders  ? POCT HgB A1C (Completed)  ? Microalbumin / creatinine urine ratio  ? Comprehensive metabolic panel  ?  ? Endocrine  ? Diabetes mellitus type 2, controlled ([place])  ?  Lab Results ?Component Value Date ? HGBA1C 6.8 (A) 03/10/2022 ? ?A1c has increased.  Patient declines increasing metformin and prefers to work on improved diet and exercise.  Continue metformin 500 mg twice daily.  Provided him low glycemic  diet ?  ?  ? Relevant Medications  ? pravastatin (PRAVACHOL) 40 MG tablet (Start on 03/11/2022)  ?  ? Other  ? Depression, recurrent ([place])  ?  Chronic, stable.  Continue Cymbalta 60 mg, trazodone 50 mg ?  ?  ? HLD (hyperlipi

## 2022-03-10 NOTE — Assessment & Plan Note (Signed)
Afebrile.  Benign HEENT exam today.  Discussed with patient most likely viral in etiology based on duration of 2 days.  He will continue conservative management at home.  Advised against using Bronkaid as concerned it may raise blood pressure.  Instead I have given him albuterol to use if he feels he is wheezing.  He will continue Mucinex.  He will let me know how he is doing ?

## 2022-03-10 NOTE — Assessment & Plan Note (Signed)
Lab Results ?Component Value Date ? HGBA1C 6.8 (A) 03/10/2022 ? ?A1c has increased.  Patient declines increasing metformin and prefers to work on improved diet and exercise.  Continue metformin 500 mg twice daily.  Provided him low glycemic diet ?

## 2022-03-10 NOTE — Assessment & Plan Note (Signed)
Chronic, stable.  Continue Cymbalta 60 mg, trazodone 50 mg ?

## 2022-03-10 NOTE — Assessment & Plan Note (Signed)
Chronic, stable.  Continue losartan 100 mg, amlodipine 10 mg, Toprol 25 mg.  Since he has started hydrochlorothiazide 12.5 mg Monday Wednesday Friday, dizziness has completely resolved.  Continue regimen ?

## 2022-03-10 NOTE — Assessment & Plan Note (Signed)
Chronic, stable.  Continue Pravachol 40 mg MWF ?

## 2022-03-14 ENCOUNTER — Ambulatory Visit: Payer: PPO | Admitting: Family

## 2022-03-14 ENCOUNTER — Encounter: Payer: Self-pay | Admitting: Family

## 2022-03-15 ENCOUNTER — Other Ambulatory Visit: Payer: Self-pay | Admitting: Family

## 2022-03-15 DIAGNOSIS — J329 Chronic sinusitis, unspecified: Secondary | ICD-10-CM

## 2022-03-15 MED ORDER — AMOXICILLIN-POT CLAVULANATE 875-125 MG PO TABS: 1.0000 | ORAL_TABLET | Freq: Two times a day (BID) | ORAL | 0 refills | Status: AC

## 2022-03-22 NOTE — Telephone Encounter (Signed)
Pt did pick up the Augmentin & is feeling much better. I did apologize for the delay in letting the patient know due to my error of not seeing message. I asked that he call to let us know if he needed anything.  ?

## 2022-03-25 ENCOUNTER — Telehealth: Payer: Self-pay

## 2022-03-25 NOTE — Telephone Encounter (Signed)
LMTCB for lab results.  

## 2022-03-29 ENCOUNTER — Other Ambulatory Visit: Payer: Self-pay

## 2022-03-29 MED ORDER — METFORMIN HCL 500 MG PO TABS: ORAL_TABLET | ORAL | 1 refills | Status: DC

## 2022-03-31 ENCOUNTER — Ambulatory Visit (INDEPENDENT_AMBULATORY_CARE_PROVIDER_SITE_OTHER): Payer: PPO

## 2022-03-31 VITALS — Ht 72.0 in | Wt 215.0 lb

## 2022-03-31 DIAGNOSIS — Z0000 Encounter for general adult medical examination without abnormal findings: Secondary | ICD-10-CM

## 2022-03-31 NOTE — Progress Notes (Signed)
Subjective:   Larry Crocco. is a 76 y.o. male who presents for Medicare Annual/Subsequent preventive examination.  Review of Systems    No ROS.  Medicare Wellness Virtual Visit.  Visual/audio telehealth visit, UTA vital signs.   See social history for additional risk factors.   Cardiac Risk Factors include: advanced age (>63men, >27 women);diabetes mellitus;male gender     Objective:    Today's Vitals   03/31/22 0919  Weight: 215 lb (97.5 kg)  Height: 6' (1.829 m)   Body mass index is 29.16 kg/m.     03/31/2022    9:22 AM 08/24/2021    6:32 AM 08/20/2021   11:22 AM 01/11/2021   11:16 AM 08/15/2019    2:00 PM 08/15/2019    6:50 AM 08/08/2019    8:26 AM  Advanced Directives  Does Patient Have a Medical Advance Directive? Yes No Yes Yes Yes Yes Yes  Type of Estate agent of [place];Living will   Healthcare Power of [place];Living will Healthcare Power of [place];Living will Healthcare Power of [place];Living will Healthcare Power of [place];Living will  Does patient want to make changes to medical advance directive? No - Patient declined   No - Patient declined No - Patient declined No - Patient declined   Copy of Healthcare Power of Attorney in Chart? No - copy requested   No - copy requested No - copy requested No - copy requested No - copy requested  Would patient like information on creating a medical advance directive?  No - Patient declined         Current Medications (verified) Outpatient Encounter Medications as of 03/31/2022  Medication Sig   ACCU-CHEK SOFTCLIX LANCETS lancets Use up to 4 times daily to check blood sugar. Diagnosis E11.9   acetaminophen (TYLENOL) 500 MG tablet Take 1,500 mg by mouth 2 (two) times daily.   albuterol (VENTOLIN HFA) 108 (90 Base) MCG/ACT inhaler Inhale 2 puffs into the lungs every 6 (six) hours as needed for wheezing or shortness of breath.   amLODipine (NORVASC) 10 MG tablet TAKE 1 TABLET BY MOUTH DAILY WITH  LUNCH.   aspirin 81 MG chewable tablet Chew 81 mg by mouth daily. Every Tuesday and Friday   azelastine (ASTELIN) 0.1 % nasal spray Place 1 spray into both nostrils 2 (two) times daily. Use in each nostril as directed   Cyanocobalamin (B-12) 5000 MCG SUBL Place 5,000 mcg under the tongue daily.    docusate sodium (COLACE) 100 MG capsule Take 100 mg by mouth daily.   DULoxetine (CYMBALTA) 30 MG capsule TAKE 1 CAPSULE BY MOUTH 2 TIMES DAILY.   fluticasone (FLONASE) 50 MCG/ACT nasal spray SPRAY 2 SPRAYS INTO EACH NOSTRIL EVERY DAY   glucose blood (ONETOUCH VERIO) test strip TEST 3 TIMES A DAY   hydrochlorothiazide (MICROZIDE) 12.5 MG capsule Take 1 capsule (12.5 mg total) by mouth every Monday, Wednesday, and Friday.   loratadine (CLARITIN) 10 MG tablet Take 10 mg by mouth every morning.   losartan (COZAAR) 100 MG tablet TAKE 1 TABLET BY MOUTH EVERY DAY   metFORMIN (GLUCOPHAGE) 500 MG tablet TAKE TABLET BY MOUTH EVERY MORNING WITH A MEAL & TWO TABLETS BY MOUTH EVERY EVENING WITH A MEAL..   metoprolol succinate (TOPROL-XL) 25 MG 24 hr tablet TAKE 1 TABLET BY MOUTH EVERY DAY   pravastatin (PRAVACHOL) 40 MG tablet Take 1 tablet (40 mg total) by mouth every Monday, Wednesday, and Friday.   tamsulosin (FLOMAX) 0.4 MG CAPS capsule TAKE 2  CAPSULES BY MOUTH EVERY DAY   traZODone (DESYREL) 50 MG tablet TAKE 1 TABLET BY MOUTH AT BEDTIME AS NEEDED FOR SLEEP   No facility-administered encounter medications on file as of 03/31/2022.    Allergies (verified) Ibuprofen   History: Past Medical History:  Diagnosis Date   Arthritis    Depression    DM (diabetes mellitus) (HCC)    Extrinsic asthma, unspecified    childhood   GERD (gastroesophageal reflux disease)    rare   History of kidney stones    Hives of unknown origin    HTN (hypertension)    Other allergy, other than to medicinal agents    RBBB    Sleep apnea    25 years ago mild lost weight no longer uses cpap   Wound of right leg 08/2018    area just below knee size of quarter, reddened around edges   Past Surgical History:  Procedure Laterality Date   BACK SURGERY  1995   ruptured disc encapsulated nerves   CATARACT EXTRACTION, BILATERAL  2016   Dr. Bryon Lions at [facility]   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   Procedure: TOTAL KNEE ARTHROPLASTY;  Surgeon: Juanell Fairly, MD;  Location: [facility] ORS;  Service: Orthopedics;  Laterality: Right;   TOTAL KNEE ARTHROPLASTY Left 08/15/2019   Procedure: TOTAL KNEE ARTHROPLASTY;  Surgeon: Juanell Fairly, MD;  Location: [facility] ORS;  Service: Orthopedics;  Laterality: Left;   Family History  Problem Relation Age of Onset   Heart failure Father    Heart attack Mother    Social History   Socioeconomic History   Marital status: Married    Spouse name: Pam   Number of children: 2   Years of education: Not on file   Highest education level: Not on file  Occupational History    Employer: [employer]    Comment: retired  Tobacco Use   Smoking status: Never   Smokeless tobacco: Never   Tobacco comments:    tobacco use - no  Vaping Use   Vaping Use: Never used  Substance and Sexual Activity   Alcohol use: Yes    Comment: 6   Drug use: No   Sexual activity: Yes  Other Topics Concern   Not on file  Social History Narrative   Not on file   Social Determinants of Health   Financial Resource Strain: Low Risk    Difficulty of Paying Living Expenses: Not hard at all  Food Insecurity: No Food Insecurity   Worried About Programme researcher, broadcasting/film/video in the Last Year: Never true   Ran Out of Food in the Last Year:  Never true  Transportation Needs: No Transportation Needs   Lack of Transportation (Medical): No   Lack of Transportation (Non-Medical): No  Physical Activity: Insufficiently Active   Days of Exercise per Week: 3 days   Minutes of Exercise per Session: 30 min  Stress: No Stress Concern Present   Feeling of Stress : Not at all  Social Connections: Unknown   Frequency of Communication with Friends and Family: Not on file   Frequency of Social Gatherings with Friends and Family:  Not on file   Attends Religious Services: Not on file   Active Member of Clubs or Organizations: Not on file   Attends Club or Organization Meetings: Not on file   Marital Status: Married    Tobacco Counseling Counseling given: Not Answered Tobacco comments: tobacco use - no   Clinical Intake:  Pre-visit preparation completed: Yes        Diabetes: Yes (Followed by PCP)  How often do you need to have someone help you when you read instructions, pamphlets, or other written materials from your doctor or pharmacy?: 1 - Never   Interpreter Needed?: No    Activities of Daily Living    03/31/2022    9:24 AM 08/20/2021   11:10 AM  In your present state of health, do you have any difficulty performing the following activities:  Hearing? 0   Vision? 0   Difficulty concentrating or making decisions? 0   Walking or climbing stairs? 0   Dressing or bathing? 0   Doing errands, shopping? 0 0  Preparing Food and eating ? N   Using the Toilet? N   In the past six months, have you accidently leaked urine? N   Do you have problems with loss of bowel control? N   Managing your Medications? N   Managing your Finances? N   Housekeeping or managing your Housekeeping? N     Patient Care Team: Allegra Grana, FNP as PCP - General (Family Medicine)  Indicate any recent Medical Services you may have received from other than [facility] providers in the past year (date may be approximate).     Assessment:    This is a routine wellness examination for [facility].  Virtual Visit via Telephone Note  I connected with  Larry Roman. on 03/31/22 at  9:15 AM EDT by telephone and verified that I am speaking with the correct person using two identifiers.  Persons participating in the virtual visit: patient/Nurse Health Advisor   I discussed the limitations of performing an evaluation and management service by telehealth. The patient expressed understanding and agreed to proceed. We continued and completed visit with audio only. Some vital signs may be absent or patient reported.   Hearing/Vision screen Hearing Screening - Comments:: Patient is able to hear conversational tones without difficulty. No issues reported. Vision Screening - Comments:: Followed by [facility] Wears corrective lenses when reading Annual visits Cataract extraction, bilateral  They have regular follow up with the ophthalmologist.   Dietary issues and exercise activities discussed: Current Exercise Habits: Home exercise routine, Type of exercise: walking, Time (Minutes): 30, Frequency (Times/Week): 3, Weekly Exercise (Minutes/Week): 90, Intensity: Mild Low carb diet Good water intake   Goals Addressed               This Visit's Progress     Patient Stated     I want to keep my blood sugars and blood pressure down (pt-stated)        Reduce sugar intake Walk for exercise Monitor diet closely       Depression Screen    03/31/2022    9:21 AM 03/10/2022   10:12 AM 11/03/2021    9:46 AM 09/13/2021   11:15 AM 01/11/2021   11:12 AM 12/21/2020    8:56 AM 08/07/2019   11:21 AM  PHQ 2/9 Scores  PHQ - 2 Score 0 0 0 0 0 0 1  PHQ- 9 Score 0 0 0 0   2  Fall Risk    03/31/2022    9:24 AM 11/03/2021    9:45 AM 01/11/2021   11:22 AM 09/21/2020   10:16 AM 06/30/2020    1:14 PM  Fall Risk   Falls in the past year? 0 0 0 0 0  Comment     Emmi Telephone Survey: data to providers prior to load  Number falls in past  yr: 0 0 0    Injury with Fall?  0 0    Risk for fall due to :  No Fall Risks     Follow up Falls evaluation completed Falls evaluation completed Falls evaluation completed Falls evaluation completed     FALL RISK PREVENTION PERTAINING TO THE HOME: Home free of loose throw rugs in walkways, pet beds, electrical cords, etc? Yes  Adequate lighting in your home to reduce risk of falls? Yes   ASSISTIVE DEVICES UTILIZED TO PREVENT FALLS: Life alert? No  Use of a cane, walker or w/c? No   TIMED UP AND GO: Was the test performed? No .   Cognitive Function:    04/25/2018    2:39 PM 04/24/2017    2:31 PM 04/22/2016    2:15 PM  MMSE - Mini Mental State Exam  Orientation to time 5 5 5   Orientation to Place 5 5 5   Registration 3 3 3   Attention/ Calculation 5 5 5   Recall 3 3 3   Language- name 2 objects 2 2 2   Language- repeat 1 1 1   Language- follow 3 step command 3 3 3   Language- read & follow direction 1 1 1   Write a sentence 1 1 1   Copy design 1 1 1   Total score 30 30 30         Immunizations Immunization History  Administered Date(s) Administered   Fluad Quad(high Dose 65+) 10/30/2019, 09/21/2020, 09/13/2021   Influenza Split 11/10/2011, 09/10/2012   Influenza, High Dose Seasonal PF 09/11/2017, 09/20/2018   Influenza,inj,Quad PF,6+ Mos 10/02/2013, 08/30/2014, 09/14/2015, 08/25/2016   PFIZER(Purple Top)SARS-COV-2 Vaccination 02/05/2020, 02/26/2020, 10/25/2020   Pfizer Covid-19 Vaccine Bivalent Booster 78yrs & up 12/19/2021   Pneumococcal Conjugate-13 01/14/2014   Pneumococcal Polysaccharide-23 07/16/2012, 06/25/2019   Tdap 11/10/2011   Varicella 11/16/2011   Zoster, Live 12/01/2011    TDAP status: Due, Education has been provided regarding the importance of this vaccine. Advised may receive this vaccine at local pharmacy or Health Dept. Aware to provide a copy of the vaccination record if obtained from local pharmacy or Health Dept. Verbalized acceptance and  understanding.  Shingrix Completed?: No.    Education has been provided regarding the importance of this vaccine. Patient has been advised to call insurance company to determine out of pocket expense if they have not yet received this vaccine. Advised may also receive vaccine at local pharmacy or Health Dept. Verbalized acceptance and understanding.  Screening Tests Health Maintenance  Topic Date Due   FOOT EXAM  03/31/2022   Zoster Vaccines- Shingrix (1 of 2) 06/30/2022 (Originally [birth year]/1997)   TETANUS/TDAP  04/01/2023 (Originally 11/09/2021)   INFLUENZA VACCINE  07/05/2022   HEMOGLOBIN A1C  09/09/2022   OPHTHALMOLOGY EXAM  10/01/2022   Pneumonia Vaccine 44+ Years old  Completed   COVID-19 Vaccine  Completed   HPV VACCINES  Aged Out   COLONOSCOPY (Pts 45-32yrs Insurance coverage will need to be confirmed)  Discontinued   Hepatitis C Screening  Discontinued   Health Maintenance Health Maintenance Due  Topic Date Due   FOOT EXAM  03/31/2022  Lung Cancer Screening: (Low Dose CT Chest recommended if Age 24-80 years, 30 pack-year currently smoking OR have quit w/in 15years.) does not qualify.   Vision Screening: Recommended annual ophthalmology exams for early detection of glaucoma and other disorders of the eye.  Dental Screening: Recommended annual dental exams for proper oral hygiene  Community Resource Referral / Chronic Care Management: CRR required this visit?  No   CCM required this visit?  No      Plan:   Keep all routine maintenance appointments.   Medication-  Taking Metformin differently. Monitoring diet closer and increasing activity. FBS today 133.   I have personally reviewed and noted the following in the patient's chart:   Medical and social history Use of alcohol, tobacco or illicit drugs  Current medications and supplements including opioid prescriptions. Patient is not currently taking opioid prescriptions. Functional ability and status Nutritional  status Physical activity Advanced directives List of other physicians Hospitalizations, surgeries, and ER visits in previous 12 months Vitals Screenings to include cognitive, depression, and falls Referrals and appointments  In addition, I have reviewed and discussed with patient certain preventive protocols, quality metrics, and best practice recommendations. A written personalized care plan for preventive services as well as general preventive health recommendations were provided to patient.     Ashok Pall, LPN   1/61/0960

## 2022-03-31 NOTE — Patient Instructions (Addendum)
?  Larry Roman , ?Thank you for taking time to come for your Medicare Wellness Visit. I appreciate your ongoing commitment to your health goals. Please review the following plan we discussed and let me know if I can assist you in the future.  ? ?These are the goals we discussed: ? Goals   ? ?  ? Patient Stated  ?   I want to keep my blood sugars and blood pressure down (pt-stated)   ?   Reduce sugar intake ?Walk for exercise ?Monitor diet closely ?  ?  ? Other  ?   increase lean proteins   ?   Reduce fast food intake  ?Low carb foods ? ?  ? ?  ?  ?This is a list of the screening recommended for you and due dates:  ?Health Maintenance  ?Topic Date Due  ? Complete foot exam   03/31/2022  ? Zoster (Shingles) Vaccine (1 of 2) 06/30/2022*  ? Tetanus Vaccine  04/01/2023*  ? Flu Shot  07/05/2022  ? Hemoglobin A1C  09/09/2022  ? Eye exam for diabetics  10/01/2022  ? Pneumonia Vaccine  Completed  ? COVID-19 Vaccine  Completed  ? HPV Vaccine  Aged Out  ? Colon Cancer Screening  Discontinued  ? Hepatitis C Screening: USPSTF Recommendation to screen - Ages 62-79 yo.  Discontinued  ?*Topic was postponed. The date shown is not the original due date.  ?  ?

## 2022-05-26 ENCOUNTER — Other Ambulatory Visit: Payer: Self-pay | Admitting: Family

## 2022-05-26 DIAGNOSIS — F339 Major depressive disorder, recurrent, unspecified: Secondary | ICD-10-CM

## 2022-05-26 DIAGNOSIS — D225 Melanocytic nevi of trunk: Secondary | ICD-10-CM | POA: Diagnosis not present

## 2022-05-26 DIAGNOSIS — L609 Nail disorder, unspecified: Secondary | ICD-10-CM | POA: Diagnosis not present

## 2022-05-26 DIAGNOSIS — L821 Other seborrheic keratosis: Secondary | ICD-10-CM | POA: Diagnosis not present

## 2022-05-26 DIAGNOSIS — D2261 Melanocytic nevi of right upper limb, including shoulder: Secondary | ICD-10-CM | POA: Diagnosis not present

## 2022-05-26 DIAGNOSIS — D2262 Melanocytic nevi of left upper limb, including shoulder: Secondary | ICD-10-CM | POA: Diagnosis not present

## 2022-05-26 DIAGNOSIS — D2272 Melanocytic nevi of left lower limb, including hip: Secondary | ICD-10-CM | POA: Diagnosis not present

## 2022-05-26 DIAGNOSIS — S40261A Insect bite (nonvenomous) of right shoulder, initial encounter: Secondary | ICD-10-CM | POA: Diagnosis not present

## 2022-05-26 DIAGNOSIS — S70361A Insect bite (nonvenomous), right thigh, initial encounter: Secondary | ICD-10-CM | POA: Diagnosis not present

## 2022-05-26 DIAGNOSIS — D2271 Melanocytic nevi of right lower limb, including hip: Secondary | ICD-10-CM | POA: Diagnosis not present

## 2022-05-30 ENCOUNTER — Other Ambulatory Visit: Payer: Self-pay

## 2022-05-30 DIAGNOSIS — F339 Major depressive disorder, recurrent, unspecified: Secondary | ICD-10-CM

## 2022-05-30 MED ORDER — TRAZODONE HCL 50 MG PO TABS: 50.0000 mg | ORAL_TABLET | Freq: Every evening | ORAL | 1 refills | Status: DC | PRN

## 2022-06-01 DIAGNOSIS — M5136 Other intervertebral disc degeneration, lumbar region: Secondary | ICD-10-CM | POA: Diagnosis not present

## 2022-06-01 DIAGNOSIS — M5416 Radiculopathy, lumbar region: Secondary | ICD-10-CM | POA: Diagnosis not present

## 2022-06-01 DIAGNOSIS — M9903 Segmental and somatic dysfunction of lumbar region: Secondary | ICD-10-CM | POA: Diagnosis not present

## 2022-06-01 DIAGNOSIS — M62830 Muscle spasm of back: Secondary | ICD-10-CM | POA: Diagnosis not present

## 2022-06-02 DIAGNOSIS — M5136 Other intervertebral disc degeneration, lumbar region: Secondary | ICD-10-CM | POA: Diagnosis not present

## 2022-06-02 DIAGNOSIS — M5416 Radiculopathy, lumbar region: Secondary | ICD-10-CM | POA: Diagnosis not present

## 2022-06-02 DIAGNOSIS — M9903 Segmental and somatic dysfunction of lumbar region: Secondary | ICD-10-CM | POA: Diagnosis not present

## 2022-06-02 DIAGNOSIS — M62830 Muscle spasm of back: Secondary | ICD-10-CM | POA: Diagnosis not present

## 2022-06-06 DIAGNOSIS — M9903 Segmental and somatic dysfunction of lumbar region: Secondary | ICD-10-CM | POA: Diagnosis not present

## 2022-06-06 DIAGNOSIS — M62830 Muscle spasm of back: Secondary | ICD-10-CM | POA: Diagnosis not present

## 2022-06-06 DIAGNOSIS — M5136 Other intervertebral disc degeneration, lumbar region: Secondary | ICD-10-CM | POA: Diagnosis not present

## 2022-06-06 DIAGNOSIS — M5416 Radiculopathy, lumbar region: Secondary | ICD-10-CM | POA: Diagnosis not present

## 2022-06-10 ENCOUNTER — Ambulatory Visit (INDEPENDENT_AMBULATORY_CARE_PROVIDER_SITE_OTHER): Payer: PPO | Admitting: Family

## 2022-06-10 ENCOUNTER — Encounter: Payer: Self-pay | Admitting: Family

## 2022-06-10 VITALS — BP 126/68 | HR 72 | Temp 97.6°F | Ht 72.0 in | Wt 207.4 lb

## 2022-06-10 DIAGNOSIS — R519 Headache, unspecified: Secondary | ICD-10-CM | POA: Diagnosis not present

## 2022-06-10 DIAGNOSIS — E119 Type 2 diabetes mellitus without complications: Secondary | ICD-10-CM

## 2022-06-10 DIAGNOSIS — I10 Essential (primary) hypertension: Secondary | ICD-10-CM | POA: Diagnosis not present

## 2022-06-10 LAB — POCT GLYCOSYLATED HEMOGLOBIN (HGB A1C): Hemoglobin A1C: 5.8 % — AB (ref 4.0–5.6)

## 2022-06-10 LAB — MICROALBUMIN / CREATININE URINE RATIO
Creatinine,U: 171.8 mg/dL
Microalb Creat Ratio: 9.4 mg/g (ref 0.0–30.0)
Microalb, Ur: 16.2 mg/dL — ABNORMAL HIGH (ref 0.0–1.9)

## 2022-06-10 NOTE — Assessment & Plan Note (Signed)
Chronic, stable. Continue  amlodipine 10 mg, hydrochlorothiazide 12.5 mg, losartan 100 mg, Toprol 25 mg

## 2022-06-10 NOTE — Progress Notes (Signed)
Subjective:    Patient ID: Larry Ramus., male    DOB: 09-Jan-1946, 76 y.o.   MRN: [id number]  CC: Larry Roman. is a 76 y.o. male who presents today for follow up.   HPI: Complains of headache 'for years', more noticeable past couple of years. HA remind him of prior HAs. HA once every 5-6 weeks. HA dull ache bilateral temporal area.  Not worse HA of life. Using cool compress and 2 Excredin with some relief. HA will last one dayand then resolves on its own. Sleep helps HA.  No particular trigger or time of day.  No associated sinus congestion, vision changes, vision loss, vomiting, nauseated. Some photophobia HA not worse with pressure such as cough, Valsalva HA is not positional or awakens him from sleep.   Drinks 2 cups coffee daily H/o osa and doesn't wear cipap. No fatigue.     Hypertension-compliant with amlodipine 10 mg, hydrochlorothiazide 12.5 mg, losartan 100 mg, Toprol 25 mg . BP at home 136/75.   DM-compliant with metformin '1500mg'$  qd HISTORY:  Past Medical History:  Diagnosis Date   Arthritis    Depression    DM (diabetes mellitus) ([place])    Extrinsic asthma, unspecified    childhood   GERD (gastroesophageal reflux disease)    rare   History of kidney stones    Hives of unknown origin    HTN (hypertension)    Other allergy, other than to medicinal agents    RBBB    Sleep apnea    25 years ago mild lost weight no longer uses cpap   Wound of right leg 08/2018   area just below knee size of quarter, reddened around edges   Past Surgical History:  Procedure Laterality Date   BACK SURGERY  1995   ruptured disc encapsulated nerves   CATARACT EXTRACTION, BILATERAL  2016   Dr. Kerman [facility]sey at [facility]   CYSTOSCOPY/URETEROSCOPY/HOLMIUM LASER/STENT PLACEMENT Left 08/24/2021   Procedure: CYSTOSCOPY/URETEROSCOPY/HOLMIUM LASER/STENT PLACEMENT;  Surgeon: Abbie Sons, MD;  Location: [facility] ORS;  Service: Urology;  Laterality: Left;   EYE SURGERY Bilateral     cataract extractions   JOINT REPLACEMENT Right    total knee   TONSILLECTOMY     TONSILLECTOMY     TOTAL KNEE ARTHROPLASTY Right 09/27/2018   Procedure: TOTAL KNEE ARTHROPLASTY;  Surgeon: Thornton Park, MD;  Location: [facility] ORS;  Service: Orthopedics;  Laterality: Right;   TOTAL KNEE ARTHROPLASTY Left 08/15/2019   Procedure: TOTAL KNEE ARTHROPLASTY;  Surgeon: Thornton Park, MD;  Location: [facility] OR[facility];  Service: Orthopedics;  Laterality: Left;   Family History  Problem Relation Age of Onset   Heart failure Father    Heart attack Mother     Allergies: Ibuprofen Current Outpatient Medications on File Prior to Visit  Medication Sig Dispense Refill   ACCU-CHEK SOFTCLIX LANCETS lancets Use up to 4 times daily to check blood sugar. Diagnosis E11.9 100 each 12   acetaminophen (TYLENOL) 500 MG tablet Take 1,500 mg by mouth 2 (two) times daily.     albuterol (VENTOLIN HFA) 108 (90 Base) MCG/ACT inhaler Inhale 2 puffs into the lungs every 6 (six) hours as needed for wheezing or shortness of breath. 8 g 0   amLODipine (NORVASC) 10 MG tablet TAKE 1 TABLET BY MOUTH DAILY WITH LUNCH. 90 tablet 1   aspirin 81 MG chewable tablet Chew 81 mg by mouth daily. Every Tuesday and Friday     azelastine (ASTELIN) 0.1 % nasal  spray Place 1 spray into both nostrils 2 (two) times daily. Use in each nostril as directed 30 mL 4   Cyanocobalamin (B-12) 5000 MCG SUBL Place 5,000 mcg under the tongue daily.      docusate sodium (COLACE) 100 MG capsule Take 100 mg by mouth daily.     DULoxetine (CYMBALTA) 30 MG capsule TAKE 1 CAPSULE BY MOUTH 2 TIMES DAILY. 180 capsule 2   fluticasone (FLONASE) 50 MCG/ACT nasal spray SPRAY 2 SPRAYS INTO EACH NOSTRIL EVERY DAY 48 mL 1   glucose blood (ONETOUCH VERIO) test strip TEST 3 TIMES A DAY 100 each 0   hydrochlorothiazide (MICROZIDE) 12.5 MG capsule Take 1 capsule (12.5 mg total) by mouth every Monday, Wednesday, and Friday. 90 capsule 1   loratadine (CLARITIN) 10 MG tablet  Take 10 mg by mouth every morning.     losartan (COZAAR) 100 MG tablet TAKE 1 TABLET BY MOUTH EVERY DAY 90 tablet 1   metFORMIN (GLUCOPHAGE) 500 MG tablet TAKE TABLET BY MOUTH EVERY MORNING WITH A MEAL & TWO TABLETS BY MOUTH EVERY EVENING WITH A MEAL.. 270 tablet 1   metoprolol succinate (TOPROL-XL) 25 MG 24 hr tablet TAKE 1 TABLET BY MOUTH EVERY DAY 90 tablet 0   pravastatin (PRAVACHOL) 40 MG tablet Take 1 tablet (40 mg total) by mouth every Monday, Wednesday, and Friday. 36 tablet 3   tamsulosin (FLOMAX) 0.4 MG CAPS capsule TAKE 2 CAPSULES BY MOUTH EVERY DAY 180 capsule 2   traZODone (DESYREL) 50 MG tablet Take 1 tablet (50 mg total) by mouth at bedtime as needed. for sleep 90 tablet 1   No current facility-administered medications on file prior to visit.    Social History   Tobacco Use   Smoking status: Never   Smokeless tobacco: Never   Tobacco comments:    tobacco use - no  Vaping Use   Vaping Use: Never used  Substance Use Topics   Alcohol use: Yes    Comment: 6   Drug use: No    Review of Systems  Constitutional:  Negative for chills and fever.  Eyes:  Negative for visual disturbance.  Respiratory:  Negative for cough.   Cardiovascular:  Negative for chest pain and palpitations.  Gastrointestinal:  Negative for nausea and vomiting.  Neurological:  Positive for headaches.      Objective:    BP 126/68 (BP Location: Left Arm, Patient Position: Sitting, Cuff Size: Normal)   Pulse 72   Temp 97.6 F (36.4 C) (Oral)   Ht 6' (1.829 m)   Wt 207 lb 6.4 oz (94.1 kg)   SpO2 95%   BMI 28.13 kg/m  BP Readings from Last 3 Encounters:  06/10/22 126/68  03/10/22 128/78  11/19/21 136/76   Wt Readings from Last 3 Encounters:  06/10/22 207 lb 6.4 oz (94.1 kg)  03/31/22 215 lb (97.5 kg)  03/10/22 215 lb 3.2 oz (97.6 kg)    Physical Exam Vitals reviewed.  Constitutional:      Appearance: Larry Roman is well-developed.  HENT:     Right Ear: Hearing normal.     Left Ear: Hearing  normal.     Mouth/Throat:     Pharynx: Uvula midline. No posterior oropharyngeal erythema.  Eyes:     General: Lids are normal. Lids are everted, no foreign bodies appreciated.     Conjunctiva/sclera: Conjunctivae normal.     Pupils: Pupils are equal, round, and reactive to light.     Comments: Normal fundus bilaterally.  Cardiovascular:  Rate and Rhythm: Regular rhythm.     Heart sounds: Normal heart sounds.  Pulmonary:     Effort: Pulmonary effort is normal. No respiratory distress.     Breath sounds: Normal breath sounds. No wheezing, rhonchi or rales.  Lymphadenopathy:     Head:     Right side of head: No submental, submandibular, tonsillar, preauricular, posterior auricular or occipital adenopathy.     Left side of head: No submental, submandibular, tonsillar, preauricular, posterior auricular or occipital adenopathy.     Cervical: No cervical adenopathy.  Skin:    General: Skin is warm and dry.  Neurological:     Mental Status: Larry Roman is alert.     Cranial Nerves: No cranial nerve deficit.     Sensory: No sensory deficit.     Deep Tendon Reflexes:     Reflex Scores:      Bicep reflexes are 2+ on the right side and 2+ on the left side.      Patellar reflexes are 2+ on the right side and 2+ on the left side.    Comments: Grip equal and strong bilateral upper extremities. Gait strong and steady. Able to perform rapid alternating movement without difficulty.  Psychiatric:        Speech: Speech normal.        Behavior: Behavior normal.        Assessment & Plan:   Problem List Items Addressed This Visit       Cardiovascular and Mediastinum   Hypertension    Chronic, stable. Continue  amlodipine 10 mg, hydrochlorothiazide 12.5 mg, losartan 100 mg, Toprol 25 mg         Endocrine   Diabetes mellitus type 2, controlled ([place]) - Primary    Excellent control and improvement. Continue metformin '1500mg'$  /day.       Relevant Orders   POCT HgB A1C (Completed)   Microalbumin  / creatinine urine ratio   Basic metabolic panel     Other   Headache    Reassuring neurologic exam. HA similar to previous HAs. No alarm features at this time. We discussed cymbalta associated with headache (13% to 14%) and agreed trial decrease cymbalta to '30mg'$ . Intolerate to cipap and dental appliance. Discussed role of untreated osa and HA. Larry Roman declines re evaluation for OSA at this time. Advised that Larry Roman may use tylenol '500mg'$  for rescue. Advised to avoid NSAIDs due to medical renal disease and HTN. Larry Roman politely declines neuroimaging at this time. Larry Roman will keep a HA journal. Close follow up        I am having Larry Ramus. maintain his loratadine, B-12, Accu-Chek Softclix Lancets, glucose blood, docusate sodium, acetaminophen, aspirin, tamsulosin, DULoxetine, metoprolol succinate, amLODipine, losartan, azelastine, fluticasone, albuterol, pravastatin, hydrochlorothiazide, metFORMIN, and traZODone.   No orders of the defined types were placed in this encounter.   Return precautions given.   Risks, benefits, and alternatives of the medications and treatment plan prescribed today were discussed, and patient expressed understanding.   Education regarding symptom management and diagnosis given to patient on AVS.  Continue to follow with Burnard Hawthorne, FNP for routine health maintenance.   [place] agreed with plan.   Mable Paris, FNP

## 2022-06-10 NOTE — Patient Instructions (Addendum)
   For headache, you may use extra strength tylenol.  Trial decrease cymbalta to '30mg'$  and let me know if headache resolves.   As discussed, please let me know if headache were to increase in frequency or severity in any way or you have any new concerns.  Please keep headache diary

## 2022-06-10 NOTE — Assessment & Plan Note (Addendum)
Reassuring neurologic exam. HA similar to previous HAs. No alarm features at this time. We discussed cymbalta associated with headache (13% to 14%) and agreed trial decrease cymbalta to '30mg'$ . Intolerate to cipap and dental appliance. Discussed role of untreated osa and HA. He declines re evaluation for OSA at this time. Advised that he may use tylenol '500mg'$  for rescue. Advised to avoid NSAIDs due to medical renal disease and HTN. He politely declines neuroimaging at this time. He will keep a HA journal. Close follow up

## 2022-06-10 NOTE — Assessment & Plan Note (Signed)
Excellent control and improvement. Continue metformin '1500mg'$  /day.

## 2022-06-14 DIAGNOSIS — M9903 Segmental and somatic dysfunction of lumbar region: Secondary | ICD-10-CM | POA: Diagnosis not present

## 2022-06-14 DIAGNOSIS — M5136 Other intervertebral disc degeneration, lumbar region: Secondary | ICD-10-CM | POA: Diagnosis not present

## 2022-06-14 DIAGNOSIS — M5416 Radiculopathy, lumbar region: Secondary | ICD-10-CM | POA: Diagnosis not present

## 2022-06-14 DIAGNOSIS — M62830 Muscle spasm of back: Secondary | ICD-10-CM | POA: Diagnosis not present

## 2022-06-18 ENCOUNTER — Other Ambulatory Visit: Payer: Self-pay | Admitting: Family

## 2022-06-18 DIAGNOSIS — E785 Hyperlipidemia, unspecified: Secondary | ICD-10-CM

## 2022-06-30 DIAGNOSIS — M5416 Radiculopathy, lumbar region: Secondary | ICD-10-CM | POA: Diagnosis not present

## 2022-06-30 DIAGNOSIS — M9903 Segmental and somatic dysfunction of lumbar region: Secondary | ICD-10-CM | POA: Diagnosis not present

## 2022-06-30 DIAGNOSIS — M5136 Other intervertebral disc degeneration, lumbar region: Secondary | ICD-10-CM | POA: Diagnosis not present

## 2022-06-30 DIAGNOSIS — M62830 Muscle spasm of back: Secondary | ICD-10-CM | POA: Diagnosis not present

## 2022-07-26 DIAGNOSIS — M62830 Muscle spasm of back: Secondary | ICD-10-CM | POA: Diagnosis not present

## 2022-07-26 DIAGNOSIS — M5136 Other intervertebral disc degeneration, lumbar region: Secondary | ICD-10-CM | POA: Diagnosis not present

## 2022-07-26 DIAGNOSIS — M9903 Segmental and somatic dysfunction of lumbar region: Secondary | ICD-10-CM | POA: Diagnosis not present

## 2022-07-26 DIAGNOSIS — M5416 Radiculopathy, lumbar region: Secondary | ICD-10-CM | POA: Diagnosis not present

## 2022-08-03 ENCOUNTER — Telehealth: Payer: Self-pay

## 2022-08-03 NOTE — Telephone Encounter (Signed)
Patient states he is having trouble getting a prior authorization to his pharmacy from [place] for his metoprolol succinate (TOPROL-XL) 25 MG 24 hr tablet.  Patient states he has about one week of this medication left.  Patient states he would like for this to be a 90-day prescription.  *Patient states his preferred pharmacy is [facility] on [address] in [place] (not in [employer]).

## 2022-08-04 ENCOUNTER — Other Ambulatory Visit: Payer: Self-pay

## 2022-08-04 DIAGNOSIS — I10 Essential (primary) hypertension: Secondary | ICD-10-CM

## 2022-08-04 MED ORDER — METOPROLOL SUCCINATE ER 25 MG PO TB24: 25.0000 mg | ORAL_TABLET | Freq: Every day | ORAL | 3 refills | Status: DC

## 2022-08-04 NOTE — Telephone Encounter (Signed)
LVM to inform patient that RX for metoprolol sent in to pharmacy.

## 2022-08-06 ENCOUNTER — Other Ambulatory Visit: Payer: Self-pay | Admitting: Urology

## 2022-08-06 DIAGNOSIS — N400 Enlarged prostate without lower urinary tract symptoms: Secondary | ICD-10-CM

## 2022-08-16 ENCOUNTER — Other Ambulatory Visit: Payer: Self-pay

## 2022-08-16 MED ORDER — LOSARTAN POTASSIUM 100 MG PO TABS: 100.0000 mg | ORAL_TABLET | Freq: Every day | ORAL | 3 refills | Status: DC

## 2022-08-18 ENCOUNTER — Other Ambulatory Visit: Payer: Self-pay

## 2022-08-18 DIAGNOSIS — I10 Essential (primary) hypertension: Secondary | ICD-10-CM

## 2022-08-18 MED ORDER — AMLODIPINE BESYLATE 10 MG PO TABS: ORAL_TABLET | ORAL | 3 refills | Status: DC

## 2022-08-26 ENCOUNTER — Ambulatory Visit (INDEPENDENT_AMBULATORY_CARE_PROVIDER_SITE_OTHER): Payer: PPO | Admitting: Family

## 2022-08-26 ENCOUNTER — Encounter: Payer: Self-pay | Admitting: Family

## 2022-08-26 VITALS — BP 136/78 | HR 78 | Temp 98.2°F | Ht 72.0 in | Wt 206.4 lb

## 2022-08-26 DIAGNOSIS — J40 Bronchitis, not specified as acute or chronic: Secondary | ICD-10-CM | POA: Diagnosis not present

## 2022-08-26 MED ORDER — AMOXICILLIN-POT CLAVULANATE 875-125 MG PO TABS: 1.0000 | ORAL_TABLET | Freq: Two times a day (BID) | ORAL | 0 refills | Status: AC

## 2022-08-26 NOTE — Progress Notes (Signed)
Subjective:    Patient ID: Larry Roman., male    DOB: 11-06-1946, 76 y.o.   MRN: [id number]  CC: Philopater Mucha. is a 76 y.o. male who presents today for an acute visit.    HPI: Cough x 2 weeks, some improvement.   Yellow- green mucous No fever, cp, facial pain, sore throat or ear pain.   HA have improved.      During 'strong coughing spell'  , a 'little shortness of breath' and he will use albuterol with relief.   Mucinex and otc 'sinus' medication with no relief.   History of OSA, asthma Never smoker  Past Medical History:  Diagnosis Date   Arthritis    Depression    DM (diabetes mellitus) ([place])    Extrinsic asthma, unspecified    childhood   GERD (gastroesophageal reflux disease)    rare   History of kidney stones    Hives of unknown origin    HTN (hypertension)    Other allergy, other than to medicinal agents    RBBB    Sleep apnea    25 years ago mild lost weight no longer uses cpap   Wound of right leg 08/2018   area just below knee size of quarter, reddened around edges   Past Surgical History:  Procedure Laterality Date   BACK SURGERY  1995   ruptured disc encapsulated nerves   CATARACT EXTRACTION, BILATERAL  2016   Dr. Kerman Passey at [facility]   CYSTOSCOPY/URETEROSCOPY/HOLMIUM LASER/STENT PLACEMENT Left 08/24/2021   Procedure: CYSTOSCOPY/URETEROSCOPY/HOLMIUM LASER/STENT PLACEMENT;  Surgeon: Abbie Sons, MD;  Location: [facility];  Service: Urology;  Laterality: Left;   EYE SURGERY Bilateral    cataract extractions   JOINT REPLACEMENT Right    total knee   TONSILLECTOMY     TONSILLECTOMY     TOTAL KNEE ARTHROPLASTY Right 09/27/2018   Procedure: TOTAL KNEE ARTHROPLASTY;  Surgeon: Thornton Park, MD;  Location: [facility];  Service: Orthopedics;  Laterality: Right;   TOTAL KNEE ARTHROPLASTY Left 08/15/2019   Procedure: TOTAL KNEE ARTHROPLASTY;  Surgeon: Thornton Park, MD;  Location: [facility];  Service: Orthopedics;  Laterality: Left;    Family History  Problem Relation Age of Onset   Heart failure Father    Heart attack Mother     Allergies: Ibuprofen Current Outpatient Medications on File Prior to Visit  Medication Sig Dispense Refill   pravastatin (PRAVACHOL) 40 MG tablet TAKE 1 TABLET BY MOUTH EVERY DAY (Patient taking differently: Take by mouth daily. Patient is taking m-w-f only) 90 tablet 1   ACCU-CHEK SOFTCLIX LANCETS lancets Use up to 4 times daily to check blood sugar. Diagnosis E11.9 100 each 12   acetaminophen (TYLENOL) 500 MG tablet Take 1,500 mg by mouth 2 (two) times daily.     albuterol (VENTOLIN HFA) 108 (90 Base) MCG/ACT inhaler Inhale 2 puffs into the lungs every 6 (six) hours as needed for wheezing or shortness of breath. 8 g 0   amLODipine (NORVASC) 10 MG tablet TAKE 1 TABLET BY MOUTH DAILY WITH LUNCH. 90 tablet 3   aspirin 81 MG chewable tablet Chew 81 mg by mouth daily. Every Tuesday and Friday     azelastine (ASTELIN) 0.1 % nasal spray Place 1 spray into both nostrils 2 (two) times daily. Use in each nostril as directed 30 mL 4   Cyanocobalamin (B-12) 5000 MCG SUBL Place 5,000 mcg under the tongue daily.      docusate sodium (COLACE) 100  MG capsule Take 100 mg by mouth daily.     DULoxetine (CYMBALTA) 30 MG capsule TAKE 1 CAPSULE BY MOUTH 2 TIMES DAILY. 180 capsule 2   fluticasone (FLONASE) 50 MCG/ACT nasal spray SPRAY 2 SPRAYS INTO EACH NOSTRIL EVERY DAY 48 mL 1   glucose blood (ONETOUCH VERIO) test strip TEST 3 TIMES A DAY 100 each 0   hydrochlorothiazide (MICROZIDE) 12.5 MG capsule Take 1 capsule (12.5 mg total) by mouth every Monday, Wednesday, and Friday. 90 capsule 1   loratadine (CLARITIN) 10 MG tablet Take 10 mg by mouth every morning.     losartan (COZAAR) 100 MG tablet Take 1 tablet (100 mg total) by mouth daily. 90 tablet 3   metFORMIN (GLUCOPHAGE) 500 MG tablet TAKE TABLET BY MOUTH EVERY MORNING WITH A MEAL & TWO TABLETS BY MOUTH EVERY EVENING WITH A MEAL.. 270 tablet 1   metoprolol  succinate (TOPROL-XL) 25 MG 24 hr tablet Take 1 tablet (25 mg total) by mouth daily. 90 tablet 3   tamsulosin (FLOMAX) 0.4 MG CAPS capsule TAKE 2 CAPSULES BY MOUTH EVERY DAY 180 capsule 2   traZODone (DESYREL) 50 MG tablet Take 1 tablet (50 mg total) by mouth at bedtime as needed. for sleep 90 tablet 1   No current facility-administered medications on file prior to visit.    Social History   Tobacco Use   Smoking status: Never   Smokeless tobacco: Never   Tobacco comments:    tobacco use - no  Vaping Use   Vaping Use: Never used  Substance Use Topics   Alcohol use: Yes    Comment: 6   Drug use: No    Review of Systems  Constitutional:  Negative for chills and fever.  HENT:  Positive for congestion. Negative for sore throat.   Respiratory:  Positive for cough. Negative for shortness of breath.   Cardiovascular:  Negative for chest pain and palpitations.  Gastrointestinal:  Negative for nausea and vomiting.      Objective:    BP 136/78 (BP Location: Left Arm, Patient Position: Sitting, Cuff Size: Normal)   Pulse 78   Temp 98.2 F (36.8 C) (Oral)   Ht 6' (1.829 m)   Wt 206 lb 6.4 oz (93.6 kg)   SpO2 96%   BMI 27.99 kg/m    Physical Exam Vitals reviewed.  Constitutional:      Appearance: He is well-developed.  HENT:     Head: Normocephalic and atraumatic.     Right Ear: Hearing, tympanic membrane, ear canal and external ear normal. No decreased hearing noted. No drainage, swelling or tenderness. No middle ear effusion. Tympanic membrane is not injected, erythematous or bulging.     Left Ear: Hearing, tympanic membrane, ear canal and external ear normal. No decreased hearing noted. No drainage, swelling or tenderness.  No middle ear effusion. Tympanic membrane is not injected, erythematous or bulging.     Nose: Nose normal.     Right Sinus: No maxillary sinus tenderness or frontal sinus tenderness.     Left Sinus: No maxillary sinus tenderness or frontal sinus  tenderness.     Mouth/Throat:     Pharynx: Uvula midline. No oropharyngeal exudate or posterior oropharyngeal erythema.     Tonsils: No tonsillar abscesses.  Eyes:     Conjunctiva/sclera: Conjunctivae normal.  Cardiovascular:     Rate and Rhythm: Regular rhythm.     Heart sounds: Normal heart sounds.  Pulmonary:     Effort: Pulmonary effort is normal. No  respiratory distress.     Breath sounds: Normal breath sounds. No wheezing, rhonchi or rales.  Lymphadenopathy:     Head:     Right side of head: No submental, submandibular, tonsillar, preauricular, posterior auricular or occipital adenopathy.     Left side of head: No submental, submandibular, tonsillar, preauricular, posterior auricular or occipital adenopathy.     Cervical: No cervical adenopathy.  Skin:    General: Skin is warm and dry.  Neurological:     Mental Status: He is alert.  Psychiatric:        Speech: Speech normal.        Behavior: Behavior normal.        Assessment & Plan:   Problem List Items Addressed This Visit       Respiratory   Bronchitis - Primary    No acute respiratory distress.  Patient is afebrile.  Based on duration of symptoms, advised patient that likely bacterial URI at this time.  We opted to start Augmentin.  Counseled him on probiotics.  He will let me know how he is doing      Relevant Medications   amoxicillin-clavulanate (AUGMENTIN) 875-125 MG tablet      I am having Larry Roman. start on amoxicillin-clavulanate. I am also having him maintain his loratadine, B-12, Accu-Chek Softclix Lancets, glucose blood, docusate sodium, acetaminophen, aspirin, DULoxetine, azelastine, fluticasone, albuterol, hydrochlorothiazide, metFORMIN, traZODone, pravastatin, metoprolol succinate, tamsulosin, losartan, and amLODipine.   Meds ordered this encounter  Medications   amoxicillin-clavulanate (AUGMENTIN) 875-125 MG tablet    Sig: Take 1 tablet by mouth 2 (two) times daily for 7 days.     Dispense:  14 tablet    Refill:  0    Order Specific Question:   Supervising Provider    Answer:   Crecencio Mc [[id number]]    Return precautions given.   Risks, benefits, and alternatives of the medications and treatment plan prescribed today were discussed, and patient expressed understanding.   Education regarding symptom management and diagnosis given to patient on AVS.  Continue to follow with Burnard Hawthorne, FNP for routine health maintenance.   [place] agreed with plan.   Mable Paris, FNP

## 2022-08-26 NOTE — Assessment & Plan Note (Signed)
No acute respiratory distress.  Patient is afebrile.  Based on duration of symptoms, advised patient that likely bacterial URI at this time.  We opted to start Augmentin.  Counseled him on probiotics.  He will let me know how he is doing

## 2022-08-30 DIAGNOSIS — M5136 Other intervertebral disc degeneration, lumbar region: Secondary | ICD-10-CM | POA: Diagnosis not present

## 2022-08-30 DIAGNOSIS — M62830 Muscle spasm of back: Secondary | ICD-10-CM | POA: Diagnosis not present

## 2022-08-30 DIAGNOSIS — M5416 Radiculopathy, lumbar region: Secondary | ICD-10-CM | POA: Diagnosis not present

## 2022-08-30 DIAGNOSIS — M9903 Segmental and somatic dysfunction of lumbar region: Secondary | ICD-10-CM | POA: Diagnosis not present

## 2022-09-12 ENCOUNTER — Ambulatory Visit: Payer: Self-pay | Admitting: Family

## 2022-09-22 DIAGNOSIS — I10 Essential (primary) hypertension: Secondary | ICD-10-CM | POA: Diagnosis not present

## 2022-09-22 DIAGNOSIS — R809 Proteinuria, unspecified: Secondary | ICD-10-CM | POA: Diagnosis not present

## 2022-09-22 DIAGNOSIS — N200 Calculus of kidney: Secondary | ICD-10-CM | POA: Diagnosis not present

## 2022-09-22 DIAGNOSIS — E119 Type 2 diabetes mellitus without complications: Secondary | ICD-10-CM | POA: Diagnosis not present

## 2022-09-22 DIAGNOSIS — N281 Cyst of kidney, acquired: Secondary | ICD-10-CM | POA: Diagnosis not present

## 2022-09-22 DIAGNOSIS — E785 Hyperlipidemia, unspecified: Secondary | ICD-10-CM | POA: Diagnosis not present

## 2022-09-22 DIAGNOSIS — N400 Enlarged prostate without lower urinary tract symptoms: Secondary | ICD-10-CM | POA: Diagnosis not present

## 2022-09-22 DIAGNOSIS — Z8739 Personal history of other diseases of the musculoskeletal system and connective tissue: Secondary | ICD-10-CM | POA: Diagnosis not present

## 2022-09-22 DIAGNOSIS — K219 Gastro-esophageal reflux disease without esophagitis: Secondary | ICD-10-CM | POA: Diagnosis not present

## 2022-09-27 DIAGNOSIS — M9903 Segmental and somatic dysfunction of lumbar region: Secondary | ICD-10-CM | POA: Diagnosis not present

## 2022-09-27 DIAGNOSIS — M62830 Muscle spasm of back: Secondary | ICD-10-CM | POA: Diagnosis not present

## 2022-09-27 DIAGNOSIS — M5136 Other intervertebral disc degeneration, lumbar region: Secondary | ICD-10-CM | POA: Diagnosis not present

## 2022-09-27 DIAGNOSIS — M5416 Radiculopathy, lumbar region: Secondary | ICD-10-CM | POA: Diagnosis not present

## 2022-09-30 ENCOUNTER — Other Ambulatory Visit: Payer: Self-pay | Admitting: Family

## 2022-09-30 DIAGNOSIS — I10 Essential (primary) hypertension: Secondary | ICD-10-CM

## 2022-10-05 IMAGING — US US RENAL
1 series · 14 of 25 positions shown · non-contrast
Comparison: CT abdomen and pelvis 12/28/2009

CLINICAL DATA: Proteinuria unspecified type. History diabetes
mellitus, hypertension

EXAM:
RENAL / URINARY TRACT ULTRASOUND COMPLETE

[Series 1: us renal · 0.26mm/px · 14 of 56 slices shown]
[im 1/56]
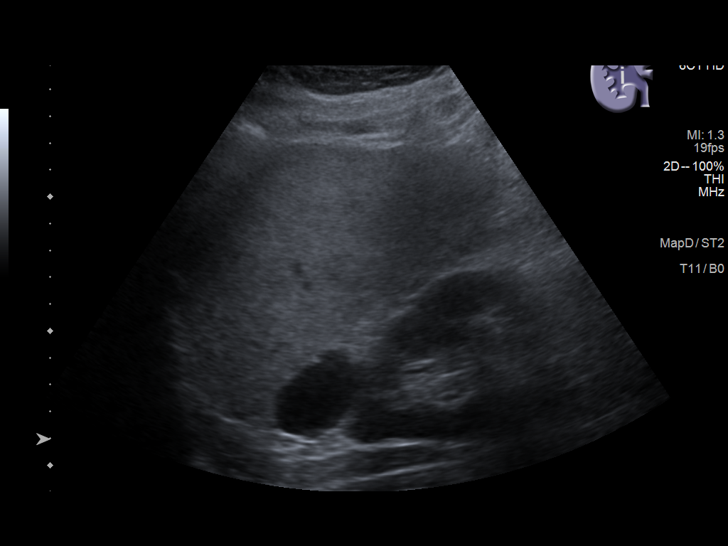
[im 5/56]
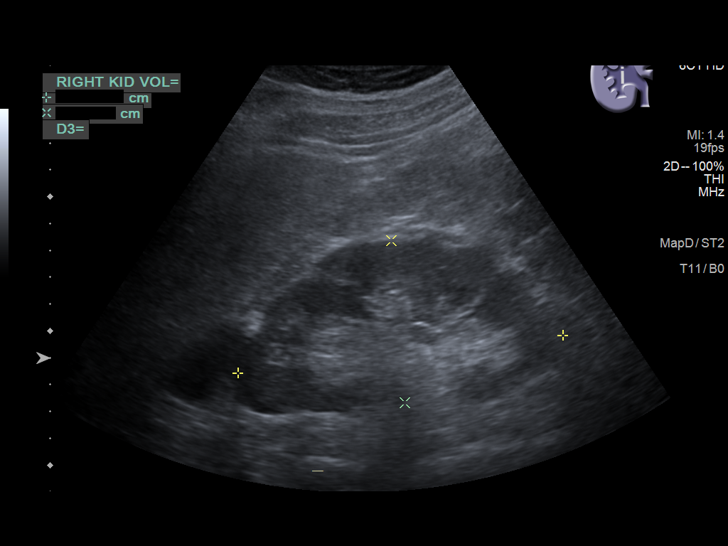
[im 10/56]
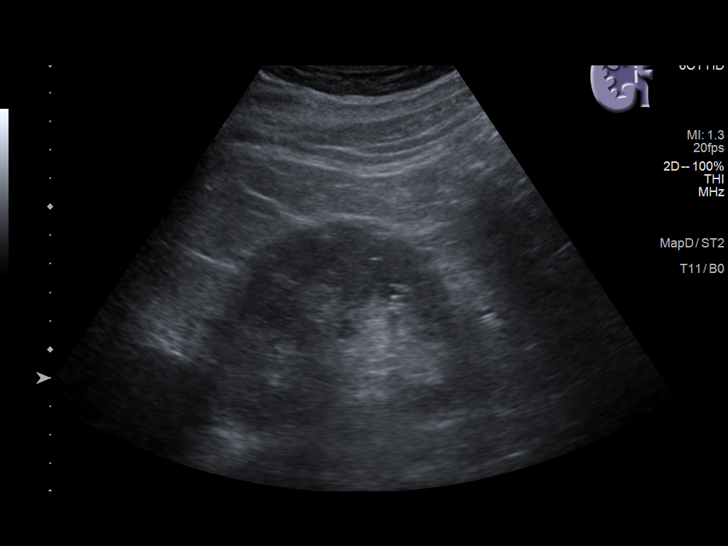
[im 14/56]
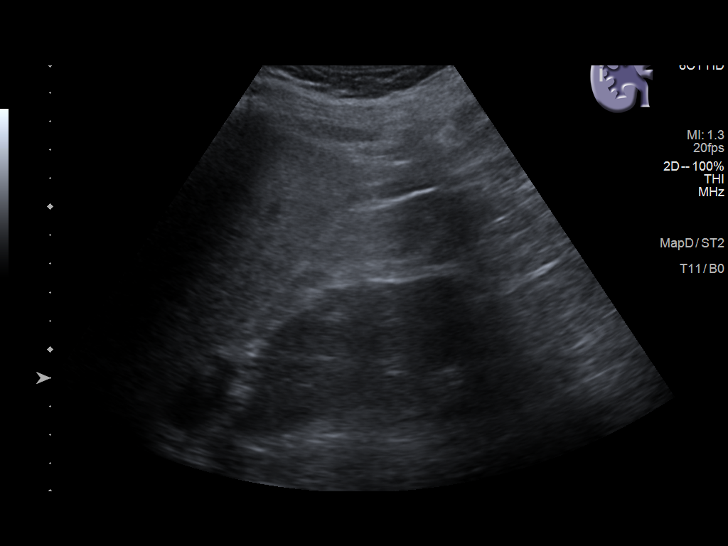
[im 19/56]
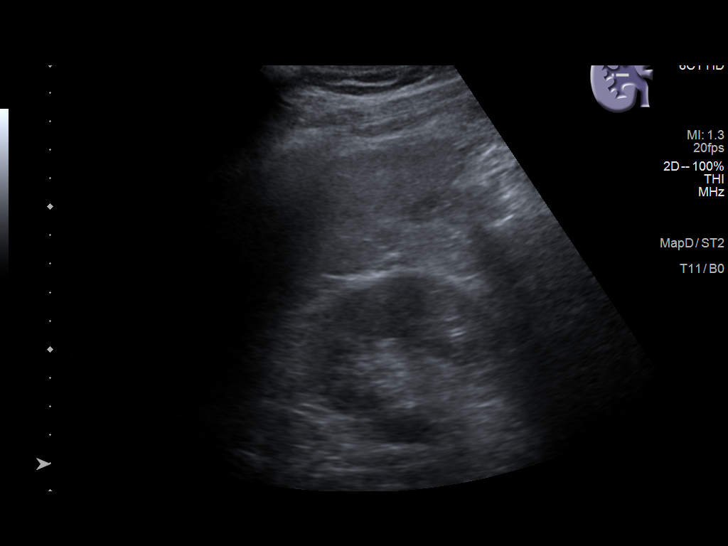
[im 21/56]
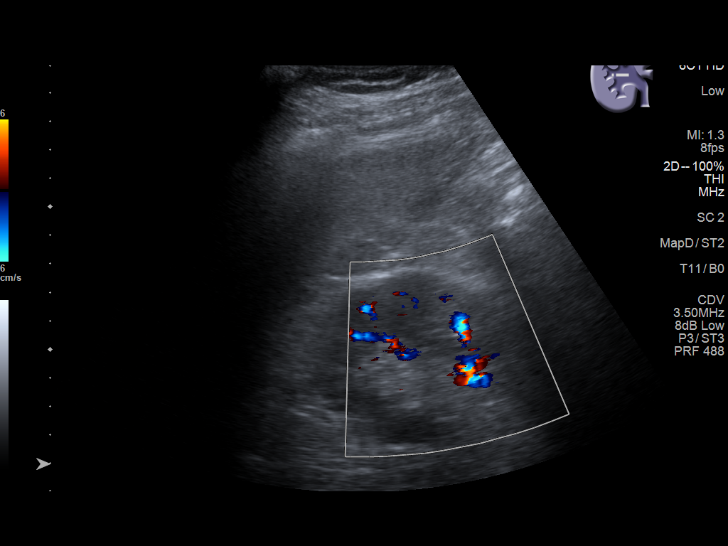
[im 26/56]
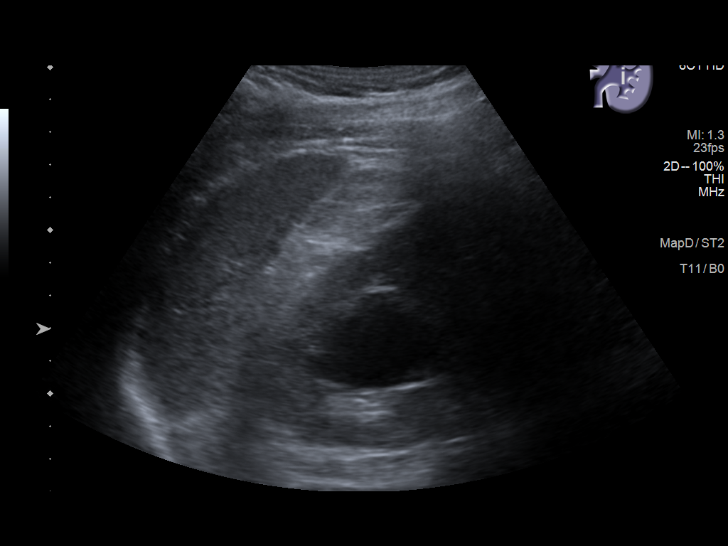
[im 30/56]
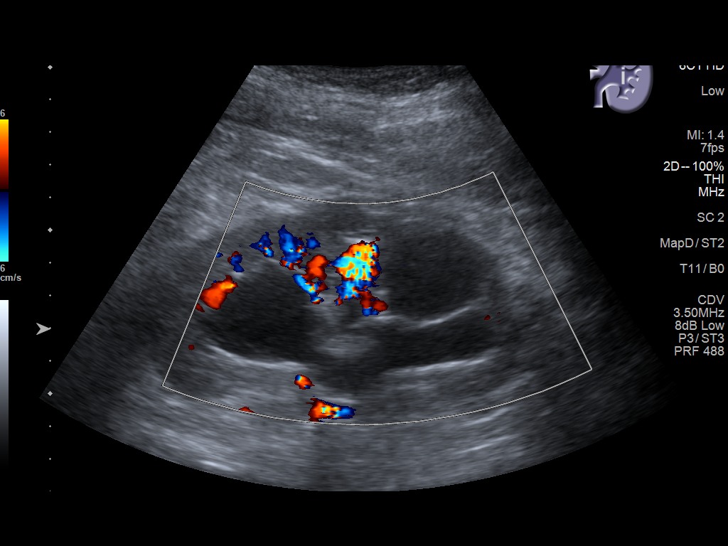
[im 35/56]
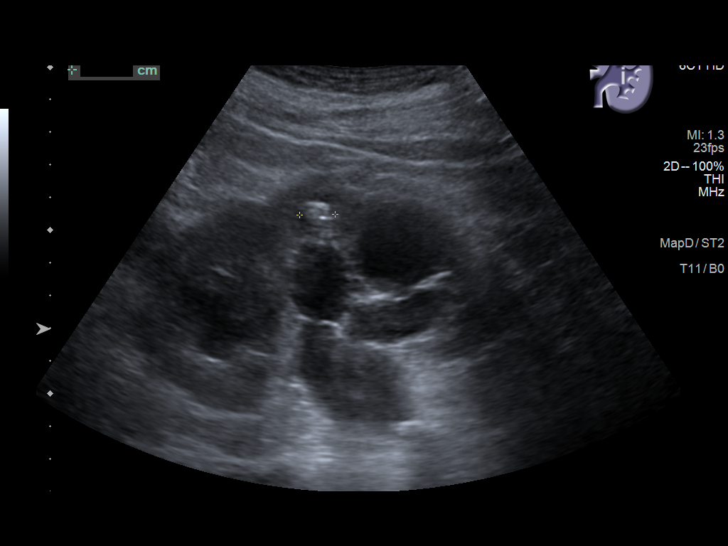
[im 37/56]
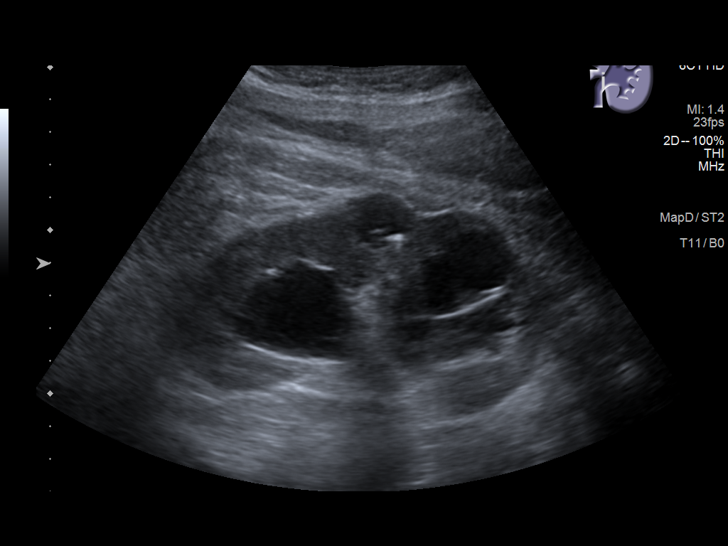
[im 42/56]
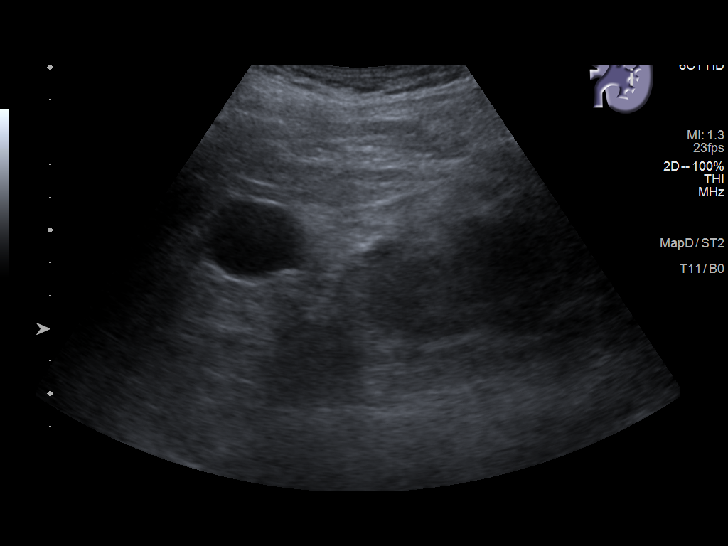
[im 46/56]
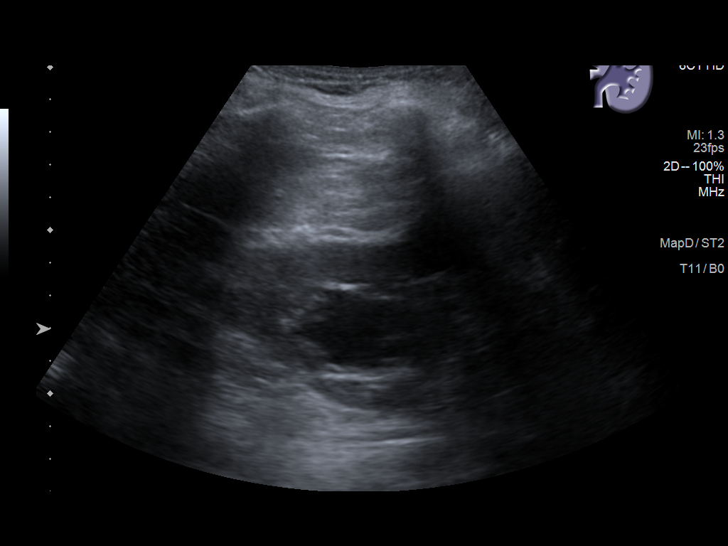
[im 51/56]
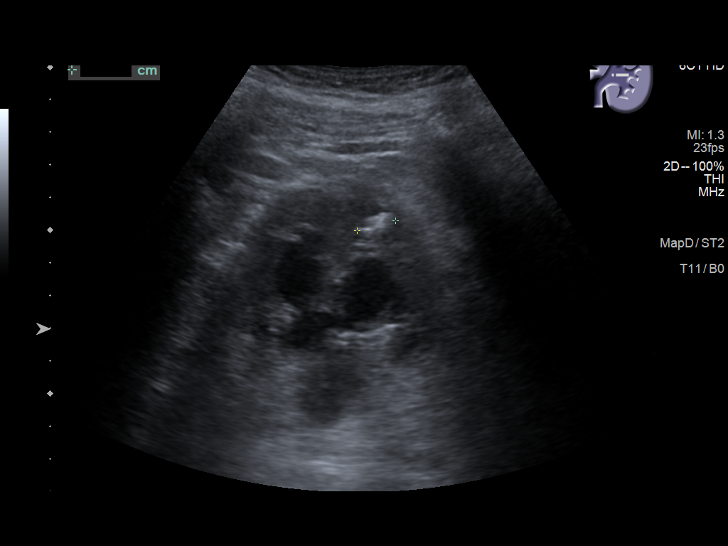
[im 56/56]
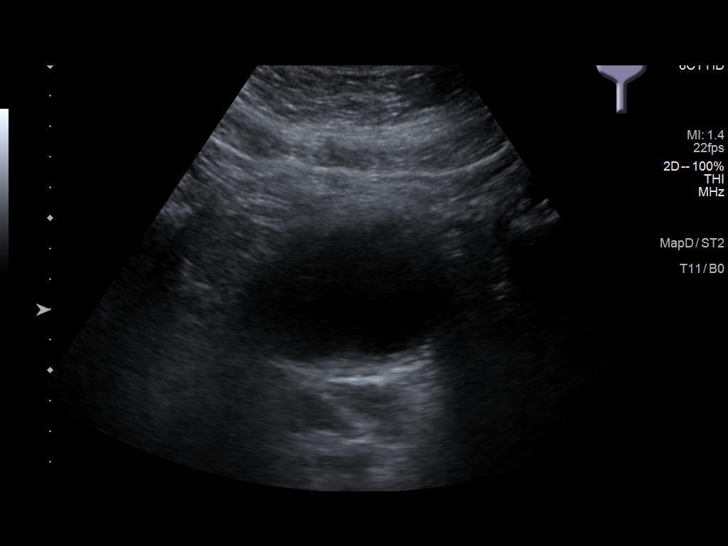

[14 of 25 positions shown; findings below may reference images not displayed]

FINDINGS: Right Kidney:

Renal measurements: 12.2 x 6.1 x 5.4 cm = volume: 208 mL. Normal
cortical thickness and echogenicity. Exophytic cyst at upper pole
3.5 x 2.0 x 3.1 cm, mildly increased in size since prior CT. No
additional mass, hydronephrosis, or shadowing calcification.

Left Kidney:

Renal measurements: 13.8 x 6.1 x 6.1 cm = volume: 268 mL. Mild
cortical thinning. Slightly increased cortical echogenicity.
Parenchymal calcification at mid kidney 12 mm diameter. Moderate
LEFT hydronephrosis. Small exophytic cyst at upper pole 2.8 cm
diameter. Additional inferior peripelvic renal cyst 3.2 cm diameter.
No solid mass.

Bladder:

Appears normal for partial degree of bladder distention.

Other:

N/A
IMPRESSION: BILATERAL renal cysts.

Moderate LEFT hydronephrosis of uncertain etiology; consider further
evaluation by CT.

## 2022-10-18 DIAGNOSIS — M9903 Segmental and somatic dysfunction of lumbar region: Secondary | ICD-10-CM | POA: Diagnosis not present

## 2022-10-18 DIAGNOSIS — M5416 Radiculopathy, lumbar region: Secondary | ICD-10-CM | POA: Diagnosis not present

## 2022-10-18 DIAGNOSIS — M5136 Other intervertebral disc degeneration, lumbar region: Secondary | ICD-10-CM | POA: Diagnosis not present

## 2022-10-18 DIAGNOSIS — M62830 Muscle spasm of back: Secondary | ICD-10-CM | POA: Diagnosis not present

## 2022-10-19 ENCOUNTER — Other Ambulatory Visit: Payer: Self-pay

## 2022-10-19 ENCOUNTER — Encounter: Payer: Self-pay | Admitting: Family

## 2022-10-19 ENCOUNTER — Ambulatory Visit (INDEPENDENT_AMBULATORY_CARE_PROVIDER_SITE_OTHER): Payer: PPO | Admitting: Family

## 2022-10-19 VITALS — BP 130/76 | HR 67 | Temp 97.5°F | Ht 72.0 in | Wt 205.6 lb

## 2022-10-19 DIAGNOSIS — Z23 Encounter for immunization: Secondary | ICD-10-CM | POA: Diagnosis not present

## 2022-10-19 DIAGNOSIS — I10 Essential (primary) hypertension: Secondary | ICD-10-CM

## 2022-10-19 DIAGNOSIS — E119 Type 2 diabetes mellitus without complications: Secondary | ICD-10-CM

## 2022-10-19 LAB — POCT GLYCOSYLATED HEMOGLOBIN (HGB A1C): Hemoglobin A1C: 5.7 % — AB (ref 4.0–5.6)

## 2022-10-19 MED ORDER — METFORMIN HCL 500 MG PO TABS: ORAL_TABLET | ORAL | 1 refills | Status: DC

## 2022-10-19 MED ORDER — AMLODIPINE BESYLATE 10 MG PO TABS: ORAL_TABLET | ORAL | 3 refills | Status: DC

## 2022-10-19 NOTE — Assessment & Plan Note (Signed)
Lab Results  Component Value Date   HGBA1C 5.8 (A) 06/10/2022   Excellent control. Continue '500mg'$  metformin every morning, 1000 mg every evening

## 2022-10-19 NOTE — Progress Notes (Signed)
Subjective:    Patient ID: Larry Ramus., male    DOB: Nov 16, 1946, 76 y.o.   MRN: [id number]  CC: Larry Fosdick. is a 76 y.o. male who presents today for follow up.   HPI: Feels well today.  No new complaints.  Has been working on eating healthier and has lost weight intentionally.   HTN-compliant with amlodipine 10 mg daily, metoprolol 25 mg daily, hydrochlorothiazide 12.5 mg daily, losartan 100 mg No cp.   Diabetes-compliant with '500mg'$  metformin every morning, 1000 mg every evening   consult nephrology 09/22/2022, Dr. Lanora Manis regarding proteinuria, hypertension, diabetes.  Advised to stop taking vitamin D due to hypercalcemia  HISTORY:  Past Medical History:  Diagnosis Date   Arthritis    Depression    DM (diabetes mellitus) ([place])    Extrinsic asthma, unspecified    childhood   GERD (gastroesophageal reflux disease)    rare   History of kidney stones    Hives of unknown origin    HTN (hypertension)    Other allergy, other than to medicinal agents    RBBB    Sleep apnea    25 years ago mild lost weight no longer uses cpap   Wound of right leg 08/2018   area just below knee size of quarter, reddened around edges   Past Surgical History:  Procedure Laterality Date   BACK SURGERY  1995   ruptured disc encapsulated nerves   CATARACT EXTRACTION, BILATERAL  2016   Dr. Kerman Passey at [facility]   CYSTOSCOPY/URETEROSCOPY/HOLMIUM LASER/STENT PLACEMENT Left 08/24/2021   Procedure: CYSTOSCOPY/URETEROSCOPY/HOLMIUM LASER/STENT PLACEMENT;  Surgeon: Abbie Sons, MD;  Location: [facility] ORS;  Service: Urology;  Laterality: Left;   EYE SURGERY Bilateral    cataract extractions   JOINT REPLACEMENT Right    total knee   TONSILLECTOMY     TONSILLECTOMY     TOTAL KNEE ARTHROPLASTY Right 09/27/2018   Procedure: TOTAL KNEE ARTHROPLASTY;  Surgeon: Thornton Park, MD;  Location: [facility] OR[facility];  Service: Orthopedics;  Laterality: Right;   TOTAL KNEE ARTHROPLASTY Left  08/15/2019   Procedure: TOTAL KNEE ARTHROPLASTY;  Surgeon: Thornton Park, MD;  Location: [facility] OR[facility];  Service: Orthopedics;  Laterality: Left;   Family History  Problem Relation Age of Onset   Heart failure Father    Heart attack Mother     Allergies: Ibuprofen Current Outpatient Medications on File Prior to Visit  Medication Sig Dispense Refill   ACCU-CHEK SOFTCLIX LANCETS lancets Use up to 4 times daily to check blood sugar. Diagnosis E11.9 100 each 12   acetaminophen (TYLENOL) 500 MG tablet Take 1,500 mg by mouth 2 (two) times daily.     albuterol (VENTOLIN HFA) 108 (90 Base) MCG/ACT inhaler Inhale 2 puffs into the lungs every 6 (six) hours as needed for wheezing or shortness of breath. 8 g 0   aspirin 81 MG chewable tablet Chew 81 mg by mouth daily. Every Tuesday and Friday     azelastine (ASTELIN) 0.1 % nasal spray Place 1 spray into both nostrils 2 (two) times daily. Use in each nostril as directed 30 mL 4   Cyanocobalamin (B-12) 5000 MCG SUBL Place 5,000 mcg under the tongue daily.      docusate sodium (COLACE) 100 MG capsule Take 100 mg by mouth daily.     DULoxetine (CYMBALTA) 30 MG capsule TAKE 1 CAPSULE BY MOUTH 2 TIMES DAILY. 180 capsule 2   fluticasone (FLONASE) 50 MCG/ACT nasal spray SPRAY 2 SPRAYS INTO [facility]  NOSTRIL EVERY DAY 48 mL 1   glucose blood (ONETOUCH VERIO) test strip TEST 3 TIMES A DAY 100 each 0   hydrochlorothiazide (MICROZIDE) 12.5 MG capsule TAKE 1 CAPSULE BY MOUTH EVERY DAY 90 capsule 1   loratadine (CLARITIN) 10 MG tablet Take 10 mg by mouth every morning.     losartan (COZAAR) 100 MG tablet Take 1 tablet (100 mg total) by mouth daily. 90 tablet 3   metoprolol succinate (TOPROL-XL) 25 MG 24 hr tablet Take 1 tablet (25 mg total) by mouth daily. 90 tablet 3   pravastatin (PRAVACHOL) 40 MG tablet TAKE 1 TABLET BY MOUTH EVERY DAY (Patient taking differently: Take by mouth daily. Patient is taking m-w-f only) 90 tablet 1   tamsulosin (FLOMAX) 0.4 MG CAPS capsule  TAKE 2 CAPSULES BY MOUTH EVERY DAY 180 capsule 2   traZODone (DESYREL) 50 MG tablet Take 1 tablet (50 mg total) by mouth at bedtime as needed. for sleep 90 tablet 1   No current facility-administered medications on file prior to visit.    Social History   Tobacco Use   Smoking status: Never   Smokeless tobacco: Never   Tobacco comments:    tobacco use - no  Vaping Use   Vaping Use: Never used  Substance Use Topics   Alcohol use: Yes    Comment: 6   Drug use: No    Review of Systems  Constitutional:  Negative for chills and fever.  Respiratory:  Negative for cough.   Cardiovascular:  Negative for chest pain and palpitations.  Gastrointestinal:  Negative for nausea and vomiting.      Objective:    BP 130/76   Pulse 67   Temp (!) 97.5 F (36.4 C) (Oral)   Ht 6' (1.829 m)   Wt 205 lb 9.6 oz (93.3 kg)   SpO2 96%   BMI 27.88 kg/m  BP Readings from Last 3 Encounters:  10/19/22 130/76  08/26/22 136/78  06/10/22 126/68   Wt Readings from Last 3 Encounters:  10/19/22 205 lb 9.6 oz (93.3 kg)  08/26/22 206 lb 6.4 oz (93.6 kg)  06/10/22 207 lb 6.4 oz (94.1 kg)    Physical Exam Vitals reviewed.  Constitutional:      Appearance: He is well-developed.  Cardiovascular:     Rate and Rhythm: Regular rhythm.     Heart sounds: Normal heart sounds.  Pulmonary:     Effort: Pulmonary effort is normal. No respiratory distress.     Breath sounds: Normal breath sounds. No wheezing, rhonchi or rales.  Skin:    General: Skin is warm and dry.  Neurological:     Mental Status: He is alert.  Psychiatric:        Speech: Speech normal.        Behavior: Behavior normal.        Assessment & Plan:   Problem List Items Addressed This Visit       Cardiovascular and Mediastinum   Hypertension    Chronic, stable. Continue amlodipine 10 mg daily, metoprolol 25 mg daily, hydrochlorothiazide 12.5 mg daily, losartan 100 mg      Relevant Orders   Comprehensive metabolic panel      Endocrine   Diabetes mellitus type 2, controlled ([place]) - Primary    Lab Results  Component Value Date   HGBA1C 5.8 (A) 06/10/2022  Excellent control. Continue '500mg'$  metformin every morning, 1000 mg every evening       Relevant Orders   POCT HgB A1C (  Completed)   Lipid panel   Comprehensive metabolic panel     I am having Larry Ramus. maintain his loratadine, B-12, Accu-Chek Softclix Lancets, glucose blood, docusate sodium, acetaminophen, aspirin, DULoxetine, azelastine, fluticasone, albuterol, traZODone, pravastatin, metoprolol succinate, tamsulosin, losartan, and hydrochlorothiazide.   No orders of the defined types were placed in this encounter.   Return precautions given.   Risks, benefits, and alternatives of the medications and treatment plan prescribed today were discussed, and patient expressed understanding.   Education regarding symptom management and diagnosis given to patient on AVS.  Continue to follow with Burnard Hawthorne, FNP for routine health maintenance.   [place] agreed with plan.   Mable Paris, FNP

## 2022-10-19 NOTE — Assessment & Plan Note (Signed)
Chronic, stable. Continue amlodipine 10 mg daily, metoprolol 25 mg daily, hydrochlorothiazide 12.5 mg daily, losartan 100 mg

## 2022-11-02 ENCOUNTER — Other Ambulatory Visit (INDEPENDENT_AMBULATORY_CARE_PROVIDER_SITE_OTHER): Payer: PPO

## 2022-11-02 DIAGNOSIS — E119 Type 2 diabetes mellitus without complications: Secondary | ICD-10-CM | POA: Diagnosis not present

## 2022-11-02 DIAGNOSIS — I10 Essential (primary) hypertension: Secondary | ICD-10-CM

## 2022-11-02 LAB — LIPID PANEL
Cholesterol: 102 mg/dL (ref 0–200)
HDL: 46.7 mg/dL (ref >39.00)
LDL Cholesterol: 34 mg/dL (ref 0–99)
NonHDL: 55.52
Total CHOL/HDL Ratio: 2
Triglycerides: 107 mg/dL (ref 0.0–149.0)
VLDL: 21.4 mg/dL (ref 0.0–40.0)

## 2022-11-02 LAB — COMPREHENSIVE METABOLIC PANEL
ALT: 19 U/L (ref 0–53)
AST: 16 U/L (ref 0–37)
Albumin: 4.6 g/dL (ref 3.5–5.2)
Alkaline Phosphatase: 65 U/L (ref 39–117)
BUN: 24 mg/dL — ABNORMAL HIGH (ref 6–23)
CO2: 29 mEq/L (ref 19–32)
Calcium: 10.1 mg/dL (ref 8.4–10.5)
Chloride: 101 mEq/L (ref 96–112)
Creatinine, Ser: 0.87 mg/dL (ref 0.40–1.50)
GFR: 84.52 mL/min (ref >60.00)
Glucose, Bld: 116 mg/dL — ABNORMAL HIGH (ref 70–99)
Potassium: 4.2 mEq/L (ref 3.5–5.1)
Sodium: 138 mEq/L (ref 135–145)
Total Bilirubin: 0.9 mg/dL (ref 0.2–1.2)
Total Protein: 7 g/dL (ref 6.0–8.3)

## 2022-11-08 DIAGNOSIS — M5416 Radiculopathy, lumbar region: Secondary | ICD-10-CM | POA: Diagnosis not present

## 2022-11-08 DIAGNOSIS — M9903 Segmental and somatic dysfunction of lumbar region: Secondary | ICD-10-CM | POA: Diagnosis not present

## 2022-11-08 DIAGNOSIS — M62830 Muscle spasm of back: Secondary | ICD-10-CM | POA: Diagnosis not present

## 2022-11-08 DIAGNOSIS — M5136 Other intervertebral disc degeneration, lumbar region: Secondary | ICD-10-CM | POA: Diagnosis not present

## 2022-11-17 ENCOUNTER — Other Ambulatory Visit: Payer: Self-pay | Admitting: Family

## 2022-11-17 ENCOUNTER — Other Ambulatory Visit: Payer: Self-pay

## 2022-11-17 ENCOUNTER — Encounter: Payer: Self-pay | Admitting: Family

## 2022-11-17 DIAGNOSIS — F339 Major depressive disorder, recurrent, unspecified: Secondary | ICD-10-CM

## 2022-11-17 MED ORDER — DULOXETINE HCL 30 MG PO CPEP: 30.0000 mg | ORAL_CAPSULE | Freq: Two times a day (BID) | ORAL | 2 refills | Status: DC

## 2022-11-17 MED ORDER — TRAZODONE HCL 50 MG PO TABS: 50.0000 mg | ORAL_TABLET | Freq: Every evening | ORAL | 1 refills | Status: DC | PRN

## 2022-11-23 ENCOUNTER — Ambulatory Visit: Payer: PPO | Admitting: Urology

## 2022-12-04 ENCOUNTER — Other Ambulatory Visit: Payer: Self-pay | Admitting: Family

## 2022-12-04 DIAGNOSIS — R0981 Nasal congestion: Secondary | ICD-10-CM

## 2022-12-06 DIAGNOSIS — M62830 Muscle spasm of back: Secondary | ICD-10-CM | POA: Diagnosis not present

## 2022-12-06 DIAGNOSIS — M9903 Segmental and somatic dysfunction of lumbar region: Secondary | ICD-10-CM | POA: Diagnosis not present

## 2022-12-06 DIAGNOSIS — M5416 Radiculopathy, lumbar region: Secondary | ICD-10-CM | POA: Diagnosis not present

## 2022-12-06 DIAGNOSIS — M5136 Other intervertebral disc degeneration, lumbar region: Secondary | ICD-10-CM | POA: Diagnosis not present

## 2022-12-12 ENCOUNTER — Other Ambulatory Visit: Payer: Self-pay | Admitting: *Deleted

## 2022-12-12 DIAGNOSIS — N200 Calculus of kidney: Secondary | ICD-10-CM

## 2022-12-15 ENCOUNTER — Ambulatory Visit
Admission: RE | Admit: 2022-12-15 | Discharge: 2022-12-15 | Disposition: A | Discharge status: Home / Self Care | Payer: PPO | Source: Ambulatory Visit | Attending: Urology | Admitting: Urology

## 2022-12-15 ENCOUNTER — Ambulatory Visit
Admission: RE | Admit: 2022-12-15 | Discharge: 2022-12-15 | Disposition: A | Discharge status: Home / Self Care | Payer: PPO | Attending: Urology | Admitting: Urology

## 2022-12-15 DIAGNOSIS — N200 Calculus of kidney: Secondary | ICD-10-CM | POA: Insufficient documentation

## 2022-12-15 DIAGNOSIS — K5900 Constipation, unspecified: Secondary | ICD-10-CM | POA: Diagnosis not present

## 2022-12-16 ENCOUNTER — Encounter: Payer: Self-pay | Admitting: Urology

## 2022-12-16 ENCOUNTER — Ambulatory Visit (INDEPENDENT_AMBULATORY_CARE_PROVIDER_SITE_OTHER): Payer: PPO | Admitting: Urology

## 2022-12-16 VITALS — BP 175/90 | HR 64 | Ht 72.0 in | Wt 203.0 lb

## 2022-12-16 DIAGNOSIS — N200 Calculus of kidney: Secondary | ICD-10-CM

## 2022-12-16 DIAGNOSIS — N401 Enlarged prostate with lower urinary tract symptoms: Secondary | ICD-10-CM

## 2022-12-16 DIAGNOSIS — Z87442 Personal history of urinary calculi: Secondary | ICD-10-CM

## 2022-12-16 DIAGNOSIS — N400 Enlarged prostate without lower urinary tract symptoms: Secondary | ICD-10-CM

## 2022-12-16 NOTE — Progress Notes (Signed)
12/16/2022 11:42 AM   Larry Roman. 06/12/1946 [id number]  Referring provider: Burnard Hawthorne, FNP [address] [place],  [place] [address]  Chief Complaint  Patient presents with   Nephrolithiasis    Urologic history:  1.  BPH with LUTS Tamsulosin daily  2.  Nephrolithiasis CT 07/19/2021 with 14 mm left proximal ureteral calculus/hydronephrosis and bilateral nonobstructing renal calculi Left ureteroscopy/stone removal 08/24/2021; stone impacted Declined metabolic evaluation  3.  Bilateral renal cysts  HPI: 77 y.o. male presents for follow-up visit.  Doing well since last visit No bothersome LUTS; notes occasional weak stream and postvoid dribbling Denies dysuria, gross hematuria Denies flank, abdominal or pelvic pain KUB today personally viewed.  Moderate amount of stool in bag is overlying the renal outlines.  No definite calcifications identified   PMH: Past Medical History:  Diagnosis Date   Arthritis    Depression    DM (diabetes mellitus) ([place])    Extrinsic asthma, unspecified    childhood   GERD (gastroesophageal reflux disease)    rare   History of kidney stones    Hives of unknown origin    HTN (hypertension)    Other allergy, other than to medicinal agents    RBBB    Sleep apnea    25 years ago mild lost weight no longer uses cpap   Wound of right leg 08/2018   area just below knee size of quarter, reddened around edges    Surgical History: Past Surgical History:  Procedure Laterality Date   BACK SURGERY  1995   ruptured disc encapsulated nerves   CATARACT EXTRACTION, BILATERAL  2016   Dr. Kerman Passey at [facility]   CYSTOSCOPY/URETEROSCOPY/HOLMIUM LASER/STENT PLACEMENT Left 08/24/2021   Procedure: CYSTOSCOPY/URETEROSCOPY/HOLMIUM LASER/STENT PLACEMENT;  Surgeon: Abbie Sons, MD;  Location: [facility] OR[facility];  Service: Urology;  Laterality: Left;   EYE SURGERY Bilateral    cataract extractions   JOINT REPLACEMENT Right     total knee   TONSILLECTOMY     TONSILLECTOMY     TOTAL KNEE ARTHROPLASTY Right 09/27/2018   Procedure: TOTAL KNEE ARTHROPLASTY;  Surgeon: Thornton Park, MD;  Location: [facility];  Service: Orthopedics;  Laterality: Right;   TOTAL KNEE ARTHROPLASTY Left 08/15/2019   Procedure: TOTAL KNEE ARTHROPLASTY;  Surgeon: Thornton Park, MD;  Location: [facility];  Service: Orthopedics;  Laterality: Left;    Home Medications:  Allergies as of 12/16/2022       Reactions   Ibuprofen Other (See Comments)   Ibuprofen and motrin causes bp to elevate        Medication List        Accurate as of December 16, 2022 11:42 AM. If you have any questions, ask your nurse or doctor.          Accu-Chek Softclix Lancets lancets Use up to 4 times daily to check blood sugar. Diagnosis E11.9   acetaminophen 500 MG tablet Commonly known as: TYLENOL Take 1,500 mg by mouth 2 (two) times daily.   albuterol 108 (90 Base) MCG/ACT inhaler Commonly known as: VENTOLIN HFA Inhale 2 puffs into the lungs every 6 (six) hours as needed for wheezing or shortness of breath.   amLODipine 10 MG tablet Commonly known as: NORVASC TAKE 1 TABLET BY MOUTH DAILY WITH LUNCH.   aspirin 81 MG chewable tablet Chew 81 mg by mouth daily. Every Tuesday and Friday   azelastine 0.1 % nasal spray Commonly known as: ASTELIN Place 1 spray into both nostrils  2 (two) times daily. Use in each nostril as directed   B-12 5000 MCG Subl Place 5,000 mcg under the tongue daily.   docusate sodium 100 MG capsule Commonly known as: COLACE Take 100 mg by mouth daily.   DULoxetine 30 MG capsule Commonly known as: CYMBALTA Take 1 capsule (30 mg total) by mouth 2 (two) times daily.   fluticasone 50 MCG/ACT nasal spray Commonly known as: FLONASE SPRAY 2 SPRAYS INTO EACH NOSTRIL EVERY DAY   glucose blood test strip Commonly known as: OneTouch Verio TEST 3 TIMES A DAY   hydrochlorothiazide 12.5 MG capsule Commonly known as:  MICROZIDE TAKE 1 CAPSULE BY MOUTH EVERY DAY   loratadine 10 MG tablet Commonly known as: CLARITIN Take 10 mg by mouth every morning.   losartan 100 MG tablet Commonly known as: COZAAR Take 1 tablet (100 mg total) by mouth daily.   metFORMIN 500 MG tablet Commonly known as: GLUCOPHAGE TAKE TABLET BY MOUTH EVERY MORNING WITH A MEAL & TWO TABLETS BY MOUTH EVERY EVENING WITH A MEAL..   metoprolol succinate 25 MG 24 hr tablet Commonly known as: TOPROL-XL Take 1 tablet (25 mg total) by mouth daily.   pravastatin 40 MG tablet Commonly known as: PRAVACHOL TAKE 1 TABLET BY MOUTH EVERY DAY What changed:  how much to take additional instructions   tamsulosin 0.4 MG Caps capsule Commonly known as: FLOMAX TAKE 2 CAPSULES BY MOUTH EVERY DAY   traZODone 50 MG tablet Commonly known as: DESYREL Take 1 tablet (50 mg total) by mouth at bedtime as needed. for sleep        Allergies:  Allergies  Allergen Reactions   Ibuprofen Other (See Comments)    Ibuprofen and motrin causes bp to elevate    Family History: Family History  Problem Relation Age of Onset   Heart failure Father    Heart attack Mother     Social History:  reports that he has never smoked. He has never used smokeless tobacco. He reports current alcohol use. He reports that he does not use drugs.   Physical Exam: BP (!) 175/90   Pulse 64   Ht 6' (1.829 m)   Wt 203 lb (92.1 kg)   BMI 27.53 kg/m   Constitutional:  Alert and oriented, No acute distress. HEENT: [place] AT, moist mucus membranes.  Trachea midline, no masses. Cardiovascular: No clubbing, cyanosis, or edema. Respiratory: Normal respiratory effort, no increased work of breathing. Psychiatric: Normal mood and affect.   Pertinent Imaging: KUB images personally reviewed and interpreted   Assessment & Plan:    1.  BPH with LUTS Stable on tamsulosin He did not need refills Continue annual follow-up  2.  Nephrolithiasis Nonobstructing calculi  seen on CT 2022 Not seen on KUB today Follow-up KUB 2 years, return as needed for renal colic   Abbie Sons, MD  [place] [address], [place], [place] [address] [phone]

## 2023-01-03 DIAGNOSIS — M9903 Segmental and somatic dysfunction of lumbar region: Secondary | ICD-10-CM | POA: Diagnosis not present

## 2023-01-03 DIAGNOSIS — M5136 Other intervertebral disc degeneration, lumbar region: Secondary | ICD-10-CM | POA: Diagnosis not present

## 2023-01-03 DIAGNOSIS — M62830 Muscle spasm of back: Secondary | ICD-10-CM | POA: Diagnosis not present

## 2023-01-03 DIAGNOSIS — M5416 Radiculopathy, lumbar region: Secondary | ICD-10-CM | POA: Diagnosis not present

## 2023-01-12 DIAGNOSIS — H26492 Other secondary cataract, left eye: Secondary | ICD-10-CM | POA: Diagnosis not present

## 2023-01-12 DIAGNOSIS — Z961 Presence of intraocular lens: Secondary | ICD-10-CM | POA: Diagnosis not present

## 2023-01-12 DIAGNOSIS — E119 Type 2 diabetes mellitus without complications: Secondary | ICD-10-CM | POA: Diagnosis not present

## 2023-01-12 DIAGNOSIS — H43813 Vitreous degeneration, bilateral: Secondary | ICD-10-CM | POA: Diagnosis not present

## 2023-01-12 LAB — HM DIABETES EYE EXAM

## 2023-01-31 DIAGNOSIS — M5416 Radiculopathy, lumbar region: Secondary | ICD-10-CM | POA: Diagnosis not present

## 2023-01-31 DIAGNOSIS — M5136 Other intervertebral disc degeneration, lumbar region: Secondary | ICD-10-CM | POA: Diagnosis not present

## 2023-01-31 DIAGNOSIS — M62830 Muscle spasm of back: Secondary | ICD-10-CM | POA: Diagnosis not present

## 2023-01-31 DIAGNOSIS — M9903 Segmental and somatic dysfunction of lumbar region: Secondary | ICD-10-CM | POA: Diagnosis not present

## 2023-02-17 ENCOUNTER — Ambulatory Visit (INDEPENDENT_AMBULATORY_CARE_PROVIDER_SITE_OTHER): Payer: PPO | Admitting: Family

## 2023-02-17 ENCOUNTER — Encounter: Payer: Self-pay | Admitting: Family

## 2023-02-17 VITALS — BP 122/80 | HR 86 | Temp 97.9°F | Ht 72.0 in | Wt 203.0 lb

## 2023-02-17 DIAGNOSIS — E119 Type 2 diabetes mellitus without complications: Secondary | ICD-10-CM | POA: Diagnosis not present

## 2023-02-17 DIAGNOSIS — N522 Drug-induced erectile dysfunction: Secondary | ICD-10-CM | POA: Diagnosis not present

## 2023-02-17 DIAGNOSIS — R7309 Other abnormal glucose: Secondary | ICD-10-CM

## 2023-02-17 DIAGNOSIS — I10 Essential (primary) hypertension: Secondary | ICD-10-CM

## 2023-02-17 LAB — COMPREHENSIVE METABOLIC PANEL
ALT: 16 U/L (ref 0–53)
AST: 16 U/L (ref 0–37)
Albumin: 4.4 g/dL (ref 3.5–5.2)
Alkaline Phosphatase: 70 U/L (ref 39–117)
BUN: 25 mg/dL — ABNORMAL HIGH (ref 6–23)
CO2: 28 mEq/L (ref 19–32)
Calcium: 10.2 mg/dL (ref 8.4–10.5)
Chloride: 101 mEq/L (ref 96–112)
Creatinine, Ser: 0.82 mg/dL (ref 0.40–1.50)
GFR: 85.86 mL/min (ref >60.00)
Glucose, Bld: 106 mg/dL — ABNORMAL HIGH (ref 70–99)
Potassium: 4.2 mEq/L (ref 3.5–5.1)
Sodium: 138 mEq/L (ref 135–145)
Total Bilirubin: 1 mg/dL (ref 0.2–1.2)
Total Protein: 7.2 g/dL (ref 6.0–8.3)

## 2023-02-17 LAB — POCT GLYCOSYLATED HEMOGLOBIN (HGB A1C): Hemoglobin A1C: 5.9 % — AB (ref 4.0–5.6)

## 2023-02-17 MED ORDER — SILDENAFIL CITRATE 25 MG PO TABS: 25.0000 mg | ORAL_TABLET | Freq: Every day | ORAL | 3 refills | Status: DC | PRN

## 2023-02-17 NOTE — Patient Instructions (Signed)
Trial of viagra  Sildenafil Tablets (Erectile Dysfunction) What is this medication? SILDENAFIL (sil DEN a fil) treats erectile dysfunction (ED). It works by increasing blood flow to the penis, which helps to maintain an erection. This medicine may be used for other purposes; ask your health care provider or pharmacist if you have questions. COMMON BRAND NAME(S): Viagra What should I tell my care team before I take this medication? They need to know if you have any of these conditions: Bleeding disorders Eye or vision problems, including a rare inherited eye disease called retinitis pigmentosa Anatomical deformation of the penis, Peyronie's disease, or history of priapism (painful and prolonged erection) Heart disease, angina, a history of heart attack, irregular heartbeats, or other heart problems High or low blood pressure History of blood diseases, like sickle cell anemia or leukemia History of stomach bleeding Kidney disease Liver disease Stroke An unusual or allergic reaction to sildenafil, other medications, foods, dyes, or preservatives Pregnant or trying to get pregnant Breast-feeding How should I use this medication? Take this medication by mouth with a glass of water. Follow the directions on the prescription label. The dose is usually taken 1 hour before sexual activity. You should not take the dose more than once per day. Do not take your medication more often than directed. Talk to your care team about the use of this medication in children. This medication is not used in children for this condition. Overdosage: If you think you have taken too much of this medicine contact a poison control center or emergency room at once. NOTE: This medicine is only for you. Do not share this medicine with others. What if I miss a dose? This does not apply. Do not take double or extra doses. What may interact with this medication? Do not take this medication with any of the  following: Cisapride Nitrates like amyl nitrite, isosorbide dinitrate, isosorbide mononitrate, nitroglycerin Riociguat This medication may also interact with the following: Antiviral medications for HIV or AIDS Bosentan Certain medications for benign prostatic hyperplasia (BPH) Certain medications for blood pressure Certain medications for fungal infections like ketoconazole and itraconazole Cimetidine Erythromycin Rifampin This list may not describe all possible interactions. Give your health care provider a list of all the medicines, herbs, non-prescription drugs, or dietary supplements you use. Also tell them if you smoke, drink alcohol, or use illegal drugs. Some items may interact with your medicine. What should I watch for while using this medication? If you notice any changes in your vision while taking this medication, call your care team as soon as possible. Stop using this medication and call your care team right away if you have a loss of sight in one or both eyes. Contact your care team right away if you have an erection that lasts longer than 4 hours or if it becomes painful. This may be a sign of a serious problem and must be treated right away to prevent permanent damage. If you experience symptoms of nausea, dizziness, chest pain or arm pain upon initiation of sexual activity after taking this medication, you should refrain from further activity and call your care team as soon as possible. Do not drink alcohol to excess (examples, 5 glasses of wine or 5 shots of whiskey) when taking this medication. When taken in excess, alcohol can increase your chances of getting a headache or getting dizzy, increasing your heart rate or lowering your blood pressure. Using this medication does not protect you or your partner against HIV infection (the  virus that causes AIDS) or other sexually transmitted diseases. What side effects may I notice from receiving this medication? Side effects that  you should report to your care team as soon as possible: Allergic reactions--skin rash, itching, hives, swelling of the face, lips, tongue, or throat Hearing loss or ringing in ears Heart attack--pain or tightness in the chest, shoulders, arms, or jaw, nausea, shortness of breath, cold or clammy skin, feeling faint or lightheaded Heart rhythm changes--fast or irregular heartbeat, dizziness, feeling faint or lightheaded, chest pain, trouble breathing Low blood pressure--dizziness, feeling faint or lightheaded, blurry vision New or worsening shortness of breath Prolonged or painful erection Stroke--sudden numbness or weakness of the face, arm, or leg, trouble speaking, confusion, trouble walking, loss of balance or coordination, dizziness, severe headache, change in vision Sudden vision loss in one or both eyes Side effects that usually do not require medical attention (report to your care team if they continue or are bothersome): Facial flushing or redness Headache Nosebleed Runny or stuffy nose Trouble sleeping Upset stomach This list may not describe all possible side effects. Call your doctor for medical advice about side effects. You may report side effects to FDA at 1-800-FDA-1088. Where should I keep my medication? Keep out of reach of children and pets. Store at room temperature between 15 and 30 degrees C (59 and 86 degrees F). Throw away any unused medication after the expiration date. NOTE: This sheet is a summary. It may not cover all possible information. If you have questions about this medicine, talk to your doctor, pharmacist, or health care provider.  2023 Elsevier/Gold Standard (2021-01-15 00:00:00)

## 2023-02-17 NOTE — Assessment & Plan Note (Signed)
Lab Results  Component Value Date   HGBA1C 5.9 (A) 02/17/2023   Excellent control. Continue 500mg  metformin every morning, 1000 mg every evening

## 2023-02-17 NOTE — Assessment & Plan Note (Addendum)
Suspect ED secondary to SNRI use. Trial of viagra 25mg .  Counseled on side effects including priapism,flushing, dizziness.  No history of MI.  He does not use nitrates.

## 2023-02-17 NOTE — Progress Notes (Signed)
Assessment & Plan:  Primary hypertension Assessment & Plan: Chronic, stable. Continue amlodipine 10 mg daily, metoprolol 25 mg daily, hydrochlorothiazide 12.5 mg daily, losartan 100 mg daily.   Orders: -     Comprehensive metabolic panel  Elevated glucose -     POCT glycosylated hemoglobin (Hb A1C)  Drug-induced erectile dysfunction Assessment & Plan: Suspect ED secondary to SNRI use. Trial of viagra 25mg .  Counseled on side effects including priapism,flushing, dizziness.  No history of MI.  He does not use nitrates.  Orders: -     Sildenafil Citrate; Take 1 tablet (25 mg total) by mouth daily as needed for erectile dysfunction.  Dispense: 15 tablet; Refill: 3  Controlled type 2 diabetes mellitus without complication, without long-term current use of insulin ([facility]) Assessment & Plan: Lab Results  Component Value Date   HGBA1C 5.9 (A) 02/17/2023   Excellent control. Continue 500mg  metformin every morning, 1000 mg every evening       Return precautions given.   Risks, benefits, and alternatives of the medications and treatment plan prescribed today were discussed, and patient expressed understanding.   Education regarding symptom management and diagnosis given to patient on AVS either electronically or printed.  Return in about 6 months (around 08/20/2023) for Medicare Wellness upcoming or due, schedule.  Larry Paris, FNP  Subjective:    Patient ID: Larry Ramus., male    DOB: 1946/08/02, 77 y.o.   MRN: [id number]  CC: Larry Nikolic. is a 77 y.o. male who presents today for follow up.   HPI: He is compliant with cymbalta 30mg  bid  Previously tried levitra in 2013 for ED.   He complains of erectile dysfunction.   No h/o MI He is not using nitrates.     Denies CP, SOB.   He remains compliant metformin 500 mg every morning, 1000 mg every evening   Allergies: Ibuprofen Current Outpatient Medications on File Prior to Visit  Medication Sig Dispense  Refill   ACCU-CHEK SOFTCLIX LANCETS lancets Use up to 4 times daily to check blood sugar. Diagnosis E11.9 100 each 12   acetaminophen (TYLENOL) 500 MG tablet Take 1,500 mg by mouth 2 (two) times daily.     albuterol (VENTOLIN HFA) 108 (90 Base) MCG/ACT inhaler Inhale 2 puffs into the lungs every 6 (six) hours as needed for wheezing or shortness of breath. 8 g 0   amLODipine (NORVASC) 10 MG tablet TAKE 1 TABLET BY MOUTH DAILY WITH LUNCH. 90 tablet 3   aspirin 81 MG chewable tablet Chew 81 mg by mouth daily. Every Tuesday and Friday     azelastine (ASTELIN) 0.1 % nasal spray Place 1 spray into both nostrils 2 (two) times daily. Use in each nostril as directed 30 mL 4   Cyanocobalamin (B-12) 5000 MCG SUBL Place 5,000 mcg under the tongue daily.      docusate sodium (COLACE) 100 MG capsule Take 100 mg by mouth daily.     DULoxetine (CYMBALTA) 30 MG capsule Take 1 capsule (30 mg total) by mouth 2 (two) times daily. 180 capsule 2   fluticasone (FLONASE) 50 MCG/ACT nasal spray SPRAY 2 SPRAYS INTO EACH NOSTRIL EVERY DAY 48 mL 1   glucose blood (ONETOUCH VERIO) test strip TEST 3 TIMES A DAY 100 each 0   loratadine (CLARITIN) 10 MG tablet Take 10 mg by mouth every morning.     losartan (COZAAR) 100 MG tablet Take 1 tablet (100 mg total) by mouth daily. 90 tablet 3  metFORMIN (GLUCOPHAGE) 500 MG tablet TAKE TABLET BY MOUTH EVERY MORNING WITH A MEAL & TWO TABLETS BY MOUTH EVERY EVENING WITH A MEAL.. 270 tablet 1   metoprolol succinate (TOPROL-XL) 25 MG 24 hr tablet Take 1 tablet (25 mg total) by mouth daily. 90 tablet 3   pravastatin (PRAVACHOL) 40 MG tablet TAKE 1 TABLET BY MOUTH EVERY DAY (Patient taking differently: Take by mouth daily. Patient is taking m-w-f only) 90 tablet 1   tamsulosin (FLOMAX) 0.4 MG CAPS capsule TAKE 2 CAPSULES BY MOUTH EVERY DAY 180 capsule 2   traZODone (DESYREL) 50 MG tablet Take 1 tablet (50 mg total) by mouth at bedtime as needed. for sleep 90 tablet 1   No current  facility-administered medications on file prior to visit.    Review of Systems  Constitutional:  Negative for chills and fever.  Respiratory:  Negative for cough.   Cardiovascular:  Negative for chest pain and palpitations.  Gastrointestinal:  Negative for nausea and vomiting.      Objective:    BP 122/80   Pulse 86   Temp 97.9 F (36.6 C) (Oral)   Ht 6' (1.829 m)   Wt 203 lb (92.1 kg)   SpO2 99%   BMI 27.53 kg/m  BP Readings from Last 3 Encounters:  02/17/23 122/80  12/16/22 (!) 175/90  10/19/22 130/76   Wt Readings from Last 3 Encounters:  02/17/23 203 lb (92.1 kg)  12/16/22 203 lb (92.1 kg)  10/19/22 205 lb 9.6 oz (93.3 kg)    Physical Exam Vitals reviewed.  Constitutional:      Appearance: He is well-developed.  Cardiovascular:     Rate and Rhythm: Regular rhythm.     Heart sounds: Normal heart sounds.  Pulmonary:     Effort: Pulmonary effort is normal. No respiratory distress.     Breath sounds: Normal breath sounds. No wheezing, rhonchi or rales.  Skin:    General: Skin is warm and dry.  Neurological:     Mental Status: He is alert.  Psychiatric:        Speech: Speech normal.        Behavior: Behavior normal.

## 2023-02-17 NOTE — Assessment & Plan Note (Signed)
Chronic, stable. Continue amlodipine 10 mg daily, metoprolol 25 mg daily, hydrochlorothiazide 12.5 mg daily, losartan 100 mg daily.

## 2023-02-28 DIAGNOSIS — M62830 Muscle spasm of back: Secondary | ICD-10-CM | POA: Diagnosis not present

## 2023-02-28 DIAGNOSIS — M9903 Segmental and somatic dysfunction of lumbar region: Secondary | ICD-10-CM | POA: Diagnosis not present

## 2023-02-28 DIAGNOSIS — M5136 Other intervertebral disc degeneration, lumbar region: Secondary | ICD-10-CM | POA: Diagnosis not present

## 2023-02-28 DIAGNOSIS — M5416 Radiculopathy, lumbar region: Secondary | ICD-10-CM | POA: Diagnosis not present

## 2023-03-01 DIAGNOSIS — M19011 Primary osteoarthritis, right shoulder: Secondary | ICD-10-CM | POA: Diagnosis not present

## 2023-03-13 ENCOUNTER — Other Ambulatory Visit: Payer: Self-pay

## 2023-03-13 ENCOUNTER — Encounter: Payer: Self-pay | Admitting: Family

## 2023-03-13 MED ORDER — METFORMIN HCL 500 MG PO TABS: ORAL_TABLET | ORAL | 1 refills | Status: DC

## 2023-03-26 ENCOUNTER — Other Ambulatory Visit: Payer: Self-pay | Admitting: Family

## 2023-03-26 DIAGNOSIS — I10 Essential (primary) hypertension: Secondary | ICD-10-CM

## 2023-03-26 NOTE — Telephone Encounter (Signed)
  The original prescription was discontinued on 02/17/2023 by Allegra Grana, FNP. Renewing this prescription may not be appropriate.

## 2023-03-27 NOTE — Telephone Encounter (Signed)
Spoke to pt and he clarified that he is taking the 12.5 mg Hydrochlorothiazide M,W,& F

## 2023-03-28 DIAGNOSIS — M5416 Radiculopathy, lumbar region: Secondary | ICD-10-CM | POA: Diagnosis not present

## 2023-03-28 DIAGNOSIS — M9903 Segmental and somatic dysfunction of lumbar region: Secondary | ICD-10-CM | POA: Diagnosis not present

## 2023-03-28 DIAGNOSIS — M62830 Muscle spasm of back: Secondary | ICD-10-CM | POA: Diagnosis not present

## 2023-03-28 DIAGNOSIS — M5136 Other intervertebral disc degeneration, lumbar region: Secondary | ICD-10-CM | POA: Diagnosis not present

## 2023-03-28 NOTE — Telephone Encounter (Signed)
LVM TO CALL BACK

## 2023-03-28 NOTE — Telephone Encounter (Signed)
Spoke to pt and he recalled conversation from a while back that he does not feel like he needs to continue with the medication but if he does need to restart it then he will. Pt is ok with discontinuing this medication

## 2023-04-04 ENCOUNTER — Encounter: Payer: PPO | Admitting: *Deleted

## 2023-04-06 ENCOUNTER — Ambulatory Visit (INDEPENDENT_AMBULATORY_CARE_PROVIDER_SITE_OTHER): Payer: PPO | Admitting: *Deleted

## 2023-04-06 DIAGNOSIS — Z0000 Encounter for general adult medical examination without abnormal findings: Secondary | ICD-10-CM | POA: Diagnosis not present

## 2023-04-06 NOTE — Progress Notes (Signed)
Subjective:   Larry Gomer. is a 77 y.o. male who presents for Medicare Annual/Subsequent preventive examination.  I connected with  Staci Righter. on 04/06/23 by a telephone enabled telemedicine application and verified that I am speaking with the correct person using two identifiers.   I discussed the limitations of evaluation and management by telemedicine. The patient expressed understanding and agreed to proceed.  Patient location: home  Provider location: telephone/home    Review of Systems     Cardiac Risk Factors include: advanced age (>76men, >76 women);hypertension;male gender;diabetes mellitus;obesity (BMI >30kg/m2)     Objective:    There were no vitals filed for this visit. There is no height or weight on file to calculate BMI.     04/06/2023    9:46 AM 03/31/2022    9:22 AM 08/24/2021    6:32 AM 08/20/2021   11:22 AM 01/11/2021   11:16 AM 08/15/2019    2:00 PM 08/15/2019    6:50 AM  Advanced Directives  Does Patient Have a Medical Advance Directive? Yes Yes No Yes Yes Yes Yes  Type of Advance Directive Living will Healthcare Power of [place];Living will   Healthcare Power of [place];Living will Healthcare Power of [place];Living will Healthcare Power of [place];Living will  Does patient want to make changes to medical advance directive?  No - Patient declined   No - Patient declined No - Patient declined No - Patient declined  Copy of Healthcare Power of Attorney in Chart?  No - copy requested   No - copy requested No - copy requested No - copy requested  Would patient like information on creating a medical advance directive?   No - Patient declined        Current Medications (verified) Outpatient Encounter Medications as of 04/06/2023  Medication Sig   ACCU-CHEK SOFTCLIX LANCETS lancets Use up to 4 times daily to check blood sugar. Diagnosis E11.9   acetaminophen (TYLENOL) 500 MG tablet Take 1,500 mg by mouth 2 (two) times daily.   albuterol (VENTOLIN  HFA) 108 (90 Base) MCG/ACT inhaler Inhale 2 puffs into the lungs every 6 (six) hours as needed for wheezing or shortness of breath.   amLODipine (NORVASC) 10 MG tablet TAKE 1 TABLET BY MOUTH DAILY WITH LUNCH.   aspirin 81 MG chewable tablet Chew 81 mg by mouth daily. Every Tuesday and Friday   azelastine (ASTELIN) 0.1 % nasal spray Place 1 spray into both nostrils 2 (two) times daily. Use in each nostril as directed   Cyanocobalamin (B-12) 5000 MCG SUBL Place 5,000 mcg under the tongue daily.    docusate sodium (COLACE) 100 MG capsule Take 100 mg by mouth daily.   DULoxetine (CYMBALTA) 30 MG capsule Take 1 capsule (30 mg total) by mouth 2 (two) times daily.   fluticasone (FLONASE) 50 MCG/ACT nasal spray SPRAY 2 SPRAYS INTO EACH NOSTRIL EVERY DAY   glucose blood (ONETOUCH VERIO) test strip TEST 3 TIMES A DAY   loratadine (CLARITIN) 10 MG tablet Take 10 mg by mouth every morning.   losartan (COZAAR) 100 MG tablet Take 1 tablet (100 mg total) by mouth daily.   metoprolol succinate (TOPROL-XL) 25 MG 24 hr tablet Take 1 tablet (25 mg total) by mouth daily.   pravastatin (PRAVACHOL) 40 MG tablet TAKE 1 TABLET BY MOUTH EVERY DAY (Patient taking differently: Take by mouth daily. Patient is taking m-w-f only)   sildenafil (VIAGRA) 25 MG tablet Take 1 tablet (25 mg total) by mouth daily  as needed for erectile dysfunction.   tamsulosin (FLOMAX) 0.4 MG CAPS capsule TAKE 2 CAPSULES BY MOUTH EVERY DAY   traZODone (DESYREL) 50 MG tablet Take 1 tablet (50 mg total) by mouth at bedtime as needed. for sleep   metFORMIN (GLUCOPHAGE) 500 MG tablet TAKE TABLET BY MOUTH EVERY MORNING WITH A MEAL & TWO TABLETS BY MOUTH EVERY EVENING WITH A MEAL.Marland Kitchen   No facility-administered encounter medications on file as of 04/06/2023.    Allergies (verified) Ibuprofen   History: Past Medical History:  Diagnosis Date   Arthritis    Depression    DM (diabetes mellitus) (HCC)    Extrinsic asthma, unspecified    childhood    GERD (gastroesophageal reflux disease)    rare   History of kidney stones    Hives of unknown origin    HTN (hypertension)    Other allergy, other than to medicinal agents    RBBB    Sleep apnea    25 years ago mild lost weight no longer uses cpap   Wound of right leg 08/2018   area just below knee size of quarter, reddened around edges   Past Surgical History:  Procedure Laterality Date   BACK SURGERY  1995   ruptured disc encapsulated nerves   CATARACT EXTRACTION, BILATERAL  2016   Dr. Bryon Lions at [facility]   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   Procedure: TOTAL KNEE ARTHROPLASTY;  Surgeon: Juanell Fairly, MD;  Location: [facility] OR[facility];  Service: Orthopedics;  Laterality: Right;   TOTAL KNEE ARTHROPLASTY Left 08/15/2019   Procedure: TOTAL KNEE ARTHROPLASTY;  Surgeon: Juanell Fairly, MD;  Location: [facility] OR[facility];  Service: Orthopedics;  Laterality: Left;   Family History  Problem Relation Age of Onset   Heart failure Father    Heart attack Mother    Social History   Socioeconomic History   Marital status: Married    Spouse name: Pam   Number of children: 2   Years of education: Not on file   Highest education level: Not on file  Occupational History    Employer: [employer]    Comment: retired  Tobacco Use   Smoking status: Never   Smokeless tobacco: Never   Tobacco comments:    tobacco use - no  Vaping Use   Vaping Use: Never used  Substance and Sexual Activity   Alcohol use: Yes    Comment: 6   Drug use: No   Sexual activity: Yes  Other Topics Concern   Not on file  Social History Narrative    Not on file   Social Determinants of Health   Financial Resource Strain: Low Risk  (04/06/2023)   Overall Financial Resource Strain (CARDIA)    Difficulty of Paying Living Expenses: Not hard at all  Food Insecurity: No Food Insecurity (04/06/2023)   Hunger Vital Sign    Worried About Running Out of Food in the Last Year: Never true    Ran Out of Food in the Last Year: Never true  Transportation Needs: No Transportation Needs (04/06/2023)   PRAPARE - Administrator, Civil Service (Medical): No  Lack of Transportation (Non-Medical): No  Physical Activity: Inactive (04/06/2023)   Exercise Vital Sign    Days of Exercise per Week: 0 days    Minutes of Exercise per Session: 0 min  Stress: No Stress Concern Present (04/06/2023)   [employer] of Occupational Health - Occupational Stress Questionnaire    Feeling of Stress : Not at all  Social Connections: Moderately Integrated (04/06/2023)   Social Connection and Isolation Panel [NHANES]    Frequency of Communication with Friends and Family: More than three times a week    Frequency of Social Gatherings with Friends and Family: Three times a week    Attends Religious Services: More than 4 times per year    Active Member of Clubs or Organizations: No    Attends Banker Meetings: Never    Marital Status: Married    Tobacco Counseling Counseling given: Not Answered Tobacco comments: tobacco use - no   Clinical Intake:  Pre-visit preparation completed: Yes  Pain : No/denies pain     Nutritional Risks: None Diabetes: Yes CBG done?: No Did pt. bring in CBG monitor from home?: No  How often do you need to have someone help you when you read instructions, pamphlets, or other written materials from your doctor or pharmacy?: 1 - Never  Diabetic?  Yes  Nutrition Risk Assessment:  Has the patient had any N/V/D within the last 2 months?  No  Does the patient have any non-healing wounds?  No  Has the patient  had any unintentional weight loss or weight gain?  No   Diabetes:  Is the patient diabetic?  Yes  If diabetic, was a CBG obtained today?  No  Did the patient bring in their glucometer from home?  No  How often do you monitor your CBG's? Every once in awhile.   Financial Strains and Diabetes Management:  Are you having any financial strains with the device, your supplies or your medication? No .  Does the patient want to be seen by Chronic Care Management for management of their diabetes?  No  Would the patient like to be referred to a Nutritionist or for Diabetic Management?  No   Diabetic Exams:  Diabetic Eye Exam: Completed .Marland Kitchen Pt has been advised about the importance in completing this exam. A referral has been placed today. Message sent to referral coordinator for scheduling purposes. Advised pt to expect a call from office referred to regarding appt.  Diabetic Foot Exam:  Pt has been advised about the importance in completing this exam.   Interpreter Needed?: No  Information entered by :: Remi Haggard LPN   Activities of Daily Living    04/06/2023    9:37 AM  In your present state of health, do you have any difficulty performing the following activities:  Hearing? 0  Vision? 0  Difficulty concentrating or making decisions? 0  Walking or climbing stairs? 0  Dressing or bathing? 0  Doing errands, shopping? 0  Preparing Food and eating ? N  Using the Toilet? N  In the past six months, have you accidently leaked urine? N  Do you have problems with loss of bowel control? N  Managing your Medications? N  Housekeeping or managing your Housekeeping? N    Patient Care Team: Allegra Grana, FNP as PCP - General (Family Medicine)  Indicate any recent Medical Services you may have received from other than [facility] providers in the past year (date may be approximate).  Assessment:   This is a routine wellness examination for [facility].  Hearing/Vision screen Hearing  Screening - Comments:: No trouble hearing Vision Screening - Comments:: [facility] Up to date  Dietary issues and exercise activities discussed: Current Exercise Habits: The patient does not participate in regular exercise at present   Goals Addressed             This Visit's Progress    increase lean proteins   Not on track    Reduce fast food intake  Low carb foods     Weight (lb) < 200 lb (90.7 kg)         Depression Screen    04/06/2023    9:40 AM 02/17/2023    9:41 AM 10/19/2022    9:48 AM 08/26/2022    9:15 AM 06/10/2022    9:17 AM 03/31/2022    9:21 AM 03/10/2022   10:12 AM  PHQ 2/9 Scores  PHQ - 2 Score 0 0 0 0 0 0 0  PHQ- 9 Score 2  0 0  0 0    Fall Risk    04/06/2023    9:37 AM 02/17/2023    9:40 AM 10/19/2022    9:48 AM 08/26/2022    9:15 AM 06/10/2022    9:16 AM  Fall Risk   Falls in the past year? 0 0 0 0 0  Number falls in past yr: 0 0 0 0 0  Injury with Fall? 0 0 0 0 0  Risk for fall due to :  No Fall Risks No Fall Risks No Fall Risks No Fall Risks  Follow up Falls evaluation completed;Education provided;Falls prevention discussed Falls evaluation completed Falls evaluation completed Falls evaluation completed Falls evaluation completed    FALL RISK PREVENTION PERTAINING TO THE HOME:  Any stairs in or around the home? No  If so, are there any without handrails? No  Home free of loose throw rugs in walkways, pet beds, electrical cords, etc? Yes  Adequate lighting in your home to reduce risk of falls? Yes   ASSISTIVE DEVICES UTILIZED TO PREVENT FALLS:  Life alert? No  Use of a cane, walker or w/c? No  Grab bars in the bathroom? Yes  Shower chair or bench in shower? Yes  Elevated toilet seat or a handicapped toilet? Yes   TIMED UP AND GO:  Was the test performed? No .    Cognitive Function:    04/25/2018    2:39 PM 04/24/2017    2:31 PM 04/22/2016    2:15 PM  MMSE - Mini Mental State Exam  Orientation to time 5 5 5   Orientation to Place 5 5  5   Registration 3 3 3   Attention/ Calculation 5 5 5   Recall 3 3 3   Language- name 2 objects 2 2 2   Language- repeat 1 1 1   Language- follow 3 step command 3 3 3   Language- read & follow direction 1 1 1   Write a sentence 1 1 1   Copy design 1 1 1   Total score 30 30 30         04/06/2023    9:38 AM  6CIT Screen  What Year? 0 points  What month? 0 points  What time? 0 points  Count back from 20 0 points  Months in reverse 0 points  Repeat phrase 0 points  Total Score 0 points    Immunizations Immunization History  Administered Date(s) Administered   Fluad Quad(high Dose 65+) 10/30/2019, 09/21/2020, 09/13/2021, 10/19/2022  Influenza Split 11/10/2011, 09/10/2012   Influenza, High Dose Seasonal PF 09/11/2017, 09/20/2018   Influenza,inj,Quad PF,6+ Mos 10/02/2013, 08/30/2014, 09/14/2015, 08/25/2016   PFIZER(Purple Top)SARS-COV-2 Vaccination 02/05/2020, 02/26/2020, 10/25/2020   Pfizer Covid-19 Vaccine Bivalent Booster 40yrs & up 12/19/2021   Pfizer Fall 2023 Covid-19 Vaccine 6mos thru 15yrs  11/02/2022   Pneumococcal Conjugate-13 01/14/2014   Pneumococcal Polysaccharide-23 07/16/2012, 06/25/2019   Tdap 11/10/2011   Varicella 11/16/2011   Zoster, Live 12/01/2011    TDAP status: Due, Education has been provided regarding the importance of this vaccine. Advised may receive this vaccine at local pharmacy or Health Dept. Aware to provide a copy of the vaccination record if obtained from local pharmacy or Health Dept. Verbalized acceptance and understanding.  Flu Vaccine status: Up to date  Pneumococcal vaccine status: Up to date  Covid-19 vaccine status: Information provided on how to obtain vaccines.   Qualifies for Shingles Vaccine? Yes   Zostavax completed Yes   Shingrix Completed?: No.    Education has been provided regarding the importance of this vaccine. Patient has been advised to call insurance company to determine out of pocket expense if they have not yet received this  vaccine. Advised may also receive vaccine at local pharmacy or Health Dept. Verbalized acceptance and understanding.  Screening Tests Health Maintenance  Topic Date Due   DTaP/Tdap/Td (2 - Td or Tdap) 11/09/2021   COVID-19 Vaccine (5 - 2023-24 season) 04/22/2023 (Originally 11/02/2022)   Zoster Vaccines- Shingrix (1 of 2) 05/20/2023 (Originally 07/06/1997)   Diabetic kidney evaluation - Urine ACR  06/11/2023   FOOT EXAM  06/11/2023   INFLUENZA VACCINE  07/06/2023   HEMOGLOBIN A1C  08/20/2023   OPHTHALMOLOGY EXAM  01/13/2024   Diabetic kidney evaluation - eGFR measurement  02/17/2024   Medicare Annual Wellness (AWV)  04/05/2024   Pneumonia Vaccine 26+ Years old  Completed   HPV VACCINES  Aged Out   COLONOSCOPY (Pts 45-103yrs Insurance coverage will need to be confirmed)  Discontinued   Hepatitis C Screening  Discontinued    Health Maintenance  Health Maintenance Due  Topic Date Due   DTaP/Tdap/Td (2 - Td or Tdap) 11/09/2021    Colorectal cancer screening: No longer required.   Lung Cancer Screening: (Low Dose CT Chest recommended if Age 65-80 years, 30 pack-year currently smoking OR have quit w/in 15years.) does not qualify.   Lung Cancer Screening Referral:   Additional Screening:  Hepatitis C Screening: does qualify; never done  Vision Screening: Recommended annual ophthalmology exams for early detection of glaucoma and other disorders of the eye. Is the patient up to date with their annual eye exam?  Yes  Who is the provider or what is the name of the office in which the patient attends annual eye exams? [facility] If pt is not established with a provider, would they like to be referred to a provider to establish care? No .   Dental Screening: Recommended annual dental exams for proper oral hygiene  Community Resource Referral / Chronic Care Management: CRR required this visit?  No   CCM required this visit?  No      Plan:     I have personally reviewed and  noted the following in the patient's chart:   Medical and social history Use of alcohol, tobacco or illicit drugs  Current medications and supplements including opioid prescriptions. Patient is not currently taking opioid prescriptions. Functional ability and status Nutritional status Physical activity Advanced directives List of other physicians Hospitalizations, surgeries, and  ER visits in previous 12 months Vitals Screenings to include cognitive, depression, and falls Referrals and appointments  In addition, I have reviewed and discussed with patient certain preventive protocols, quality metrics, and best practice recommendations. A written personalized care plan for preventive services as well as general preventive health recommendations were provided to patient.     Remi Haggard, LPN   12/10/1094   Nurse Notes:

## 2023-04-06 NOTE — Patient Instructions (Signed)
Larry Roman , Thank you for taking time to come for your Medicare Wellness Visit. I appreciate your ongoing commitment to your health goals. Please review the following plan we discussed and let me know if I can assist you in the future.   Screening recommendations/referrals: Colonoscopy: Education provided Recommended yearly ophthalmology/optometry visit for glaucoma screening and checkup Recommended yearly dental visit for hygiene and checkup  Vaccinations: Influenza vaccine: up to date Pneumococcal vaccine: up to date Tdap vaccine: Education provided Shingles vaccine: Education provided    Advanced directives: living will not on file     Preventive Care 76 Years and Older, Male Preventive care refers to lifestyle choices and visits with your health care provider that can promote health and wellness. What does preventive care include? A yearly physical exam. This is also called an annual well check. Dental exams once or twice a year. Routine eye exams. Ask your health care provider how often you should have your eyes checked. Personal lifestyle choices, including: Daily care of your teeth and gums. Regular physical activity. Eating a healthy diet. Avoiding tobacco and drug use. Limiting alcohol use. Practicing safe sex. Taking low doses of aspirin every day. Taking vitamin and mineral supplements as recommended by your health care provider. What happens during an annual well check? The services and screenings done by your health care provider during your annual well check will depend on your age, overall health, lifestyle risk factors, and family history of disease. Counseling  Your health care provider may ask you questions about your: Alcohol use. Tobacco use. Drug use. Emotional well-being. Home and relationship well-being. Sexual activity. Eating habits. History of falls. Memory and ability to understand (cognition). Work and work Astronomer. Screening  You may  have the following tests or measurements: Height, weight, and BMI. Blood pressure. Lipid and cholesterol levels. These may be checked every 5 years, or more frequently if you are over 76 years old. Skin check. Lung cancer screening. You may have this screening every year starting at age 76 if you have a 30-pack-year history of smoking and currently smoke or have quit within the past 15 years. Fecal occult blood test (FOBT) of the stool. You may have this test every year starting at age 76. Flexible sigmoidoscopy or colonoscopy. You may have a sigmoidoscopy every 5 years or a colonoscopy every 10 years starting at age 76. Prostate cancer screening. Recommendations will vary depending on your family history and other risks. Hepatitis C blood test. Hepatitis B blood test. Sexually transmitted disease (STD) testing. Diabetes screening. This is done by checking your blood sugar (glucose) after you have not eaten for a while (fasting). You may have this done every 1-3 years. Abdominal aortic aneurysm (AAA) screening. You may need this if you are a current or former smoker. Osteoporosis. You may be screened starting at age 76 if you are at high risk. Talk with your health care provider about your test results, treatment options, and if necessary, the need for more tests. Vaccines  Your health care provider may recommend certain vaccines, such as: Influenza vaccine. This is recommended every year. Tetanus, diphtheria, and acellular pertussis (Tdap, Td) vaccine. You may need a Td booster every 10 years. Zoster vaccine. You may need this after age 3. Pneumococcal 13-valent conjugate (PCV13) vaccine. One dose is recommended after age 76. Pneumococcal polysaccharide (PPSV23) vaccine. One dose is recommended after age 76. Talk to your health care provider about which screenings and vaccines you need and how often you need  them. This information is not intended to replace advice given to you by your  health care provider. Make sure you discuss any questions you have with your health care provider. Document Released: 12/18/2015 Document Revised: 08/10/2016 Document Reviewed: 09/22/2015 Elsevier Interactive Patient Education  2017 [place] Prevention in the Home Falls can cause injuries. They can happen to people of all ages. There are many things you can do to make your home safe and to help prevent falls. What can I do on the outside of my home? Regularly fix the edges of walkways and driveways and fix any cracks. Remove anything that might make you trip as you walk through a door, such as a raised step or threshold. Trim any bushes or trees on the path to your home. Use bright outdoor lighting. Clear any walking paths of anything that might make someone trip, such as rocks or tools. Regularly check to see if handrails are loose or broken. Make sure that both sides of any steps have handrails. Any raised decks and porches should have guardrails on the edges. Have any leaves, snow, or ice cleared regularly. Use sand or salt on walking paths during winter. Clean up any spills in your garage right away. This includes oil or grease spills. What can I do in the bathroom? Use night lights. Install grab bars by the toilet and in the tub and shower. Do not use towel bars as grab bars. Use non-skid mats or decals in the tub or shower. If you need to sit down in the shower, use a plastic, non-slip stool. Keep the floor dry. Clean up any water that spills on the floor as soon as it happens. Remove soap buildup in the tub or shower regularly. Attach bath mats securely with double-sided non-slip rug tape. Do not have throw rugs and other things on the floor that can make you trip. What can I do in the bedroom? Use night lights. Make sure that you have a light by your bed that is easy to reach. Do not use any sheets or blankets that are too big for your bed. They should not hang down  onto the floor. Have a firm chair that has side arms. You can use this for support while you get dressed. Do not have throw rugs and other things on the floor that can make you trip. What can I do in the kitchen? Clean up any spills right away. Avoid walking on wet floors. Keep items that you use a lot in easy-to-reach places. If you need to reach something above you, use a strong step stool that has a grab bar. Keep electrical cords out of the way. Do not use floor polish or wax that makes floors slippery. If you must use wax, use non-skid floor wax. Do not have throw rugs and other things on the floor that can make you trip. What can I do with my stairs? Do not leave any items on the stairs. Make sure that there are handrails on both sides of the stairs and use them. Fix handrails that are broken or loose. Make sure that handrails are as long as the stairways. Check any carpeting to make sure that it is firmly attached to the stairs. Fix any carpet that is loose or worn. Avoid having throw rugs at the top or bottom of the stairs. If you do have throw rugs, attach them to the floor with carpet tape. Make sure that you have a light switch at  the top of the stairs and the bottom of the stairs. If you do not have them, ask someone to add them for you. What else can I do to help prevent falls? Wear shoes that: Do not have high heels. Have rubber bottoms. Are comfortable and fit you well. Are closed at the toe. Do not wear sandals. If you use a stepladder: Make sure that it is fully opened. Do not climb a closed stepladder. Make sure that both sides of the stepladder are locked into place. Ask someone to hold it for you, if possible. Clearly mark and make sure that you can see: Any grab bars or handrails. First and last steps. Where the edge of each step is. Use tools that help you move around (mobility aids) if they are needed. These include: Canes. Walkers. Scooters. Crutches. Turn  on the lights when you go into a dark area. Replace any light bulbs as soon as they burn out. Set up your furniture so you have a clear path. Avoid moving your furniture around. If any of your floors are uneven, fix them. If there are any pets around you, be aware of where they are. Review your medicines with your doctor. Some medicines can make you feel dizzy. This can increase your chance of falling. Ask your doctor what other things that you can do to help prevent falls. This information is not intended to replace advice given to you by your health care provider. Make sure you discuss any questions you have with your health care provider. Document Released: 09/17/2009 Document Revised: 04/28/2016 Document Reviewed: 12/26/2014 Elsevier Interactive Patient Education  2017 [employer].

## 2023-04-25 DIAGNOSIS — M5136 Other intervertebral disc degeneration, lumbar region: Secondary | ICD-10-CM | POA: Diagnosis not present

## 2023-04-25 DIAGNOSIS — M62830 Muscle spasm of back: Secondary | ICD-10-CM | POA: Diagnosis not present

## 2023-04-25 DIAGNOSIS — M9903 Segmental and somatic dysfunction of lumbar region: Secondary | ICD-10-CM | POA: Diagnosis not present

## 2023-04-25 DIAGNOSIS — M5416 Radiculopathy, lumbar region: Secondary | ICD-10-CM | POA: Diagnosis not present

## 2023-05-08 ENCOUNTER — Other Ambulatory Visit: Payer: Self-pay | Admitting: Family

## 2023-05-08 DIAGNOSIS — F339 Major depressive disorder, recurrent, unspecified: Secondary | ICD-10-CM

## 2023-05-09 ENCOUNTER — Encounter: Payer: Self-pay | Admitting: Family

## 2023-05-10 ENCOUNTER — Telehealth: Payer: Self-pay | Admitting: Family

## 2023-05-10 NOTE — Telephone Encounter (Signed)
Patient dropped off document Handicap Placard, to be filled out by provider. Patient requested to send it via Call Patient to pick up within 7-days. Document is located in providers color folder at front office.Please advise at [facility] [phone]

## 2023-05-11 NOTE — Telephone Encounter (Signed)
Placed form in pcp folder to be signed. Pcp signed form pt was notified and will pickup on 05/11/23. Placed up front for pt pickup

## 2023-05-17 ENCOUNTER — Other Ambulatory Visit: Payer: Self-pay | Admitting: Family

## 2023-05-17 ENCOUNTER — Other Ambulatory Visit: Payer: Self-pay | Admitting: Urology

## 2023-05-17 DIAGNOSIS — R0981 Nasal congestion: Secondary | ICD-10-CM

## 2023-05-17 DIAGNOSIS — N400 Enlarged prostate without lower urinary tract symptoms: Secondary | ICD-10-CM

## 2023-05-23 DIAGNOSIS — M5416 Radiculopathy, lumbar region: Secondary | ICD-10-CM | POA: Diagnosis not present

## 2023-05-23 DIAGNOSIS — M9903 Segmental and somatic dysfunction of lumbar region: Secondary | ICD-10-CM | POA: Diagnosis not present

## 2023-05-23 DIAGNOSIS — M62830 Muscle spasm of back: Secondary | ICD-10-CM | POA: Diagnosis not present

## 2023-05-23 DIAGNOSIS — M5136 Other intervertebral disc degeneration, lumbar region: Secondary | ICD-10-CM | POA: Diagnosis not present

## 2023-06-03 ENCOUNTER — Encounter: Payer: Self-pay | Admitting: Family

## 2023-06-20 DIAGNOSIS — M5136 Other intervertebral disc degeneration, lumbar region: Secondary | ICD-10-CM | POA: Diagnosis not present

## 2023-06-20 DIAGNOSIS — M5416 Radiculopathy, lumbar region: Secondary | ICD-10-CM | POA: Diagnosis not present

## 2023-06-20 DIAGNOSIS — M62830 Muscle spasm of back: Secondary | ICD-10-CM | POA: Diagnosis not present

## 2023-06-20 DIAGNOSIS — M9903 Segmental and somatic dysfunction of lumbar region: Secondary | ICD-10-CM | POA: Diagnosis not present

## 2023-07-03 ENCOUNTER — Encounter: Payer: Self-pay | Admitting: Family

## 2023-07-03 ENCOUNTER — Encounter: Payer: Self-pay | Admitting: Urology

## 2023-07-08 ENCOUNTER — Other Ambulatory Visit: Payer: Self-pay | Admitting: Family

## 2023-07-08 DIAGNOSIS — I10 Essential (primary) hypertension: Secondary | ICD-10-CM

## 2023-07-18 DIAGNOSIS — M62830 Muscle spasm of back: Secondary | ICD-10-CM | POA: Diagnosis not present

## 2023-07-18 DIAGNOSIS — M9903 Segmental and somatic dysfunction of lumbar region: Secondary | ICD-10-CM | POA: Diagnosis not present

## 2023-07-18 DIAGNOSIS — M5136 Other intervertebral disc degeneration, lumbar region: Secondary | ICD-10-CM | POA: Diagnosis not present

## 2023-07-18 DIAGNOSIS — M5416 Radiculopathy, lumbar region: Secondary | ICD-10-CM | POA: Diagnosis not present

## 2023-07-20 ENCOUNTER — Encounter (INDEPENDENT_AMBULATORY_CARE_PROVIDER_SITE_OTHER): Payer: Self-pay

## 2023-07-31 ENCOUNTER — Other Ambulatory Visit: Payer: Self-pay | Admitting: Family

## 2023-07-31 ENCOUNTER — Encounter: Payer: Self-pay | Admitting: Urology

## 2023-07-31 DIAGNOSIS — E785 Hyperlipidemia, unspecified: Secondary | ICD-10-CM

## 2023-08-12 ENCOUNTER — Encounter: Payer: Self-pay | Admitting: Family

## 2023-08-14 ENCOUNTER — Other Ambulatory Visit: Payer: Self-pay

## 2023-08-14 ENCOUNTER — Other Ambulatory Visit: Payer: Self-pay | Admitting: Family

## 2023-08-14 DIAGNOSIS — E785 Hyperlipidemia, unspecified: Secondary | ICD-10-CM

## 2023-08-14 MED ORDER — PRAVASTATIN SODIUM 40 MG PO TABS
ORAL_TABLET | ORAL | 3 refills | Status: DC
Start: 2023-08-14 — End: 2023-12-11

## 2023-08-14 NOTE — Telephone Encounter (Signed)
Rx sent in to pharmacy pt is aware 

## 2023-08-15 DIAGNOSIS — M5416 Radiculopathy, lumbar region: Secondary | ICD-10-CM | POA: Diagnosis not present

## 2023-08-15 DIAGNOSIS — M62830 Muscle spasm of back: Secondary | ICD-10-CM | POA: Diagnosis not present

## 2023-08-15 DIAGNOSIS — M9903 Segmental and somatic dysfunction of lumbar region: Secondary | ICD-10-CM | POA: Diagnosis not present

## 2023-08-15 DIAGNOSIS — M5136 Other intervertebral disc degeneration, lumbar region: Secondary | ICD-10-CM | POA: Diagnosis not present

## 2023-08-21 ENCOUNTER — Ambulatory Visit: Payer: PPO | Admitting: Family

## 2023-09-27 DIAGNOSIS — N200 Calculus of kidney: Secondary | ICD-10-CM | POA: Diagnosis not present

## 2023-09-27 DIAGNOSIS — R809 Proteinuria, unspecified: Secondary | ICD-10-CM | POA: Diagnosis not present

## 2023-09-27 DIAGNOSIS — N400 Enlarged prostate without lower urinary tract symptoms: Secondary | ICD-10-CM | POA: Diagnosis not present

## 2023-09-27 DIAGNOSIS — K219 Gastro-esophageal reflux disease without esophagitis: Secondary | ICD-10-CM | POA: Diagnosis not present

## 2023-09-27 DIAGNOSIS — I10 Essential (primary) hypertension: Secondary | ICD-10-CM | POA: Diagnosis not present

## 2023-09-27 DIAGNOSIS — E785 Hyperlipidemia, unspecified: Secondary | ICD-10-CM | POA: Diagnosis not present

## 2023-09-27 DIAGNOSIS — Z8739 Personal history of other diseases of the musculoskeletal system and connective tissue: Secondary | ICD-10-CM | POA: Diagnosis not present

## 2023-09-27 DIAGNOSIS — N281 Cyst of kidney, acquired: Secondary | ICD-10-CM | POA: Diagnosis not present

## 2023-10-02 ENCOUNTER — Encounter: Payer: Self-pay | Admitting: Urology

## 2023-10-03 DIAGNOSIS — E785 Hyperlipidemia, unspecified: Secondary | ICD-10-CM | POA: Diagnosis not present

## 2023-10-03 DIAGNOSIS — I10 Essential (primary) hypertension: Secondary | ICD-10-CM | POA: Diagnosis not present

## 2023-10-03 DIAGNOSIS — R809 Proteinuria, unspecified: Secondary | ICD-10-CM | POA: Diagnosis not present

## 2023-10-03 DIAGNOSIS — N400 Enlarged prostate without lower urinary tract symptoms: Secondary | ICD-10-CM | POA: Diagnosis not present

## 2023-10-03 DIAGNOSIS — K219 Gastro-esophageal reflux disease without esophagitis: Secondary | ICD-10-CM | POA: Diagnosis not present

## 2023-10-03 DIAGNOSIS — E119 Type 2 diabetes mellitus without complications: Secondary | ICD-10-CM | POA: Diagnosis not present

## 2023-10-03 DIAGNOSIS — Z8739 Personal history of other diseases of the musculoskeletal system and connective tissue: Secondary | ICD-10-CM | POA: Diagnosis not present

## 2023-10-03 DIAGNOSIS — N281 Cyst of kidney, acquired: Secondary | ICD-10-CM | POA: Diagnosis not present

## 2023-10-03 DIAGNOSIS — N200 Calculus of kidney: Secondary | ICD-10-CM | POA: Diagnosis not present

## 2023-10-19 ENCOUNTER — Other Ambulatory Visit: Payer: Self-pay | Admitting: Family

## 2023-10-19 ENCOUNTER — Ambulatory Visit: Payer: PPO | Admitting: Family

## 2023-10-19 ENCOUNTER — Encounter: Payer: Self-pay | Admitting: Family

## 2023-10-19 VITALS — BP 128/78 | HR 61 | Temp 97.7°F | Ht 73.0 in | Wt 204.6 lb

## 2023-10-19 DIAGNOSIS — I10 Essential (primary) hypertension: Secondary | ICD-10-CM | POA: Diagnosis not present

## 2023-10-19 DIAGNOSIS — E119 Type 2 diabetes mellitus without complications: Secondary | ICD-10-CM

## 2023-10-19 DIAGNOSIS — Z23 Encounter for immunization: Secondary | ICD-10-CM | POA: Diagnosis not present

## 2023-10-19 DIAGNOSIS — F339 Major depressive disorder, recurrent, unspecified: Secondary | ICD-10-CM

## 2023-10-19 DIAGNOSIS — R0981 Nasal congestion: Secondary | ICD-10-CM

## 2023-10-19 DIAGNOSIS — R7309 Other abnormal glucose: Secondary | ICD-10-CM

## 2023-10-19 LAB — POCT GLYCOSYLATED HEMOGLOBIN (HGB A1C): Hemoglobin A1C: 6 % — AB (ref 4.0–5.6)

## 2023-10-19 LAB — LIPID PANEL
Cholesterol: 133 mg/dL (ref 0–200)
HDL: 44.8 mg/dL (ref >39.00)
LDL Cholesterol: 52 mg/dL (ref 0–99)
NonHDL: 88.09
Total CHOL/HDL Ratio: 3
Triglycerides: 179 mg/dL — ABNORMAL HIGH (ref 0.0–149.0)
VLDL: 35.8 mg/dL (ref 0.0–40.0)

## 2023-10-19 MED ORDER — AZELASTINE-FLUTICASONE 137-50 MCG/ACT NA SUSP: 1.0000 | Freq: Two times a day (BID) | NASAL | 8 refills | Status: DC

## 2023-10-19 MED ORDER — METFORMIN HCL 500 MG PO TABS: ORAL_TABLET | ORAL | 1 refills | Status: DC

## 2023-10-19 MED ORDER — TRAZODONE HCL 100 MG PO TABS: 100.0000 mg | ORAL_TABLET | Freq: Every day | ORAL | 3 refills | Status: DC

## 2023-10-19 MED ORDER — ALBUTEROL SULFATE HFA 108 (90 BASE) MCG/ACT IN AERS: 2.0000 | INHALATION_SPRAY | Freq: Four times a day (QID) | RESPIRATORY_TRACT | 0 refills | Status: DC | PRN

## 2023-10-19 NOTE — Patient Instructions (Signed)
Stop individual flonase and azelastine; I have prescribed a combination  Stop trazodone 50mg  and start trazodone 100mg 

## 2023-10-19 NOTE — Progress Notes (Signed)
Assessment & Plan:  Depression, recurrent (HCC) Assessment & Plan: Chronic, stable.  Continue Cymbalta 60 mg, trazodone 50 mg qpm  Orders: -     traZODone HCl; Take 1 tablet (100 mg total) by mouth at bedtime. for sleep  Dispense: 90 tablet; Refill: 3  Elevated glucose -     POCT glycosylated hemoglobin (Hb A1C)  Nasal congestion -     Albuterol Sulfate HFA; Inhale 2 puffs into the lungs every 6 (six) hours as needed for wheezing or shortness of breath.  Dispense: 8 g; Refill: 0 -     Azelastine-Fluticasone; Place 1 spray into the nose every 12 (twelve) hours.  Dispense: 23 g; Refill: 8  Controlled type 2 diabetes mellitus without complication, without long-term current use of insulin (HCC) Assessment & Plan: Lab Results  Component Value Date   HGBA1C 6.0 (A) 10/19/2023  Slight increase of A1c Excellent control. Continue 500mg  metformin every morning, 1000 mg every evening   Orders: -     Lipid panel; Future  Need for influenza vaccination -     Flu Vaccine Trivalent High Dose (Fluad)  Primary hypertension Assessment & Plan: Chronic, stable. Continue amlodipine 10 mg daily, metoprolol 25 mg daily, hydrochlorothiazide 12.5 mg daily, losartan 100 mg daily.    Other orders -     metFORMIN HCl; TAKE TABLET BY MOUTH EVERY MORNING WITH A MEAL & TWO TABLETS BY MOUTH EVERY EVENING WITH A MEAL.Marland Kitchen  Dispense: 270 tablet; Refill: 1     Return precautions given.   Risks, benefits, and alternatives of the medications and treatment plan prescribed today were discussed, and patient expressed understanding.   Education regarding symptom management and diagnosis given to patient on AVS either electronically or printed.  Return in about 6 months (around 04/17/2024).  Rennie Plowman, FNP  Subjective:    Patient ID: Larry Righter., male    DOB: 07/17/1946, 77 y.o.   MRN: [id number]  CC: Larry Eastlick. is a 77 y.o. male who presents today for follow up.   HPI: Feels well today.   No new concerns.   Following with nephrology for proteinuria, hydronephrosis  Feels well on Cymbalta and that dose is appropriate.    Allergies: Ibuprofen Current Outpatient Medications on File Prior to Visit  Medication Sig Dispense Refill   ACCU-CHEK SOFTCLIX LANCETS lancets Use up to 4 times daily to check blood sugar. Diagnosis E11.9 100 each 12   acetaminophen (TYLENOL) 500 MG tablet Take 1,500 mg by mouth 2 (two) times daily.     amLODipine (NORVASC) 10 MG tablet TAKE 1 TABLET BY MOUTH DAILY WITH LUNCH. 90 tablet 3   aspirin 81 MG chewable tablet Chew 81 mg by mouth daily. Every Tuesday and Friday     Cyanocobalamin (B-12) 5000 MCG SUBL Place 5,000 mcg under the tongue daily.      docusate sodium (COLACE) 100 MG capsule Take 100 mg by mouth daily.     DULoxetine (CYMBALTA) 30 MG capsule TAKE 1 CAPSULE BY MOUTH 2 TIMES DAILY. 180 capsule 2   glucose blood (ONETOUCH VERIO) test strip TEST 3 TIMES A DAY 100 each 0   loratadine (CLARITIN) 10 MG tablet Take 10 mg by mouth every morning.     losartan (COZAAR) 100 MG tablet TAKE 1 TABLET BY MOUTH EVERY DAY 90 tablet 3   metoprolol succinate (TOPROL-XL) 25 MG 24 hr tablet TAKE 1 TABLET (25 MG TOTAL) BY MOUTH DAILY. 90 tablet 3   pravastatin (PRAVACHOL) 40  MG tablet 36 Patient is taking m-w-f only 36 tablet 3   sildenafil (VIAGRA) 25 MG tablet Take 1 tablet (25 mg total) by mouth daily as needed for erectile dysfunction. 15 tablet 3   tamsulosin (FLOMAX) 0.4 MG CAPS capsule TAKE 2 CAPSULES BY MOUTH EVERY DAY 180 capsule 2   No current facility-administered medications on file prior to visit.    Review of Systems  Constitutional:  Negative for chills and fever.  Respiratory:  Negative for cough.   Cardiovascular:  Negative for chest pain and palpitations.  Gastrointestinal:  Negative for nausea and vomiting.      Objective:    BP 128/78   Pulse 61   Temp 97.7 F (36.5 C) (Oral)   Ht 6\' 1"  (1.854 m)   Wt 204 lb 9.6 oz (92.8  kg)   SpO2 97%   BMI 26.99 kg/m  BP Readings from Last 3 Encounters:  10/19/23 128/78  02/17/23 122/80  12/16/22 (!) 175/90   Wt Readings from Last 3 Encounters:  10/19/23 204 lb 9.6 oz (92.8 kg)  02/17/23 203 lb (92.1 kg)  12/16/22 203 lb (92.1 kg)    Physical Exam Vitals reviewed.  Constitutional:      Appearance: He is well-developed.  Cardiovascular:     Rate and Rhythm: Regular rhythm.     Heart sounds: Normal heart sounds.  Pulmonary:     Effort: Pulmonary effort is normal. No respiratory distress.     Breath sounds: Normal breath sounds. No wheezing, rhonchi or rales.  Skin:    General: Skin is warm and dry.  Neurological:     Mental Status: He is alert.  Psychiatric:        Speech: Speech normal.        Behavior: Behavior normal.

## 2023-10-20 NOTE — Assessment & Plan Note (Signed)
Chronic, stable.  Continue Cymbalta 60 mg, trazodone 50 mg qpm

## 2023-10-20 NOTE — Assessment & Plan Note (Signed)
Chronic, stable. Continue amlodipine 10 mg daily, metoprolol 25 mg daily, hydrochlorothiazide 12.5 mg daily, losartan 100 mg daily.

## 2023-10-20 NOTE — Assessment & Plan Note (Signed)
Lab Results  Component Value Date   HGBA1C 6.0 (A) 10/19/2023  Slight increase of A1c Excellent control. Continue 500mg  metformin every morning, 1000 mg every evening

## 2023-10-24 ENCOUNTER — Telehealth: Payer: Self-pay

## 2023-10-24 ENCOUNTER — Other Ambulatory Visit (HOSPITAL_COMMUNITY): Payer: Self-pay

## 2023-10-24 NOTE — Telephone Encounter (Signed)
Pharmacy Patient Advocate Encounter  Insurance verification completed.    The patient is insured through  [employer]       This test claim was processed through [facility]- copay amounts may vary at other pharmacies due to pharmacy/plan contracts, or as the patient moves through the different stages of their insurance plan.

## 2023-10-24 NOTE — Telephone Encounter (Signed)
Please sign off on rx in this encounter as PA team is unable to resolve RX requests. Thank you

## 2023-10-24 NOTE — Telephone Encounter (Signed)
LVM to call back to discuss message below and sent my chart message as well   Unfortunately the combination of azelastine and fluticasone nasal spray called Dymista is not covered by insurance.  He will have to continue to use separate nasal spray including azelastine and fluticasone.  Both of these are over-the-counter

## 2023-10-24 NOTE — Telephone Encounter (Signed)
Medication is not on pt's formulary. No PA submitted at this time.

## 2023-10-25 ENCOUNTER — Other Ambulatory Visit: Payer: Self-pay | Admitting: Family

## 2023-10-25 DIAGNOSIS — R0981 Nasal congestion: Secondary | ICD-10-CM

## 2023-10-25 DIAGNOSIS — F339 Major depressive disorder, recurrent, unspecified: Secondary | ICD-10-CM

## 2023-10-25 MED ORDER — AZELASTINE-FLUTICASONE 137-50 MCG/ACT NA SUSP: 1.0000 | Freq: Two times a day (BID) | NASAL | 8 refills | Status: DC

## 2023-11-08 ENCOUNTER — Other Ambulatory Visit: Payer: Self-pay | Admitting: Family

## 2023-11-08 DIAGNOSIS — R0981 Nasal congestion: Secondary | ICD-10-CM

## 2023-11-14 ENCOUNTER — Other Ambulatory Visit: Payer: Self-pay | Admitting: Family

## 2023-11-14 DIAGNOSIS — R0981 Nasal congestion: Secondary | ICD-10-CM

## 2023-11-23 DIAGNOSIS — D2271 Melanocytic nevi of right lower limb, including hip: Secondary | ICD-10-CM | POA: Diagnosis not present

## 2023-11-23 DIAGNOSIS — D2261 Melanocytic nevi of right upper limb, including shoulder: Secondary | ICD-10-CM | POA: Diagnosis not present

## 2023-11-23 DIAGNOSIS — D2272 Melanocytic nevi of left lower limb, including hip: Secondary | ICD-10-CM | POA: Diagnosis not present

## 2023-11-23 DIAGNOSIS — D2262 Melanocytic nevi of left upper limb, including shoulder: Secondary | ICD-10-CM | POA: Diagnosis not present

## 2023-11-23 DIAGNOSIS — L821 Other seborrheic keratosis: Secondary | ICD-10-CM | POA: Diagnosis not present

## 2023-11-23 DIAGNOSIS — L728 Other follicular cysts of the skin and subcutaneous tissue: Secondary | ICD-10-CM | POA: Diagnosis not present

## 2023-11-23 DIAGNOSIS — D225 Melanocytic nevi of trunk: Secondary | ICD-10-CM | POA: Diagnosis not present

## 2023-11-23 DIAGNOSIS — D224 Melanocytic nevi of scalp and neck: Secondary | ICD-10-CM | POA: Diagnosis not present

## 2023-12-09 ENCOUNTER — Other Ambulatory Visit: Payer: Self-pay | Admitting: Family

## 2023-12-09 DIAGNOSIS — E785 Hyperlipidemia, unspecified: Secondary | ICD-10-CM

## 2023-12-15 ENCOUNTER — Encounter: Payer: Self-pay | Admitting: Family

## 2023-12-15 DIAGNOSIS — R0981 Nasal congestion: Secondary | ICD-10-CM

## 2023-12-17 ENCOUNTER — Other Ambulatory Visit: Payer: Self-pay | Admitting: Family

## 2023-12-17 DIAGNOSIS — R0981 Nasal congestion: Secondary | ICD-10-CM

## 2023-12-18 ENCOUNTER — Ambulatory Visit: Payer: PPO | Admitting: Urology

## 2023-12-18 MED ORDER — AZELASTINE-FLUTICASONE 137-50 MCG/ACT NA SUSP: 1.0000 | Freq: Two times a day (BID) | NASAL | 8 refills | Status: DC

## 2023-12-25 ENCOUNTER — Ambulatory Visit
Admission: RE | Admit: 2023-12-25 | Discharge: 2023-12-25 | Disposition: A | Discharge status: Home / Self Care | Payer: PPO | Source: Ambulatory Visit | Attending: Emergency Medicine | Admitting: Emergency Medicine

## 2023-12-25 ENCOUNTER — Ambulatory Visit: Payer: Self-pay | Admitting: Family

## 2023-12-25 VITALS — BP 121/72 | HR 98 | Temp 97.7°F | Resp 18

## 2023-12-25 DIAGNOSIS — R300 Dysuria: Secondary | ICD-10-CM | POA: Insufficient documentation

## 2023-12-25 DIAGNOSIS — N390 Urinary tract infection, site not specified: Secondary | ICD-10-CM | POA: Diagnosis not present

## 2023-12-25 LAB — POCT URINALYSIS DIP (MANUAL ENTRY)
Bilirubin, UA: NEGATIVE
Glucose, UA: NEGATIVE mg/dL
Ketones, POC UA: NEGATIVE mg/dL
Nitrite, UA: POSITIVE — AB
Protein Ur, POC: 100 mg/dL — AB
Spec Grav, UA: 1.015 (ref 1.010–1.025)
Urobilinogen, UA: 0.2 U/dL
pH, UA: 6 (ref 5.0–8.0)

## 2023-12-25 MED ORDER — CEPHALEXIN 500 MG PO CAPS: 500.0000 mg | ORAL_CAPSULE | Freq: Two times a day (BID) | ORAL | 0 refills | Status: DC

## 2023-12-25 NOTE — ED Provider Notes (Signed)
Renaldo Fiddler    CSN: [id number] Arrival date & time: 12/25/23  1637      History   Chief Complaint Chief Complaint  Patient presents with   Urinary Frequency    Entered by patient    HPI Larry Roman. is a 78 y.o. male.  Accompanied by his wife, patient presents with dysuria x 4 days.  He had decreased urine output but this has improved today.  No fever, abdominal pain, flank pain, hematuria.  No OTC medications taken.  His medical history includes BPH.  The history is provided by the patient and medical records.    Past Medical History:  Diagnosis Date   Arthritis    Depression    DM (diabetes mellitus) (HCC)    Extrinsic asthma, unspecified    childhood   GERD (gastroesophageal reflux disease)    rare   History of kidney stones    Hives of unknown origin    HTN (hypertension)    Other allergy, other than to medicinal agents    RBBB    Sleep apnea    25 years ago mild lost weight no longer uses cpap   Wound of right leg 08/2018   area just below knee size of quarter, reddened around edges    Patient Active Problem List   Diagnosis Date Noted   Bronchitis 08/26/2022   Headache 06/10/2022   Proteinuria 09/13/2021   Nasal congestion 12/21/2020   Encounter for long-term (current) use of aspirin 09/21/2020   HLD (hyperlipidemia) 09/21/2020   S/P TKR (total knee replacement) using cement, 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   History of gout 08/07/2019   Other fatigue 12/17/2018   Primary osteoarthritis of right knee 10/13/2018   S/P TKR (total knee replacement) using cement, 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/2018   OSA (obstructive sleep apnea) 04/16/2018   Tremor 01/17/2018   Acute left-sided low back pain without sciatica 11/10/2017   BPH (benign prostatic hyperplasia) 01/10/2017   Chronic back pain 08/19/2016   Chronic pain of both knees 09/14/2015   Erectile dysfunction 09/10/2012   Right bundle branch block 09/10/2012   Hypertension 11/10/2011   Insomnia 11/10/2011   Diabetes  mellitus type 2, controlled (HCC) 11/10/2011   Depression, recurrent (HCC) 05/13/2009    Past Surgical History:  Procedure Laterality Date   BACK SURGERY  1995   ruptured disc encapsulated nerves   CATARACT EXTRACTION, BILATERAL  2016   Dr. Bryon Lions at [facility]   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   Procedure: TOTAL KNEE ARTHROPLASTY;  Surgeon: Juanell Fairly, MD;  Location: [facility] OR[facility];  Service: Orthopedics;  Laterality: Right;   TOTAL KNEE ARTHROPLASTY Left 08/15/2019   Procedure: TOTAL KNEE ARTHROPLASTY;  Surgeon: Juanell Fairly, MD;  Location: [facility];  Service: Orthopedics;  Laterality: Left;       Home Medications    Prior to Admission medications   Medication Sig Start Date End Date Taking? Authorizing Provider  cephALEXin (KEFLEX) 500 MG capsule Take 1 capsule (500 mg total) by mouth 2 (two) times daily for 5 days. 12/25/23 12/30/23 Yes Mickie Bail, NP  ACCU-CHEK SOFTCLIX LANCETS lancets Use up to 4 times daily to  check blood sugar. Diagnosis E11.9 10/22/16   Tommie Sams, DO  acetaminophen (TYLENOL) 500 MG tablet Take 1,500 mg by mouth 2 (two) times daily.    [provider]  albuterol (VENTOLIN HFA) 108 (90 Base) MCG/ACT inhaler TAKE 2 PUFFS BY MOUTH EVERY 6 HOURS AS NEEDED FOR WHEEZE OR SHORTNESS OF BREATH 11/14/23   Allegra Grana, FNP  amLODipine (NORVASC) 10 MG tablet TAKE 1 TABLET BY MOUTH DAILY WITH LUNCH. 10/19/22   Allegra Grana, FNP  aspirin 81 MG chewable tablet Chew 81 mg by mouth daily. Every Tuesday and Friday    [provider]  Azelastine-Fluticasone 137-50  MCG/ACT SUSP Place 1 spray into the nose every 12 (twelve) hours. 12/18/23   Allegra Grana, FNP  Cyanocobalamin (B-12) 5000 MCG SUBL Place 5,000 mcg under the tongue daily.     [provider]  docusate sodium (COLACE) 100 MG capsule Take 100 mg by mouth daily.    [provider]  DULoxetine (CYMBALTA) 30 MG capsule TAKE 1 CAPSULE BY MOUTH 2 TIMES DAILY. 07/31/23   Allegra Grana, FNP  glucose blood (ONETOUCH VERIO) test strip TEST 3 TIMES A DAY 10/15/18   Sharee Holster, NP  loratadine (CLARITIN) 10 MG tablet Take 10 mg by mouth every morning.    [provider]  losartan (COZAAR) 100 MG tablet TAKE 1 TABLET BY MOUTH EVERY DAY 07/31/23   Allegra Grana, FNP  metFORMIN (GLUCOPHAGE) 500 MG tablet TAKE TABLET BY MOUTH EVERY MORNING WITH A MEAL & TWO TABLETS BY MOUTH EVERY EVENING WITH A MEAL.. 10/19/23   Allegra Grana, FNP  metoprolol succinate (TOPROL-XL) 25 MG 24 hr tablet TAKE 1 TABLET (25 MG TOTAL) BY MOUTH DAILY. 07/10/23   Allegra Grana, FNP  pravastatin (PRAVACHOL) 40 MG tablet 36 PATIENT IS TAKING M-W-F ONLY 12/11/23   Allegra Grana, FNP  sildenafil (VIAGRA) 25 MG tablet Take 1 tablet (25 mg total) by mouth daily as needed for erectile dysfunction. 02/17/23   Allegra Grana, FNP  tamsulosin (FLOMAX) 0.4 MG CAPS capsule TAKE 2 CAPSULES BY MOUTH EVERY DAY 05/17/23   Stoioff, Verna Czech, MD  traZODone (DESYREL) 100 MG tablet Take 1 tablet (100 mg total) by mouth at bedtime. for sleep 10/19/23   Allegra Grana, FNP  traZODone (DESYREL) 50 MG tablet TAKE 1 TABLET BY MOUTH AT BEDTIME AS NEEDED FOR SLEEP 10/25/23   Allegra Grana, FNP    Family History Family History  Problem Relation Age of Onset   Heart failure Father    Heart attack Mother     Social History Social History   Tobacco Use   Smoking status: Never   Smokeless tobacco: Never   Tobacco comments:    tobacco use - no  Vaping Use   Vaping status: Never Used  Substance  Use Topics   Alcohol use: Yes    Comment: 6   Drug use: No     Allergies   Ibuprofen   Review of Systems Review of Systems  Constitutional:  Negative for chills and fever.  Gastrointestinal:  Negative for abdominal pain.  Genitourinary:  Positive for dysuria. Negative for flank pain and hematuria.     Physical Exam Triage Vital Signs ED Triage Vitals  Encounter Vitals Group     BP      Systolic BP Percentile      Diastolic BP Percentile      Pulse      Resp  Temp      Temp src      SpO2      Weight      Height      Head Circumference      Peak Flow      Pain Score      Pain Loc      Pain Education      Exclude from Growth Chart    No data found.  Updated Vital Signs BP 121/72   Pulse 98   Temp 97.7 F (36.5 C)   Resp 18   SpO2 95%   Visual Acuity Right Eye Distance:   Left Eye Distance:   Bilateral Distance:    Right Eye Near:   Left Eye Near:    Bilateral Near:     Physical Exam Constitutional:      General: He is not in acute distress. Cardiovascular:     Rate and Rhythm: Normal rate and regular rhythm.  Pulmonary:     Effort: Pulmonary effort is normal. No respiratory distress.  Abdominal:     General: Bowel sounds are normal.     Palpations: Abdomen is soft.     Tenderness: There is no abdominal tenderness. There is no right CVA tenderness, left CVA tenderness, guarding or rebound.  Neurological:     Mental Status: He is alert.      UC Treatments / Results  Labs (all labs ordered are listed, but only abnormal results are displayed) Labs Reviewed  POCT URINALYSIS DIP (MANUAL ENTRY) - Abnormal; Notable for the following components:      Result Value   Clarity, UA cloudy (*)    Blood, UA moderate (*)    Protein Ur, POC =100 (*)    Nitrite, UA Positive (*)    Leukocytes, UA Small (1+) (*)    All other components within normal limits  URINE CULTURE    EKG   Radiology No results found.  Procedures Procedures  (including critical care time)  Medications Ordered in UC Medications - No data to display  Initial Impression / Assessment and Plan / UC Course  I have reviewed the triage vital signs and the nursing notes.  Pertinent labs & imaging results that were available during my care of the patient were reviewed by me and considered in my medical decision making (see chart for details).   UTI, dysuria.  Afebrile and vital signs are stable.  Abdomen is soft and nontender.  No CVAT.  Treating with Keflex. Urine culture pending. Discussed with patient that we will call him if the urine culture shows the need to change or discontinue the antibiotic. Instructed him to follow-up with his PCP tomorrow.  ED precautions discussed. Patient agrees to plan of care.      Final Clinical Impressions(s) / UC Diagnoses   Final diagnoses:  Dysuria  Urinary tract infection without hematuria, site unspecified     Discharge Instructions      Take the antibiotic as directed.  The urine culture is pending.  We will call you if it shows the need to change or discontinue your antibiotic.    Follow up with your primary care provider tomorrow.  Go to the emergency department if you have worsening symptoms.        ED Prescriptions     Medication Sig Dispense Auth. Provider   cephALEXin (KEFLEX) 500 MG capsule Take 1 capsule (500 mg total) by mouth 2 (two) times daily for 5 days. 10 capsule Mickie Bail,  NP      PDMP not reviewed this encounter.   Mickie Bail, NP 12/25/23 [phone]

## 2023-12-25 NOTE — ED Triage Notes (Signed)
Provider triage  

## 2023-12-25 NOTE — Telephone Encounter (Signed)
Copied from CRM [id number]. Topic: Appointments - Appointment Scheduling >> Dec 25, 2023  8:08 AM [place] A wrote: Patient's wife called patient is having problem passing urine, has chills,diarrhea this morning scale 1-10 pain 8 or 9  Chief Complaint: pain with urination Symptoms: painful urination and states unable to fully empty bladder Frequency: constant Pertinent Negatives: Patient denies fever Disposition: [] ED /[] Urgent Care (no appt availability in office) / [] Appointment(In office/virtual)/ []  [place] [facility]/ [] Home Care/ [] Refused Recommended Disposition /[] [place] [facility]/ []  Follow-up with PCP Additional Notes: Had RSV vaccine on Friday. States has chills and body aches but denies fever.  States he is only urinating drops and sometimes spurts.  States pain is 8-9/10.  Instructed to go to the ER.  Pcp office updated.   Reason for Disposition  [1] Unable to urinate (or only a few drops) > 4 hours AND [2] bladder feels very full (e.g., feels blocked with strong urge to urinate; palpable bladder)  Answer Assessment - Initial Assessment Questions 1. SEVERITY: "How bad is the pain?"  (e.g., Scale 1-10; mild, moderate, or severe)   - MILD (1-3): Complains slightly about urination hurting.   - MODERATE (4-7): Interferes with normal activities.     - SEVERE (8-10): Excruciating, unwilling or unable to urinate because of the pain.      Pain with urination, 8-9/10 2. FREQUENCY: "How many times have you had painful urination today?"      7-8 times and feels like he can't empty bladder 3. PATTERN: "Is pain present every time you urinate or just sometimes?"      everytime 4. ONSET: "When did the painful urination start?"      Last friday 5. FEVER: "Do you have a fever?" If Yes, ask: "What is your temperature, how was it measured, and when did it start?"     denies 6. PAST UTI: "Have you had a urine infection before?" If Yes, ask: "When was the last time?" and "What happened  that time?"      yes 7. CAUSE: "What do you think is causing the painful urination?"      uti 8. OTHER SYMPTOMS: "Do you have any other symptoms?" (e.g., flank pain, penis discharge, scrotal pain, blood in urine)     Denies.  Protocols used: Urination Pain - Male-A-AH

## 2023-12-25 NOTE — Discharge Instructions (Addendum)
Take the antibiotic as directed.  The urine culture is pending.  We will call you if it shows the need to change or discontinue your antibiotic.    Follow up with your primary care provider tomorrow.  Go to the emergency department if you have worsening symptoms.

## 2023-12-25 NOTE — Telephone Encounter (Signed)
 Pt is at [facility]

## 2023-12-25 NOTE — Telephone Encounter (Signed)
FYI-Patient states he has an appointment at Urgent Care at 5:00 on 12/25/23.

## 2023-12-27 LAB — URINE CULTURE: Culture: >=100000 — AB

## 2023-12-28 ENCOUNTER — Encounter: Payer: Self-pay | Admitting: Family

## 2023-12-29 ENCOUNTER — Other Ambulatory Visit: Payer: Self-pay | Admitting: Family

## 2023-12-29 DIAGNOSIS — N3001 Acute cystitis with hematuria: Secondary | ICD-10-CM

## 2023-12-29 MED ORDER — CEPHALEXIN 500 MG PO CAPS: 500.0000 mg | ORAL_CAPSULE | Freq: Two times a day (BID) | ORAL | 0 refills | Status: AC

## 2024-01-01 ENCOUNTER — Ambulatory Visit: Payer: PPO | Admitting: Family

## 2024-01-06 ENCOUNTER — Other Ambulatory Visit: Payer: Self-pay | Admitting: Family

## 2024-01-06 DIAGNOSIS — I10 Essential (primary) hypertension: Secondary | ICD-10-CM

## 2024-01-08 ENCOUNTER — Ambulatory Visit: Payer: Self-pay | Admitting: Family

## 2024-01-08 NOTE — Telephone Encounter (Signed)
FYI pt is scheduled to see you on Wed, I offered him to see another provider he prefers to see Larry Roman

## 2024-01-08 NOTE — Telephone Encounter (Signed)
Copied from CRM [id number]. Topic: Clinical - Red Word Triage >> Jan 08, 2024 10:52 AM Gurney Maxin H wrote: [facility] that prompted transfer to Nurse Triage: Chills, no appetite cough fatigue, body aches, diarrhea. Patient states he received the rsv vaccination on the 14th and symptoms started on the 16th.   Chief Complaint: Flu like symptoms  Symptoms: Cough, chill, fatigue, body aches, diarrhea  Frequency: Constant  Pertinent Negatives: Patient denies shortness of breath Disposition: [] ED /[] Urgent Care (no appt availability in office) / [x] Appointment(In office/virtual)/ []  [place] [facility]/ [] Home Care/ [] Refused Recommended Disposition /[] [facility] [facility]/ []  Follow-up with PCP Additional Notes: Patient reports that he had the RSV vaccine on 12/19/23 and on 12/21/23 he began to feel ill. He reports experiencing chills, body aches, fatigue, cough, and diarrhea. He denies any known sick contacts and states he had the flu shot this year. Appointment made for the patient. Patient advised to call back for any new or worsening symptoms. Patient verbalized understanding and is agreeable with this plan.     Reason for Disposition  [1] Nasal discharge AND [2] present > 10 days  Answer Assessment - Initial Assessment Questions 1. WORST SYMPTOM: "What is your worst symptom?" (e.g., cough, runny nose, muscle aches, headache, sore throat, fever)      Fatigue  2. ONSET: "When did your flu symptoms start?"      12/21/23 3. COUGH: "How bad is the cough?"       Dry cough 4. RESPIRATORY DISTRESS: "Describe your breathing."      No 5. FEVER: "Do you have a fever?" If Yes, ask: "What is your temperature, how was it measured, and when did it start?"     No recorded fever  6. EXPOSURE: "Were you exposed to someone with influenza?"       No 7. FLU VACCINE: "Did you get a flu shot this year?"     Yes 8. HIGH RISK DISEASE: "Do you have any chronic medical problems?" (e.g., heart or lung disease,  asthma, weak immune system, or other HIGH RISK conditions)     No 10. OTHER SYMPTOMS: "Do you have any other symptoms?"  (e.g., runny nose, muscle aches, headache, sore throat)       Body aches, chills, diarrhea, loss of appetite  Protocols used: Influenza - [facility]

## 2024-01-09 ENCOUNTER — Telehealth: Payer: Self-pay

## 2024-01-09 ENCOUNTER — Ambulatory Visit: Payer: PPO

## 2024-01-09 NOTE — Telephone Encounter (Signed)
Copied from CRM [id number]. Topic: General - Other >> Jan 09, 2024 12:22 PM Elizebeth Brooking wrote: Reason for CRM: Patient returning a missed call from office, informed him if he was okay with moving his appointment to 9am he stated yes that was fine

## 2024-01-09 NOTE — Telephone Encounter (Signed)
LVM and sent pt my chart message to see if he can come in tomorrow 01/10/24 @ 9 am instead of 12:45 pm

## 2024-01-09 NOTE — Telephone Encounter (Signed)
 Noted

## 2024-01-10 ENCOUNTER — Ambulatory Visit (INDEPENDENT_AMBULATORY_CARE_PROVIDER_SITE_OTHER): Payer: PPO

## 2024-01-10 ENCOUNTER — Encounter: Payer: Self-pay | Admitting: Family

## 2024-01-10 ENCOUNTER — Other Ambulatory Visit: Payer: PPO

## 2024-01-10 ENCOUNTER — Ambulatory Visit: Payer: PPO | Admitting: Family

## 2024-01-10 ENCOUNTER — Ambulatory Visit: Payer: PPO

## 2024-01-10 VITALS — BP 150/80 | HR 79 | Temp 97.8°F | Ht 73.0 in | Wt 203.4 lb

## 2024-01-10 DIAGNOSIS — G4700 Insomnia, unspecified: Secondary | ICD-10-CM

## 2024-01-10 DIAGNOSIS — R059 Cough, unspecified: Secondary | ICD-10-CM | POA: Diagnosis not present

## 2024-01-10 DIAGNOSIS — R6889 Other general symptoms and signs: Secondary | ICD-10-CM | POA: Diagnosis not present

## 2024-01-10 DIAGNOSIS — R197 Diarrhea, unspecified: Secondary | ICD-10-CM

## 2024-01-10 DIAGNOSIS — R051 Acute cough: Secondary | ICD-10-CM

## 2024-01-10 DIAGNOSIS — R6883 Chills (without fever): Secondary | ICD-10-CM | POA: Diagnosis not present

## 2024-01-10 DIAGNOSIS — N410 Acute prostatitis: Secondary | ICD-10-CM | POA: Diagnosis not present

## 2024-01-10 DIAGNOSIS — J40 Bronchitis, not specified as acute or chronic: Secondary | ICD-10-CM | POA: Diagnosis not present

## 2024-01-10 LAB — TSH: TSH: 1.07 u[IU]/mL (ref 0.35–5.50)

## 2024-01-10 LAB — COMPREHENSIVE METABOLIC PANEL
ALT: 13 U/L (ref 0–53)
AST: 15 U/L (ref 0–37)
Albumin: 3.7 g/dL (ref 3.5–5.2)
Alkaline Phosphatase: 69 U/L (ref 39–117)
BUN: 9 mg/dL (ref 6–23)
CO2: 30 meq/L (ref 19–32)
Calcium: 9.8 mg/dL (ref 8.4–10.5)
Chloride: 99 meq/L (ref 96–112)
Creatinine, Ser: 0.73 mg/dL (ref 0.40–1.50)
GFR: 88.37 mL/min (ref >60.00)
Glucose, Bld: 124 mg/dL — ABNORMAL HIGH (ref 70–99)
Potassium: 3.4 meq/L — ABNORMAL LOW (ref 3.5–5.1)
Sodium: 140 meq/L (ref 135–145)
Total Bilirubin: 0.6 mg/dL (ref 0.2–1.2)
Total Protein: 7.3 g/dL (ref 6.0–8.3)

## 2024-01-10 LAB — CBC WITH DIFFERENTIAL/PLATELET
Basophils Absolute: 0.1 10*3/uL (ref 0.0–0.1)
Basophils Relative: 1.2 % (ref 0.0–3.0)
Eosinophils Absolute: 0.2 10*3/uL (ref 0.0–0.7)
Eosinophils Relative: 1.6 % (ref 0.0–5.0)
HCT: 34.9 % — ABNORMAL LOW (ref 39.0–52.0)
Hemoglobin: 11.6 g/dL — ABNORMAL LOW (ref 13.0–17.0)
Lymphocytes Relative: 12.9 % (ref 12.0–46.0)
Lymphs Abs: 1.4 10*3/uL (ref 0.7–4.0)
MCHC: 33.3 g/dL (ref 30.0–36.0)
MCV: 94.5 fL (ref 78.0–100.0)
Monocytes Absolute: 0.8 10*3/uL (ref 0.1–1.0)
Monocytes Relative: 7.6 % (ref 3.0–12.0)
Neutro Abs: 8.4 10*3/uL — ABNORMAL HIGH (ref 1.4–7.7)
Neutrophils Relative %: 76.7 % (ref 43.0–77.0)
Platelets: 582 10*3/uL — ABNORMAL HIGH (ref 150.0–400.0)
RBC: 3.69 Mil/uL — ABNORMAL LOW (ref 4.22–5.81)
RDW: 13.5 % (ref 11.5–15.5)
WBC: 11 10*3/uL — ABNORMAL HIGH (ref 4.0–10.5)

## 2024-01-10 LAB — URINALYSIS, ROUTINE W REFLEX MICROSCOPIC
Bilirubin Urine: NEGATIVE
Ketones, ur: NEGATIVE
Nitrite: POSITIVE — AB
Specific Gravity, Urine: 1.015 (ref 1.000–1.030)
Total Protein, Urine: 100 — AB
Urine Glucose: NEGATIVE
Urobilinogen, UA: 0.2 (ref 0.0–1.0)
pH: 6 (ref 5.0–8.0)

## 2024-01-10 MED ORDER — AZELASTINE HCL 0.1 % NA SOLN: 1.0000 | Freq: Two times a day (BID) | NASAL | 4 refills | Status: AC

## 2024-01-10 MED ORDER — BENZONATATE 100 MG PO CAPS: 100.0000 mg | ORAL_CAPSULE | Freq: Three times a day (TID) | ORAL | 1 refills | Status: DC | PRN

## 2024-01-10 NOTE — Patient Instructions (Addendum)
  Please hold Imodium until stool studies return  Take 0.5 to 5mg  melatonin at 7pm with dinner -this is when natural melatonin will start to increase You may also trial melatonin combination over the counter supplement such as Sleep#3 or Qunol Sleep 5 in 1  We will stay in close touch as results come in.  Please let me know if any new concerns

## 2024-01-10 NOTE — Progress Notes (Signed)
 Assessment & Plan:  Bronchitis -     Azelastine  HCl; Place 1 spray into both nostrils 2 (two) times daily. Use in each nostril as directed  Dispense: 30 mL; Refill: 4 -     Benzonatate ; Take 1 capsule (100 mg total) by mouth 3 (three) times daily as needed for cough.  Dispense: 20 capsule; Refill: 1 -     Urinalysis, Routine w reflex microscopic -     Comprehensive metabolic panel -     TSH -     DG Chest 2 View; Future -     Urine Culture  Diarrhea, unspecified type -     CBC with Differential/Platelet -     Comprehensive metabolic panel -     TSH -     GI pathogen panel by PCR, stool; Future -     C Difficile Quick Screen w PCR reflex; Future  Acute cough -     Respiratory virus panel  Chills (without fever) Assessment & Plan: Afebrile today.  Nontoxic in appearance.  Rigors have resolved.  Reassuring HEENT , GU exam. discussed concern for infection including URI symptoms, recent UTI, and loose to formed stools over the last 5 days.  Discussed whether diarrhea related to 10-day course of Keflex .  Encouraged probiotics.  He will return stool test.  Advised to hold imodium until results have returned. Pending respiratory virus panel, labs, CXR, urine studies.    Insomnia, unspecified type Assessment & Plan: Suboptimal control.  Discussed continue trazodone  150 mg nightly.  Advised to trial melatonin or melatonin combination OTC.  Close follow-up      Return precautions given.   Risks, benefits, and alternatives of the medications and treatment plan prescribed today were discussed, and patient expressed understanding.   Education regarding symptom management and diagnosis given to patient on AVS either electronically or printed.  No follow-ups on file.  Rollene Northern, FNP  Subjective:    Patient ID: Larry JINNY Dewey Mickey., male    DOB: 15-Oct-1946, 78 y.o.   MRN: [id number]  CC: Larry Haden. is a 78 y.o. male who presents today for an acute visit.    HPI: Accompanied  by wife Complains of bodyaches, fatigue chills 2 days after vaccine 3 weeks ago. Describes rigors, chills resolved over the past 3 days  Symptom onset after RSV vaccine 12/19/23  Endorses  fatigue cough, nasal congestion with green , scant BRB in sputum, loose non bloody stool  Loose to watery brown , non bloody diarrhea x 5 days ago. 6 episodes per day. Diarrhea 3 episodes the past couple of days and describes as sporadic. Not related to eating. He has taken imodium prn. He has been eating yogurt.   Denies fever, SOB, CP, HA, dizziness, nausea, hematuria, flank pain, nausea  He has been using his albuterol  inhaler, tessalon  perles, azelastine .    Fatigue is somewhat  improved. He is not sleeping as well at night.    Covid test negative 3 days ago.       Seen 12/25/23 at urgent care  Cephalexin  500mg  BID x 5 days 12/27/22  for UTI with hematuria, ecoli urine culture He has completed second course of cephalexin  that he requested via mychart ; I prescribed cephalexin  500mg  BID x 5 days 12/29/23.   History of OSA, hypertension, diabetes, asthma H/o left renal stone; seen CT renal 07/2021; s/p stent; last seen by [facility] 12/2022   He is not wearing cipap since 50 lb weight loss.  Allergies: Ibuprofen Current Outpatient Medications on File Prior to Visit  Medication Sig Dispense Refill   ACCU-CHEK SOFTCLIX LANCETS lancets Use up to 4 times daily to check blood sugar. Diagnosis E11.9 100 each 12   acetaminophen  (TYLENOL ) 500 MG tablet Take 1,500 mg by mouth 2 (two) times daily.     albuterol  (VENTOLIN  HFA) 108 (90 Base) MCG/ACT inhaler TAKE 2 PUFFS BY MOUTH EVERY 6 HOURS AS NEEDED FOR WHEEZE OR SHORTNESS OF BREATH 1 each 2   amLODipine  (NORVASC ) 10 MG tablet TAKE 1 TABLET BY MOUTH DAILY WITH LUNCH. 90 tablet 3   aspirin  81 MG chewable tablet Chew 81 mg by mouth daily. Every Tuesday and Friday     Cyanocobalamin  (B-12) 5000 MCG SUBL Place 5,000 mcg under the tongue daily.      docusate  sodium (COLACE) 100 MG capsule Take 100 mg by mouth daily.     DULoxetine  (CYMBALTA ) 30 MG capsule TAKE 1 CAPSULE BY MOUTH 2 TIMES DAILY. 180 capsule 2   glucose blood (ONETOUCH VERIO) test strip TEST 3 TIMES A DAY 100 each 0   loratadine  (CLARITIN ) 10 MG tablet Take 10 mg by mouth every morning.     losartan  (COZAAR ) 100 MG tablet TAKE 1 TABLET BY MOUTH EVERY DAY 90 tablet 3   metFORMIN  (GLUCOPHAGE ) 500 MG tablet TAKE TABLET BY MOUTH EVERY MORNING WITH A MEAL & TWO TABLETS BY MOUTH EVERY EVENING WITH A MEAL.. 270 tablet 1   metoprolol  succinate (TOPROL -XL) 25 MG 24 hr tablet TAKE 1 TABLET (25 MG TOTAL) BY MOUTH DAILY. 90 tablet 3   pravastatin  (PRAVACHOL ) 40 MG tablet 36 PATIENT IS TAKING M-W-F ONLY 108 tablet 1   sildenafil  (VIAGRA ) 25 MG tablet Take 1 tablet (25 mg total) by mouth daily as needed for erectile dysfunction. 15 tablet 3   tamsulosin  (FLOMAX ) 0.4 MG CAPS capsule TAKE 2 CAPSULES BY MOUTH EVERY DAY 180 capsule 2   traZODone  (DESYREL ) 100 MG tablet Take 1 tablet (100 mg total) by mouth at bedtime. for sleep 90 tablet 3   traZODone  (DESYREL ) 50 MG tablet TAKE 1 TABLET BY MOUTH AT BEDTIME AS NEEDED FOR SLEEP 90 tablet 1   No current facility-administered medications on file prior to visit.    Review of Systems  Constitutional:  Positive for fatigue. Negative for chills (resolved) and fever.  Respiratory:  Negative for cough.   Cardiovascular:  Negative for chest pain and palpitations.  Gastrointestinal:  Positive for diarrhea. Negative for abdominal pain, constipation, nausea and vomiting.  Genitourinary:  Negative for dysuria.      Objective:    BP (!) 150/80   Pulse 79   Temp 97.8 F (36.6 C) (Oral)   Ht 6' 1 (1.854 m)   Wt 203 lb 6.4 oz (92.3 kg)   SpO2 97%   BMI 26.84 kg/m   BP Readings from Last 3 Encounters:  01/10/24 (!) 150/80  12/25/23 121/72  10/19/23 128/78   Wt Readings from Last 3 Encounters:  01/10/24 203 lb 6.4 oz (92.3 kg)  10/19/23 204 lb 9.6 oz  (92.8 kg)  02/17/23 203 lb (92.1 kg)    Physical Exam Vitals reviewed.  Constitutional:      Appearance: He is well-developed.  Cardiovascular:     Rate and Rhythm: Regular rhythm.     Heart sounds: Normal heart sounds.  Pulmonary:     Effort: Pulmonary effort is normal. No respiratory distress.     Breath sounds: Normal breath sounds. No wheezing, rhonchi  or rales.  Abdominal:     General: Bowel sounds are normal.     Tenderness: There is no right CVA tenderness, left CVA tenderness, guarding or rebound.     Comments: No suprapubic tenderness. Abdomen is soft.   Skin:    General: Skin is warm and dry.  Neurological:     Mental Status: He is alert.  Psychiatric:        Speech: Speech normal.        Behavior: Behavior normal.

## 2024-01-11 ENCOUNTER — Encounter: Payer: Self-pay | Admitting: Family

## 2024-01-11 ENCOUNTER — Ambulatory Visit: Payer: PPO | Admitting: Family

## 2024-01-11 ENCOUNTER — Telehealth: Payer: Self-pay | Admitting: Family

## 2024-01-11 VITALS — BP 124/60 | HR 76 | Temp 98.3°F | Ht 73.0 in | Wt 206.0 lb

## 2024-01-11 DIAGNOSIS — R6883 Chills (without fever): Secondary | ICD-10-CM | POA: Diagnosis not present

## 2024-01-11 DIAGNOSIS — N390 Urinary tract infection, site not specified: Secondary | ICD-10-CM | POA: Diagnosis not present

## 2024-01-11 DIAGNOSIS — N10 Acute tubulo-interstitial nephritis: Secondary | ICD-10-CM

## 2024-01-11 DIAGNOSIS — N419 Inflammatory disease of prostate, unspecified: Secondary | ICD-10-CM | POA: Insufficient documentation

## 2024-01-11 MED ORDER — CEFTRIAXONE SODIUM 1 G IJ SOLR
1.0000 g | Freq: Once | INTRAMUSCULAR | Status: AC
  Administered 2024-01-11: 1 g via INTRAMUSCULAR

## 2024-01-11 MED ORDER — SULFAMETHOXAZOLE-TRIMETHOPRIM 800-160 MG PO TABS: 1.0000 | ORAL_TABLET | Freq: Two times a day (BID) | ORAL | 0 refills | Status: DC

## 2024-01-11 NOTE — Assessment & Plan Note (Addendum)
 Afebrile.  Nontoxic in appearance .no recurrence of chills nor diarrhea.  Advised to return stool specimen if diarrhea recurs.  Ceftriaxone  1 g IM given in the office today.  Patient will start Bactrim  twice daily x 10 days.  Follow-up next week with repeat CBC. Consider urology consult.

## 2024-01-11 NOTE — Telephone Encounter (Signed)
 Spoke to pt went over message below in detail pt verbalized understanding pt stated that he will come in between 1 pm and 1:30 pm

## 2024-01-11 NOTE — Progress Notes (Signed)
 Provider ordered 1 g of rocephin . The Rocephin  was reconstituted with 2.1 mL of 1% Lidocaine ( Lot: [id number] Exp: 09-03-25).  Pt tolerated the injection well in the right anterior thigh.

## 2024-01-11 NOTE — Assessment & Plan Note (Addendum)
 Afebrile today.  Nontoxic in appearance.  Rigors have resolved.  Reassuring HEENT , GU exam. discussed concern for infection including URI symptoms, recent UTI, and loose to formed stools over the last 5 days.  Discussed whether diarrhea related to 10-day course of Keflex .  Encouraged probiotics.  He will return stool test.  Advised to hold imodium until results have returned. Pending respiratory virus panel, labs, CXR, urine studies.

## 2024-01-11 NOTE — Assessment & Plan Note (Signed)
 Suboptimal control.  Discussed continue trazodone  150 mg nightly.  Advised to trial melatonin or melatonin combination OTC.  Close follow-up

## 2024-01-11 NOTE — Telephone Encounter (Signed)
 Call pt  Chest x-ray is reassuring without evidence of pneumonia.  I am concerned with his urinalysis with evidence for white blood cells, nitrites.  In his blood work, white blood cells are elevated.  Is he still having diarrhea?    Please advise him to bring the stool samples ASAP [facility].  I am hoping this is related to the Keflex  (antibiotic) recent use.   I am concerned for a more complicated urinary tract infection.  I would like him to come in today to have an antibiotic injection ceftriaxone  1 g IM  one dose. Please bring him in asap.   I would like to see him as well.   After the injection today, I have sent in a new antibiotic called Bactrim  that he will take for 10 days.  It is imperative that he continues probiotics due to risk of diarrheal infections after back-to-back antibiotics  He may reduce metformin  daily dose by taking 500mg  daily instead of 1500mg  daily while he is on this antibiotic as Bactrim  can cause metformin  to be more potent.  His A1c has been under excellent control   We will consider follow-up with urology.  Previously he saw Dr. Twylla.   Schedule follow-up with me early next week

## 2024-01-11 NOTE — Patient Instructions (Addendum)
 Start bactrim    Continue probiotics.  Please let me know how you are doing

## 2024-01-11 NOTE — Progress Notes (Signed)
 Assessment & Plan:  Urinary tract infection without hematuria, site unspecified -     cefTRIAXone  Sodium  Chills (without fever) Assessment & Plan: Afebrile.  Nontoxic in appearance .no recurrence of chills nor diarrhea.  Advised to return stool specimen if diarrhea recurs.  Ceftriaxone  1 g IM given in the office today.  Patient will start Bactrim  twice daily x 10 days.  Follow-up next week with repeat CBC. Consider urology consult.       Return precautions given.   Risks, benefits, and alternatives of the medications and treatment plan prescribed today were discussed, and patient expressed understanding.   Education regarding symptom management and diagnosis given to patient on AVS either electronically or printed.  Return in about 1 week (around 01/18/2024).  Rollene Northern, FNP  Subjective:    Patient ID: Larry JINNY Dewey Mickey., male    DOB: 02-05-1946, 78 y.o.   MRN: [id number]  CC: Larry Blossom. is a 78 y.o. male who presents today for an acute visit.    HPI: Accompanied by wife for ceftriaxone  injection    No new concerns today.  Continues to have sporadic cough.  Denies fever, chills Normal formed BM today  Elevated WBCs , platelets,  hbg 11.6   Allergies: Ibuprofen Current Outpatient Medications on File Prior to Visit  Medication Sig Dispense Refill   ACCU-CHEK SOFTCLIX LANCETS lancets Use up to 4 times daily to check blood sugar. Diagnosis E11.9 100 each 12   acetaminophen  (TYLENOL ) 500 MG tablet Take 1,500 mg by mouth 2 (two) times daily.     albuterol  (VENTOLIN  HFA) 108 (90 Base) MCG/ACT inhaler TAKE 2 PUFFS BY MOUTH EVERY 6 HOURS AS NEEDED FOR WHEEZE OR SHORTNESS OF BREATH 1 each 2   amLODipine  (NORVASC ) 10 MG tablet TAKE 1 TABLET BY MOUTH DAILY WITH LUNCH. 90 tablet 3   aspirin  81 MG chewable tablet Chew 81 mg by mouth daily. Every Tuesday and Friday     azelastine  (ASTELIN ) 0.1 % nasal spray Place 1 spray into both nostrils 2 (two) times daily. Use in each  nostril as directed 30 mL 4   benzonatate  (TESSALON ) 100 MG capsule Take 1 capsule (100 mg total) by mouth 3 (three) times daily as needed for cough. 20 capsule 1   Cyanocobalamin  (B-12) 5000 MCG SUBL Place 5,000 mcg under the tongue daily.      docusate sodium  (COLACE) 100 MG capsule Take 100 mg by mouth daily.     DULoxetine  (CYMBALTA ) 30 MG capsule TAKE 1 CAPSULE BY MOUTH 2 TIMES DAILY. 180 capsule 2   glucose blood (ONETOUCH VERIO) test strip TEST 3 TIMES A DAY 100 each 0   loratadine  (CLARITIN ) 10 MG tablet Take 10 mg by mouth every morning.     losartan  (COZAAR ) 100 MG tablet TAKE 1 TABLET BY MOUTH EVERY DAY 90 tablet 3   metFORMIN  (GLUCOPHAGE ) 500 MG tablet TAKE TABLET BY MOUTH EVERY MORNING WITH A MEAL & TWO TABLETS BY MOUTH EVERY EVENING WITH A MEAL.. 270 tablet 1   metoprolol  succinate (TOPROL -XL) 25 MG 24 hr tablet TAKE 1 TABLET (25 MG TOTAL) BY MOUTH DAILY. 90 tablet 3   pravastatin  (PRAVACHOL ) 40 MG tablet 36 PATIENT IS TAKING M-W-F ONLY 108 tablet 1   sildenafil  (VIAGRA ) 25 MG tablet Take 1 tablet (25 mg total) by mouth daily as needed for erectile dysfunction. 15 tablet 3   sulfamethoxazole -trimethoprim  (BACTRIM  DS) 800-160 MG tablet Take 1 tablet by mouth 2 (two) times daily for 10  days. 20 tablet 0   tamsulosin  (FLOMAX ) 0.4 MG CAPS capsule TAKE 2 CAPSULES BY MOUTH EVERY DAY 180 capsule 2   traZODone  (DESYREL ) 100 MG tablet Take 1 tablet (100 mg total) by mouth at bedtime. for sleep 90 tablet 3   traZODone  (DESYREL ) 50 MG tablet TAKE 1 TABLET BY MOUTH AT BEDTIME AS NEEDED FOR SLEEP 90 tablet 1   No current facility-administered medications on file prior to visit.    Review of Systems  Constitutional:  Positive for fatigue. Negative for chills and fever.  Respiratory:  Negative for cough.   Cardiovascular:  Negative for chest pain and palpitations.  Gastrointestinal:  Negative for diarrhea, nausea and vomiting.  Genitourinary:  Negative for difficulty urinating and dysuria.   Musculoskeletal:  Negative for back pain.      Objective:    BP 124/60   Pulse 76   Temp 98.3 F (36.8 C)   Ht 6' 1 (1.854 m)   Wt 206 lb (93.4 kg)   SpO2 95%   BMI 27.18 kg/m   BP Readings from Last 3 Encounters:  01/11/24 124/60  01/10/24 (!) 150/80  12/25/23 121/72   Wt Readings from Last 3 Encounters:  01/11/24 206 lb (93.4 kg)  01/10/24 203 lb 6.4 oz (92.3 kg)  10/19/23 204 lb 9.6 oz (92.8 kg)    Physical Exam Vitals reviewed.  Constitutional:      Appearance: He is well-developed.  Cardiovascular:     Rate and Rhythm: Regular rhythm.     Heart sounds: Normal heart sounds.  Pulmonary:     Effort: Pulmonary effort is normal. No respiratory distress.     Breath sounds: Normal breath sounds. No wheezing, rhonchi or rales.  Abdominal:     Tenderness: There is no right CVA tenderness or left CVA tenderness.  Skin:    General: Skin is warm and dry.  Neurological:     Mental Status: He is alert.  Psychiatric:        Speech: Speech normal.        Behavior: Behavior normal.

## 2024-01-12 ENCOUNTER — Telehealth: Payer: Self-pay | Admitting: Family

## 2024-01-12 DIAGNOSIS — N10 Acute tubulo-interstitial nephritis: Secondary | ICD-10-CM

## 2024-01-12 LAB — URINE CULTURE
MICRO NUMBER:: 16045161
SPECIMEN QUALITY:: ADEQUATE

## 2024-01-12 MED ORDER — SULFAMETHOXAZOLE-TRIMETHOPRIM 800-160 MG PO TABS: 1.0000 | ORAL_TABLET | Freq: Two times a day (BID) | ORAL | 0 refills | Status: AC

## 2024-01-12 NOTE — Telephone Encounter (Signed)
 call patient I collaborated with Dr. Twylla, urology  He shared concern and agreed with Bactrim .  He advised to treat for prostatitis which takes a longer antibiotic course   he advised to take Bactrim  (antibiotic) for 4 weeks total.  I prescribed 10 days worth which he picked up yesterday.    I have now sent in a new prescription with 20 more days of bactrim  to complete a 30-day supply   Please remind patient he is only taking metformin  500 mg daily versus usual 1500 mg daily as Bactrim  can make metformin  more potent while on this antibiotic.

## 2024-01-12 NOTE — Telephone Encounter (Signed)
 Spoke to pt discussed note below, pt verbalized understanding   I collaborated with Dr. Twylla, urology   He shared concern and agreed with Bactrim .  He advised to treat for prostatitis which takes a longer antibiotic course    he advised to take Bactrim  (antibiotic) for 4 weeks total.  I prescribed 10 days worth which he picked up yesterday.     I have now sent in a new prescription with 20 more days of bactrim  to complete a 30-day supply     Please remind patient he is only taking metformin  500 mg daily versus usual 1500 mg daily as Bactrim  can make metformin  more potent while on this antibiotic.

## 2024-01-13 LAB — RESPIRATORY VIRUS PANEL

## 2024-01-15 ENCOUNTER — Encounter: Payer: Self-pay | Admitting: Family

## 2024-01-15 DIAGNOSIS — H26492 Other secondary cataract, left eye: Secondary | ICD-10-CM | POA: Diagnosis not present

## 2024-01-15 DIAGNOSIS — E119 Type 2 diabetes mellitus without complications: Secondary | ICD-10-CM | POA: Diagnosis not present

## 2024-01-15 DIAGNOSIS — H43813 Vitreous degeneration, bilateral: Secondary | ICD-10-CM | POA: Diagnosis not present

## 2024-01-15 DIAGNOSIS — Z961 Presence of intraocular lens: Secondary | ICD-10-CM | POA: Diagnosis not present

## 2024-01-15 LAB — HM DIABETES EYE EXAM

## 2024-01-17 ENCOUNTER — Ambulatory Visit: Payer: PPO | Admitting: Family

## 2024-01-17 ENCOUNTER — Encounter: Payer: Self-pay | Admitting: Family

## 2024-01-17 VITALS — BP 126/76 | HR 76 | Temp 97.7°F | Ht 73.0 in | Wt 205.6 lb

## 2024-01-17 DIAGNOSIS — J40 Bronchitis, not specified as acute or chronic: Secondary | ICD-10-CM | POA: Diagnosis not present

## 2024-01-17 DIAGNOSIS — N419 Inflammatory disease of prostate, unspecified: Secondary | ICD-10-CM | POA: Diagnosis not present

## 2024-01-17 DIAGNOSIS — R899 Unspecified abnormal finding in specimens from other organs, systems and tissues: Secondary | ICD-10-CM

## 2024-01-17 DIAGNOSIS — D649 Anemia, unspecified: Secondary | ICD-10-CM | POA: Diagnosis not present

## 2024-01-17 LAB — CBC WITH DIFFERENTIAL/PLATELET
Basophils Absolute: 0.1 10*3/uL (ref 0.0–0.1)
Basophils Relative: 0.8 % (ref 0.0–3.0)
Eosinophils Absolute: 0.2 10*3/uL (ref 0.0–0.7)
Eosinophils Relative: 2 % (ref 0.0–5.0)
HCT: 35.4 % — ABNORMAL LOW (ref 39.0–52.0)
Hemoglobin: 11.7 g/dL — ABNORMAL LOW (ref 13.0–17.0)
Lymphocytes Relative: 17.2 % (ref 12.0–46.0)
Lymphs Abs: 2 10*3/uL (ref 0.7–4.0)
MCHC: 32.9 g/dL (ref 30.0–36.0)
MCV: 94.6 fL (ref 78.0–100.0)
Monocytes Absolute: 0.7 10*3/uL (ref 0.1–1.0)
Monocytes Relative: 5.8 % (ref 3.0–12.0)
Neutro Abs: 8.5 10*3/uL — ABNORMAL HIGH (ref 1.4–7.7)
Neutrophils Relative %: 74.2 % (ref 43.0–77.0)
Platelets: 448 10*3/uL — ABNORMAL HIGH (ref 150.0–400.0)
RBC: 3.74 Mil/uL — ABNORMAL LOW (ref 4.22–5.81)
RDW: 13.8 % (ref 11.5–15.5)
WBC: 11.4 10*3/uL — ABNORMAL HIGH (ref 4.0–10.5)

## 2024-01-17 LAB — BASIC METABOLIC PANEL
BUN: 16 mg/dL (ref 6–23)
CO2: 27 meq/L (ref 19–32)
Calcium: 9.8 mg/dL (ref 8.4–10.5)
Chloride: 101 meq/L (ref 96–112)
Creatinine, Ser: 0.86 mg/dL (ref 0.40–1.50)
GFR: 84.1 mL/min (ref >60.00)
Glucose, Bld: 119 mg/dL — ABNORMAL HIGH (ref 70–99)
Potassium: 4.2 meq/L (ref 3.5–5.1)
Sodium: 136 meq/L (ref 135–145)

## 2024-01-17 LAB — B12 AND FOLATE PANEL
Folate: 6 ng/mL (ref >5.9)
Vitamin B-12: >1537 pg/mL — ABNORMAL HIGH (ref 211–911)

## 2024-01-17 NOTE — Progress Notes (Signed)
[id number]

## 2024-01-17 NOTE — Assessment & Plan Note (Signed)
Presentation raises concern for prostatitis.  Continue 30-day course of Bactrim twice daily.  Compliant with probiotics.

## 2024-01-17 NOTE — Progress Notes (Signed)
Assessment & Plan:  Abnormal laboratory test -     Basic metabolic panel -     B12 and Folate Panel -     CBC with Differential/Platelet -     Iron, TIBC and Ferritin Panel  Bronchitis Assessment & Plan: Improved over time.  HEENT exam without nasal lesion.  Advised to stop azelastine, use Vaseline, cool-mist humidifier to add moisture to the nasal passages.  If bleeding persists, advised ENT consult.  Advised he may increase Tessalon to 200 mg 3 times daily as needed.  Advised OTC Pepcid AC for GERD if cough persists.   Prostatitis, unspecified prostatitis type Assessment & Plan: Presentation raises concern for prostatitis.  Continue 30-day course of Bactrim twice daily.  Compliant with probiotics.      Return precautions given.   Risks, benefits, and alternatives of the medications and treatment plan prescribed today were discussed, and patient expressed understanding.   Education regarding symptom management and diagnosis given to patient on AVS either electronically or printed.  No follow-ups on file.  Larry Plowman, FNP  Subjective:    Patient ID: Larry Righter., male    DOB: 1946/05/13, 78 y.o.   MRN: [id number]  CC: Larry Deboard. is a 78 y.o. male who presents today for follow up.   HPI: Accompanied by wife today Follow-up suspected prostatitis.   He is feeling better today.  Fatigue has improved  he is compliant with Bactrim for 30 days.  Compliant with metformin 500 mg daily He complains of occasional productive cough, worse in the morning and at night when laying down.  Cough improved overall.  Denies fever, chills, rigors, epigastric pain.  In right nostril he has seen scant blood with blowing his nose.  Compliant with azelastine, Tessalon 100 mg 3 times daily    Chest x-ray without acute findings 01/10/2024 Follow-up urology 12/2024  No rectal bleeding No h/o hemorrhoids.   Allergies: Ibuprofen Current Outpatient Medications on File Prior to  Visit  Medication Sig Dispense Refill   ACCU-CHEK SOFTCLIX LANCETS lancets Use up to 4 times daily to check blood sugar. Diagnosis E11.9 100 each 12   acetaminophen (TYLENOL) 500 MG tablet Take 1,500 mg by mouth 2 (two) times daily.     albuterol (VENTOLIN HFA) 108 (90 Base) MCG/ACT inhaler TAKE 2 PUFFS BY MOUTH EVERY 6 HOURS AS NEEDED FOR WHEEZE OR SHORTNESS OF BREATH 1 each 2   amLODipine (NORVASC) 10 MG tablet TAKE 1 TABLET BY MOUTH DAILY WITH LUNCH. 90 tablet 3   azelastine (ASTELIN) 0.1 % nasal spray Place 1 spray into both nostrils 2 (two) times daily. Use in each nostril as directed 30 mL 4   benzonatate (TESSALON) 100 MG capsule Take 1 capsule (100 mg total) by mouth 3 (three) times daily as needed for cough. 20 capsule 1   Cyanocobalamin (B-12) 5000 MCG SUBL Place 5,000 mcg under the tongue daily.      docusate sodium (COLACE) 100 MG capsule Take 100 mg by mouth daily.     DULoxetine (CYMBALTA) 30 MG capsule TAKE 1 CAPSULE BY MOUTH 2 TIMES DAILY. 180 capsule 2   glucose blood (ONETOUCH VERIO) test strip TEST 3 TIMES A DAY 100 each 0   loratadine (CLARITIN) 10 MG tablet Take 10 mg by mouth every morning.     losartan (COZAAR) 100 MG tablet TAKE 1 TABLET BY MOUTH EVERY DAY 90 tablet 3   metFORMIN (GLUCOPHAGE) 500 MG tablet TAKE TABLET BY MOUTH EVERY MORNING  WITH A MEAL & TWO TABLETS BY MOUTH EVERY EVENING WITH A MEAL.. 270 tablet 1   metoprolol succinate (TOPROL-XL) 25 MG 24 hr tablet TAKE 1 TABLET (25 MG TOTAL) BY MOUTH DAILY. 90 tablet 3   pravastatin (PRAVACHOL) 40 MG tablet 36 PATIENT IS TAKING M-W-F ONLY 108 tablet 1   sulfamethoxazole-trimethoprim (BACTRIM DS) 800-160 MG tablet Take 1 tablet by mouth 2 (two) times daily for 20 days. 40 tablet 0   tamsulosin (FLOMAX) 0.4 MG CAPS capsule TAKE 2 CAPSULES BY MOUTH EVERY DAY 180 capsule 2   traZODone (DESYREL) 100 MG tablet Take 1 tablet (100 mg total) by mouth at bedtime. for sleep 90 tablet 3   No current facility-administered  medications on file prior to visit.    Review of Systems  Constitutional:  Negative for chills and fever.  HENT:  Positive for congestion. Negative for sore throat.   Respiratory:  Positive for cough. Negative for shortness of breath and wheezing.   Cardiovascular:  Negative for chest pain and palpitations.  Gastrointestinal:  Negative for nausea and vomiting.      Objective:    BP 126/76   Pulse 76   Temp 97.7 F (36.5 C) (Oral)   Ht 6\' 1"  (1.854 m)   Wt 205 lb 9.6 oz (93.3 kg)   SpO2 95%   BMI 27.13 kg/m  BP Readings from Last 3 Encounters:  01/17/24 126/76  01/11/24 124/60  01/10/24 (!) 150/80   Wt Readings from Last 3 Encounters:  01/17/24 205 lb 9.6 oz (93.3 kg)  01/11/24 206 lb (93.4 kg)  01/10/24 203 lb 6.4 oz (92.3 kg)    Physical Exam Vitals reviewed.  Constitutional:      Appearance: He is well-developed.  HENT:     Head: Normocephalic and atraumatic.     Right Ear: Hearing, tympanic membrane, ear canal and external ear normal. No decreased hearing noted. No drainage, swelling or tenderness. No middle ear effusion. Tympanic membrane is not injected, erythematous or bulging.     Left Ear: Hearing, tympanic membrane, ear canal and external ear normal. No decreased hearing noted. No drainage, swelling or tenderness.  No middle ear effusion. Tympanic membrane is not injected, erythematous or bulging.     Nose: Nose normal.     Right Sinus: No maxillary sinus tenderness or frontal sinus tenderness.     Left Sinus: No maxillary sinus tenderness or frontal sinus tenderness.     Comments: No nasal lesions visualized in either nostril.  Nostrils are very dry    Mouth/Throat:     Pharynx: Uvula midline. No oropharyngeal exudate or posterior oropharyngeal erythema.     Tonsils: No tonsillar abscesses.  Eyes:     Conjunctiva/sclera: Conjunctivae normal.  Cardiovascular:     Rate and Rhythm: Regular rhythm.     Heart sounds: Normal heart sounds.  Pulmonary:      Effort: Pulmonary effort is normal. No respiratory distress.     Breath sounds: Normal breath sounds. No wheezing, rhonchi or rales.  Lymphadenopathy:     Head:     Right side of head: No submental, submandibular, tonsillar, preauricular, posterior auricular or occipital adenopathy.     Left side of head: No submental, submandibular, tonsillar, preauricular, posterior auricular or occipital adenopathy.     Cervical: No cervical adenopathy.  Skin:    General: Skin is warm and dry.  Neurological:     Mental Status: He is alert.  Psychiatric:        Speech:  Speech normal.        Behavior: Behavior normal.

## 2024-01-17 NOTE — Patient Instructions (Signed)
Increase tessalon to 200mg  three times daily  Please consider starting over the counter pepcid ac in the evening.

## 2024-01-17 NOTE — Assessment & Plan Note (Addendum)
Improved over time.  HEENT exam without nasal lesion.  Advised to stop azelastine, use Vaseline, cool-mist humidifier to add moisture to the nasal passages.  If bleeding persists, advised ENT consult.  Advised he may increase Tessalon to 200 mg 3 times daily as needed.  Advised OTC Pepcid AC for GERD if cough persists.

## 2024-01-18 LAB — IRON,TIBC AND FERRITIN PANEL
%SAT: 19 % — ABNORMAL LOW (ref 20–48)
Ferritin: 190 ng/mL (ref 24–380)
Iron: 54 ug/dL (ref 50–180)
TIBC: 280 ug/dL (ref 250–425)

## 2024-01-23 ENCOUNTER — Encounter: Payer: Self-pay | Admitting: Family

## 2024-01-30 ENCOUNTER — Other Ambulatory Visit: Payer: Self-pay | Admitting: Urology

## 2024-01-30 DIAGNOSIS — N400 Enlarged prostate without lower urinary tract symptoms: Secondary | ICD-10-CM

## 2024-02-02 ENCOUNTER — Telehealth: Payer: Self-pay | Admitting: Urology

## 2024-02-02 NOTE — Telephone Encounter (Signed)
Error

## 2024-02-08 ENCOUNTER — Other Ambulatory Visit: Payer: Self-pay | Admitting: Urology

## 2024-02-08 DIAGNOSIS — N400 Enlarged prostate without lower urinary tract symptoms: Secondary | ICD-10-CM

## 2024-02-09 ENCOUNTER — Telehealth: Payer: Self-pay | Admitting: Family

## 2024-02-09 NOTE — Telephone Encounter (Signed)
 Copied from CRM [id number]. Topic: Medicare AWV >> Feb 09, 2024  1:23 PM Payton Doughty wrote: Reason for CRM: Called LVM 02/09/2024 to schedule AWV. Please schedule office or virtual visits.  Verlee Rossetti; Care Guide Ambulatory Clinical Support [place] [facility] Direct Dial: [phone]

## 2024-02-19 ENCOUNTER — Encounter: Payer: Self-pay | Admitting: Urology

## 2024-02-19 ENCOUNTER — Ambulatory Visit: Payer: PPO | Admitting: Urology

## 2024-02-19 VITALS — BP 189/95 | HR 73 | Ht 72.0 in | Wt 202.0 lb

## 2024-02-19 DIAGNOSIS — N400 Enlarged prostate without lower urinary tract symptoms: Secondary | ICD-10-CM

## 2024-02-19 DIAGNOSIS — N200 Calculus of kidney: Secondary | ICD-10-CM

## 2024-02-19 DIAGNOSIS — Z87440 Personal history of urinary (tract) infections: Secondary | ICD-10-CM

## 2024-02-19 MED ORDER — TAMSULOSIN HCL 0.4 MG PO CAPS: 0.8000 mg | ORAL_CAPSULE | Freq: Every day | ORAL | 3 refills | Status: AC

## 2024-02-19 NOTE — Progress Notes (Signed)
 I, Maysun Anabel Bene, acting as a scribe for Riki Altes, MD., have documented all relevant documentation on the behalf of Riki Altes, MD, as directed by Riki Altes, MD while in the presence of Riki Altes, MD.  02/19/2024 1:43 PM   Larry Roman. 1946/05/02 [id number]  Referring provider: Allegra Grana, FNP [address] [place],  [place] [address]  Chief Complaint  Patient presents with   Nephrolithiasis    Urologic history: 1.  BPH with LUTS Tamsulosin daily   2.  Nephrolithiasis CT 07/19/2021 with 14 mm left proximal ureteral calculus/hydronephrosis and bilateral nonobstructing renal calculi Left ureteroscopy/stone removal 08/24/2021; stone impacted Declined metabolic evaluation   3.  Bilateral renal cysts  HPI: Larry Roman. is a 78 y.o. male presents for medication refill and recent UTI.  Was seen by PCP 12/2023 with UTI symptoms and urinary culture positive for E. coli. He was treated with a 10 day course of Keflex, which helped initially, however had recurrent symptoms. Another positive culture for E. coli February 2025. He was treated with 30 day course of antibiotics for prostatitis coverage and had a resolution of his symptoms.  He has no complaints today.   PMH: Past Medical History:  Diagnosis Date   Arthritis    Depression    DM (diabetes mellitus) (HCC)    Extrinsic asthma, unspecified    childhood   GERD (gastroesophageal reflux disease)    rare   History of kidney stones    Hives of unknown origin    HTN (hypertension)    Other allergy, other than to medicinal agents    RBBB    Sleep apnea    25 years ago mild lost weight no longer uses cpap   Wound of right leg 08/2018   area just below knee size of quarter, reddened around edges    Surgical History: Past Surgical History:  Procedure Laterality Date   BACK SURGERY  1995   ruptured disc encapsulated nerves   CATARACT EXTRACTION, BILATERAL  2016   Dr.  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 If you have any questions, ask your nurse or doctor.          STOP taking these medications    B-12 5000 MCG Subl Stopped by: Riki Altes   benzonatate 100 MG capsule Commonly known as: TESSALON Stopped by: Riki Altes       TAKE these medications    Accu-Chek  Softclix Lancets lancets Use up to 4 times daily to check blood sugar. Diagnosis E11.9   acetaminophen 500 MG tablet Commonly known as: TYLENOL Take 1,500 mg by mouth 2 (two) times daily.   albuterol 108 (90 Base) MCG/ACT inhaler Commonly known as: VENTOLIN HFA TAKE 2 PUFFS BY MOUTH EVERY 6 HOURS AS NEEDED FOR WHEEZE OR SHORTNESS OF BREATH   amLODipine 10 MG tablet Commonly known as: NORVASC TAKE 1 TABLET BY MOUTH DAILY WITH LUNCH.   azelastine 0.1 % nasal spray Commonly known as: ASTELIN Place 1 spray into both nostrils 2 (two) times daily. Use in each nostril as directed   docusate sodium 100 MG capsule Commonly known as: COLACE Take 100 mg by mouth  daily.   DULoxetine 30 MG capsule Commonly known as: CYMBALTA TAKE 1 CAPSULE BY MOUTH 2 TIMES DAILY.   glucose blood test strip Commonly known as: OneTouch Verio TEST 3 TIMES A DAY   loratadine 10 MG tablet Commonly known as: CLARITIN Take 10 mg by mouth every morning.   losartan 100 MG tablet Commonly known as: COZAAR TAKE 1 TABLET BY MOUTH EVERY DAY   metFORMIN 500 MG tablet Commonly known as: GLUCOPHAGE TAKE TABLET BY MOUTH EVERY MORNING WITH A MEAL & TWO TABLETS BY MOUTH EVERY EVENING WITH A MEAL..   metoprolol succinate 25 MG 24 hr tablet Commonly known as: TOPROL-XL TAKE 1 TABLET (25 MG TOTAL) BY MOUTH DAILY.   pravastatin 40 MG tablet Commonly known as: PRAVACHOL 36 PATIENT IS TAKING M-W-F ONLY   tamsulosin 0.4 MG Caps capsule Commonly known as: FLOMAX Take 2 capsules (0.8 mg total) by mouth daily.   traZODone 100 MG tablet Commonly known as: DESYREL Take 1 tablet (100 mg total) by mouth at bedtime. for sleep        Allergies:  Allergies  Allergen Reactions   Ibuprofen Other (See Comments)    Ibuprofen and motrin causes bp to elevate    Family History: Family History  Problem Relation Age of Onset   Heart failure Father    Heart attack Mother     Social History:  reports that he has never smoked. He has never used smokeless tobacco. He reports current alcohol use. He reports that he does not use drugs.   Physical Exam: BP (!) 189/95   Pulse 73   Ht 6' (1.829 m)   Wt 202 lb (91.6 kg)   BMI 27.40 kg/m   Constitutional:  Alert and oriented, No acute distress. HEENT: [place] AT, moist mucus membranes.  Trachea midline, no masses. Cardiovascular: No clubbing, cyanosis, or edema. Respiratory: Normal respiratory effort, no increased work of breathing. GI: Abdomen is soft, nontender, nondistended, no abdominal masses Skin: No rashes, bruises or suspicious lesions. Neurologic: Grossly intact, no focal deficits, moving all 4 extremities. Psychiatric:  Normal mood and affect.   Urinalysis Dipstick 1+ protein, microscopy negative.   Assessment & Plan:    1. BPH with LUTS Stable Tamsulosin refill  2. History UTI Presently asymptomatic after a 30-day course of antibiotics for presumed prostatitis.  Uurinalysis today is clear  Follow-up appointment is scheduled January 2025.  [facility] [address] [place], [place] [address] [phone]

## 2024-02-20 LAB — URINALYSIS, COMPLETE
Bilirubin, UA: NEGATIVE
Glucose, UA: NEGATIVE
Ketones, UA: NEGATIVE
Leukocytes,UA: NEGATIVE
Nitrite, UA: NEGATIVE
RBC, UA: NEGATIVE
Specific Gravity, UA: 1.02 (ref 1.005–1.030)
Urobilinogen, Ur: 0.2 mg/dL (ref 0.2–1.0)
pH, UA: 6.5 (ref 5.0–7.5)

## 2024-02-20 LAB — MICROSCOPIC EXAMINATION
Bacteria, UA: NONE SEEN
Epithelial Cells (non renal): NONE SEEN /HPF (ref 0–10)

## 2024-03-07 ENCOUNTER — Telehealth: Payer: Self-pay | Admitting: Family

## 2024-03-07 NOTE — Telephone Encounter (Signed)
 Reviewed chart  Urine microalbumin creatinine ratio updated 09/27/2023.  Follow-up Dr Margarette Canada 09/24/2024.  Continue losartan 100 mg daily

## 2024-03-26 ENCOUNTER — Encounter: Payer: Self-pay | Admitting: Family

## 2024-03-28 NOTE — Telephone Encounter (Signed)
 Spoke to pt and scheduled him for in ov on 4/28

## 2024-04-01 ENCOUNTER — Ambulatory Visit: Admitting: Family

## 2024-04-02 ENCOUNTER — Encounter: Payer: Self-pay | Admitting: Family

## 2024-04-02 ENCOUNTER — Ambulatory Visit (INDEPENDENT_AMBULATORY_CARE_PROVIDER_SITE_OTHER): Admitting: Family

## 2024-04-02 VITALS — BP 132/78 | HR 76 | Temp 97.6°F | Ht 73.0 in | Wt 205.8 lb

## 2024-04-02 DIAGNOSIS — R5383 Other fatigue: Secondary | ICD-10-CM | POA: Diagnosis not present

## 2024-04-02 DIAGNOSIS — G4733 Obstructive sleep apnea (adult) (pediatric): Secondary | ICD-10-CM

## 2024-04-02 DIAGNOSIS — Z859 Personal history of malignant neoplasm, unspecified: Secondary | ICD-10-CM | POA: Diagnosis not present

## 2024-04-02 DIAGNOSIS — Z7984 Long term (current) use of oral hypoglycemic drugs: Secondary | ICD-10-CM | POA: Diagnosis not present

## 2024-04-02 DIAGNOSIS — M2550 Pain in unspecified joint: Secondary | ICD-10-CM

## 2024-04-02 DIAGNOSIS — E119 Type 2 diabetes mellitus without complications: Secondary | ICD-10-CM | POA: Diagnosis not present

## 2024-04-02 LAB — TSH: TSH: 1.19 u[IU]/mL (ref 0.35–5.50)

## 2024-04-02 LAB — MICROALBUMIN / CREATININE URINE RATIO
Creatinine,U: 132.1 mg/dL
Microalb Creat Ratio: 249.5 mg/g — ABNORMAL HIGH (ref 0.0–30.0)
Microalb, Ur: 33 mg/dL — ABNORMAL HIGH (ref 0.0–1.9)

## 2024-04-02 LAB — COMPREHENSIVE METABOLIC PANEL WITH GFR
ALT: 16 U/L (ref 0–53)
AST: 16 U/L (ref 0–37)
Albumin: 4.5 g/dL (ref 3.5–5.2)
Alkaline Phosphatase: 67 U/L (ref 39–117)
BUN: 17 mg/dL (ref 6–23)
CO2: 29 meq/L (ref 19–32)
Calcium: 10.4 mg/dL (ref 8.4–10.5)
Chloride: 102 meq/L (ref 96–112)
Creatinine, Ser: 0.77 mg/dL (ref 0.40–1.50)
GFR: 86.82 mL/min (ref >60.00)
Glucose, Bld: 110 mg/dL — ABNORMAL HIGH (ref 70–99)
Potassium: 4.3 meq/L (ref 3.5–5.1)
Sodium: 137 meq/L (ref 135–145)
Total Bilirubin: 0.8 mg/dL (ref 0.2–1.2)
Total Protein: 7.5 g/dL (ref 6.0–8.3)

## 2024-04-02 LAB — B12 AND FOLATE PANEL
Folate: 6.9 ng/mL (ref >5.9)
Vitamin B-12: 475 pg/mL (ref 211–911)

## 2024-04-02 LAB — CBC WITH DIFFERENTIAL/PLATELET
Basophils Absolute: 0.1 10*3/uL (ref 0.0–0.1)
Basophils Relative: 0.8 % (ref 0.0–3.0)
Eosinophils Absolute: 0.1 10*3/uL (ref 0.0–0.7)
Eosinophils Relative: 2.1 % (ref 0.0–5.0)
HCT: 40 % (ref 39.0–52.0)
Hemoglobin: 13.3 g/dL (ref 13.0–17.0)
Lymphocytes Relative: 23.1 % (ref 12.0–46.0)
Lymphs Abs: 1.6 10*3/uL (ref 0.7–4.0)
MCHC: 33.4 g/dL (ref 30.0–36.0)
MCV: 95.3 fl (ref 78.0–100.0)
Monocytes Absolute: 0.5 10*3/uL (ref 0.1–1.0)
Monocytes Relative: 7 % (ref 3.0–12.0)
Neutro Abs: 4.7 10*3/uL (ref 1.4–7.7)
Neutrophils Relative %: 67 % (ref 43.0–77.0)
Platelets: 290 10*3/uL (ref 150.0–400.0)
RBC: 4.2 Mil/uL — ABNORMAL LOW (ref 4.22–5.81)
RDW: 13.6 % (ref 11.5–15.5)
WBC: 7 10*3/uL (ref 4.0–10.5)

## 2024-04-02 LAB — HEMOGLOBIN A1C: Hgb A1c MFr Bld: 6 % (ref 4.6–6.5)

## 2024-04-02 MED ORDER — GABAPENTIN 100 MG PO CAPS: 100.0000 mg | ORAL_CAPSULE | Freq: Three times a day (TID) | ORAL | 3 refills | Status: DC

## 2024-04-02 NOTE — Progress Notes (Signed)
 Assessment & Plan:  Arthralgia, unspecified joint Assessment & Plan: Chronic, well-controlled.  Discussed fatigue as it relates to chronic arthralgia  ,deconditioning.  Continue Tylenol  1300mg   every morning, 1300 mg midday 1000mg  at bedtime.  Start gabapentin  100 mg 3 times daily and titrate. Pending labs for evaluation of metabolic etiology for fatigue.Close follow up.   Orders: -     Gabapentin ; Take 1 capsule (100 mg total) by mouth 3 (three) times daily.  Dispense: 90 capsule; Refill: 3  Controlled type 2 diabetes mellitus without complication, without long-term current use of insulin (HCC) Assessment & Plan:  Excellent control. Continue 500mg  metformin  every morning, 1000 mg every evening   Orders: -     Hemoglobin A1c -     Microalbumin / creatinine urine ratio  Other fatigue -     CBC with Differential/Platelet -     Comprehensive metabolic panel with GFR -     TSH -     B12 and Folate Panel -     Iron, TIBC and Ferritin Panel  OSA (obstructive sleep apnea) Assessment & Plan: He is no longer wearing CPAP therapy after 40 pound weight loss.  He politely declines retesting with sleep study.  Sleep is restorative.  I agreed to defer at this time.       Return precautions given.   Risks, benefits, and alternatives of the medications and treatment plan prescribed today were discussed, and patient expressed understanding.   Education regarding symptom management and diagnosis given to patient on AVS either electronically or printed.  Return in about 6 weeks (around 05/14/2024).  Bascom Bossier, FNP  Subjective:    Patient ID: Larry Sabin., male    DOB: January 10, 1946, 78 y.o.   MRN: [id number]  CC: Larry Brinn. is a 78 y.o. male who presents today for follow up.   HPI: Complains of fatigue, worsened since stopping B12 multivitamin 2months ago  He thinks chronic joint pain and stiffness has caused the fatigue.   Sleeping well. Sleep is restorative.    Exercise improves energy. He does the blowing and weed eating in his yard; he feels better when 'moving'  H/o osa; he is not wearing cipap. He has lost 40 lbs since diagnosis.   He feels UTI 01/2024 caused him to decondition.   Denies CP, sob, cough, congestion, unusual weight loss  He complains of chronic bilateral knee pain, lower back pain for years, which has been stiffness. He does feel pain is 'intensifying'.   He sees chiropractor regularly; he is taking tylenol  1300mg  qam, 1300mg  midday and 1000mg  qpm with some relief.   Compliant cymablta 30mg  BID        Never smoker  Allergies: Ibuprofen Current Outpatient Medications on File Prior to Visit  Medication Sig Dispense Refill   ACCU-CHEK SOFTCLIX LANCETS lancets Use up to 4 times daily to check blood sugar. Diagnosis E11.9 100 each 12   acetaminophen  (TYLENOL ) 500 MG tablet Take 1,500 mg by mouth 2 (two) times daily.     albuterol  (VENTOLIN  HFA) 108 (90 Base) MCG/ACT inhaler TAKE 2 PUFFS BY MOUTH EVERY 6 HOURS AS NEEDED FOR WHEEZE OR SHORTNESS OF BREATH 1 each 2   amLODipine  (NORVASC ) 10 MG tablet TAKE 1 TABLET BY MOUTH DAILY WITH LUNCH. 90 tablet 3   azelastine  (ASTELIN ) 0.1 % nasal spray Place 1 spray into both nostrils 2 (two) times daily. Use in each nostril as directed 30 mL 4   docusate sodium  (COLACE) 100  MG capsule Take 100 mg by mouth daily.     DULoxetine  (CYMBALTA ) 30 MG capsule TAKE 1 CAPSULE BY MOUTH 2 TIMES DAILY. 180 capsule 2   glucose blood (ONETOUCH VERIO) test strip TEST 3 TIMES A DAY 100 each 0   loratadine  (CLARITIN ) 10 MG tablet Take 10 mg by mouth every morning.     losartan  (COZAAR ) 100 MG tablet TAKE 1 TABLET BY MOUTH EVERY DAY 90 tablet 3   metFORMIN  (GLUCOPHAGE ) 500 MG tablet TAKE TABLET BY MOUTH EVERY MORNING WITH A MEAL & TWO TABLETS BY MOUTH EVERY EVENING WITH A MEAL.. 270 tablet 1   metoprolol  succinate (TOPROL -XL) 25 MG 24 hr tablet TAKE 1 TABLET (25 MG TOTAL) BY MOUTH DAILY. 90 tablet 3    pravastatin  (PRAVACHOL ) 40 MG tablet 36 PATIENT IS TAKING M-W-F ONLY 108 tablet 1   tamsulosin  (FLOMAX ) 0.4 MG CAPS capsule Take 2 capsules (0.8 mg total) by mouth daily. 180 capsule 3   traZODone  (DESYREL ) 100 MG tablet Take 1 tablet (100 mg total) by mouth at bedtime. for sleep 90 tablet 3   No current facility-administered medications on file prior to visit.    Review of Systems  Constitutional:  Positive for fatigue. Negative for chills, diaphoresis, fever and unexpected weight change.  Respiratory:  Negative for cough.   Cardiovascular:  Negative for chest pain and palpitations.  Gastrointestinal:  Negative for nausea and vomiting.  Musculoskeletal:  Positive for arthralgias and back pain.      Objective:    BP 132/78   Pulse 76   Temp 97.6 F (36.4 C) (Oral)   Ht 6\' 1"  (1.854 m)   Wt 205 lb 12.8 oz (93.4 kg)   SpO2 97%   BMI 27.15 kg/m  BP Readings from Last 3 Encounters:  04/02/24 132/78  02/19/24 (!) 189/95  01/17/24 126/76   Wt Readings from Last 3 Encounters:  04/02/24 205 lb 12.8 oz (93.4 kg)  02/19/24 202 lb (91.6 kg)  01/17/24 205 lb 9.6 oz (93.3 kg)    Physical Exam Vitals reviewed.  Constitutional:      Appearance: He is well-developed.  Cardiovascular:     Rate and Rhythm: Regular rhythm.     Heart sounds: Normal heart sounds.  Pulmonary:     Effort: Pulmonary effort is normal. No respiratory distress.     Breath sounds: Normal breath sounds. No wheezing, rhonchi or rales.  Musculoskeletal:     Lumbar back: No swelling, spasms or tenderness. Normal range of motion.     Comments: Full range of motion with flexion, extension, lateral side bends. No pain, numbness, tingling elicited with single leg raise bilaterally. No rash.  Skin:    General: Skin is warm and dry.  Neurological:     Mental Status: He is alert.  Psychiatric:        Speech: Speech normal.        Behavior: Behavior normal.

## 2024-04-03 LAB — IRON,TIBC AND FERRITIN PANEL
%SAT: 24 % (ref 20–48)
Ferritin: 59 ng/mL (ref 24–380)
Iron: 79 ug/dL (ref 50–180)
TIBC: 335 ug/dL (ref 250–425)

## 2024-04-05 NOTE — Assessment & Plan Note (Addendum)
 Chronic, well-controlled.  Discussed fatigue as it relates to chronic arthralgia  ,deconditioning.  Continue Tylenol  1300mg   every morning, 1300 mg midday 1000mg  at bedtime.  Start gabapentin  100 mg 3 times daily and titrate. Pending labs for evaluation of metabolic etiology for fatigue.Close follow up.

## 2024-04-05 NOTE — Assessment & Plan Note (Signed)
  Excellent control. Continue 500mg  metformin  every morning, 1000 mg every evening

## 2024-04-05 NOTE — Assessment & Plan Note (Signed)
 He is no longer wearing CPAP therapy after 40 pound weight loss.  He politely declines retesting with sleep study.  Sleep is restorative.  I agreed to defer at this time.

## 2024-04-11 ENCOUNTER — Other Ambulatory Visit (INDEPENDENT_AMBULATORY_CARE_PROVIDER_SITE_OTHER)

## 2024-04-11 ENCOUNTER — Encounter: Payer: Self-pay | Admitting: Family

## 2024-04-11 ENCOUNTER — Other Ambulatory Visit: Payer: Self-pay

## 2024-04-11 DIAGNOSIS — R300 Dysuria: Secondary | ICD-10-CM

## 2024-04-11 NOTE — Telephone Encounter (Signed)
 Spoke to pt scheduled an in ov pt came in today and gave urine sample

## 2024-04-12 ENCOUNTER — Encounter: Payer: Self-pay | Admitting: Family

## 2024-04-12 ENCOUNTER — Ambulatory Visit (INDEPENDENT_AMBULATORY_CARE_PROVIDER_SITE_OTHER): Admitting: Family

## 2024-04-12 VITALS — BP 128/78 | HR 67 | Temp 97.4°F | Ht 73.0 in | Wt 209.8 lb

## 2024-04-12 DIAGNOSIS — M2550 Pain in unspecified joint: Secondary | ICD-10-CM | POA: Diagnosis not present

## 2024-04-12 DIAGNOSIS — R300 Dysuria: Secondary | ICD-10-CM | POA: Insufficient documentation

## 2024-04-12 LAB — URINALYSIS, ROUTINE W REFLEX MICROSCOPIC
Bilirubin Urine: NEGATIVE
Hgb urine dipstick: NEGATIVE
Ketones, ur: NEGATIVE
Leukocytes,Ua: NEGATIVE
Nitrite: NEGATIVE
Specific Gravity, Urine: 1.015 (ref 1.000–1.030)
Urine Glucose: NEGATIVE
Urobilinogen, UA: 1 (ref 0.0–1.0)
pH: 6.5 (ref 5.0–8.0)

## 2024-04-12 LAB — URINE CULTURE
MICRO NUMBER:: 16430872
Result:: NO GROWTH
SPECIMEN QUALITY:: ADEQUATE

## 2024-04-12 MED ORDER — SULFAMETHOXAZOLE-TRIMETHOPRIM 800-160 MG PO TABS
1.0000 | ORAL_TABLET | Freq: Two times a day (BID) | ORAL | 0 refills | Status: AC
Start: 2024-04-12 — End: 2024-04-26

## 2024-04-12 NOTE — Assessment & Plan Note (Signed)
 Patient is more active with better control of joint pain; fatigued significantly improved.  Declines further evaluation at this time.  Continue gabapentin  100 mg 3 times daily

## 2024-04-12 NOTE — Assessment & Plan Note (Signed)
 Afebrile.  No systemic symptoms to suggest complicated UTI.  No overt symptoms to suggest prostatitis.  Urinalysis negative white blood cells, red blood cells, nitrites.  Pending urine culture.  D/t  previous history of prostatitis, jointly agreed to start Bactrim  14-day course ahead of urine culture.  May opt to discontinue antibiotic based on patient's symptoms.  He will let me know how he is doing

## 2024-04-12 NOTE — Progress Notes (Signed)
 Assessment & Plan:  Dysuria Assessment & Plan: Afebrile.  No systemic symptoms to suggest complicated UTI.  No overt symptoms to suggest prostatitis.  Urinalysis negative white blood cells, red blood cells, nitrites.  Pending urine culture.  D/t  previous history of prostatitis, jointly agreed to start Bactrim  14-day course ahead of urine culture.  May opt to discontinue antibiotic based on patient's symptoms.  He will let me know how he is doing  Orders: -     Sulfamethoxazole -Trimethoprim ; Take 1 tablet by mouth 2 (two) times daily for 14 days.  Dispense: 28 tablet; Refill: 0  Arthralgia, unspecified joint Assessment & Plan: Patient is more active with better control of joint pain; fatigued significantly improved.  Declines further evaluation at this time.  Continue gabapentin  100 mg 3 times daily      Return precautions given.   Risks, benefits, and alternatives of the medications and treatment plan prescribed today were discussed, and patient expressed understanding.   Education regarding symptom management and diagnosis given to patient on AVS either electronically or printed.  No follow-ups on file.  Bascom Bossier, FNP  Subjective:    Patient ID: Larry Sabin., male    DOB: Dec 02, 1946, 78 y.o.   MRN: [id number]  CC: Larry Sherwin. is a 78 y.o. male who presents today for an acute visit.    HPI: Complains of dysuria x 5 days Urine is foamy.  Describes pain at the tip of his penis.  No rash  Fatigue has significantly improved. Taking gabapentin  100 mg TID.  He is more active now and joints less bothersome    Denies flank pain, chills, fever, general malaise, hematuria, constipation, abdominal pain      Allergies: Ibuprofen Current Outpatient Medications on File Prior to Visit  Medication Sig Dispense Refill   ACCU-CHEK SOFTCLIX LANCETS lancets Use up to 4 times daily to check blood sugar. Diagnosis E11.9 100 each 12   acetaminophen  (TYLENOL ) 500 MG  tablet Take 1,500 mg by mouth 2 (two) times daily.     albuterol  (VENTOLIN  HFA) 108 (90 Base) MCG/ACT inhaler TAKE 2 PUFFS BY MOUTH EVERY 6 HOURS AS NEEDED FOR WHEEZE OR SHORTNESS OF BREATH 1 each 2   amLODipine  (NORVASC ) 10 MG tablet TAKE 1 TABLET BY MOUTH DAILY WITH LUNCH. 90 tablet 3   azelastine  (ASTELIN ) 0.1 % nasal spray Place 1 spray into both nostrils 2 (two) times daily. Use in each nostril as directed 30 mL 4   docusate sodium  (COLACE) 100 MG capsule Take 100 mg by mouth daily.     DULoxetine  (CYMBALTA ) 30 MG capsule TAKE 1 CAPSULE BY MOUTH 2 TIMES DAILY. 180 capsule 2   gabapentin  (NEURONTIN ) 100 MG capsule Take 1 capsule (100 mg total) by mouth 3 (three) times daily. 90 capsule 3   glucose blood (ONETOUCH VERIO) test strip TEST 3 TIMES A DAY 100 each 0   loratadine  (CLARITIN ) 10 MG tablet Take 10 mg by mouth every morning.     losartan  (COZAAR ) 100 MG tablet TAKE 1 TABLET BY MOUTH EVERY DAY 90 tablet 3   metFORMIN  (GLUCOPHAGE ) 500 MG tablet TAKE TABLET BY MOUTH EVERY MORNING WITH A MEAL & TWO TABLETS BY MOUTH EVERY EVENING WITH A MEAL.. 270 tablet 1   metoprolol  succinate (TOPROL -XL) 25 MG 24 hr tablet TAKE 1 TABLET (25 MG TOTAL) BY MOUTH DAILY. 90 tablet 3   pravastatin  (PRAVACHOL ) 40 MG tablet 36 PATIENT IS TAKING M-W-F ONLY 108 tablet 1  tamsulosin  (FLOMAX ) 0.4 MG CAPS capsule Take 2 capsules (0.8 mg total) by mouth daily. 180 capsule 3   traZODone  (DESYREL ) 100 MG tablet Take 1 tablet (100 mg total) by mouth at bedtime. for sleep 90 tablet 3   No current facility-administered medications on file prior to visit.    Review of Systems  Constitutional:  Negative for chills and fever.  Respiratory:  Negative for cough.   Cardiovascular:  Negative for chest pain and palpitations.  Gastrointestinal:  Negative for nausea and vomiting.  Genitourinary:  Positive for dysuria. Negative for difficulty urinating and hematuria.  Musculoskeletal:  Positive for arthralgias.  Skin:  Negative  for rash.      Objective:    BP 128/78   Pulse 67   Temp (!) 97.4 F (36.3 C) (Oral)   Ht 6\' 1"  (1.854 m)   Wt 209 lb 12.8 oz (95.2 kg)   SpO2 98%   BMI 27.68 kg/m   BP Readings from Last 3 Encounters:  04/12/24 128/78  04/02/24 132/78  02/19/24 (!) 189/95   Wt Readings from Last 3 Encounters:  04/12/24 209 lb 12.8 oz (95.2 kg)  04/02/24 205 lb 12.8 oz (93.4 kg)  02/19/24 202 lb (91.6 kg)    Physical Exam Vitals reviewed.  Constitutional:      Appearance: He is well-developed.  Cardiovascular:     Rate and Rhythm: Regular rhythm.     Heart sounds: Normal heart sounds.  Pulmonary:     Effort: Pulmonary effort is normal. No respiratory distress.     Breath sounds: Normal breath sounds. No wheezing, rhonchi or rales.  Abdominal:     Tenderness: There is no abdominal tenderness. There is no right CVA tenderness or left CVA tenderness.     Comments: No suprapubic tenderness.  Abdomen is soft, nondistended  Skin:    General: Skin is warm and dry.  Neurological:     Mental Status: He is alert.  Psychiatric:        Speech: Speech normal.        Behavior: Behavior normal.

## 2024-04-15 ENCOUNTER — Encounter: Payer: Self-pay | Admitting: Family

## 2024-04-17 ENCOUNTER — Ambulatory Visit: Payer: PPO | Admitting: Family

## 2024-04-24 ENCOUNTER — Other Ambulatory Visit: Payer: Self-pay | Admitting: Family

## 2024-04-26 ENCOUNTER — Encounter: Payer: Self-pay | Admitting: Family

## 2024-05-14 ENCOUNTER — Ambulatory Visit

## 2024-05-14 ENCOUNTER — Ambulatory Visit: Admitting: Family

## 2024-05-14 DIAGNOSIS — Z8739 Personal history of other diseases of the musculoskeletal system and connective tissue: Secondary | ICD-10-CM | POA: Diagnosis not present

## 2024-05-14 DIAGNOSIS — E119 Type 2 diabetes mellitus without complications: Secondary | ICD-10-CM | POA: Diagnosis not present

## 2024-05-14 DIAGNOSIS — K219 Gastro-esophageal reflux disease without esophagitis: Secondary | ICD-10-CM | POA: Diagnosis not present

## 2024-05-14 DIAGNOSIS — I10 Essential (primary) hypertension: Secondary | ICD-10-CM | POA: Diagnosis not present

## 2024-05-14 DIAGNOSIS — R809 Proteinuria, unspecified: Secondary | ICD-10-CM | POA: Diagnosis not present

## 2024-05-14 DIAGNOSIS — N200 Calculus of kidney: Secondary | ICD-10-CM | POA: Diagnosis not present

## 2024-05-14 DIAGNOSIS — N281 Cyst of kidney, acquired: Secondary | ICD-10-CM | POA: Diagnosis not present

## 2024-05-14 DIAGNOSIS — N400 Enlarged prostate without lower urinary tract symptoms: Secondary | ICD-10-CM | POA: Diagnosis not present

## 2024-05-14 DIAGNOSIS — E785 Hyperlipidemia, unspecified: Secondary | ICD-10-CM | POA: Diagnosis not present

## 2024-05-22 DIAGNOSIS — M9901 Segmental and somatic dysfunction of cervical region: Secondary | ICD-10-CM | POA: Diagnosis not present

## 2024-05-22 DIAGNOSIS — M9904 Segmental and somatic dysfunction of sacral region: Secondary | ICD-10-CM | POA: Diagnosis not present

## 2024-05-22 DIAGNOSIS — M9905 Segmental and somatic dysfunction of pelvic region: Secondary | ICD-10-CM | POA: Diagnosis not present

## 2024-05-22 DIAGNOSIS — M542 Cervicalgia: Secondary | ICD-10-CM | POA: Diagnosis not present

## 2024-05-22 DIAGNOSIS — M62830 Muscle spasm of back: Secondary | ICD-10-CM | POA: Diagnosis not present

## 2024-05-22 DIAGNOSIS — M9903 Segmental and somatic dysfunction of lumbar region: Secondary | ICD-10-CM | POA: Diagnosis not present

## 2024-05-22 DIAGNOSIS — M955 Acquired deformity of pelvis: Secondary | ICD-10-CM | POA: Diagnosis not present

## 2024-05-22 DIAGNOSIS — M9902 Segmental and somatic dysfunction of thoracic region: Secondary | ICD-10-CM | POA: Diagnosis not present

## 2024-05-22 DIAGNOSIS — M5136 Other intervertebral disc degeneration, lumbar region with discogenic back pain only: Secondary | ICD-10-CM | POA: Diagnosis not present

## 2024-05-22 DIAGNOSIS — M5416 Radiculopathy, lumbar region: Secondary | ICD-10-CM | POA: Diagnosis not present

## 2024-05-27 ENCOUNTER — Telehealth: Payer: Self-pay | Admitting: Family

## 2024-05-27 NOTE — Telephone Encounter (Signed)
    04/12/2024    8:01 AM 04/02/2024   12:23 PM 01/17/2024   10:38 AM  Depression screen PHQ 2/9  Decreased Interest 0 0 0  Down, Depressed, Hopeless 0 0 0  PHQ - 2 Score 0 0 0  Altered sleeping 0 0   Tired, decreased energy 0 0   Change in appetite 0 0   Feeling bad or failure about yourself  0 0   Trouble concentrating 0 0   Moving slowly or fidgety/restless 0 0   Suicidal thoughts 0 0   PHQ-9 Score 0 0   Difficult doing work/chores Not difficult at all Not difficult at all    Medicare formed completed

## 2024-05-30 ENCOUNTER — Encounter: Payer: Self-pay | Admitting: Family

## 2024-05-30 ENCOUNTER — Ambulatory Visit: Admitting: Family

## 2024-05-30 VITALS — BP 128/78 | HR 75 | Temp 97.9°F | Ht 73.0 in | Wt 204.2 lb

## 2024-05-30 DIAGNOSIS — Z7984 Long term (current) use of oral hypoglycemic drugs: Secondary | ICD-10-CM | POA: Diagnosis not present

## 2024-05-30 DIAGNOSIS — K5900 Constipation, unspecified: Secondary | ICD-10-CM

## 2024-05-30 DIAGNOSIS — E119 Type 2 diabetes mellitus without complications: Secondary | ICD-10-CM

## 2024-05-30 DIAGNOSIS — M2550 Pain in unspecified joint: Secondary | ICD-10-CM | POA: Diagnosis not present

## 2024-05-30 DIAGNOSIS — I10 Essential (primary) hypertension: Secondary | ICD-10-CM

## 2024-05-30 MED ORDER — GABAPENTIN 100 MG PO CAPS: 100.0000 mg | ORAL_CAPSULE | Freq: Three times a day (TID) | ORAL | 3 refills | Status: AC

## 2024-05-30 MED ORDER — METFORMIN HCL 500 MG PO TABS: ORAL_TABLET | ORAL | 3 refills | Status: AC

## 2024-05-30 NOTE — Patient Instructions (Signed)
 Constipation plan   start taking MiraLAX, half a dose every day or every other day and change the dose as needed after 3 to 5 days with goal of 1-2 soft bowel movements every day or every other day. MiraLAX is an osmotic laxative. That means it draws water into the colon, which softens the stool and may naturally stimulate the colon to contract. These actions help ease bowel movements. For example, you  may find that using the medication every other day or three times a week is a good bowel regimen for you. Or perhaps, twice weekly.   It is MOST important to drink LOTS of water and follow a HIGH fiber diet to keep foods moving through the gut. You may add Metamucil to a beverage that you drink.  Information on prevention of constipation as well as acute treatment for constipation as included below.  If there is no improvement in your symptoms, or if there is any worsening of symptoms, or if you have any additional concerns, please return to this clinic for re-evaluation; or, if we are closed, consider going to the Emergency Room for evaluation.    Constipation Prevention What is Constipation? Constipation is hard, dry bowel movements or the inability to have a bowel movement.  You can also feel like you need to have a bowel movement but not be able to.  It can also be painful when you strain to have a bowel movement.  Taking narcotic pain medicine after surgery can make you constipated, even if you have never had a problem with constipation. What Do I Need To Do? The best thing to do for constipation is to keep it from happening.  This can be done by: Adding laxatives to your daily routine, when taking prescription pain medicines after surgery. Add 17 gm Miralax daily or 100 mg Colace once or twice daily. (Miralax is mixed in water. Colace is a pill). They soften your bowel movements to make them easier to pass and hurt less. Drink plenty of water to help flush your bowels.  (Eight, 8 ounce  glasses daily) Eat foods high in fiber such as whole grains, vegetables, cereals, fruits, and prune juice (5-7 servings a day or 25 grams).  If you do not know how much fiber a food has in it you can look on the label under "dietary fiber."  If you have trouble getting enough fiber in your diet you may want to consider a fiber supplement such as Metamucil or Citrucel.  Also, be aware that eating fiber without drinking enough water can make constipation worse. If you do become constipated some medications that may help are: Bisacodyl  (Dulcolax) is available in tablet form or a suppository. Glycerin suppositories are also a good choice if you need a fast acting medication. Everybody is different and may have different results.  Talk to your pharmacist or health care provider about your specific problems. They can help you choose the best product for you.  Why Is It Important for Me To Do This? Being constipated is not something you have to live with.  There are many things you can do to help.   Feeling bad can interfere with your recovery after surgery.  If constipation goes on for too long it can become a very serious medical problem. You may need to visit your doctor or go to the hospital.  That is why it is very important to drink lots of water, eat enough fiber, and keep it from happening.  Ask Questions We want to answer all of your questions and concerns.  That's why we encourage you to use a program called Ask Me 3T, created by the [employer].  By using Ask Me 3T you are encouraged to ask 3 simple (yet, potentially life saving questions) whenever you are talking with your physician, nurse or pharmacist: What is my main problem? What do I need to do? Why is it important for me to do this? By understanding the answer to these three questions and any other questions you may have, you have the knowledge necessary to manage your health. Please feel very comfortable  asking any questions. Healthcare is complicated, so if you hear an answer you do not understand, please ask your health care team to explain again.   Sources: [employer] 09-23-10 N

## 2024-05-30 NOTE — Assessment & Plan Note (Signed)
 Chronic, stable. Continue amlodipine  10 mg daily, metoprolol  25 mg daily, losartan  100 mg daily.

## 2024-05-30 NOTE — Assessment & Plan Note (Signed)
  Excellent control. Continue 500mg  metformin  every morning, 1000 mg every evening . Refilled today.

## 2024-05-30 NOTE — Assessment & Plan Note (Signed)
 No alarm features at this time. Advised miralax titration, walking, fiber, and adequate hydration.

## 2024-05-30 NOTE — Assessment & Plan Note (Signed)
 Chronic, stable. Continue gabapentin  100mg  TID

## 2024-05-30 NOTE — Progress Notes (Signed)
 Assessment & Plan:  Controlled type 2 diabetes mellitus without complication, without long-term current use of insulin (HCC) Assessment & Plan:  Excellent control. Continue 500mg  metformin  every morning, 1000 mg every evening . Refilled today.    Arthralgia, unspecified joint Assessment & Plan: Chronic, stable. Continue gabapentin  100mg  TID  Orders: -     Gabapentin ; Take 1 capsule (100 mg total) by mouth 3 (three) times daily.  Dispense: 270 capsule; Refill: 3  Primary hypertension Assessment & Plan: Chronic, stable. Continue amlodipine  10 mg daily, metoprolol  25 mg daily, losartan  100 mg daily.    Constipation, unspecified constipation type Assessment & Plan: No alarm features at this time. Advised miralax titration, walking, fiber, and adequate hydration.    Other orders -     metFORMIN  HCl; TAKE ONE TABLET BY MOUTH EVERY MORNING WITH A MEAL & TWO TABLETS BY MOUTH EVERY EVENING WITH A MEAL.SABRA  Dispense: 270 tablet; Refill: 3     Return precautions given.   Risks, benefits, and alternatives of the medications and treatment plan prescribed today were discussed, and patient expressed understanding.   Education regarding symptom management and diagnosis given to patient on AVS either electronically or printed.  Return in about 4 months (around 09/29/2024).  Rollene Northern, FNP  Subjective:    Patient ID: Larry JINNY Dewey Mickey., male    DOB: 1946-04-14, 78 y.o.   MRN: [id number]  CC: Larry Falotico. is a 78 y.o. male who presents today for follow up.   HPI: He has felt much better on gabapentin  100mg  TID.  Back and shoulder pain has improved.  He doesn't feel sedated on regimen.    Complains of episodic constipation.   He may have two days without BM No blood in the stool, weight loss, abdominal pain, narrow stool.   He is will OTC 'laxative' and then sometimes miralax.   F/u nephrology for proteinuria 05/14/24 No changes to regimen Allergies: Ibuprofen Current  Outpatient Medications on File Prior to Visit  Medication Sig Dispense Refill   ACCU-CHEK SOFTCLIX LANCETS lancets Use up to 4 times daily to check blood sugar. Diagnosis E11.9 100 each 12   acetaminophen  (TYLENOL ) 500 MG tablet Take 1,500 mg by mouth 2 (two) times daily.     albuterol  (VENTOLIN  HFA) 108 (90 Base) MCG/ACT inhaler TAKE 2 PUFFS BY MOUTH EVERY 6 HOURS AS NEEDED FOR WHEEZE OR SHORTNESS OF BREATH 1 each 2   amLODipine  (NORVASC ) 10 MG tablet TAKE 1 TABLET BY MOUTH DAILY WITH LUNCH. 90 tablet 3   azelastine  (ASTELIN ) 0.1 % nasal spray Place 1 spray into both nostrils 2 (two) times daily. Use in each nostril as directed 30 mL 4   docusate sodium  (COLACE) 100 MG capsule Take 100 mg by mouth daily.     DULoxetine  (CYMBALTA ) 30 MG capsule TAKE 1 CAPSULE BY MOUTH TWICE A DAY 180 capsule 0   glucose blood (ONETOUCH VERIO) test strip TEST 3 TIMES A DAY 100 each 0   loratadine  (CLARITIN ) 10 MG tablet Take 10 mg by mouth every morning.     losartan  (COZAAR ) 100 MG tablet TAKE 1 TABLET BY MOUTH EVERY DAY 90 tablet 3   metoprolol  succinate (TOPROL -XL) 25 MG 24 hr tablet TAKE 1 TABLET (25 MG TOTAL) BY MOUTH DAILY. 90 tablet 3   pravastatin  (PRAVACHOL ) 40 MG tablet 36 PATIENT IS TAKING M-W-F ONLY 108 tablet 1   tamsulosin  (FLOMAX ) 0.4 MG CAPS capsule Take 2 capsules (0.8 mg total) by mouth daily. 180  capsule 3   traZODone  (DESYREL ) 100 MG tablet Take 1 tablet (100 mg total) by mouth at bedtime. for sleep 90 tablet 3   No current facility-administered medications on file prior to visit.    Review of Systems  Constitutional:  Negative for chills and fever.  Respiratory:  Negative for cough.   Cardiovascular:  Negative for chest pain and palpitations.  Gastrointestinal:  Positive for constipation. Negative for abdominal pain, blood in stool, nausea and vomiting.      Objective:    BP 128/78   Pulse 75   Temp 97.9 F (36.6 C) (Oral)   Ht 6' 1 (1.854 m)   Wt 204 lb 3.2 oz (92.6 kg)   SpO2  95%   BMI 26.94 kg/m  BP Readings from Last 3 Encounters:  05/30/24 128/78  04/12/24 128/78  04/02/24 132/78   Wt Readings from Last 3 Encounters:  05/30/24 204 lb 3.2 oz (92.6 kg)  04/12/24 209 lb 12.8 oz (95.2 kg)  04/02/24 205 lb 12.8 oz (93.4 kg)    Physical Exam Vitals reviewed.  Constitutional:      Appearance: He is well-developed.   Cardiovascular:     Rate and Rhythm: Regular rhythm.     Heart sounds: Normal heart sounds.  Pulmonary:     Effort: Pulmonary effort is normal. No respiratory distress.     Breath sounds: Normal breath sounds. No wheezing, rhonchi or rales.   Skin:    General: Skin is warm and dry.   Neurological:     Mental Status: He is alert.   Psychiatric:        Speech: Speech normal.        Behavior: Behavior normal.

## 2024-06-11 DIAGNOSIS — M9901 Segmental and somatic dysfunction of cervical region: Secondary | ICD-10-CM | POA: Diagnosis not present

## 2024-06-11 DIAGNOSIS — M9903 Segmental and somatic dysfunction of lumbar region: Secondary | ICD-10-CM | POA: Diagnosis not present

## 2024-06-11 DIAGNOSIS — M9904 Segmental and somatic dysfunction of sacral region: Secondary | ICD-10-CM | POA: Diagnosis not present

## 2024-06-11 DIAGNOSIS — M62830 Muscle spasm of back: Secondary | ICD-10-CM | POA: Diagnosis not present

## 2024-06-11 DIAGNOSIS — M5136 Other intervertebral disc degeneration, lumbar region with discogenic back pain only: Secondary | ICD-10-CM | POA: Diagnosis not present

## 2024-06-11 DIAGNOSIS — M542 Cervicalgia: Secondary | ICD-10-CM | POA: Diagnosis not present

## 2024-06-11 DIAGNOSIS — M9905 Segmental and somatic dysfunction of pelvic region: Secondary | ICD-10-CM | POA: Diagnosis not present

## 2024-06-11 DIAGNOSIS — M5416 Radiculopathy, lumbar region: Secondary | ICD-10-CM | POA: Diagnosis not present

## 2024-06-11 DIAGNOSIS — M955 Acquired deformity of pelvis: Secondary | ICD-10-CM | POA: Diagnosis not present

## 2024-06-11 DIAGNOSIS — M9902 Segmental and somatic dysfunction of thoracic region: Secondary | ICD-10-CM | POA: Diagnosis not present

## 2024-06-17 ENCOUNTER — Encounter: Payer: Self-pay | Admitting: Family

## 2024-06-18 ENCOUNTER — Ambulatory Visit
Admission: RE | Admit: 2024-06-18 | Discharge: 2024-06-18 | Disposition: A | Discharge status: Home / Self Care | Source: Ambulatory Visit | Attending: Emergency Medicine | Admitting: Emergency Medicine

## 2024-06-18 VITALS — BP 151/79 | HR 71 | Temp 98.1°F | Resp 18

## 2024-06-18 DIAGNOSIS — H6122 Impacted cerumen, left ear: Secondary | ICD-10-CM

## 2024-06-18 NOTE — ED Triage Notes (Signed)
 Patient reports fullness in left ear x 1 week.

## 2024-06-18 NOTE — ED Provider Notes (Signed)
 Larry Roman    CSN: [id number] Arrival date & time: 06/18/24  1341      History   Chief Complaint Chief Complaint  Patient presents with   Ear Fullness    Left ear clogged . - Entered by patient    HPI Larry Roman. is a 78 y.o. male.   Patient presents for evaluation of left-sided ear fullness and decreased hearing present for 7 days.  Wears bilateral hearing aids.  Denies ear drainage, fever or congestion.  Has attempted use of Debrox drops at least 3-4 times with only minimal relief.  Past Medical History:  Diagnosis Date   Arthritis    Depression    DM (diabetes mellitus) (HCC)    Extrinsic asthma, unspecified    childhood   GERD (gastroesophageal reflux disease)    rare   History of kidney stones    Hives of unknown origin    HTN (hypertension)    Other allergy, other than to medicinal agents    RBBB    Sleep apnea    25 years ago mild lost weight no longer uses cpap   Wound of right leg 08/2018   area just below knee size of quarter, reddened around edges    Patient Active Problem List   Diagnosis Date Noted   Constipation 05/30/2024   Dysuria 04/12/2024   Arthralgia 04/02/2024   Prostatitis 01/11/2024   Diarrhea 01/10/2024   Bronchitis 08/26/2022   Headache 06/10/2022   Proteinuria 09/13/2021   Nasal congestion 12/21/2020   Encounter for long-term (current) use of aspirin  09/21/2020   HLD (hyperlipidemia) 09/21/2020   S/P TKR (total knee replacement) using cement, 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   History of gout 08/07/2019   Other fatigue 12/17/2018   Primary osteoarthritis of right knee 10/13/2018   S/P TKR (total knee replacement) using cement, right 09/27/2018   OSA (obstructive sleep apnea) 04/16/2018   Tremor 01/17/2018   Acute left-sided low back pain without sciatica 11/10/2017   BPH (benign prostatic hyperplasia) 01/10/2017   Chronic back pain 08/19/2016   Chronic pain of both knees 09/14/2015   Erectile dysfunction 09/10/2012   Right  bundle branch block 09/10/2012   Hypertension 11/10/2011   Insomnia 11/10/2011   Diabetes mellitus type 2, controlled (HCC) 11/10/2011   Depression, recurrent (HCC) 05/13/2009    Past Surgical History:  Procedure Laterality Date   BACK SURGERY  1995   ruptured disc encapsulated nerves   CATARACT EXTRACTION, BILATERAL  2016   Dr. Francesco at [facility]   CYSTOSCOPY/URETEROSCOPY/HOLMIUM LASER/STENT PLACEMENT Left 08/24/2021   Procedure: CYSTOSCOPY/URETEROSCOPY/HOLMIUM LASER/STENT PLACEMENT;  Surgeon: Twylla Glendia BROCKS, MD;  Location: [facility];  Service: Urology;  Laterality: Left;   EYE SURGERY Bilateral    cataract extractions   JOINT REPLACEMENT Right    total knee   TONSILLECTOMY     TONSILLECTOMY     TOTAL KNEE ARTHROPLASTY Right 09/27/2018   Procedure: TOTAL KNEE ARTHROPLASTY;  Surgeon: Marchia Drivers, MD;  Location: [facility];  Service: Orthopedics;  Laterality: Right;   TOTAL KNEE ARTHROPLASTY Left 08/15/2019   Procedure: TOTAL KNEE ARTHROPLASTY;  Surgeon: Marchia Drivers, MD;  Location: [facility];  Service: Orthopedics;  Laterality: Left;       Home Medications    Prior to Admission medications   Medication Sig Start Date End Date Taking? Authorizing Provider  ACCU-CHEK SOFTCLIX LANCETS lancets Use up to 4 times daily to check blood sugar. Diagnosis E11.9 10/22/16   Cook, Jayce G, DO  acetaminophen  (  TYLENOL ) 500 MG tablet Take 1,500 mg by mouth 2 (two) times daily.    [provider]  albuterol  (VENTOLIN  HFA) 108 (90 Base) MCG/ACT inhaler TAKE 2 PUFFS BY MOUTH EVERY 6 HOURS AS NEEDED FOR WHEEZE OR SHORTNESS OF BREATH 11/14/23   Dineen Rollene MATSU, FNP  amLODipine  (NORVASC ) 10 MG tablet TAKE 1 TABLET BY MOUTH DAILY WITH LUNCH. 01/08/24   Dineen Rollene MATSU, FNP  azelastine  (ASTELIN ) 0.1 % nasal spray Place 1 spray into both nostrils 2 (two) times daily. Use in each nostril as directed 01/10/24   Dineen Rollene MATSU, FNP  docusate sodium  (COLACE) 100 MG capsule  Take 100 mg by mouth daily.    [provider]  DULoxetine  (CYMBALTA ) 30 MG capsule TAKE 1 CAPSULE BY MOUTH TWICE A DAY 04/24/24   Dineen Rollene MATSU, FNP  gabapentin  (NEURONTIN ) 100 MG capsule Take 1 capsule (100 mg total) by mouth 3 (three) times daily. 05/30/24   Dineen Rollene MATSU, FNP  glucose blood (ONETOUCH VERIO) test strip TEST 3 TIMES A DAY 10/15/18   Landy Barnie RAMAN, NP  loratadine  (CLARITIN ) 10 MG tablet Take 10 mg by mouth every morning.    [provider]  losartan  (COZAAR ) 100 MG tablet TAKE 1 TABLET BY MOUTH EVERY DAY 07/31/23   Dineen Rollene MATSU, FNP  metFORMIN  (GLUCOPHAGE ) 500 MG tablet TAKE ONE TABLET BY MOUTH EVERY MORNING WITH A MEAL & TWO TABLETS BY MOUTH EVERY EVENING WITH A MEAL.. 05/30/24   Dineen Rollene MATSU, FNP  metoprolol  succinate (TOPROL -XL) 25 MG 24 hr tablet TAKE 1 TABLET (25 MG TOTAL) BY MOUTH DAILY. 07/10/23   Dineen Rollene MATSU, FNP  pravastatin  (PRAVACHOL ) 40 MG tablet 36 PATIENT IS TAKING M-W-F ONLY 12/11/23   Dineen Rollene MATSU, FNP  tamsulosin  (FLOMAX ) 0.4 MG CAPS capsule Take 2 capsules (0.8 mg total) by mouth daily. 02/19/24   Stoioff, Glendia BROCKS, MD  traZODone  (DESYREL ) 100 MG tablet Take 1 tablet (100 mg total) by mouth at bedtime. for sleep 10/19/23   Dineen Rollene MATSU, FNP    Family History Family History  Problem Relation Age of Onset   Heart failure Father    Heart attack Mother     Social History Social History   Tobacco Use   Smoking status: Never   Smokeless tobacco: Never   Tobacco comments:    tobacco use - no  Vaping Use   Vaping status: Never Used  Substance Use Topics   Alcohol use: Yes    Comment: 6   Drug use: No     Allergies   Ibuprofen   Review of Systems Review of Systems   Physical Exam Triage Vital Signs ED Triage Vitals  Encounter Vitals Group     BP 06/18/24 1403 (!) 151/79     Girls Systolic BP Percentile --      Girls Diastolic BP Percentile --      Boys Systolic BP Percentile --       Boys Diastolic BP Percentile --      Pulse Rate 06/18/24 1403 71     Resp 06/18/24 1403 18     Temp 06/18/24 1403 98.1 F (36.7 C)     Temp Source 06/18/24 1403 Oral     SpO2 06/18/24 1403 95 %     Weight --      Height --      Head Circumference --      Peak Flow --      Pain Score 06/18/24 1404  0     Pain Loc --      Pain Education --      Exclude from Growth Chart --    No data found.  Updated Vital Signs BP (!) 151/79 (BP Location: Left Arm)   Pulse 71   Temp 98.1 F (36.7 C) (Oral)   Resp 18   SpO2 95%   Visual Acuity Right Eye Distance:   Left Eye Distance:   Bilateral Distance:    Right Eye Near:   Left Eye Near:    Bilateral Near:     Physical Exam Constitutional:      Appearance: Normal appearance.  HENT:     Right Ear: Tympanic membrane, ear canal and external ear normal.     Left Ear: There is impacted cerumen.  Eyes:     Extraocular Movements: Extraocular movements intact.  Pulmonary:     Effort: Pulmonary effort is normal.  Neurological:     Mental Status: He is alert and oriented to person, place, and time.      UC Treatments / Results  Labs (all labs ordered are listed, but only abnormal results are displayed) Labs Reviewed - No data to display  EKG   Radiology No results found.  Procedures Procedures (including critical care time)  Medications Ordered in UC Medications - No data to display  Initial Impression / Assessment and Plan / UC Course  I have reviewed the triage vital signs and the nursing notes.  Pertinent labs & imaging results that were available during my care of the patient were reviewed by me and considered in my medical decision making (see chart for details).  Infected cerumen of left ear  Impaction noted, water irrigation completed, tolerated well, successful, no signs of infection on reevaluation advise continue use of Debrox drops as needed with follow-up as needed Final Clinical Impressions(s) / UC  Diagnoses   Final diagnoses:  Impacted cerumen of left ear     Discharge Instructions      Today you were treated for ear fullness due to buildup of wax, ears have been irrigated with water  Moving forward you may use over-the-counter Debrox drops to help thin secretions making it easier to clean   may follow-up with his urgent care as needed if fullness recurs    ED Prescriptions   None    PDMP not reviewed this encounter.   Teresa Shelba SAUNDERS, NP 06/18/24 1513

## 2024-06-18 NOTE — Discharge Instructions (Addendum)
Today you were treated for ear fullness due to buildup of wax, ears have been irrigated with water  Moving forward you may use over-the-counter Debrox drops to help thin secretions making it easier to clean   may follow-up with his urgent care as needed if fullness recurs

## 2024-07-05 ENCOUNTER — Other Ambulatory Visit: Payer: Self-pay | Admitting: Family

## 2024-07-05 DIAGNOSIS — I10 Essential (primary) hypertension: Secondary | ICD-10-CM

## 2024-07-09 DIAGNOSIS — M9904 Segmental and somatic dysfunction of sacral region: Secondary | ICD-10-CM | POA: Diagnosis not present

## 2024-07-09 DIAGNOSIS — M62830 Muscle spasm of back: Secondary | ICD-10-CM | POA: Diagnosis not present

## 2024-07-09 DIAGNOSIS — M9902 Segmental and somatic dysfunction of thoracic region: Secondary | ICD-10-CM | POA: Diagnosis not present

## 2024-07-09 DIAGNOSIS — M9903 Segmental and somatic dysfunction of lumbar region: Secondary | ICD-10-CM | POA: Diagnosis not present

## 2024-07-09 DIAGNOSIS — M5136 Other intervertebral disc degeneration, lumbar region with discogenic back pain only: Secondary | ICD-10-CM | POA: Diagnosis not present

## 2024-07-09 DIAGNOSIS — M955 Acquired deformity of pelvis: Secondary | ICD-10-CM | POA: Diagnosis not present

## 2024-07-09 DIAGNOSIS — M9905 Segmental and somatic dysfunction of pelvic region: Secondary | ICD-10-CM | POA: Diagnosis not present

## 2024-07-09 DIAGNOSIS — M542 Cervicalgia: Secondary | ICD-10-CM | POA: Diagnosis not present

## 2024-07-09 DIAGNOSIS — M9901 Segmental and somatic dysfunction of cervical region: Secondary | ICD-10-CM | POA: Diagnosis not present

## 2024-07-09 DIAGNOSIS — M5416 Radiculopathy, lumbar region: Secondary | ICD-10-CM | POA: Diagnosis not present

## 2024-07-18 ENCOUNTER — Other Ambulatory Visit: Payer: Self-pay | Admitting: Family

## 2024-08-06 DIAGNOSIS — M9902 Segmental and somatic dysfunction of thoracic region: Secondary | ICD-10-CM | POA: Diagnosis not present

## 2024-08-06 DIAGNOSIS — M955 Acquired deformity of pelvis: Secondary | ICD-10-CM | POA: Diagnosis not present

## 2024-08-06 DIAGNOSIS — M9901 Segmental and somatic dysfunction of cervical region: Secondary | ICD-10-CM | POA: Diagnosis not present

## 2024-08-06 DIAGNOSIS — M9904 Segmental and somatic dysfunction of sacral region: Secondary | ICD-10-CM | POA: Diagnosis not present

## 2024-08-06 DIAGNOSIS — M9905 Segmental and somatic dysfunction of pelvic region: Secondary | ICD-10-CM | POA: Diagnosis not present

## 2024-08-06 DIAGNOSIS — M9903 Segmental and somatic dysfunction of lumbar region: Secondary | ICD-10-CM | POA: Diagnosis not present

## 2024-08-06 DIAGNOSIS — M542 Cervicalgia: Secondary | ICD-10-CM | POA: Diagnosis not present

## 2024-08-06 DIAGNOSIS — M5136 Other intervertebral disc degeneration, lumbar region with discogenic back pain only: Secondary | ICD-10-CM | POA: Diagnosis not present

## 2024-08-06 DIAGNOSIS — M5416 Radiculopathy, lumbar region: Secondary | ICD-10-CM | POA: Diagnosis not present

## 2024-08-06 DIAGNOSIS — M62830 Muscle spasm of back: Secondary | ICD-10-CM | POA: Diagnosis not present

## 2024-08-09 ENCOUNTER — Other Ambulatory Visit: Payer: Self-pay | Admitting: Family

## 2024-08-27 DIAGNOSIS — M62830 Muscle spasm of back: Secondary | ICD-10-CM | POA: Diagnosis not present

## 2024-08-27 DIAGNOSIS — M5416 Radiculopathy, lumbar region: Secondary | ICD-10-CM | POA: Diagnosis not present

## 2024-08-27 DIAGNOSIS — M9903 Segmental and somatic dysfunction of lumbar region: Secondary | ICD-10-CM | POA: Diagnosis not present

## 2024-08-27 DIAGNOSIS — M5136 Other intervertebral disc degeneration, lumbar region with discogenic back pain only: Secondary | ICD-10-CM | POA: Diagnosis not present

## 2024-09-10 ENCOUNTER — Ambulatory Visit: Admitting: Physician Assistant

## 2024-09-10 VITALS — BP 157/87 | HR 75 | Ht 72.0 in | Wt 205.0 lb

## 2024-09-10 DIAGNOSIS — G8929 Other chronic pain: Secondary | ICD-10-CM

## 2024-09-10 DIAGNOSIS — N4889 Other specified disorders of penis: Secondary | ICD-10-CM | POA: Diagnosis not present

## 2024-09-10 DIAGNOSIS — Z87440 Personal history of urinary (tract) infections: Secondary | ICD-10-CM | POA: Diagnosis not present

## 2024-09-10 LAB — URINALYSIS, COMPLETE
Bilirubin, UA: NEGATIVE
Glucose, UA: NEGATIVE
Leukocytes,UA: NEGATIVE
Nitrite, UA: NEGATIVE
RBC, UA: NEGATIVE
Specific Gravity, UA: 1.025 (ref 1.005–1.030)
Urobilinogen, Ur: 0.2 mg/dL (ref 0.2–1.0)
pH, UA: 6 (ref 5.0–7.5)

## 2024-09-10 LAB — MICROSCOPIC EXAMINATION

## 2024-09-10 NOTE — Progress Notes (Signed)
 09/10/2024 10:46 AM   [name] 01-01-1946 [id number]  CC: Chief Complaint  Patient presents with   Dysuria   HPI: Larry Roman. is a 78 y.o. male with PMH CKD with proteinuria, BPH on Flomax  0.8 mg daily, diabetes, and chronic back pain on gabapentin  and Cymbalta  who presents today for evaluation of chronic penile pain.   Today he reports 9 to 10 months of chronic burning at the penile corona.  It was worse last week, but somewhat improved this week.  Burning is not associated with voiding and does not fluctuate throughout the day.  He denies rashes, discharge, difficulty voiding, or weak/split stream.  In-office UA today positive for 2+ protein and trace ketones; urine microscopy pan negative.  PMH: Past Medical History:  Diagnosis Date   Arthritis    Depression    DM (diabetes mellitus) (HCC)    Extrinsic asthma, unspecified    childhood   GERD (gastroesophageal reflux disease)    rare   History of kidney stones    Hives of unknown origin    HTN (hypertension)    Other allergy, other than to medicinal agents    RBBB    Sleep apnea    25 years ago mild lost weight no longer uses cpap   Wound of right leg 08/2018   area just below knee size of quarter, reddened around edges    Surgical History: Past Surgical History:  Procedure Laterality Date   BACK SURGERY  1995   ruptured disc encapsulated nerves   CATARACT EXTRACTION, BILATERAL  2016   [facility] Francesco at [facility]   CYSTOSCOPY/URETEROSCOPY/HOLMIUM LASER/STENT PLACEMENT Left 08/24/2021   Procedure: CYSTOSCOPY/URETEROSCOPY/HOLMIUM LASER/STENT PLACEMENT;  Surgeon: Twylla Glendia BROCKS, MD;  Location: [facility];  Service: Urology;  Laterality: Left;   EYE SURGERY Bilateral    cataract extractions   JOINT REPLACEMENT Right    total knee   TONSILLECTOMY     TONSILLECTOMY     TOTAL KNEE ARTHROPLASTY Right 09/27/2018   Procedure: TOTAL KNEE ARTHROPLASTY;  Surgeon: Marchia Drivers, MD;  Location:  [facility];  Service: Orthopedics;  Laterality: Right;   TOTAL KNEE ARTHROPLASTY Left 08/15/2019   Procedure: TOTAL KNEE ARTHROPLASTY;  Surgeon: Marchia Drivers, MD;  Location: [facility];  Service: Orthopedics;  Laterality: Left;    Home Medications:  Allergies as of 09/10/2024       Reactions   Ibuprofen Other (See Comments)   Ibuprofen and motrin causes bp to elevate        Medication List        Accurate as of September 10, 2024 10:46 AM. If you have any questions, ask your nurse or doctor.          Accu-Chek Softclix Lancets lancets Use up to 4 times daily to check blood sugar. Diagnosis E11.9   acetaminophen  500 MG tablet Commonly known as: TYLENOL  Take 1,500 mg by mouth 2 (two) times daily.   albuterol  108 (90 Base) MCG/ACT inhaler Commonly known as: VENTOLIN  HFA TAKE 2 PUFFS BY MOUTH EVERY 6 HOURS AS NEEDED FOR WHEEZE OR SHORTNESS OF BREATH   amLODipine  10 MG tablet Commonly known as: NORVASC  TAKE 1 TABLET BY MOUTH DAILY WITH LUNCH.   azelastine  0.1 % nasal spray Commonly known as: ASTELIN  Place 1 spray into both nostrils 2 (two) times daily. Use in each nostril as directed   docusate sodium  100 MG capsule Commonly known as: COLACE Take 100 mg by mouth daily.   DULoxetine   30 MG capsule Commonly known as: CYMBALTA  TAKE 1 CAPSULE BY MOUTH TWICE A DAY   gabapentin  100 MG capsule Commonly known as: NEURONTIN  Take 1 capsule (100 mg total) by mouth 3 (three) times daily.   glucose blood test strip Commonly known as: OneTouch Verio TEST 3 TIMES A DAY   loratadine  10 MG tablet Commonly known as: CLARITIN  Take 10 mg by mouth every morning.   losartan  100 MG tablet Commonly known as: COZAAR  TAKE 1 TABLET BY MOUTH EVERY DAY   metFORMIN  500 MG tablet Commonly known as: GLUCOPHAGE  TAKE ONE TABLET BY MOUTH EVERY MORNING WITH A MEAL & TWO TABLETS BY MOUTH EVERY EVENING WITH A MEAL..   metoprolol  succinate 25 MG 24 hr tablet Commonly known as: TOPROL -XL TAKE  1 TABLET (25 MG TOTAL) BY MOUTH DAILY.   pravastatin  40 MG tablet Commonly known as: PRAVACHOL  36 PATIENT IS TAKING M-W-F ONLY   tamsulosin  0.4 MG Caps capsule Commonly known as: FLOMAX  Take 2 capsules (0.8 mg total) by mouth daily.   traZODone  100 MG tablet Commonly known as: DESYREL  Take 1 tablet (100 mg total) by mouth at bedtime. for sleep        Allergies:  Allergies  Allergen Reactions   Ibuprofen Other (See Comments)    Ibuprofen and motrin causes bp to elevate    Family History: Family History  Problem Relation Age of Onset   Heart failure Father    Heart attack Mother     Social History:   reports that he has never smoked. He has never used smokeless tobacco. He reports current alcohol use. He reports that he does not use drugs.  Physical Exam: BP (!) 157/87 (BP Location: Left Arm, Patient Position: Sitting, Cuff Size: Normal)   Pulse 75   Ht 6' (1.829 m)   Wt 205 lb (93 kg)   SpO2 96%   BMI 27.80 kg/m   Constitutional:  Alert and oriented, no acute distress, nontoxic appearing HEENT: [place], AT Cardiovascular: No clubbing, cyanosis, or edema Respiratory: Normal respiratory effort, no increased work of breathing GU: Meatal stenosis.  Penile glans is intact with no erythema or rashes. Skin: No rashes, bruises or suspicious lesions Neurologic: Grossly intact, no focal deficits, moving all 4 extremities Psychiatric: Normal mood and affect  Laboratory Data: Results for orders placed or performed in visit on 09/10/24  Microscopic Examination   Collection Time: 09/10/24  9:45 AM   Urine  Result Value Ref Range   WBC, UA 0-5 0 - 5 /hpf   RBC, Urine 0-2 0 - 2 /hpf   Epithelial Cells (non renal) 0-10 0 - 10 /hpf   Casts Present (A) None seen /lpf   Cast Type Hyaline casts N/A   Crystals Present (A) N/A   Crystal Type Amorphous Sediment N/A   Bacteria, UA Few None seen/Few  Urinalysis, Complete   Collection Time: 09/10/24  9:45 AM  Result Value Ref Range    Specific Gravity, UA 1.025 1.005 - 1.030   pH, UA 6.0 5.0 - 7.5   Color, UA Yellow Yellow   Appearance Ur Clear Clear   Leukocytes,UA Negative Negative   Protein,UA 2+ (A) Negative/Trace   Glucose, UA Negative Negative   Ketones, UA Trace (A) Negative   RBC, UA Negative Negative   Bilirubin, UA Negative Negative   Urobilinogen, Ur 0.2 0.2 - 1.0 mg/dL   Nitrite, UA Negative Negative   Microscopic Examination See below:    Assessment & Plan:   1. Penile  pain, chronic (Primary) No significant findings on physical exam.  Differential includes radicular back pain or chronic inflammatory prostatitis with pain radiating to the glans penis.  Unfortunately, regardless I have nothing to offer him.  He is already on maximum dose Flomax  and I cannot give him anti-inflammatories due to his CKD.  I also cannot offer him amitriptyline since he is already on Cymbalta  and gabapentin .  I recommended managing his symptoms with topical Orajel for symptom relief. - Urinalysis, Complete  Return if symptoms worsen or fail to improve.  Lucie Hones, PA-C  [facility] [place] [address], [address] [place], [place] [address] [phone]

## 2024-09-13 DIAGNOSIS — G8929 Other chronic pain: Secondary | ICD-10-CM | POA: Diagnosis not present

## 2024-09-13 DIAGNOSIS — M25511 Pain in right shoulder: Secondary | ICD-10-CM | POA: Diagnosis not present

## 2024-09-17 DIAGNOSIS — M5416 Radiculopathy, lumbar region: Secondary | ICD-10-CM | POA: Diagnosis not present

## 2024-09-17 DIAGNOSIS — M9903 Segmental and somatic dysfunction of lumbar region: Secondary | ICD-10-CM | POA: Diagnosis not present

## 2024-09-17 DIAGNOSIS — M5136 Other intervertebral disc degeneration, lumbar region with discogenic back pain only: Secondary | ICD-10-CM | POA: Diagnosis not present

## 2024-09-17 DIAGNOSIS — M62830 Muscle spasm of back: Secondary | ICD-10-CM | POA: Diagnosis not present

## 2024-09-23 DIAGNOSIS — G8929 Other chronic pain: Secondary | ICD-10-CM | POA: Diagnosis not present

## 2024-09-23 DIAGNOSIS — M25511 Pain in right shoulder: Secondary | ICD-10-CM | POA: Diagnosis not present

## 2024-09-27 DIAGNOSIS — Z0189 Encounter for other specified special examinations: Secondary | ICD-10-CM | POA: Diagnosis not present

## 2024-09-27 DIAGNOSIS — M19011 Primary osteoarthritis, right shoulder: Secondary | ICD-10-CM | POA: Diagnosis not present

## 2024-09-30 ENCOUNTER — Telehealth: Payer: Self-pay | Admitting: Cardiovascular Disease

## 2024-09-30 NOTE — Telephone Encounter (Signed)
   Pre-operative Risk Assessment    Patient Name: Larry Roman.  DOB: 1946/06/09 MRN: [id number]   Date of last office visit: 12/29/2020 Date of next office visit: n/a   Request for Surgical Clearance    Procedure:  Rt TSA Reverse  Date of Surgery:  Clearance TBD                                Surgeon:  Lynwood Cools, MD Surgeon's Group or Practice Name:  [facility] Phone number:  [phone] Fax number:  [phone]   Type of Clearance Requested:   - Medical    Type of Anesthesia:  Local mack   Additional requests/questions:    [name] [employer]   09/30/2024, 9:56 AM

## 2024-09-30 NOTE — Telephone Encounter (Signed)
   Name: Larry Roman.  DOB: 04-Jan-1946  MRN: [id number]  Primary Cardiologist: None  Chart reviewed as part of pre-operative protocol coverage. The patient has an upcoming visit scheduled with Dr. Argentina on 10/15/2024 at which time clearance can be addressed in case there are any issues that would impact surgical recommendations.  RT TSA reverse is not scheduled until TBD as below. I added preop FYI to appointment note so that provider is aware to address at time of outpatient visit.  Per office protocol the cardiology provider should forward their finalized clearance decision and recommendations regarding antiplatelet therapy to the requesting party below.    I will route this message as FYI to requesting party and remove this message from the preop box as separate preop APP input not needed at this time.   Please call with any questions.  Lum LITTIE Louis, NP  09/30/2024, 11:30 AM

## 2024-10-01 ENCOUNTER — Ambulatory Visit: Admitting: Family

## 2024-10-01 ENCOUNTER — Encounter: Payer: Self-pay | Admitting: Family

## 2024-10-01 VITALS — BP 130/70 | HR 63 | Temp 98.3°F | Ht 73.0 in | Wt 211.4 lb

## 2024-10-01 DIAGNOSIS — N400 Enlarged prostate without lower urinary tract symptoms: Secondary | ICD-10-CM

## 2024-10-01 DIAGNOSIS — J309 Allergic rhinitis, unspecified: Secondary | ICD-10-CM

## 2024-10-01 DIAGNOSIS — I10 Essential (primary) hypertension: Secondary | ICD-10-CM

## 2024-10-01 DIAGNOSIS — G8929 Other chronic pain: Secondary | ICD-10-CM | POA: Diagnosis not present

## 2024-10-01 DIAGNOSIS — Z7984 Long term (current) use of oral hypoglycemic drugs: Secondary | ICD-10-CM

## 2024-10-01 DIAGNOSIS — Z23 Encounter for immunization: Secondary | ICD-10-CM

## 2024-10-01 DIAGNOSIS — M549 Dorsalgia, unspecified: Secondary | ICD-10-CM | POA: Diagnosis not present

## 2024-10-01 DIAGNOSIS — E119 Type 2 diabetes mellitus without complications: Secondary | ICD-10-CM

## 2024-10-01 DIAGNOSIS — R899 Unspecified abnormal finding in specimens from other organs, systems and tissues: Secondary | ICD-10-CM | POA: Diagnosis not present

## 2024-10-01 LAB — BASIC METABOLIC PANEL WITH GFR
BUN: 15 mg/dL (ref 6–23)
CO2: 30 meq/L (ref 19–32)
Calcium: 10.1 mg/dL (ref 8.4–10.5)
Chloride: 99 meq/L (ref 96–112)
Creatinine, Ser: 0.77 mg/dL (ref 0.40–1.50)
GFR: 86.52 mL/min (ref >60.00)
Glucose, Bld: 104 mg/dL — ABNORMAL HIGH (ref 70–99)
Potassium: 4.2 meq/L (ref 3.5–5.1)
Sodium: 137 meq/L (ref 135–145)

## 2024-10-01 LAB — HEMOGLOBIN A1C: Hgb A1c MFr Bld: 6.3 % (ref 4.6–6.5)

## 2024-10-01 LAB — LIPID PANEL
Cholesterol: 126 mg/dL (ref 0–200)
HDL: 47.3 mg/dL (ref >39.00)
LDL Cholesterol: 45 mg/dL (ref 0–99)
NonHDL: 78.59
Total CHOL/HDL Ratio: 3
Triglycerides: 169 mg/dL — ABNORMAL HIGH (ref 0.0–149.0)
VLDL: 33.8 mg/dL (ref 0.0–40.0)

## 2024-10-01 MED ORDER — FLUTICASONE PROPIONATE 50 MCG/ACT NA SUSP: 2.0000 | Freq: Every day | NASAL | 6 refills | Status: AC

## 2024-10-01 NOTE — Progress Notes (Signed)
 Assessment & Plan:  Diabetes mellitus type 2, controlled (HCC) Assessment & Plan: Anticipate stable. Pending a1c Continue 500mg  metformin  every morning, 1000 mg every evening .    Abnormal laboratory test -     Basic metabolic panel with GFR  Controlled type 2 diabetes mellitus without complication, without long-term current use of insulin (HCC) -     Lipid panel -     Hemoglobin A1c  Need for influenza vaccination -     Flu vaccine HIGH DOSE PF(Fluzone Trivalent)  Allergic rhinitis, unspecified seasonality, unspecified trigger -     Fluticasone  Propionate; Place 2 sprays into both nostrils daily.  Dispense: 16 g; Refill: 6  Chronic back pain, unspecified back location, unspecified back pain laterality Assessment & Plan: Chronic, stable. Continue tylenol  650mg  and gabapentin  100mg  BID. Larry Roman politely declines repeat lumbar xray from 2019. Larry Roman will let me know if symptoms change, worsen.    Primary hypertension Assessment & Plan: Chronic, stable. Improved after resting in exam room.  Continue amlodipine  10 mg daily, metoprolol  25 mg daily, losartan  100 mg daily.    Benign prostatic hyperplasia, unspecified whether lower urinary tract symptoms present Assessment & Plan: Penile pain resolved. No overt urinary symptoms at this time. Back pain at baseline.  Larry Roman will continue to follow with urology. Will follow      Return precautions given.   Risks, benefits, and alternatives of the medications and treatment plan prescribed today were discussed, and patient expressed understanding.   Education regarding symptom management and diagnosis given to patient on AVS either electronically or printed.  No follow-ups on file.  Rollene Northern, FNP  Subjective:    Patient ID: Larry JINNY Dewey Mickey., male    DOB: October 06, 1946, 78 y.o.   MRN: [id number]  CC: Larry Rout. is a 78 y.o. male who presents today for follow up.   HPI: HPI Discussed the use of AI scribe software for clinical  note transcription with the patient, who gave verbal consent to proceed.  History of Present Illness   Larry Thum. is a 78 year old male with chronic kidney disease and neuropathic pain who presents for a follow-up visit.  Larry Roman is currently taking gabapentin  100 mg three times a day for neuropathic pain, which is effective. Denies grogginess or mental sluggishness.   Larry Roman has chronic kidney disease and is on losartan  for renal protection. His appointment with his nephrologist was rescheduled to December. Larry Roman has significant proteinuria and chronic kidney disease. Larry Roman avoids NSAIDs  Larry Roman takes Tylenol  arthritis twice a day and uses topical treatments for pain management. Back pain is well controlled at this time. Denies groin pain, numbness in back or legs.   Larry Roman visited a urologist for burning on his penis, which has since subsided. Larry Roman primarily drinks lemon water, which seems to help.   Denies dysuria.    Larry Roman remains active, doing yard work and helping his son with tasks like cleaning out the garage.  Denies CP, sob  Following with Dr Dominica for DM, proteinuria, hypercalcemia  Allergies: Ibuprofen Current Outpatient Medications on File Prior to Visit  Medication Sig Dispense Refill   ACCU-CHEK SOFTCLIX LANCETS lancets Use up to 4 times daily to check blood sugar. Diagnosis E11.9 100 each 12   acetaminophen  (TYLENOL ) 500 MG tablet Take 1,500 mg by mouth 2 (two) times daily.     albuterol  (VENTOLIN  HFA) 108 (90 Base) MCG/ACT inhaler TAKE 2 PUFFS BY MOUTH EVERY 6 HOURS AS NEEDED FOR  WHEEZE OR SHORTNESS OF BREATH 1 each 2   amLODipine  (NORVASC ) 10 MG tablet TAKE 1 TABLET BY MOUTH DAILY WITH LUNCH. 90 tablet 3   azelastine  (ASTELIN ) 0.1 % nasal spray Place 1 spray into both nostrils 2 (two) times daily. Use in each nostril as directed 30 mL 4   docusate sodium  (COLACE) 100 MG capsule Take 100 mg by mouth daily.     DULoxetine  (CYMBALTA ) 30 MG capsule TAKE 1 CAPSULE BY MOUTH TWICE A DAY 180  capsule 0   gabapentin  (NEURONTIN ) 100 MG capsule Take 1 capsule (100 mg total) by mouth 3 (three) times daily. 270 capsule 3   glucose blood (ONETOUCH VERIO) test strip TEST 3 TIMES A DAY 100 each 0   loratadine  (CLARITIN ) 10 MG tablet Take 10 mg by mouth every morning.     losartan  (COZAAR ) 100 MG tablet TAKE 1 TABLET BY MOUTH EVERY DAY 90 tablet 3   metFORMIN  (GLUCOPHAGE ) 500 MG tablet TAKE ONE TABLET BY MOUTH EVERY MORNING WITH A MEAL & TWO TABLETS BY MOUTH EVERY EVENING WITH A MEAL.. 270 tablet 3   metoprolol  succinate (TOPROL -XL) 25 MG 24 hr tablet TAKE 1 TABLET (25 MG TOTAL) BY MOUTH DAILY. 90 tablet 3   pravastatin  (PRAVACHOL ) 40 MG tablet 36 PATIENT IS TAKING M-W-F ONLY 108 tablet 1   tamsulosin  (FLOMAX ) 0.4 MG CAPS capsule Take 2 capsules (0.8 mg total) by mouth daily. 180 capsule 3   traZODone  (DESYREL ) 100 MG tablet Take 1 tablet (100 mg total) by mouth at bedtime. for sleep 90 tablet 3   No current facility-administered medications on file prior to visit.    Review of Systems  Constitutional:  Negative for chills and fever.  Respiratory:  Negative for cough.   Cardiovascular:  Negative for chest pain and palpitations.  Gastrointestinal:  Negative for nausea and vomiting.  Genitourinary:  Positive for penile pain (resolved). Negative for difficulty urinating, dysuria and urgency.  Musculoskeletal:  Positive for back pain (not bothersome at this time).      Objective:    BP 130/70 Comment: right arm  Pulse 63   Temp 98.3 F (36.8 C) (Oral)   Ht 6' 1 (1.854 m)   Wt 211 lb 6.4 oz (95.9 kg)   SpO2 95%   BMI 27.89 kg/m  BP Readings from Last 3 Encounters:  10/01/24 130/70  09/10/24 (!) 157/87  06/18/24 (!) 151/79   Wt Readings from Last 3 Encounters:  10/01/24 211 lb 6.4 oz (95.9 kg)  09/10/24 205 lb (93 kg)  05/30/24 204 lb 3.2 oz (92.6 kg)    Physical Exam Vitals reviewed.  Constitutional:      Appearance: Larry Roman is well-developed.  Cardiovascular:     Rate and  Rhythm: Regular rhythm.     Heart sounds: Normal heart sounds.  Pulmonary:     Effort: Pulmonary effort is normal. No respiratory distress.     Breath sounds: Normal breath sounds. No wheezing, rhonchi or rales.  Skin:    General: Skin is warm and dry.  Neurological:     Mental Status: Larry Roman is alert.  Psychiatric:        Speech: Speech normal.        Behavior: Behavior normal.

## 2024-10-01 NOTE — Assessment & Plan Note (Signed)
 Chronic, stable. Improved after resting in exam room.  Continue amlodipine  10 mg daily, metoprolol  25 mg daily, losartan  100 mg daily.

## 2024-10-01 NOTE — Assessment & Plan Note (Signed)
 Anticipate stable. Pending a1c Continue 500mg  metformin  every morning, 1000 mg every evening .

## 2024-10-01 NOTE — Assessment & Plan Note (Signed)
 Penile pain resolved. No overt urinary symptoms at this time. Back pain at baseline.  He will continue to follow with urology. Will follow

## 2024-10-01 NOTE — Assessment & Plan Note (Signed)
 Chronic, stable. Continue tylenol  650mg  and gabapentin  100mg  BID. He politely declines repeat lumbar xray from 2019. He will let me know if symptoms change, worsen.

## 2024-10-02 ENCOUNTER — Ambulatory Visit: Payer: Self-pay | Admitting: Family

## 2024-10-08 DIAGNOSIS — M5416 Radiculopathy, lumbar region: Secondary | ICD-10-CM | POA: Diagnosis not present

## 2024-10-08 DIAGNOSIS — Z0189 Encounter for other specified special examinations: Secondary | ICD-10-CM | POA: Diagnosis not present

## 2024-10-08 DIAGNOSIS — M62830 Muscle spasm of back: Secondary | ICD-10-CM | POA: Diagnosis not present

## 2024-10-08 DIAGNOSIS — M5136 Other intervertebral disc degeneration, lumbar region with discogenic back pain only: Secondary | ICD-10-CM | POA: Diagnosis not present

## 2024-10-08 DIAGNOSIS — M9903 Segmental and somatic dysfunction of lumbar region: Secondary | ICD-10-CM | POA: Diagnosis not present

## 2024-10-11 NOTE — Progress Notes (Signed)
  Cardiology Office Note   Date:  10/15/2024  ID:  Larry Roman., DOB October 08, 1946, MRN [id number] PCP: Dineen Rollene MATSU, FNP  [facility] Providers Cardiologist:  Caron Poser, MD     History of Present Illness Larry Roman. is a 78 y.o. male PMH DM2, HLD, HTN who presents for perioperative risk stratification for an upcoming right reverse total shoulder arthroplasty.  Patient reports he is overall doing well.  He denies any dyspnea, chest discomfort, palpitations, orthopnea, syncope, or any other high risk cardiovascular symptoms.  He is able to get around very well without limitation.  He can climb up 2 flights of stairs with ease.  Last LDL 45 09/2024.  Relevant CVD History - Asymptomatic RBBB dating back to at least 2019   ROS: Pt denies any chest discomfort, jaw pain, arm pain, palpitations, syncope, presyncope, orthopnea, PND, or LE edema.  Studies Reviewed I have independently reviewed the patient's ECG, previous medical records, previous blood work.  Physical Exam VS:  BP 120/64 (BP Location: Left Arm, Patient Position: Sitting, Cuff Size: Normal)   Pulse 65   Ht 6' (1.829 m)   Wt 210 lb (95.3 kg)   SpO2 98%   BMI 28.48 kg/m        Wt Readings from Last 3 Encounters:  10/15/24 210 lb (95.3 kg)  10/01/24 211 lb 6.4 oz (95.9 kg)  09/10/24 205 lb (93 kg)    GEN: No acute distress. NECK: No JVD; No carotid bruits. CARDIAC: RRR, no murmurs, rubs, gallops. RESPIRATORY:  Clear to auscultation. EXTREMITIES:  Warm and well-perfused. No edema.  ASSESSMENT AND PLAN Perioperative cardiovascular risk assessment RCRI score of 0 indicating that he is low perioperative risk for low risk operation. He can easily perform greater than 4 METS.  Given these findings, no further cardiac testing or treatment is indicated.  HLD Last LDL 45 09/2024.  Well-controlled.  Continue pravastatin  40 mg MWF only.  Asymptomatic RBBB ECG shows normal sinus rhythm with right  bundle branch block.  This has been present since at least 2019.  As above, he is asymptomatic from this.  We can go ahead and obtain an echocardiogram to rule out any structural causes.  This does not need to hold up his surgery since he is otherwise asymptomatic and low risk.        Dispo: RTC as needed  Signed, Caron Poser, MD

## 2024-10-15 ENCOUNTER — Ambulatory Visit

## 2024-10-15 VITALS — BP 120/64 | HR 65 | Ht 72.0 in | Wt 210.0 lb

## 2024-10-15 DIAGNOSIS — E782 Mixed hyperlipidemia: Secondary | ICD-10-CM

## 2024-10-15 DIAGNOSIS — Z01810 Encounter for preprocedural cardiovascular examination: Secondary | ICD-10-CM

## 2024-10-15 DIAGNOSIS — I4510 Unspecified right bundle-branch block: Secondary | ICD-10-CM

## 2024-10-15 NOTE — Patient Instructions (Signed)
 Medication Instructions:  Your physician recommends that you continue on your current medications as directed. Please refer to the Current Medication list given to you today.  *If you need a refill on your cardiac medications before your next appointment, please call your pharmacy*  Lab Work: No labs ordered today  If you have labs (blood work) drawn today and your tests are completely normal, you will receive your results only by: MyChart Message (if you have MyChart) OR A paper copy in the mail If you have any lab test that is abnormal or we need to change your treatment, we will call you to review the results.  Testing/Procedures:  Your physician has requested that you have an echocardiogram. Echocardiography is a painless test that uses sound waves to create images of your heart. It provides your doctor with information about the size and shape of your heart and how well your heart's chambers and valves are working.   You may receive an ultrasound enhancing agent through an IV if needed to better visualize your heart during the echo. This procedure takes approximately one hour.  There are no restrictions for this procedure.  This will take place at [address] [facility] [address], [place] [address]  Please note: We ask at that you not bring children with you during ultrasound (echo/ vascular) testing. Due to room size and safety concerns, children are not allowed in the ultrasound rooms during exams. Our front office staff cannot provide observation of children in our lobby area while testing is being conducted. An adult accompanying a patient to their appointment will only be allowed in the ultrasound room at the discretion of the ultrasound technician under special circumstances. We apologize for any inconvenience.   Follow-Up: At [facility], you and your health needs are our priority.  As part of our continuing mission to provide you with exceptional heart  care, our providers are all part of one team.  This team includes your primary Cardiologist (physician) and Advanced Practice Providers or APPs (Physician Assistants and Nurse Practitioners) who all work together to provide you with the care you need, when you need it.  Your next appointment:   As needed    Provider:   You may see Caron Poser, MD or one of the following Advanced Practice Providers on your designated Care Team:   Lonni Meager, NP Lesley Maffucci, PA-C Bernardino Bring, PA-C Cadence Laona, PA-C Tylene Lunch, NP Barnie Hila, NP   We recommend signing up for the patient portal called MyChart.  Sign up information is provided on this After Visit Summary.  MyChart is used to connect with patients for Virtual Visits (Telemedicine).  Patients are able to view lab/test results, encounter notes, upcoming appointments, etc.  Non-urgent messages can be sent to your provider as well.   To learn more about what you can do with MyChart, go to [email or web address].

## 2024-10-16 ENCOUNTER — Other Ambulatory Visit: Payer: Self-pay | Admitting: Family

## 2024-10-24 ENCOUNTER — Other Ambulatory Visit: Payer: Self-pay | Admitting: Family

## 2024-10-24 DIAGNOSIS — F339 Major depressive disorder, recurrent, unspecified: Secondary | ICD-10-CM

## 2024-11-06 DIAGNOSIS — G8918 Other acute postprocedural pain: Secondary | ICD-10-CM | POA: Diagnosis not present

## 2024-11-06 DIAGNOSIS — I129 Hypertensive chronic kidney disease with stage 1 through stage 4 chronic kidney disease, or unspecified chronic kidney disease: Secondary | ICD-10-CM | POA: Diagnosis not present

## 2024-11-06 DIAGNOSIS — M25711 Osteophyte, right shoulder: Secondary | ICD-10-CM | POA: Diagnosis not present

## 2024-11-06 DIAGNOSIS — M12811 Other specific arthropathies, not elsewhere classified, right shoulder: Secondary | ICD-10-CM | POA: Diagnosis not present

## 2024-11-06 DIAGNOSIS — M75121 Complete rotator cuff tear or rupture of right shoulder, not specified as traumatic: Secondary | ICD-10-CM | POA: Diagnosis not present

## 2024-11-06 DIAGNOSIS — M19011 Primary osteoarthritis, right shoulder: Secondary | ICD-10-CM | POA: Diagnosis not present

## 2024-11-06 DIAGNOSIS — Z6827 Body mass index (BMI) 27.0-27.9, adult: Secondary | ICD-10-CM | POA: Diagnosis not present

## 2024-11-06 DIAGNOSIS — M75101 Unspecified rotator cuff tear or rupture of right shoulder, not specified as traumatic: Secondary | ICD-10-CM | POA: Diagnosis not present

## 2024-11-06 DIAGNOSIS — Z96653 Presence of artificial knee joint, bilateral: Secondary | ICD-10-CM | POA: Diagnosis not present

## 2024-11-06 DIAGNOSIS — E669 Obesity, unspecified: Secondary | ICD-10-CM | POA: Diagnosis not present

## 2024-11-06 DIAGNOSIS — N400 Enlarged prostate without lower urinary tract symptoms: Secondary | ICD-10-CM | POA: Diagnosis not present

## 2024-11-06 DIAGNOSIS — F419 Anxiety disorder, unspecified: Secondary | ICD-10-CM | POA: Diagnosis not present

## 2024-11-06 DIAGNOSIS — E7800 Pure hypercholesterolemia, unspecified: Secondary | ICD-10-CM | POA: Diagnosis not present

## 2024-11-06 DIAGNOSIS — Z7984 Long term (current) use of oral hypoglycemic drugs: Secondary | ICD-10-CM | POA: Diagnosis not present

## 2024-11-06 DIAGNOSIS — N189 Chronic kidney disease, unspecified: Secondary | ICD-10-CM | POA: Diagnosis not present

## 2024-11-06 DIAGNOSIS — E1122 Type 2 diabetes mellitus with diabetic chronic kidney disease: Secondary | ICD-10-CM | POA: Diagnosis not present

## 2024-11-06 DIAGNOSIS — F32A Depression, unspecified: Secondary | ICD-10-CM | POA: Diagnosis not present

## 2024-11-13 DIAGNOSIS — M25611 Stiffness of right shoulder, not elsewhere classified: Secondary | ICD-10-CM | POA: Diagnosis not present

## 2024-11-13 DIAGNOSIS — M25511 Pain in right shoulder: Secondary | ICD-10-CM | POA: Diagnosis not present

## 2024-11-26 ENCOUNTER — Other Ambulatory Visit: Payer: Self-pay | Admitting: *Deleted

## 2024-11-26 DIAGNOSIS — N200 Calculus of kidney: Secondary | ICD-10-CM

## 2024-12-11 ENCOUNTER — Encounter: Payer: Self-pay | Admitting: Family

## 2024-12-11 NOTE — Telephone Encounter (Signed)
 Pt has appt scheduled.

## 2024-12-17 ENCOUNTER — Ambulatory Visit
Admission: RE | Admit: 2024-12-17 | Discharge: 2024-12-17 | Disposition: A | Discharge status: Home / Self Care | Source: Ambulatory Visit | Attending: Urology | Admitting: Urology

## 2024-12-17 ENCOUNTER — Ambulatory Visit
Admission: RE | Admit: 2024-12-17 | Discharge: 2024-12-17 | Disposition: A | Source: Ambulatory Visit | Attending: Urology | Admitting: Urology

## 2024-12-17 DIAGNOSIS — N200 Calculus of kidney: Secondary | ICD-10-CM | POA: Diagnosis present

## 2024-12-18 ENCOUNTER — Encounter: Payer: Self-pay | Admitting: Urology

## 2024-12-18 ENCOUNTER — Ambulatory Visit: Payer: Self-pay | Admitting: Urology

## 2024-12-18 VITALS — BP 176/84 | HR 59 | Ht 72.0 in | Wt 205.0 lb

## 2024-12-18 DIAGNOSIS — N4889 Other specified disorders of penis: Secondary | ICD-10-CM | POA: Diagnosis not present

## 2024-12-18 DIAGNOSIS — N400 Enlarged prostate without lower urinary tract symptoms: Secondary | ICD-10-CM

## 2024-12-18 DIAGNOSIS — G8929 Other chronic pain: Secondary | ICD-10-CM

## 2024-12-18 DIAGNOSIS — N200 Calculus of kidney: Secondary | ICD-10-CM | POA: Diagnosis not present

## 2024-12-18 NOTE — Progress Notes (Signed)
 "  12/18/2024 10:14 AM   Larry Roman. May 07, 1946 [id number]  Referring provider: Dineen Rollene MATSU, FNP [address] [place],  [place] [address]  Chief Complaint  Patient presents with   Nephrolithiasis   Urologic history:   1.  BPH with LUTS Tamsulosin  daily   2.  Nephrolithiasis CT 07/19/2021 with 14 mm left proximal ureteral calculus/hydronephrosis and bilateral nonobstructing renal calculi Left ureteroscopy/stone removal 08/24/2021; stone impacted Declined metabolic evaluation   3.  Bilateral renal cysts   HPI: Larry Roman. is a 79 y.o. male presents for follow-up visit.  PA visit October 2025 with complaints of pain glans penis felt most likely related to inflammatory prostatitis.  He was already on tamsulosin  and could not take amitriptyline secondary to Cymbalta  Pain has improved and is intermittent Urologic ROS otherwise negative States his PCP at [facility] in [place] recommended starting half dose of Jardiance and 1 my opinion on starting and I thought it would be fine to start   PMH: Past Medical History:  Diagnosis Date   Arthritis    Chronic kidney disease 2022   Ckd   Depression    DM (diabetes mellitus) (HCC)    Extrinsic asthma, unspecified    childhood   GERD (gastroesophageal reflux disease)    rare   History of kidney stones    Hives of unknown origin    HTN (hypertension)    Other allergy, other than to medicinal agents    RBBB    Sleep apnea    25 years ago mild lost weight no longer uses cpap   Wound of right leg 08/2018   area just below knee size of quarter, reddened around edges    Surgical History: Past Surgical History:  Procedure Laterality Date   BACK SURGERY  1995   ruptured disc encapsulated nerves   CATARACT EXTRACTION, BILATERAL  2016   Dr. Francesco at [facility]   CYSTOSCOPY/URETEROSCOPY/HOLMIUM LASER/STENT PLACEMENT Left 08/24/2021   Procedure: CYSTOSCOPY/URETEROSCOPY/HOLMIUM LASER/STENT  PLACEMENT;  Surgeon: Twylla Glendia BROCKS, MD;  Location: [facility] ORS;  Service: Urology;  Laterality: Left;   EYE SURGERY Bilateral    cataract extractions   JOINT REPLACEMENT Right    total knee   SPINE SURGERY     TONSILLECTOMY     TONSILLECTOMY     TOTAL KNEE ARTHROPLASTY Right 09/27/2018   Procedure: TOTAL KNEE ARTHROPLASTY;  Surgeon: Marchia Drivers, MD;  Location: [facility] ORS;  Service: Orthopedics;  Laterality: Right;   TOTAL KNEE ARTHROPLASTY Left 08/15/2019   Procedure: TOTAL KNEE ARTHROPLASTY;  Surgeon: Marchia Drivers, MD;  Location: [facility] ORS;  Service: Orthopedics;  Laterality: Left;    Home Medications:  Allergies as of 12/18/2024       Reactions   Ibuprofen Other (See Comments)   Ibuprofen and motrin causes bp to elevate        Medication List        Accurate as of December 18, 2024 10:14 AM. If you have any questions, ask your nurse or doctor.          STOP taking these medications    docusate sodium  100 MG capsule Commonly known as: COLACE       TAKE these medications    Accu-Chek Softclix Lancets lancets Use up to 4 times daily to check blood sugar. Diagnosis E11.9   acetaminophen  500 MG tablet Commonly known as: TYLENOL  Take 1,500 mg by mouth 2 (two) times daily.   albuterol  108 (90 Base) MCG/ACT  inhaler Commonly known as: VENTOLIN  HFA TAKE 2 PUFFS BY MOUTH EVERY 6 HOURS AS NEEDED FOR WHEEZE OR SHORTNESS OF BREATH   amLODipine  10 MG tablet Commonly known as: NORVASC  TAKE 1 TABLET BY MOUTH DAILY WITH LUNCH.   azelastine  0.1 % nasal spray Commonly known as: ASTELIN  Place 1 spray into both nostrils 2 (two) times daily. Use in each nostril as directed   DULoxetine  30 MG capsule Commonly known as: CYMBALTA  TAKE 1 CAPSULE BY MOUTH TWICE A DAY   empagliflozin 25 MG Tabs tablet Commonly known as: JARDIANCE Take 12.5 mg by mouth.   fluticasone  50 MCG/ACT nasal spray Commonly known as: FLONASE  Place 2 sprays into both nostrils daily.    gabapentin  100 MG capsule Commonly known as: NEURONTIN  Take 1 capsule (100 mg total) by mouth 3 (three) times daily.   glucose blood test strip Commonly known as: OneTouch Verio TEST 3 TIMES A DAY   loratadine  10 MG tablet Commonly known as: CLARITIN  Take 10 mg by mouth every morning.   losartan  100 MG tablet Commonly known as: COZAAR  TAKE 1 TABLET BY MOUTH EVERY DAY   metFORMIN  500 MG tablet Commonly known as: GLUCOPHAGE  TAKE ONE TABLET BY MOUTH EVERY MORNING WITH A MEAL & TWO TABLETS BY MOUTH EVERY EVENING WITH A MEAL..   metoprolol  succinate 25 MG 24 hr tablet Commonly known as: TOPROL -XL TAKE 1 TABLET (25 MG TOTAL) BY MOUTH DAILY.   pravastatin  40 MG tablet Commonly known as: PRAVACHOL  36 PATIENT IS TAKING M-W-F ONLY   tamsulosin  0.4 MG Caps capsule Commonly known as: FLOMAX  Take 2 capsules (0.8 mg total) by mouth daily.   traZODone  100 MG tablet Commonly known as: DESYREL  TAKE 1 TABLET (100 MG TOTAL) BY MOUTH AT BEDTIME. FOR SLEEP        Allergies: Allergies[1]  Family History: Family History  Problem Relation Age of Onset   Heart failure Father    Heart disease Father        MJM Sr   Hypertension Father    Heart attack Mother    Asthma Mother    Heart disease Mother     Social History:  reports that he has never smoked. He has never used smokeless tobacco. He reports current alcohol use. He reports that he does not use drugs.   Physical Exam: BP (!) 176/84   Pulse (!) 59   Ht 6' (1.829 m)   Wt 205 lb (93 kg)   BMI 27.80 kg/m   Constitutional:  Alert, No acute distress. HEENT: [place] AT Respiratory: Normal respiratory effort, no increased work of breathing. Psychiatric: Normal mood and affect.   Pertinent Imaging: KUB performed 12/17/2024 was personally reviewed and interpreted.  There is a 3 mm calcification overlying the midportion of the left renal outline   Assessment & Plan:    1.  BPH with LUTS Stable on tamsulosin   2.   Nephrolithiasis Bilateral nephrolithiasis on prior CT.  3 mm calcification visualized on today's KUB 1 year follow-up with KUB  3.  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

## 2024-12-28 ENCOUNTER — Other Ambulatory Visit: Payer: Self-pay | Admitting: Family

## 2024-12-28 DIAGNOSIS — I10 Essential (primary) hypertension: Secondary | ICD-10-CM

## 2024-12-30 ENCOUNTER — Other Ambulatory Visit: Payer: Self-pay | Admitting: Family

## 2024-12-30 DIAGNOSIS — E785 Hyperlipidemia, unspecified: Secondary | ICD-10-CM

## 2025-01-13 ENCOUNTER — Ambulatory Visit: Admitting: Family

## 2025-02-11 ENCOUNTER — Ambulatory Visit: Admitting: Family

## 2025-12-19 ENCOUNTER — Ambulatory Visit: Admitting: Urology
